# Patient Record
Sex: Male | Born: 1965 | Race: White | Hispanic: No | Marital: Married | Smoking: Never smoker
Health system: Southern US, Community
[De-identification: ages and names within clinical notes are randomized; demographics above are authoritative.]

## PROBLEM LIST (undated history)

## (undated) DIAGNOSIS — R0600 Dyspnea, unspecified: Secondary | ICD-10-CM

## (undated) DIAGNOSIS — N186 End stage renal disease: Secondary | ICD-10-CM

## (undated) DIAGNOSIS — A0472 Enterocolitis due to Clostridium difficile, not specified as recurrent: Secondary | ICD-10-CM

## (undated) DIAGNOSIS — I509 Heart failure, unspecified: Secondary | ICD-10-CM

## (undated) DIAGNOSIS — K219 Gastro-esophageal reflux disease without esophagitis: Secondary | ICD-10-CM

## (undated) DIAGNOSIS — D649 Anemia, unspecified: Secondary | ICD-10-CM

## (undated) DIAGNOSIS — Z9289 Personal history of other medical treatment: Secondary | ICD-10-CM

## (undated) DIAGNOSIS — J189 Pneumonia, unspecified organism: Secondary | ICD-10-CM

## (undated) DIAGNOSIS — M009 Pyogenic arthritis, unspecified: Secondary | ICD-10-CM

## (undated) DIAGNOSIS — I82409 Acute embolism and thrombosis of unspecified deep veins of unspecified lower extremity: Secondary | ICD-10-CM

## (undated) HISTORY — DX: Enterocolitis due to Clostridium difficile, not specified as recurrent: A04.72

## (undated) HISTORY — PX: EYE SURGERY: SHX253

## (undated) HISTORY — DX: Gastro-esophageal reflux disease without esophagitis: K21.9

## (undated) HISTORY — DX: Acute embolism and thrombosis of unspecified deep veins of unspecified lower extremity: I82.409

## (undated) HISTORY — DX: Pyogenic arthritis, unspecified: M00.9

## (undated) HISTORY — PX: OTHER SURGICAL HISTORY: SHX169

## (undated) HISTORY — DX: Heart failure, unspecified: I50.9

---

## 1993-09-05 HISTORY — PX: APPENDECTOMY: SHX54

## 2008-04-18 DIAGNOSIS — A0472 Enterocolitis due to Clostridium difficile, not specified as recurrent: Secondary | ICD-10-CM

## 2008-04-18 HISTORY — DX: Enterocolitis due to Clostridium difficile, not specified as recurrent: A04.72

## 2008-10-15 ENCOUNTER — Ambulatory Visit: Payer: Self-pay | Admitting: Family Medicine

## 2010-10-05 NOTE — Assessment & Plan Note (Signed)
Summary: FLU SHOT/WB  Nurse Visit    Flu Vaccine Consent Questions     Do you have a history of severe allergic reactions to this vaccine? no    Any prior history of allergic reactions to egg and/or gelatin? no    Do you have a sensitivity to the preservative Thimersol? no    Do you have a past history of Guillan-Barre Syndrome? no    Do you currently have an acute febrile illness? no    Have you ever had a severe reaction to latex? no    Vaccine information given and explained to patient? yes    Are you currently pregnant? no    Lot Number:[id number]   Exp Date:03/04/2009   Site Given  Right Deltoid IM Georgiann Mccoy  October 15, 2008 3:14 PM          Orders Added: 1)  Admin 1st Vaccine [id number] 2)  Flu Vaccine 62yrs + [place].Seats    ]

## 2012-01-03 ENCOUNTER — Ambulatory Visit (INDEPENDENT_AMBULATORY_CARE_PROVIDER_SITE_OTHER): Payer: 59 | Admitting: Family Medicine

## 2012-01-03 ENCOUNTER — Encounter: Payer: Self-pay | Admitting: Family Medicine

## 2012-01-03 VITALS — BP 140/88 | HR 97 | Ht 71.0 in | Wt 201.0 lb

## 2012-01-03 DIAGNOSIS — I5022 Chronic systolic (congestive) heart failure: Secondary | ICD-10-CM | POA: Insufficient documentation

## 2012-01-03 DIAGNOSIS — E1129 Type 2 diabetes mellitus with other diabetic kidney complication: Secondary | ICD-10-CM | POA: Insufficient documentation

## 2012-01-03 DIAGNOSIS — L97509 Non-pressure chronic ulcer of other part of unspecified foot with unspecified severity: Secondary | ICD-10-CM

## 2012-01-03 DIAGNOSIS — E119 Type 2 diabetes mellitus without complications: Secondary | ICD-10-CM

## 2012-01-03 DIAGNOSIS — I509 Heart failure, unspecified: Secondary | ICD-10-CM

## 2012-01-03 LAB — POCT GLYCOSYLATED HEMOGLOBIN (HGB A1C): Hemoglobin A1C: >14

## 2012-01-03 MED ORDER — SULFAMETHOXAZOLE-TRIMETHOPRIM 800-160 MG PO TABS: 1.0000 | ORAL_TABLET | Freq: Two times a day (BID) | ORAL | Status: AC

## 2012-01-03 MED ORDER — INSULIN DETEMIR 100 UNIT/ML ~~LOC~~ SOLN: 20.0000 [IU] | Freq: Every day | SUBCUTANEOUS | Status: DC

## 2012-01-03 MED ORDER — INSULIN PEN NEEDLE 31G X 5 MM MISC: Status: DC

## 2012-01-03 MED ORDER — GLIPIZIDE 10 MG PO TABS: 10.0000 mg | ORAL_TABLET | Freq: Two times a day (BID) | ORAL | Status: DC

## 2012-01-03 MED ORDER — METFORMIN HCL 1000 MG PO TABS: 1000.0000 mg | ORAL_TABLET | Freq: Two times a day (BID) | ORAL | Status: DC

## 2012-01-03 MED ORDER — AMBULATORY NON FORMULARY MEDICATION: Status: DC

## 2012-01-03 NOTE — Progress Notes (Signed)
Subjective:    Patient ID: James Burke, male    DOB: 27-Jul-1966, 46 y.o.   MRN: [id number]  HPI Wound on foot for about a month.  Found blood blister, not sure what caused it. Never hurt or bothered him. Then developed a scab, started to remove the scab and sawa a lot of discharge. Has been cleaning it and applying neosporin.  Then finally Sunday started to get painful.  Yesterday severe pain.  Hx of DM dx in 2007.  Glipizide and metformin for years.  Off the meds for about a year. Says has tried to eat really well since being off the medications. He has not been checking his blood sugars. He denies any polyuria or polydipsia. No fever..    Review of Systems  Constitutional: Negative for fever, diaphoresis and unexpected weight change.  HENT: Negative for hearing loss, rhinorrhea, sneezing and tinnitus.   Eyes: Negative for visual disturbance.  Respiratory: Negative for cough and wheezing.   Cardiovascular: Negative for chest pain and palpitations.  Gastrointestinal: Negative for nausea, vomiting, diarrhea and blood in stool.  Genitourinary: Negative for dysuria and discharge.  Musculoskeletal: Negative for myalgias and arthralgias.  Skin: Negative for rash.  Neurological: Negative for headaches.  Hematological: Negative for adenopathy.  Psychiatric/Behavioral: Negative for sleep disturbance and dysphoric mood. The patient is not nervous/anxious.    BP 140/88  Pulse 97  Ht 5\' 11"  (1.803 m)  Wt 201 lb (91.173 kg)  BMI 28.03 kg/m2    No Known Allergies  Past Medical History  Diagnosis Date  . Diabetes mellitus   . Congestive heart failure     Past Surgical History  Procedure Date  . Appendectomy 1995    History   Social History  . Marital Status: Married    Spouse Name: N/A    Number of Children: N/A  . Years of Education: N/A   Occupational History  . Not on file.   Social History Main Topics  . Smoking status: Never Smoker   . Smokeless tobacco: Not on file  .  Alcohol Use: No  . Drug Use: No  . Sexually Active: Yes   Other Topics Concern  . Not on file   Social History Narrative  . No narrative on file    History reviewed. No pertinent family history.  Outpatient Encounter Prescriptions as of 01/03/2012  Medication Sig Dispense Refill  . aspirin 81 MG tablet Take 81 mg by mouth daily.      . carvedilol (COREG) 25 MG tablet Take 25 mg by mouth. Take two pills twice daily      . lisinopril-hydrochlorothiazide (PRINZIDE,ZESTORETIC) 20-12.5 MG per tablet Take 1 tablet by mouth daily.      . AMBULATORY NON FORMULARY MEDICATION Medication Name: glucometer, strips and lancet Dx 250.02 Test twice a day.  100 Units  pRN  . glipiZIDE (GLUCOTROL) 10 MG tablet Take 1 tablet (10 mg total) by mouth 2 (two) times daily before a meal.  90 tablet  0  . insulin detemir (LEVEMIR) 100 UNIT/ML injection Inject 20 Units into the skin at bedtime.  9 mL  12  . Insulin Pen Needle 31G X 5 MM MISC Needles for levemir.  100 each  11  . metFORMIN (GLUCOPHAGE) 1000 MG tablet Take 1 tablet (1,000 mg total) by mouth 2 (two) times daily with a meal.  180 tablet  0  . sulfamethoxazole-trimethoprim (BACTRIM DS,SEPTRA DS) 800-160 MG per tablet Take 1 tablet by mouth 2 (two) times daily.  28 tablet  0          Objective:   Physical Exam  Constitutional: He is oriented to person, place, and time. He appears well-developed and well-nourished.  HENT:  Head: Normocephalic and atraumatic.  Cardiovascular: Normal rate, regular rhythm and normal heart sounds.   Pulmonary/Chest: Effort normal and breath sounds normal.  Neurological: He is alert and oriented to person, place, and time.  Skin: Skin is warm and dry.       Left heal with a a very larger callus.  approx 6 cm large.  I debrided the area and underneath with soft white pale skin.  In the center is 8 mm ulcer that is about 3 mm deep.  Unable to express any pus but it does have a moist looks.  AFter debridement duoderm  was applied and then coban to keep it in place.  He also has some surrounding erythema around the callus. It is still warm to touch.  Psychiatric: He has a normal mood and affect. His behavior is normal.          Assessment & Plan:  Foot ulcer- I apply DuoDERM to the wound after debridement. Patient tolerated this well. I'll have him followup in 3 days to make sure that it's improving. I also put him on Bactrim since there was surrounding erythema with increased warmth around the wound. Consider that this could be tracking to the bone and if it is not getting better, or certainly if he is getting worse, or if pain worsens then consider further imaging to rule out osteomyelitis. Right now the depth of the ulcer is only about 3 mm. I encouraged him to stay off the foot as much as possible. He declined crutches.  DM-uncontrolled. We discussed that an A1c over 14 mean sneezing running over 300 on a daily basis. We will restart his abdomen as well as his glipizide. I also started him on Levemir. I gave him a sample pen and demonstrated how to use it properly. He administered his first 10 units here in the office today. I will give him a prescription for glucometer, lancets, strips, and needles for his pain. He will followup on Friday. I did ask him to increase by 1 unit today until his fasting sugars in the morning are under 130. He is to administer his Levemir at bedtime. Lab Results  Component Value Date   HGBA1C >14 01/03/2012

## 2012-01-03 NOTE — Patient Instructions (Addendum)
Increase Lantus by one unit today and telling her morning sugar is under 130. Tomorrow you will give yourselt 11 units.  Keep wrapping dry. Followup on Friday for me to recheck your wound.

## 2012-01-06 ENCOUNTER — Encounter: Payer: Self-pay | Admitting: Family Medicine

## 2012-01-06 ENCOUNTER — Ambulatory Visit (INDEPENDENT_AMBULATORY_CARE_PROVIDER_SITE_OTHER): Payer: 59 | Admitting: Family Medicine

## 2012-01-06 VITALS — BP 120/78 | HR 87 | Ht 71.0 in | Wt 203.0 lb

## 2012-01-06 DIAGNOSIS — L97509 Non-pressure chronic ulcer of other part of unspecified foot with unspecified severity: Secondary | ICD-10-CM

## 2012-01-06 MED ORDER — INSULIN DETEMIR 100 UNIT/ML ~~LOC~~ SOLN: 20.0000 [IU] | Freq: Every day | SUBCUTANEOUS | Status: DC

## 2012-01-06 MED ORDER — CLINDAMYCIN HCL 150 MG PO CAPS: 150.0000 mg | ORAL_CAPSULE | Freq: Three times a day (TID) | ORAL | Status: AC

## 2012-01-06 NOTE — Progress Notes (Signed)
Addended by: [name] D on: 01/06/2012 05:28 PM   Modules accepted: Orders

## 2012-01-06 NOTE — Progress Notes (Addendum)
  Subjective:    Patient ID: Aylen Serritella, male    DOB: [birth year], 46 y.o.   MRN: [id number]  HPI Followup diabetic foot ulcer on the left heel. He says his pain is better. He's been taking his antibiotic consistently for the last 3 days. No fevers. He does want to know if the antibiotic I put him on can cause muscle and joint pain.   Review of Systems     Objective:   Physical Exam  Left heel ulcer is approx 0.8 0.5 cm in size.  Additional dead tissue debrided from the wound.  Duoderm placed again and Coban used to wrap the foot to hold hte duoderm in place since on the heel.  Surrounding erythema is much improved though not completely resolved.       Assessment & Plan:  Foot ulcer- Depth is improving.  Recheck on Monday.   Cellulitis - much improved. Continue ABX.  I checked SE profile and joint aches are not listed. Continue for now and can change to doxy  if worsens. Will also add clindamycine to cover streptococci.

## 2012-01-06 NOTE — Progress Notes (Signed)
Addended by: [name] D on: 01/06/2012 05:24 PM   Modules accepted: Orders

## 2012-01-09 ENCOUNTER — Ambulatory Visit: Payer: 59 | Admitting: Family Medicine

## 2012-01-10 ENCOUNTER — Encounter: Payer: Self-pay | Admitting: Family Medicine

## 2012-01-10 ENCOUNTER — Ambulatory Visit (INDEPENDENT_AMBULATORY_CARE_PROVIDER_SITE_OTHER): Payer: 59 | Admitting: Family Medicine

## 2012-01-10 VITALS — BP 117/72 | HR 91 | Ht 71.0 in | Wt 205.0 lb

## 2012-01-10 DIAGNOSIS — L8992 Pressure ulcer of unspecified site, stage 2: Secondary | ICD-10-CM

## 2012-01-10 DIAGNOSIS — IMO0001 Reserved for inherently not codable concepts without codable children: Secondary | ICD-10-CM

## 2012-01-10 DIAGNOSIS — L89609 Pressure ulcer of unspecified heel, unspecified stage: Secondary | ICD-10-CM

## 2012-01-10 NOTE — Progress Notes (Signed)
  Subjective:    Patient ID: James Burke, male    DOB: [birth year], 46 y.o.   MRN: [id number]  HPI Here for wound recheck. He has an ulcer on his heel. We have placed her at her last Friday he was postictal yesterday was unable to make it and so made an appointment today. He is trying to keep off of it as much as possible. He said that there was some drainage that actually came through the dressing. He denies any fever. He is taking the antibiotics.  Diabetes-he got up to 14 units on his Levemir and the following day had a load in the afternoon. He exercised breakout into a sweat. He drank some orange juice and ate a cookie and says within about 15 minutes he felt much better. The next night he skipped his Lantus completely and the following morning his sugar was 150. He did restart it last night at 13 units and his sugar was 109 this morning. He says he's had some abnormal vision changes on off for the last few days and starting the insulin. He says he did not skip a meal when he had the low in the afternoon.   Review of Systems     Objective:   Physical Exam  Constitutional: He appears well-developed and well-nourished.  Skin:       The heel looks much improved. The ulcer is almost flat to the surface but it is still open and approximately 0.8 cm in size. This is much more superficial now. I did clean the wound with antibacterial soap and dried it and apply another pad of DuoDERM. Coumadin was placed to keep the DuoDERM in place. In 2 days I want him to remove the DuoDERM and take a shower pat the area dry  and replace the DuoDERM. He will followup on Friday before the weekend so that we can recheck.          Assessment & Plan:  Heel ulcer, stage II-  In 2 days I want him to remove the DuoDERM and take a shower pat the area dry  and replace the DuoDERM. He will followup on Friday before the weekend so that we can recheck.  DM-I encouraged him to stay at over 13 units to help maintain a  normal blood sugar.  I told him that it may take a few days for his vision to readjust because the sugars go up and down it does affect swelling and fluid in the cornea which does affect vision.  Rec make an eye appt.

## 2012-01-12 ENCOUNTER — Encounter: Payer: Self-pay | Admitting: Family Medicine

## 2012-01-12 ENCOUNTER — Telehealth: Payer: Self-pay | Admitting: Family Medicine

## 2012-01-12 DIAGNOSIS — I10 Essential (primary) hypertension: Secondary | ICD-10-CM | POA: Insufficient documentation

## 2012-01-12 DIAGNOSIS — R945 Abnormal results of liver function studies: Secondary | ICD-10-CM

## 2012-01-12 DIAGNOSIS — E11319 Type 2 diabetes mellitus with unspecified diabetic retinopathy without macular edema: Secondary | ICD-10-CM | POA: Insufficient documentation

## 2012-01-12 DIAGNOSIS — D126 Benign neoplasm of colon, unspecified: Secondary | ICD-10-CM

## 2012-01-12 DIAGNOSIS — E781 Pure hyperglyceridemia: Secondary | ICD-10-CM | POA: Insufficient documentation

## 2012-01-12 DIAGNOSIS — R7989 Other specified abnormal findings of blood chemistry: Secondary | ICD-10-CM | POA: Insufficient documentation

## 2012-01-12 NOTE — Telephone Encounter (Signed)
I went through his old records. Had colonoscopy in 05/2008. They wanted him to repeat in 3 years.  Did he have it done last year? If not then we need to get im scheduled. Does he want to use Dr. Shana Chute again at [facility]?

## 2012-01-13 ENCOUNTER — Telehealth: Payer: Self-pay | Admitting: Family Medicine

## 2012-01-13 ENCOUNTER — Ambulatory Visit: Payer: 59 | Admitting: Family Medicine

## 2012-01-13 NOTE — Telephone Encounter (Signed)
Pt states you can setup the colonoscopy with whom you prefer. He didn't have one done last year. He also states to let you know that he cancelled his appt today due to being out of town due to step daughter passing away. He states he took his bandage off and washed the heel area and rewrapped it. States he is able to wear a shoe today.

## 2012-01-13 NOTE — Telephone Encounter (Signed)
OK, referral placed

## 2012-01-13 NOTE — Telephone Encounter (Signed)
LMOM fopr pt to return call.

## 2012-01-13 NOTE — Telephone Encounter (Signed)
Patient called cancelled appt wife's family member just passed away..Patient advised that he washed his womb and it is healing and feeling better.He just wanted to let you know

## 2012-02-27 ENCOUNTER — Other Ambulatory Visit: Payer: Self-pay | Admitting: *Deleted

## 2012-02-27 MED ORDER — GLIPIZIDE 10 MG PO TABS: 10.0000 mg | ORAL_TABLET | Freq: Two times a day (BID) | ORAL | Status: DC

## 2012-03-07 ENCOUNTER — Other Ambulatory Visit: Payer: Self-pay | Admitting: Family Medicine

## 2012-04-12 ENCOUNTER — Other Ambulatory Visit: Payer: Self-pay | Admitting: Family Medicine

## 2012-06-01 ENCOUNTER — Other Ambulatory Visit: Payer: Self-pay | Admitting: Family Medicine

## 2012-06-01 NOTE — Telephone Encounter (Signed)
Needs appt before future refills

## 2012-06-11 ENCOUNTER — Other Ambulatory Visit: Payer: Self-pay | Admitting: *Deleted

## 2012-06-11 MED ORDER — METFORMIN HCL 1000 MG PO TABS: 1000.0000 mg | ORAL_TABLET | Freq: Two times a day (BID) | ORAL | Status: DC

## 2012-06-20 ENCOUNTER — Ambulatory Visit: Payer: 59 | Admitting: Family Medicine

## 2012-06-27 ENCOUNTER — Ambulatory Visit: Payer: 59 | Admitting: Family Medicine

## 2012-06-29 ENCOUNTER — Telehealth: Payer: Self-pay | Admitting: Physician Assistant

## 2012-06-29 NOTE — Telephone Encounter (Signed)
Call patient and let him know we would like to follow up on diabetes. He also needs some blood work to check cholesterol. Remind him of the importance of eye exam yearly with diabetes.

## 2012-06-29 NOTE — Telephone Encounter (Signed)
Pt aware and has apt scheduled.

## 2012-07-03 ENCOUNTER — Other Ambulatory Visit: Payer: Self-pay | Admitting: *Deleted

## 2012-07-03 MED ORDER — GLIPIZIDE 10 MG PO TABS: 10.0000 mg | ORAL_TABLET | Freq: Two times a day (BID) | ORAL | Status: DC

## 2012-07-11 ENCOUNTER — Encounter: Payer: Self-pay | Admitting: Family Medicine

## 2012-07-11 ENCOUNTER — Ambulatory Visit (INDEPENDENT_AMBULATORY_CARE_PROVIDER_SITE_OTHER): Payer: BC Managed Care – PPO | Admitting: Family Medicine

## 2012-07-11 VITALS — BP 132/81 | HR 85 | Ht 72.0 in | Wt 209.0 lb

## 2012-07-11 DIAGNOSIS — E119 Type 2 diabetes mellitus without complications: Secondary | ICD-10-CM

## 2012-07-11 DIAGNOSIS — I10 Essential (primary) hypertension: Secondary | ICD-10-CM

## 2012-07-11 LAB — POCT GLYCOSYLATED HEMOGLOBIN (HGB A1C): Hemoglobin A1C: 6.9

## 2012-07-11 MED ORDER — METFORMIN HCL 1000 MG PO TABS: 1000.0000 mg | ORAL_TABLET | Freq: Two times a day (BID) | ORAL | Status: DC

## 2012-07-11 NOTE — Progress Notes (Signed)
  Subjective:    Patient ID: James Burke, male    DOB: [birth year], 46 y.o.   MRN: [id number]  HPI DM- Sugars have been under 100. Quit the insulin right after filled the rx.  Was waking up in the 60s at night. Still taking metformin and glipizide.  No S.E.  No cuts or sores that are not healing well.  No CP or SOB.    Review of Systems     Objective:   Physical Exam  Constitutional: He is oriented to person, place, and time. He appears well-developed and well-nourished.  HENT:  Head: Normocephalic and atraumatic.  Neck: Neck supple. No thyromegaly present.  Cardiovascular: Normal rate, regular rhythm and normal heart sounds.   Pulmonary/Chest: Effort normal and breath sounds normal.  Lymphadenopathy:    He has no cervical adenopathy.  Neurological: He is alert and oriented to person, place, and time.  Skin: Skin is warm and dry.  Psychiatric: He has a normal mood and affect. His behavior is normal.          Assessment & Plan:  DM- Doing well overall.   Lab Results  Component Value Date   HGBA1C 6.9 07/11/2012   HTN - Well controlled.

## 2012-07-13 ENCOUNTER — Other Ambulatory Visit: Payer: Self-pay | Admitting: *Deleted

## 2012-10-10 ENCOUNTER — Ambulatory Visit: Payer: BC Managed Care – PPO | Admitting: Family Medicine

## 2012-10-10 ENCOUNTER — Other Ambulatory Visit: Payer: Self-pay | Admitting: Family Medicine

## 2012-10-31 ENCOUNTER — Ambulatory Visit: Payer: BC Managed Care – PPO | Admitting: Family Medicine

## 2012-11-07 ENCOUNTER — Encounter: Payer: Self-pay | Admitting: Family Medicine

## 2012-11-07 ENCOUNTER — Ambulatory Visit (INDEPENDENT_AMBULATORY_CARE_PROVIDER_SITE_OTHER): Payer: BC Managed Care – PPO | Admitting: Family Medicine

## 2012-11-07 VITALS — BP 142/89 | HR 88 | Ht 72.0 in | Wt 221.0 lb

## 2012-11-07 DIAGNOSIS — E119 Type 2 diabetes mellitus without complications: Secondary | ICD-10-CM

## 2012-11-07 DIAGNOSIS — I10 Essential (primary) hypertension: Secondary | ICD-10-CM

## 2012-11-07 DIAGNOSIS — K117 Disturbances of salivary secretion: Secondary | ICD-10-CM

## 2012-11-07 DIAGNOSIS — R682 Dry mouth, unspecified: Secondary | ICD-10-CM

## 2012-11-07 DIAGNOSIS — B079 Viral wart, unspecified: Secondary | ICD-10-CM

## 2012-11-07 LAB — POCT GLYCOSYLATED HEMOGLOBIN (HGB A1C): Hemoglobin A1C: 7.8

## 2012-11-07 NOTE — Progress Notes (Signed)
Subjective:    Patient ID: James Burke, male    DOB: Oct 29, 1965, 47 y.o.   MRN: [id number]  HPI DM- Says has not been eating well. No cuts ore sores that aren't healing well.  No hypoglycemic events. No regular exercise. He can come as 20 pounds. He really knows he has not been doing well with controlling his sugars.  HTN -  Pt denies chest pain, SOB, dizziness, or heart palpitations.  Taking meds as directed w/o problems.  Denies medication side effects.    Wart on the dorsum of his right hand-it has been there for months and has not gone away. He's tried some over-the-counter treatments. He would like to have the cryotherapy performed.  Dry mouth-he says it mostly bothers him at night. He will wake up and feel like he can't even swallow because his mouth is so dry. He is to keep a bottle water on his nightstand.  Review of Systems     Objective:   Physical Exam  Constitutional: He is oriented to person, place, and time. He appears well-developed and well-nourished.  HENT:  Head: Normocephalic and atraumatic.  Cardiovascular: Normal rate, regular rhythm and normal heart sounds.   Pulmonary/Chest: Effort normal and breath sounds normal.  Neurological: He is alert and oriented to person, place, and time.  Skin: Skin is warm and dry.  Wart on the dorsum of the right lateral hand.   Psychiatric: He has a normal mood and affect. His behavior is normal.          Assessment & Plan:  DM- Uncontrolled.  Discussed options. He says really wants to work on diet and exercise before we adjust his medications. He says he is really committed to making some changes.  F/uin 3 months. Reminded him to get eye exam.  Lab Results  Component Value Date   HGBA1C 7.8 11/07/2012    HTN - Uncontrolled. Work on [employer] and diet. Has gained about 20 lbs. Says wants to work on it before adjusting meds.  Make sure following low salt diet. F/U in 3 months.   WArt -discussed treatment options. cryotherapy  performed. Patient tolerated well.  Dry mouth-at this point in time I has to stop his HCTZ because of his history of congestive heart failure. Encouraged him to get him adipose in his nightstand help humidify the air this should help with dryness. In addition to get his blood sugars back under control. Explained her that sometimes when sugars are running high the body diuresis and he actually becomes mildly dehydrated. Encouraged plenty of water.  Cryotherapy Procedure Note  Pre-operative Diagnosis: wart  Post-operative Diagnosis: wart  Locations: dodorsum right hands  Indications: painful  Anesthesia: not required    Procedure Details  Patient informed of risks (permanent scarring, infection, light or dark discoloration, bleeding, infection, weakness, numbness and recurrence of the lesion) and benefits of the procedure and verbal informed consent obtained.  The areas are treated with liquid nitrogen therapy, frozen until ice ball extended 68mm beyond lesion, allowed to thaw, and treated again. The patient tolerated procedure well.  The patient was instructed on post-op care, warned that there may be blister formation, redness and pain. Recommend OTC analgesia as needed for pain.  Condition: Stable  Complications: none.  Plan: 1. Instructed to keep the area dry and covered for 24-48h and clean thereafter. 2. Warning signs of infection were reviewed.   3. Recommended that the patient use OTC acetaminophen and OTC analgesics as needed for pain.  4. Return in 2 weeks if not resolving for repeat cryotherapy.

## 2012-11-07 NOTE — Patient Instructions (Signed)
Try to get your eye exam.

## 2012-12-04 ENCOUNTER — Other Ambulatory Visit: Payer: Self-pay | Admitting: Family Medicine

## 2012-12-19 ENCOUNTER — Other Ambulatory Visit: Payer: Self-pay | Admitting: Family Medicine

## 2013-01-19 ENCOUNTER — Other Ambulatory Visit: Payer: Self-pay | Admitting: Family Medicine

## 2013-02-13 ENCOUNTER — Ambulatory Visit: Payer: BC Managed Care – PPO | Admitting: Family Medicine

## 2013-03-14 ENCOUNTER — Other Ambulatory Visit: Payer: Self-pay

## 2014-03-05 ENCOUNTER — Ambulatory Visit: Payer: BC Managed Care – PPO

## 2014-03-05 ENCOUNTER — Ambulatory Visit (INDEPENDENT_AMBULATORY_CARE_PROVIDER_SITE_OTHER): Payer: BC Managed Care – PPO | Admitting: Sports Medicine

## 2014-03-05 ENCOUNTER — Ambulatory Visit (INDEPENDENT_AMBULATORY_CARE_PROVIDER_SITE_OTHER): Payer: BC Managed Care – PPO

## 2014-03-05 ENCOUNTER — Encounter: Payer: Self-pay | Admitting: Sports Medicine

## 2014-03-05 ENCOUNTER — Telehealth: Payer: Self-pay

## 2014-03-05 VITALS — BP 125/81 | HR 98 | Ht 72.0 in | Wt 188.0 lb

## 2014-03-05 DIAGNOSIS — R102 Pelvic and perineal pain: Secondary | ICD-10-CM | POA: Insufficient documentation

## 2014-03-05 DIAGNOSIS — R1030 Lower abdominal pain, unspecified: Secondary | ICD-10-CM

## 2014-03-05 DIAGNOSIS — E119 Type 2 diabetes mellitus without complications: Secondary | ICD-10-CM

## 2014-03-05 DIAGNOSIS — R1024 Suprapubic pain: Secondary | ICD-10-CM | POA: Insufficient documentation

## 2014-03-05 DIAGNOSIS — I517 Cardiomegaly: Secondary | ICD-10-CM

## 2014-03-05 DIAGNOSIS — R109 Unspecified abdominal pain: Secondary | ICD-10-CM

## 2014-03-05 LAB — POCT URINALYSIS DIPSTICK
Bilirubin, UA: NEGATIVE
Glucose, UA: 500
Ketones, UA: NEGATIVE
Nitrite, UA: NEGATIVE
Spec Grav, UA: 1.025
Urobilinogen, UA: 0.2
pH, UA: 6.5

## 2014-03-05 LAB — HEMOGLOBIN A1C
Hgb A1c MFr Bld: 15.1 % — ABNORMAL HIGH (ref <5.7)
Mean Plasma Glucose: 387 mg/dL — ABNORMAL HIGH (ref <117)

## 2014-03-05 LAB — CBC
HCT: 34.5 % — ABNORMAL LOW (ref 39.0–52.0)
Hemoglobin: 11.4 g/dL — ABNORMAL LOW (ref 13.0–17.0)
MCH: 26.7 pg (ref 26.0–34.0)
MCHC: 33 g/dL (ref 30.0–36.0)
MCV: 80.8 fL (ref 78.0–100.0)
Platelets: 243 10*3/uL (ref 150–400)
RBC: 4.27 MIL/uL (ref 4.22–5.81)
RDW: 12.8 % (ref 11.5–15.5)
WBC: 13.1 10*3/uL — ABNORMAL HIGH (ref 4.0–10.5)

## 2014-03-05 LAB — POCT GLYCOSYLATED HEMOGLOBIN (HGB A1C): Hemoglobin A1C: >14

## 2014-03-05 MED ORDER — SENNOSIDES-DOCUSATE SODIUM 8.6-50 MG PO TABS: 2.0000 | ORAL_TABLET | Freq: Two times a day (BID) | ORAL | Status: DC

## 2014-03-05 MED ORDER — GLIPIZIDE 10 MG PO TABS: 10.0000 mg | ORAL_TABLET | Freq: Two times a day (BID) | ORAL | Status: DC

## 2014-03-05 MED ORDER — PHENAZOPYRIDINE HCL 200 MG PO TABS
200.0000 mg | ORAL_TABLET | Freq: Three times a day (TID) | ORAL | Status: AC
Start: 2014-03-05 — End: 2014-03-07

## 2014-03-05 MED ORDER — METFORMIN HCL 1000 MG PO TABS: 1000.0000 mg | ORAL_TABLET | Freq: Two times a day (BID) | ORAL | Status: DC

## 2014-03-05 MED ORDER — POLYETHYLENE GLYCOL 3350 17 G PO PACK: 17.0000 g | PACK | Freq: Two times a day (BID) | ORAL | Status: DC

## 2014-03-05 MED ORDER — TAMSULOSIN HCL 0.4 MG PO CAPS: 0.4000 mg | ORAL_CAPSULE | Freq: Every day | ORAL | Status: DC

## 2014-03-05 MED ORDER — CIPROFLOXACIN HCL 750 MG PO TABS: 750.0000 mg | ORAL_TABLET | Freq: Two times a day (BID) | ORAL | Status: DC

## 2014-03-05 NOTE — Telephone Encounter (Signed)
Patient stated that he was supposed to get a Rx for Flomax sent to his pharmacy. Patient went to pharmacy but it was not called in. .Please advise James Burke. Rhonda Cunningham,CMA

## 2014-03-05 NOTE — Progress Notes (Signed)
  Subjective:    CC: Difficulty urinating  HPI: This is a pleasant 48 year old male with a history of diabetes mellitus type 2 which was previously well controlled. Unfortunately he self discontinued both his metformin and glipizide, and over the past several weeks has developed increasing difficulty urination, as well as significant constipation, suprapubic and left costovertebral abdominal pain. Symptoms are moderate, persistent, no constitutional symptoms, trauma. Symptoms are moderate, persistent.    Past medical history, Surgical history, Family history not pertinant except as noted below, Social history, Allergies, and medications have been entered into the medical record, reviewed, and no changes needed.   Review of Systems: No fevers, chills, night sweats, weight loss, chest pain, or shortness of breath.   Objective:    General: Well Developed, well nourished, and in no acute distress.  Neuro: Alert and oriented x3, extra-ocular muscles intact, sensation grossly intact.  HEENT: Normocephalic, atraumatic, pupils equal round reactive to light, neck supple, no masses, no lymphadenopathy, thyroid nonpalpable.  Skin: Warm and dry, no rashes. Cardiac: Regular rate and rhythm, no murmurs rubs or gallops, no lower extremity edema.  Respiratory: Clear to auscultation bilaterally. Not using accessory muscles, speaking in full sentences. Abdomen: Soft, tender to palpation in the suprapubic region, also with significant tenderness to palpation in the left costovertebral angle, he does have some rigidity and guarding in the lower abdomen.   Hemoglobin A1c is off the charts at >14, urinalysis shows blood and pyuria.  Impression and Recommendations:

## 2014-03-05 NOTE — Assessment & Plan Note (Addendum)
Acute, suprapubic, with costovertebral angle tenderness on the left and prostate tenderness. This likely represents an acute pyelonephritis with prostatitis. Urinalysis does show pyuria. Considering acuity and severity of abdominal pain with guarding we are going to obtain a CT scan of the abdomen and pelvis with oral contrast. Adding ciprofloxacin for 14 days, urine culture, Pyridium.  CT shows nothing surgical, there are signs of cystitis, there is also a significant amount of stool throughout the colon, adding MiraLax and Senokot S.

## 2014-03-05 NOTE — Telephone Encounter (Signed)
Flomax added.

## 2014-03-05 NOTE — Assessment & Plan Note (Addendum)
Patient self discontinued glipizide and metformin, hemoglobin A1c is greater than 14. Checking some blood work down stairs. Advised to restart medications.

## 2014-03-05 NOTE — Addendum Note (Signed)
Addended by: Silverio Decamp on: 03/05/2014 02:51 PM   Modules accepted: Orders

## 2014-03-06 LAB — COMPREHENSIVE METABOLIC PANEL
ALT: 13 U/L (ref 0–53)
AST: 11 U/L (ref 0–37)
Albumin: 3.1 g/dL — ABNORMAL LOW (ref 3.5–5.2)
Alkaline Phosphatase: 87 U/L (ref 39–117)
BUN: 43 mg/dL — ABNORMAL HIGH (ref 6–23)
CO2: 25 mEq/L (ref 19–32)
Calcium: 9 mg/dL (ref 8.4–10.5)
Chloride: 93 mEq/L — ABNORMAL LOW (ref 96–112)
Creat: 2.59 mg/dL — ABNORMAL HIGH (ref 0.50–1.35)
Glucose, Bld: 517 mg/dL (ref 70–99)
Potassium: 4.1 mEq/L (ref 3.5–5.3)
Sodium: 134 mEq/L — ABNORMAL LOW (ref 135–145)
Total Bilirubin: 0.4 mg/dL (ref 0.2–1.2)
Total Protein: 6.4 g/dL (ref 6.0–8.3)

## 2014-03-06 NOTE — Addendum Note (Signed)
Addended by: Silverio Decamp on: 03/06/2014 08:43 AM   Modules accepted: Orders

## 2014-03-09 LAB — URINE CULTURE: Colony Count: >=100000

## 2014-03-11 LAB — BASIC METABOLIC PANEL
BUN: 42 mg/dL — ABNORMAL HIGH (ref 6–23)
CO2: 29 mEq/L (ref 19–32)
Calcium: 9.2 mg/dL (ref 8.4–10.5)
Chloride: 95 mEq/L — ABNORMAL LOW (ref 96–112)
Creat: 2.52 mg/dL — ABNORMAL HIGH (ref 0.50–1.35)
Glucose, Bld: 275 mg/dL — ABNORMAL HIGH (ref 70–99)
Potassium: 4.2 mEq/L (ref 3.5–5.3)
Sodium: 134 mEq/L — ABNORMAL LOW (ref 135–145)

## 2014-03-12 ENCOUNTER — Telehealth: Payer: Self-pay | Admitting: Sports Medicine

## 2014-03-12 DIAGNOSIS — N184 Chronic kidney disease, stage 4 (severe): Secondary | ICD-10-CM | POA: Insufficient documentation

## 2014-03-12 DIAGNOSIS — E1122 Type 2 diabetes mellitus with diabetic chronic kidney disease: Secondary | ICD-10-CM | POA: Insufficient documentation

## 2014-03-12 DIAGNOSIS — N289 Disorder of kidney and ureter, unspecified: Secondary | ICD-10-CM

## 2014-03-12 NOTE — Telephone Encounter (Signed)
Discussed symptoms with patient, pain has improved significantly. Unfortunately recent creatinine is approximately the same. He is producing urine but very little. Electrolytes have remained normal. He continues to stay off of his metformin and his blood sugars have improved significantly with glipizide. I'm going to refer him to nephrology.

## 2014-03-12 NOTE — Assessment & Plan Note (Signed)
Referral to nephrology. I do think that renal insufficiency likely represents acute tubular necrosis and will resolve.

## 2014-03-13 ENCOUNTER — Ambulatory Visit (INDEPENDENT_AMBULATORY_CARE_PROVIDER_SITE_OTHER): Payer: BC Managed Care – PPO | Admitting: Family Medicine

## 2014-03-13 ENCOUNTER — Encounter: Payer: Self-pay | Admitting: Family Medicine

## 2014-03-13 VITALS — BP 126/74 | HR 85 | Ht 72.0 in | Wt 194.0 lb

## 2014-03-13 DIAGNOSIS — E785 Hyperlipidemia, unspecified: Secondary | ICD-10-CM

## 2014-03-13 DIAGNOSIS — N183 Chronic kidney disease, stage 3 unspecified: Secondary | ICD-10-CM

## 2014-03-13 DIAGNOSIS — I10 Essential (primary) hypertension: Secondary | ICD-10-CM

## 2014-03-13 DIAGNOSIS — E1122 Type 2 diabetes mellitus with diabetic chronic kidney disease: Secondary | ICD-10-CM

## 2014-03-13 DIAGNOSIS — E119 Type 2 diabetes mellitus without complications: Secondary | ICD-10-CM

## 2014-03-13 DIAGNOSIS — E1129 Type 2 diabetes mellitus with other diabetic kidney complication: Secondary | ICD-10-CM

## 2014-03-13 DIAGNOSIS — M25511 Pain in right shoulder: Secondary | ICD-10-CM

## 2014-03-13 DIAGNOSIS — N289 Disorder of kidney and ureter, unspecified: Secondary | ICD-10-CM

## 2014-03-13 DIAGNOSIS — L80 Vitiligo: Secondary | ICD-10-CM | POA: Insufficient documentation

## 2014-03-13 DIAGNOSIS — M25519 Pain in unspecified shoulder: Secondary | ICD-10-CM

## 2014-03-13 LAB — POCT UA - MICROALBUMIN
Albumin/Creatinine Ratio, Urine, POC: >300
Creatinine, POC: 50 mg/dL
Microalbumin Ur, POC: 150 mg/L

## 2014-03-13 MED ORDER — INSULIN GLARGINE 300 UNIT/ML ~~LOC~~ SOPN: 35.0000 [IU] | PEN_INJECTOR | Freq: Every day | SUBCUTANEOUS | Status: DC

## 2014-03-13 NOTE — Patient Instructions (Addendum)
Complete Cipro.  Increase the Toujeo by one unit each night until sugar in AM is under 130.

## 2014-03-13 NOTE — Progress Notes (Signed)
Subjective:    Patient ID: James Burke, male    DOB: 1965/11/27, 48 y.o.   MRN: [id number]  HPI Diabetes  - restarted glipizide last week.  Sugars running 200-400s. Had to hold metformin because of renal function. He says his been trying to drink at least a gallon water today. He notices that his urine is much more clear. He said previously he was actually passing a lot of sediment. Unfortunately his lab work showed that he was in acute renal failure. Lab Results  Component Value Date   HGBA1C >14.0 03/05/2014    UTI - doing well on Cipro. Says his pain is much better.  Still drinking a lot of water. Still has a little mild back discomfort. He still has several more days left.  He fell in February, approximately 5 months ago, on ice. He hit his elbow and it jarred his shoulder. He really didn't have any major problems with his elbow. He was placed in a sling at the emergency department. He did not have any fractures et Ronney Asters. It he'll fairly well overall but he still has somewhat limited range of motion and has pain when he tries to reach above 90. Or if he tries to adduct the shoulder  He also has some white patches on his skin he would like me to look at today. It does not bother him it's not itchy. It has been there for the last 4-5 years but he's noticed some new lesions this year. No one in his family has a similar skin disorder.  Review of Systems  BP 126/74  Pulse 85  Ht 6' (1.829 m)  Wt 194 lb (87.998 kg)  BMI 26.31 kg/m2    Allergies  Allergen Reactions  . Prednisone     Other reaction(s): Other blindness  . Simvastatin Other (See Comments)    Joint aches.     Past Medical History  Diagnosis Date  . Diabetes mellitus   . Congestive heart failure   . C. difficile colitis 04/18/2008    Past Surgical History  Procedure Laterality Date  . Appendectomy  1995    History   Social History  . Marital Status: Married    Spouse Name: N/A    Number of Children: N/A   . Years of Education: N/A   Occupational History  . Not on file.   Social History Main Topics  . Smoking status: Never Smoker   . Smokeless tobacco: Not on file  . Alcohol Use: No  . Drug Use: No  . Sexual Activity: Yes   Other Topics Concern  . Not on file   Social History Narrative  . No narrative on file    No family history on file.  Outpatient Encounter Prescriptions as of 03/13/2014  Medication Sig  . AMBULATORY NON FORMULARY MEDICATION Medication Name: glucometer, strips and lancet Dx 250.02 Test twice a day.  Marland Kitchen aspirin 81 MG tablet Take 81 mg by mouth daily.  . carvedilol (COREG) 25 MG tablet Take 25 mg by mouth. Take two pills twice daily  . ciprofloxacin (CIPRO) 750 MG tablet Take 1 tablet (750 mg total) by mouth 2 (two) times daily.  Marland Kitchen lisinopril-hydrochlorothiazide (PRINZIDE,ZESTORETIC) 20-12.5 MG per tablet Take 1 tablet by mouth daily.  . polyethylene glycol (MIRALAX / GLYCOLAX) packet Take 17 g by mouth 2 (two) times daily. Until stooling regularly  . senna-docusate (SENOKOT-S) 8.6-50 MG per tablet Take 2 tablets by mouth 2 (two) times daily. Until stooling regularly  .  tamsulosin (FLOMAX) 0.4 MG CAPS capsule Take 1 capsule (0.4 mg total) by mouth daily after supper.  Marland Kitchen glipiZIDE (GLUCOTROL) 10 MG tablet Take 1 tablet (10 mg total) by mouth 2 (two) times daily before a meal.  . Insulin Glargine (TOUJEO SOLOSTAR) 300 UNIT/ML SOPN Inject 35 Units into the skin at bedtime.  . metFORMIN (GLUCOPHAGE) 1000 MG tablet Take 1 tablet (1,000 mg total) by mouth 2 (two) times daily with a meal.          Objective:   Physical Exam  Constitutional: He is oriented to person, place, and time. He appears well-developed and well-nourished.  HENT:  Head: Normocephalic and atraumatic.  Cardiovascular: Normal rate, regular rhythm and normal heart sounds.   Pulmonary/Chest: Effort normal and breath sounds normal.  Musculoskeletal:  Right shoulder with extension to about  120. He has discomfort past 90.  Neurological: He is alert and oriented to person, place, and time.  Skin: Skin is warm and dry.  He has several scattered hypopigmented patches on his chest, forearms, and lower legs. He also has some areas on the top of his head we had significant hair loss.  Psychiatric: He has a normal mood and affect. His behavior is normal.          Assessment & Plan:  DM - uncontrolled. Will add insulin to his regimen. We'll start with 15 units and increase by 1 unit today until his morning sugars are under 130. Diabetic foot exam performed today. Attempted to collect a urine microalbumin. He is due for CMP and lipid panel as well. Reminded to schedule a yearly eye exam.  Acute renal failure- due to recheck kidney function on Monday. Lab slip provided. Hopefully this is just acute renal insufficiency but it's unclear at this point what his baseline kidney function will be. We have placed referral to nephrology.  UTI - complete Cipro.    Right shoulder pain-most consistent with rotator cuff injury or tear. Will give him exercises do on his own home over the next month. At that point will followup in [place] do any further intervention pending on how well he is doing with the home therapy. I did not do a complete shoulder exam today as he mentioned this at the very end of the visit. Will address in further detail at next office visit.  Vitiligo-discussed diagnosis with him. Gave reassurance that this is a benign condition. Recommend using sunscreen and avoiding tanning.

## 2014-03-14 MED ORDER — INSULIN PEN NEEDLE 31G X 5 MM MISC: Status: DC

## 2014-03-14 NOTE — Addendum Note (Signed)
Addended by: Teddy Spike on: 03/14/2014 09:35 AM   Modules accepted: Orders

## 2014-03-19 LAB — BASIC METABOLIC PANEL
BUN: 48 mg/dL — ABNORMAL HIGH (ref 6–23)
CO2: 26 mEq/L (ref 19–32)
Calcium: 9.5 mg/dL (ref 8.4–10.5)
Chloride: 99 mEq/L (ref 96–112)
Creat: 2.48 mg/dL — ABNORMAL HIGH (ref 0.50–1.35)
Glucose, Bld: 98 mg/dL (ref 70–99)
Potassium: 4.3 mEq/L (ref 3.5–5.3)
Sodium: 136 mEq/L (ref 135–145)

## 2014-03-19 LAB — LIPID PANEL
Cholesterol: 169 mg/dL (ref 0–200)
HDL: 27 mg/dL — ABNORMAL LOW (ref >39)
LDL Cholesterol: 105 mg/dL — ABNORMAL HIGH (ref 0–99)
Total CHOL/HDL Ratio: 6.3 Ratio
Triglycerides: 183 mg/dL — ABNORMAL HIGH (ref <150)
VLDL: 37 mg/dL (ref 0–40)

## 2014-03-24 ENCOUNTER — Telehealth: Payer: Self-pay | Admitting: Family Medicine

## 2014-03-24 DIAGNOSIS — N289 Disorder of kidney and ureter, unspecified: Secondary | ICD-10-CM

## 2014-03-24 NOTE — Telephone Encounter (Signed)
Re: Referral to [facility] Dr. Madilyn Fireman I received fax from [facility] and due to number of referrals they receive it may take as long as 3 to 4 months although they rated patient a 2 which is considered urgent.. The patient thinks this is too long. He also wants to know if needs to come in for lab this week. Sorry Dr Madilyn Fireman, I didn't read this properly a patient rated 3 or 4 it could take as long as 3 to 4 months. This patient is rated a 2. They are making every attempt to schedule him as quickly as possible.

## 2014-03-24 NOTE — Telephone Encounter (Signed)
Let recheck BMP and urine microalbumin/creatinine ratio at the end of the week.

## 2014-03-24 NOTE — Telephone Encounter (Signed)
Pt informed labs printed.James Burke [place]

## 2014-03-27 LAB — BASIC METABOLIC PANEL WITH GFR
BUN: 40 mg/dL — ABNORMAL HIGH (ref 6–23)
CO2: 28 mEq/L (ref 19–32)
Calcium: 8.9 mg/dL (ref 8.4–10.5)
Chloride: 101 mEq/L (ref 96–112)
Creat: 2.38 mg/dL — ABNORMAL HIGH (ref 0.50–1.35)
GFR, Est African American: 36 mL/min — ABNORMAL LOW
GFR, Est Non African American: 31 mL/min — ABNORMAL LOW
Glucose, Bld: 103 mg/dL — ABNORMAL HIGH (ref 70–99)
Potassium: 4.3 mEq/L (ref 3.5–5.3)
Sodium: 140 mEq/L (ref 135–145)

## 2014-03-28 ENCOUNTER — Encounter: Payer: Self-pay | Admitting: Family Medicine

## 2014-03-28 LAB — MICROALBUMIN / CREATININE URINE RATIO
Creatinine, Urine: 33.1 mg/dL
Microalb Creat Ratio: 3033.8 mg/g — ABNORMAL HIGH (ref 0.0–30.0)
Microalb, Ur: 100.42 mg/dL — ABNORMAL HIGH (ref 0.00–1.89)

## 2014-04-04 ENCOUNTER — Other Ambulatory Visit: Payer: Self-pay | Admitting: Family Medicine

## 2014-04-04 NOTE — Telephone Encounter (Signed)
Shea Evans called the office to check statis of this referral and also called then patient

## 2014-04-05 HISTORY — PX: KNEE SURGERY: SHX244

## 2014-04-16 ENCOUNTER — Other Ambulatory Visit: Payer: Self-pay | Admitting: Nephrology

## 2014-04-16 ENCOUNTER — Encounter: Payer: Self-pay | Admitting: Family Medicine

## 2014-04-16 ENCOUNTER — Ambulatory Visit (INDEPENDENT_AMBULATORY_CARE_PROVIDER_SITE_OTHER): Payer: BC Managed Care – PPO | Admitting: Family Medicine

## 2014-04-16 VITALS — BP 116/67 | HR 64 | Wt 216.0 lb

## 2014-04-16 DIAGNOSIS — IMO0001 Reserved for inherently not codable concepts without codable children: Secondary | ICD-10-CM

## 2014-04-16 DIAGNOSIS — E11621 Type 2 diabetes mellitus with foot ulcer: Secondary | ICD-10-CM

## 2014-04-16 DIAGNOSIS — N183 Chronic kidney disease, stage 3 unspecified: Secondary | ICD-10-CM

## 2014-04-16 DIAGNOSIS — S8992XS Unspecified injury of left lower leg, sequela: Secondary | ICD-10-CM

## 2014-04-16 DIAGNOSIS — E119 Type 2 diabetes mellitus without complications: Secondary | ICD-10-CM

## 2014-04-16 DIAGNOSIS — I10 Essential (primary) hypertension: Secondary | ICD-10-CM

## 2014-04-16 DIAGNOSIS — L97509 Non-pressure chronic ulcer of other part of unspecified foot with unspecified severity: Secondary | ICD-10-CM

## 2014-04-16 DIAGNOSIS — E1169 Type 2 diabetes mellitus with other specified complication: Secondary | ICD-10-CM

## 2014-04-16 DIAGNOSIS — L97529 Non-pressure chronic ulcer of other part of left foot with unspecified severity: Secondary | ICD-10-CM

## 2014-04-16 LAB — CMP12+7AC
Albumin: 2.9
Calcium: 9.2 mg/dL
Phosphorus: 3.9 mg/dL (ref 2.5–4.9)
Potassium: 4.4 mmol/L

## 2014-04-16 LAB — CBC AND DIFFERENTIAL
HCT: 20 % — AB (ref 41–53)
Hemoglobin: 6.7 g/dL — AB (ref 13.5–17.5)
Platelets: 520 10*3/uL — AB (ref 150–399)
WBC: 14.9 10^3/mL

## 2014-04-16 LAB — VITAMIN D 25 HYDROXY (VIT D DEFICIENCY, FRACTURES): Vit D, 25-Hydroxy: 33

## 2014-04-16 LAB — ESTIMATED GFR: EGFR: 26 mg/dL

## 2014-04-16 LAB — BASIC METABOLIC PANEL
BUN: 78 mg/dL — AB (ref 4–21)
Creatinine: 2.8 mg/dL — AB (ref 0.6–1.3)
Glucose: 194 mg/dL
Potassium: 4.4 mmol/L (ref 3.4–5.3)
Sodium: 133 mmol/L — AB (ref 137–147)

## 2014-04-16 NOTE — Progress Notes (Signed)
Subjective:    Patient ID: James Burke, male    DOB: 1965-10-30, 48 y.o.   MRN: [id number]  HPI Diabetes - no hypoglycemic events. No wounds or sores that are not healing well. No increased thirst or urination. Checking glucose at home. Taking medications as prescribed without any side effects. Got up to 22 units on the Tujeo so decreased dose and now on 14 units and Am glucose is 104.  Has been snacking more between meals.    Unfortunately since the last time I saw him he injured his knee at work. He felt a pop. Within a couple days and started swelling he went to the emergency department. He is now in a brace and has had an MRI. In fact he is to followup this afternoon with his orthopedist to discuss the MRI results.  He does have a spot on his left heel that he would like me to look at today. He has a prior history of a diabetic foot ulcer about a year or so ago when his blood sugars were really poorly controlled. He is worried he may be getting a new ulcer on his left heel.  Review of Systems  BP 116/67  Pulse 64  Wt 216 lb (97.977 kg)    Allergies  Allergen Reactions  . Prednisone     Other reaction(s): Other blindness  . Simvastatin Other (See Comments)    Joint aches.     Past Medical History  Diagnosis Date  . Diabetes mellitus   . Congestive heart failure   . C. difficile colitis 04/18/2008    Past Surgical History  Procedure Laterality Date  . Appendectomy  1995    History   Social History  . Marital Status: Married    Spouse Name: N/A    Number of Children: N/A  . Years of Education: N/A   Occupational History  . Not on file.   Social History Main Topics  . Smoking status: Never Smoker   . Smokeless tobacco: Not on file  . Alcohol Use: No  . Drug Use: No  . Sexual Activity: Yes   Other Topics Concern  . Not on file   Social History Narrative  . No narrative on file    No family history on file.  Outpatient Encounter Prescriptions as of  04/16/2014  Medication Sig  . AMBULATORY NON FORMULARY MEDICATION Medication Name: glucometer, strips and lancet Dx 250.02 Test twice a day.  Marland Kitchen aspirin 81 MG tablet Take 81 mg by mouth daily.  . carvedilol (COREG) 25 MG tablet Take 25 mg by mouth. Take two pills twice daily  . glipiZIDE (GLUCOTROL) 10 MG tablet TAKE 1 TABLET BY MOUTH TWICE DAILY PRIOR TO MEALS  . Insulin Pen Needle 31G X 5 MM MISC Needles for Toujeo.  Marland Kitchen lisinopril-hydrochlorothiazide (PRINZIDE,ZESTORETIC) 20-12.5 MG per tablet Take 1 tablet by mouth daily.  Marland Kitchen oxyCODONE-acetaminophen (PERCOCET) 10-325 MG per tablet Take 1 tablet by mouth every 6 (six) hours as needed for pain.  . polyethylene glycol (MIRALAX / GLYCOLAX) packet Take 17 g by mouth 2 (two) times daily. Until stooling regularly  . tamsulosin (FLOMAX) 0.4 MG CAPS capsule Take 1 capsule (0.4 mg total) by mouth daily after supper.  . [DISCONTINUED] ciprofloxacin (CIPRO) 750 MG tablet Take 1 tablet (750 mg total) by mouth 2 (two) times daily.  . [DISCONTINUED] glipiZIDE (GLUCOTROL) 10 MG tablet Take 1 tablet (10 mg total) by mouth 2 (two) times daily before a meal.  . [DISCONTINUED]  Insulin Glargine (TOUJEO SOLOSTAR) 300 UNIT/ML SOPN Inject 35 Units into the skin at bedtime.  . [DISCONTINUED] metFORMIN (GLUCOPHAGE) 1000 MG tablet Take 1 tablet (1,000 mg total) by mouth 2 (two) times daily with a meal.  . [DISCONTINUED] senna-docusate (SENOKOT-S) 8.6-50 MG per tablet Take 2 tablets by mouth 2 (two) times daily. Until stooling regularly          Objective:   Physical Exam  Constitutional: He appears well-developed and well-nourished.  HENT:  Head: Normocephalic and atraumatic.  Skin: Skin is warm.  On his left heel he has an approximately 2-1/2 cm thickened area that appears to be sloughing from the skin.  Psychiatric: He has a normal mood and affect. His behavior is normal.    Lesion on the left heel was debrided with a #10 blade. The thickness in scale was  removed. He does have a stage I ulcer underneath. No open wound or drainage.      Assessment & Plan:  Diabetes- overall he has made great progress. Little too early to do a repeat hemoglobin A1c so we will repeat this in 6 weeks. But based on his home numbers it sounds like he is finally getting his sugars under control. Encouraged him to stay with the 14 units at [facility] daily. This is extremely important to wound healing.  HTN - well controlled. Continue current regimen.  Knee injury-following with orthopedist. If they decide to give him a steroid injection I will be happy to adjust his insulin as needed.  Diabetic foot ulcer-discussed the importance of keeping pressure off of the area. Making sure that he is wearing good fitting shoe wear and checking issues to make sure there's not any debris etc. that might be rubbing his foot. Also getting his sugars under control is extremely important to wound healing. Since this is a very early wound encouraged him to keep an eye on it over the next week or 2 and if it is not healing to please come back in. Make sure wearing shoes even around the house. Do not go barefoot.

## 2014-04-17 DIAGNOSIS — E1122 Type 2 diabetes mellitus with diabetic chronic kidney disease: Secondary | ICD-10-CM | POA: Insufficient documentation

## 2014-04-17 DIAGNOSIS — D509 Iron deficiency anemia, unspecified: Secondary | ICD-10-CM | POA: Insufficient documentation

## 2014-04-17 DIAGNOSIS — D72829 Elevated white blood cell count, unspecified: Secondary | ICD-10-CM | POA: Insufficient documentation

## 2014-04-17 DIAGNOSIS — I272 Pulmonary hypertension, unspecified: Secondary | ICD-10-CM | POA: Insufficient documentation

## 2014-04-17 DIAGNOSIS — I5042 Chronic combined systolic (congestive) and diastolic (congestive) heart failure: Secondary | ICD-10-CM | POA: Insufficient documentation

## 2014-04-20 DIAGNOSIS — K5900 Constipation, unspecified: Secondary | ICD-10-CM | POA: Insufficient documentation

## 2014-04-22 ENCOUNTER — Telehealth: Payer: Self-pay | Admitting: Family Medicine

## 2014-04-22 NOTE — Telephone Encounter (Signed)
I got the notes from his nephrologist.  He says ok to use low dose metformin 500mg  BID.  This could help regulate his sugar. See if he is ok with start ing med or if wants to hold off until next f/u that is fine.

## 2014-04-23 ENCOUNTER — Other Ambulatory Visit: Payer: BC Managed Care – PPO

## 2014-04-23 NOTE — Telephone Encounter (Signed)
lvm informing pt of recommendations and asked that he return call .Audelia Hives [place]

## 2014-04-24 NOTE — Telephone Encounter (Signed)
Called the #'s listed for pt and the home/mobile # is incorrect the person that answered said I had the wrong # Called the Emergency contact and was told I had the wrong # also.James Burke [place]

## 2014-04-30 ENCOUNTER — Telehealth: Payer: Self-pay

## 2014-04-30 NOTE — Telephone Encounter (Signed)
James Burke from the wound care center called and left a message stating the patient's blood sugar was elevated to 154/102 without any symptoms. I called patient and left a message for him to call back with blood pressure readings and symptoms.

## 2014-05-07 ENCOUNTER — Encounter: Payer: Self-pay | Admitting: Family Medicine

## 2014-05-07 ENCOUNTER — Ambulatory Visit (INDEPENDENT_AMBULATORY_CARE_PROVIDER_SITE_OTHER): Payer: BC Managed Care – PPO | Admitting: Family Medicine

## 2014-05-07 VITALS — BP 153/97 | HR 100 | Wt 197.0 lb

## 2014-05-07 DIAGNOSIS — N183 Chronic kidney disease, stage 3 unspecified: Secondary | ICD-10-CM | POA: Diagnosis not present

## 2014-05-07 DIAGNOSIS — I10 Essential (primary) hypertension: Secondary | ICD-10-CM | POA: Diagnosis not present

## 2014-05-07 DIAGNOSIS — E119 Type 2 diabetes mellitus without complications: Secondary | ICD-10-CM | POA: Diagnosis not present

## 2014-05-07 DIAGNOSIS — N1832 Chronic kidney disease, stage 3b: Secondary | ICD-10-CM

## 2014-05-07 DIAGNOSIS — D649 Anemia, unspecified: Secondary | ICD-10-CM

## 2014-05-07 DIAGNOSIS — M009 Pyogenic arthritis, unspecified: Secondary | ICD-10-CM | POA: Diagnosis not present

## 2014-05-07 MED ORDER — CARVEDILOL 25 MG PO TABS: 25.0000 mg | ORAL_TABLET | Freq: Two times a day (BID) | ORAL | Status: DC

## 2014-05-07 NOTE — Progress Notes (Signed)
   Subjective:    Patient ID: James Burke, male    DOB: Dec 26, 1965, 48 y.o.   MRN: [id number]  [facility] f/u  -He was admitted on 8/13 to [facility] for septic arthritis of the left knee/MSSA. D/c home on 8/19 [facility] he underwent synovectomy and wash out of his knees. Cultures at that time grew out methicillin sensitive staph aureus. He was treated with IV antibiotic therapy and had a repeat washout performed on August 13. He was discharged on daptomycin. He was noted to have anemia of uncertain etiology. Hemoglobin was low at 6. left knee pt's BP and Hgb has been out of range since he has been out of the hospital. Has been high. Seeing infectious disease for management of the infection.  Blood sugars were running low so they stopped his Toujeo.  Hopefully today will be last IV abx infuxion.    DM- Home sugars have been running under 120.  Off of his insulin but hasn't been eating much as well.  He says his been checking his sugars frequently.  Anemia - was given  6 units of blood.   Last Hgb on Monday was up to 9.9.  Review of Systems     Objective:   Physical Exam  Constitutional: He is oriented to person, place, and time. He appears well-developed and well-nourished.  HENT:  Head: Normocephalic and atraumatic.  Cardiovascular: Normal rate, regular rhythm and normal heart sounds.   Pulmonary/Chest: Effort normal and breath sounds normal.  Neurological: He is alert and oriented to person, place, and time.  Skin: Skin is warm and dry.  Psychiatric: He has a normal mood and affect. His behavior is normal.    Left knee is still swollen but no erythema. Surgical sites appear to be intact and dry.      Assessment & Plan:  DM- well controlled. Sounds like his home numbers are well controlled. I encouraged him to continue to check his sugars daily. As he starts to fill better, become more mobile, and start to eat more normally his sugars will likely rise and he may need to  restart his Toujeo.  Hypertension-will increase his carvedilol back to 25 mg twice a day. This was the dose he was taking about a year ago. We'll follow carefully for hypotensive episodes. He does monitor his own blood pressure at home and can certainly call if she's noticing any problems.  Anemia-unclear etiology. we'll recheck his hemoglobin in 2-3 weeks to make sure they're stable and hopefully continuing to increase.  CKD 3-due to recheck kidney function at the end of the month. Next followup with nephrology is in 3 months.  Left septic knee joint-healing. Hopefully he will be able to discontinue his IV antibiotics in the next couple of days. He is currently being followed by infectious disease.

## 2014-06-04 ENCOUNTER — Encounter: Payer: Self-pay | Admitting: Family Medicine

## 2014-06-04 ENCOUNTER — Other Ambulatory Visit: Payer: Self-pay | Admitting: Family Medicine

## 2014-06-04 ENCOUNTER — Telehealth: Payer: Self-pay | Admitting: *Deleted

## 2014-06-04 ENCOUNTER — Ambulatory Visit (INDEPENDENT_AMBULATORY_CARE_PROVIDER_SITE_OTHER): Payer: BC Managed Care – PPO | Admitting: Family Medicine

## 2014-06-04 ENCOUNTER — Ambulatory Visit (INDEPENDENT_AMBULATORY_CARE_PROVIDER_SITE_OTHER): Payer: BC Managed Care – PPO

## 2014-06-04 VITALS — BP 130/82 | HR 85 | Temp 97.6°F | Wt 193.0 lb

## 2014-06-04 DIAGNOSIS — I5032 Chronic diastolic (congestive) heart failure: Secondary | ICD-10-CM

## 2014-06-04 DIAGNOSIS — M009 Pyogenic arthritis, unspecified: Secondary | ICD-10-CM | POA: Diagnosis not present

## 2014-06-04 DIAGNOSIS — I5022 Chronic systolic (congestive) heart failure: Secondary | ICD-10-CM

## 2014-06-04 DIAGNOSIS — Z23 Encounter for immunization: Secondary | ICD-10-CM | POA: Diagnosis not present

## 2014-06-04 DIAGNOSIS — R0602 Shortness of breath: Secondary | ICD-10-CM

## 2014-06-04 DIAGNOSIS — I509 Heart failure, unspecified: Secondary | ICD-10-CM

## 2014-06-04 NOTE — Progress Notes (Addendum)
   Subjective:    Patient ID: James Burke, male    DOB: June 28, 1966, 48 y.o.   MRN: [id number]  Diabetes   Still on oral antibiotics, keflex,  for his spetic knee. He is now able to use a cane to walk.  Still followed by infectious disease.  Says sed rate still up but "other numbers" have come down.  They feel iie things are healing well.  Has been trying to elevated his leg.   C/o sob x 10 days.  Worse when lays down. Says he hears a gurgling sound.  occ cough.  No increased swelling.  Infact his weight is down 4 lbs.  no chest pain or palpitations or irregular heart rhythm. No prior history of asthma or COPD. last hemoglobin was 10.1 with infectious disease about a week ago.    Review of Systems     Objective:   Physical Exam  Constitutional: He is oriented to person, place, and time. He appears well-developed and well-nourished.  HENT:  Head: Normocephalic and atraumatic.  Cardiovascular: Normal rate, regular rhythm and normal heart sounds.   Pulmonary/Chest: Effort normal and breath sounds normal. No respiratory distress. He has no wheezes. He has no rales. He exhibits no tenderness.  Musculoskeletal:  Left knee is significantly swollen but no erythema. There some trace edema in the left ankle. None on the right.  Neurological: He is alert and oriented to person, place, and time.  Skin: Skin is warm and dry.  Psychiatric: He has a normal mood and affect. His behavior is normal.          Assessment & Plan:  SOB- PE vs infection versus recurrence of congestive her clear that he does not have any increase in fluid or weight today. We'll perform EKG, check a d-dimer TSH, and BNP. Also get chest x-ray today. EKG shows rate of 80 beats per minute, normal sinus rhythm with left ventricular hypertrophy. The prolonged QT.  Septic knee-being followed by infectious disease. Still on oral antibiotics in stable.  Given flu vaccine today.

## 2014-06-04 NOTE — Telephone Encounter (Signed)
Spoke w/Kelly and asked that she run pt's D-Dimer Stat she stated that she would put in a template to run this as a stat test.Meer Reindl, [place]

## 2014-06-04 NOTE — Telephone Encounter (Signed)
Spoke w/jane in MR asked that she fax over pt's last ov notes and last ekg.Audelia Hives [place]

## 2014-06-04 NOTE — Addendum Note (Signed)
Addended by: Teddy Spike on: 06/04/2014 03:41 PM   Modules accepted: Medications

## 2014-06-04 NOTE — Addendum Note (Signed)
Addended by: [name] D on: 06/04/2014 03:44 PM   Modules accepted: Level of Service

## 2014-06-05 ENCOUNTER — Telehealth: Payer: Self-pay

## 2014-06-05 ENCOUNTER — Other Ambulatory Visit: Payer: Self-pay | Admitting: Family Medicine

## 2014-06-05 ENCOUNTER — Encounter (HOSPITAL_COMMUNITY)
Admission: RE | Admit: 2014-06-05 | Discharge: 2014-06-05 | Disposition: A | Discharge status: Home / Self Care | Payer: BC Managed Care – PPO | Source: Ambulatory Visit | Attending: Diagnostic Radiology | Admitting: Diagnostic Radiology

## 2014-06-05 DIAGNOSIS — R7989 Other specified abnormal findings of blood chemistry: Secondary | ICD-10-CM

## 2014-06-05 DIAGNOSIS — R0602 Shortness of breath: Secondary | ICD-10-CM | POA: Diagnosis not present

## 2014-06-05 DIAGNOSIS — Z9889 Other specified postprocedural states: Secondary | ICD-10-CM | POA: Insufficient documentation

## 2014-06-05 DIAGNOSIS — R791 Abnormal coagulation profile: Secondary | ICD-10-CM | POA: Insufficient documentation

## 2014-06-05 LAB — BASIC METABOLIC PANEL WITH GFR
BUN: 44 mg/dL — ABNORMAL HIGH (ref 6–23)
CO2: 28 mEq/L (ref 19–32)
Calcium: 9.1 mg/dL (ref 8.4–10.5)
Chloride: 102 mEq/L (ref 96–112)
Creat: 2.36 mg/dL — ABNORMAL HIGH (ref 0.50–1.35)
GFR, Est African American: 37 mL/min — ABNORMAL LOW
GFR, Est Non African American: 32 mL/min — ABNORMAL LOW
Glucose, Bld: 123 mg/dL — ABNORMAL HIGH (ref 70–99)
Potassium: 4.1 mEq/L (ref 3.5–5.3)
Sodium: 138 mEq/L (ref 135–145)

## 2014-06-05 LAB — T4, FREE: Free T4: 1.13 ng/dL (ref 0.80–1.80)

## 2014-06-05 LAB — D-DIMER, QUANTITATIVE (NOT AT ARMC): D-Dimer, Quant: 1.69 ug/mL-FEU — ABNORMAL HIGH (ref 0.00–0.48)

## 2014-06-05 LAB — T3, FREE: T3, Free: 3.8 pg/mL (ref 2.3–4.2)

## 2014-06-05 LAB — BRAIN NATRIURETIC PEPTIDE: Brain Natriuretic Peptide: 1250.4 pg/mL — ABNORMAL HIGH (ref 0.0–100.0)

## 2014-06-05 LAB — TSH: TSH: 7.359 u[IU]/mL — ABNORMAL HIGH (ref 0.350–4.500)

## 2014-06-05 MED ORDER — TECHNETIUM TC 99M DIETHYLENETRIAME-PENTAACETIC ACID: 40.0000 | Freq: Once | INTRAVENOUS | Status: AC | PRN

## 2014-06-05 MED ORDER — FUROSEMIDE 20 MG PO TABS: 20.0000 mg | ORAL_TABLET | Freq: Every day | ORAL | Status: DC

## 2014-06-05 MED ORDER — TECHNETIUM TO 99M ALBUMIN AGGREGATED: 5.0000 | Freq: Once | INTRAVENOUS | Status: AC | PRN

## 2014-06-05 NOTE — Telephone Encounter (Signed)
Changed CT Angio to Lung VQ scan. No prior Josem Kaufmann is required for CPT code [phone] per [place] with [place].

## 2014-06-06 ENCOUNTER — Other Ambulatory Visit (HOSPITAL_COMMUNITY): Payer: BC Managed Care – PPO

## 2014-06-09 ENCOUNTER — Encounter: Payer: Self-pay | Admitting: Family Medicine

## 2014-06-09 ENCOUNTER — Telehealth: Payer: Self-pay | Admitting: Family Medicine

## 2014-06-09 ENCOUNTER — Ambulatory Visit (INDEPENDENT_AMBULATORY_CARE_PROVIDER_SITE_OTHER): Payer: BC Managed Care – PPO | Admitting: Family Medicine

## 2014-06-09 VITALS — BP 130/85 | HR 81 | Temp 98.0°F | Wt 194.0 lb

## 2014-06-09 DIAGNOSIS — I5022 Chronic systolic (congestive) heart failure: Secondary | ICD-10-CM | POA: Diagnosis not present

## 2014-06-09 DIAGNOSIS — R0602 Shortness of breath: Secondary | ICD-10-CM

## 2014-06-09 NOTE — Progress Notes (Signed)
   Subjective:    Patient ID: James Burke, male    DOB: 1966/06/17, 48 y.o.   MRN: [id number]  HPI Here to followup on shortness of breath. I had seen him last week for this. Still feels really SOB at times. He had an elevated d-dimer as well as an elevated BNP. Because of renal function we were only able to do a VQ scan. It was negative. He does have a prior history of congestive heart failure that he had not noted any recent onset of swelling.  He is still feeling short of breath today. He originally noticed some gurgling in his chest when he would lay flat. He says that has actually gotten better since starting Lasix on Friday.  Still can't sleep laying flat. Had echo done on 04/18/14 while in the hospital to make sure no infection to the heart from his knee.  Noted a moderate pericardial effusion.  Just completed his antibiotic yesterday for his knee.  CP with inpsiration esp when he feels SOB. EKG was also mildly abnormal last week. Not acute changes. No change in swelling or blood pressure. His next followup appointment with his cardiologist is in January.   Review of Systems     Objective:   Physical Exam  Constitutional: He is oriented to person, place, and time. He appears well-developed and well-nourished.  HENT:  Head: Normocephalic and atraumatic.  Cardiovascular: Normal rate, regular rhythm and normal heart sounds.   Pulmonary/Chest: Effort normal and breath sounds normal.  Neurological: He is alert and oriented to person, place, and time.  Skin: Skin is warm and dry.  Psychiatric: He has a normal mood and affect. His behavior is normal.          Assessment & Plan:  SOB- still unclear etiology. His VQ scan was negative for a pulmonary embolism. Chest x-ray was negative for acute infection. His BNP was elevated so we'll have him continue the Lasix. We will check a BMP today. I would like to refer him to his cardiologist. Marena Chancy if this could be related to the pericardial  effusion seen on echo in August at [facility]. We will see if we can try to get him in this week. If he becomes more symptomatic then encouraged him to go to the emergency department.

## 2014-06-09 NOTE — Telephone Encounter (Signed)
I called pt's cards- Dr. Maryjane Hurter office as requested and scheduled patient with his P.A. For Wed. Oct 7th at 2:30 and I called patient and left him ALL the appt info on his voicemail and marked Urgent.. Thanks

## 2014-06-10 ENCOUNTER — Telehealth: Payer: Self-pay | Admitting: *Deleted

## 2014-06-10 ENCOUNTER — Encounter: Payer: Self-pay | Admitting: Family Medicine

## 2014-06-10 DIAGNOSIS — J90 Pleural effusion, not elsewhere classified: Secondary | ICD-10-CM | POA: Insufficient documentation

## 2014-06-10 LAB — BASIC METABOLIC PANEL WITH GFR
BUN: 38 mg/dL — ABNORMAL HIGH (ref 6–23)
CO2: 30 mEq/L (ref 19–32)
Calcium: 9.4 mg/dL (ref 8.4–10.5)
Chloride: 98 mEq/L (ref 96–112)
Creat: 2.23 mg/dL — ABNORMAL HIGH (ref 0.50–1.35)
GFR, Est African American: 39 mL/min — ABNORMAL LOW
GFR, Est Non African American: 34 mL/min — ABNORMAL LOW
Glucose, Bld: 116 mg/dL — ABNORMAL HIGH (ref 70–99)
Potassium: 4.4 mEq/L (ref 3.5–5.3)
Sodium: 139 mEq/L (ref 135–145)

## 2014-06-10 NOTE — Telephone Encounter (Signed)
Butch Penny called to inform you that James Burke was admitted to [facility] for fluid around his heart. He was admitted this morning. Margette Fast, CMA

## 2014-06-11 NOTE — Telephone Encounter (Signed)
Unfortunately he ended up going to the emergency department overnight. He has been admitted for congestive heart failure. We started him on Lasix last Friday but evidently it was not quite enough to diuresis him. His wife has already called  to reschedule. a cardiology appointment.

## 2014-06-13 ENCOUNTER — Encounter: Payer: Self-pay | Admitting: Family Medicine

## 2014-06-17 MED ORDER — ZOLPIDEM TARTRATE 5 MG PO TABS: 5.0000 mg | ORAL_TABLET | Freq: Every evening | ORAL | Status: DC | PRN

## 2014-06-20 ENCOUNTER — Encounter: Payer: Self-pay | Admitting: Family Medicine

## 2014-06-20 ENCOUNTER — Ambulatory Visit (INDEPENDENT_AMBULATORY_CARE_PROVIDER_SITE_OTHER): Payer: BC Managed Care – PPO | Admitting: Family Medicine

## 2014-06-20 VITALS — BP 131/89 | HR 97 | Temp 97.6°F | Wt 189.0 lb

## 2014-06-20 DIAGNOSIS — I313 Pericardial effusion (noninflammatory): Secondary | ICD-10-CM

## 2014-06-20 DIAGNOSIS — G4700 Insomnia, unspecified: Secondary | ICD-10-CM | POA: Diagnosis not present

## 2014-06-20 DIAGNOSIS — I5023 Acute on chronic systolic (congestive) heart failure: Secondary | ICD-10-CM

## 2014-06-20 DIAGNOSIS — I319 Disease of pericardium, unspecified: Secondary | ICD-10-CM | POA: Diagnosis not present

## 2014-06-20 DIAGNOSIS — I3139 Other pericardial effusion (noninflammatory): Secondary | ICD-10-CM

## 2014-06-20 LAB — COMPLETE METABOLIC PANEL WITH GFR
ALT: 17 U/L (ref 0–53)
AST: 15 U/L (ref 0–37)
Albumin: 4 g/dL (ref 3.5–5.2)
Alkaline Phosphatase: 53 U/L (ref 39–117)
BUN: 54 mg/dL — ABNORMAL HIGH (ref 6–23)
CO2: 27 mEq/L (ref 19–32)
Calcium: 9.8 mg/dL (ref 8.4–10.5)
Chloride: 102 mEq/L (ref 96–112)
Creat: 2.46 mg/dL — ABNORMAL HIGH (ref 0.50–1.35)
GFR, Est African American: 35 mL/min — ABNORMAL LOW
GFR, Est Non African American: 30 mL/min — ABNORMAL LOW
Glucose, Bld: 126 mg/dL — ABNORMAL HIGH (ref 70–99)
Potassium: 4.3 mEq/L (ref 3.5–5.3)
Sodium: 139 mEq/L (ref 135–145)
Total Bilirubin: 0.5 mg/dL (ref 0.2–1.2)
Total Protein: 7.2 g/dL (ref 6.0–8.3)

## 2014-06-20 NOTE — Progress Notes (Signed)
   Subjective:    Patient ID: James Burke, male    DOB: 1966-02-10, 48 y.o.   MRN: [id number]  HPI Call pt: He was admitted on 06/10/14 At [facility]. He was discharged home on 06/12/2014. They did increase his Lasix to 40 mg and add a potassium supplement. He has a persistent pericardial effusion. It was noted initially back in August.They diuresed him and he has been much improved. CP has resolved as well.   He's been working on weaning off the oxycodone. The result has been a J-shaped he has difficulty falling asleep at night. He had contacted me through my chart. I then a prescription for Ambien for him to try. Unfortunately did not get the message and so has not been able to pick it up yet.  Review of Systems     Objective:   Physical Exam  Constitutional: He is oriented to person, place, and time. He appears well-developed and well-nourished.  HENT:  Head: Normocephalic and atraumatic.  Cardiovascular: Normal rate, regular rhythm and normal heart sounds.   Pulmonary/Chest: Effort normal and breath sounds normal.  Musculoskeletal: He exhibits no edema.  Neurological: He is alert and oriented to person, place, and time.  Skin: Skin is warm and dry.  Psychiatric: He has a normal mood and affect. His behavior is normal.          Assessment & Plan:  Acute on congestive heart failure. He is stable and doing much better over all. He would like to establish with a new cardiologist. Will refer him to Dr. Kirk Ruths here [place]. We'll try to get him in the next 2-3 weeks if possible. Otherwise he'll need to see him back to recheck labs at that time. He is breathing much better and heart and lung exam is normal today.   Pericardial effusion - Stable.  F/U with cardiology.   Insomnia-I. did send her a prescription for low-dose Ambien to use for the next couple weeks if needed. If at that point he still having increase in his restless leg syndrome symptoms then please  let me know and we'll consider putting him on Requip.

## 2014-06-25 ENCOUNTER — Encounter: Payer: Self-pay | Admitting: Cardiology

## 2014-06-25 ENCOUNTER — Ambulatory Visit (INDEPENDENT_AMBULATORY_CARE_PROVIDER_SITE_OTHER): Payer: BC Managed Care – PPO | Admitting: Cardiology

## 2014-06-25 VITALS — BP 132/84 | HR 87 | Ht 72.0 in | Wt 189.4 lb

## 2014-06-25 DIAGNOSIS — I313 Pericardial effusion (noninflammatory): Secondary | ICD-10-CM | POA: Insufficient documentation

## 2014-06-25 DIAGNOSIS — N183 Chronic kidney disease, stage 3 unspecified: Secondary | ICD-10-CM

## 2014-06-25 DIAGNOSIS — I5022 Chronic systolic (congestive) heart failure: Secondary | ICD-10-CM

## 2014-06-25 DIAGNOSIS — I319 Disease of pericardium, unspecified: Secondary | ICD-10-CM

## 2014-06-25 DIAGNOSIS — I3139 Other pericardial effusion (noninflammatory): Secondary | ICD-10-CM | POA: Insufficient documentation

## 2014-06-25 DIAGNOSIS — I5042 Chronic combined systolic (congestive) and diastolic (congestive) heart failure: Secondary | ICD-10-CM

## 2014-06-25 DIAGNOSIS — E1122 Type 2 diabetes mellitus with diabetic chronic kidney disease: Secondary | ICD-10-CM

## 2014-06-25 DIAGNOSIS — I10 Essential (primary) hypertension: Secondary | ICD-10-CM

## 2014-06-25 DIAGNOSIS — E781 Pure hyperglyceridemia: Secondary | ICD-10-CM

## 2014-06-25 MED ORDER — ATORVASTATIN CALCIUM 10 MG PO TABS: 10.0000 mg | ORAL_TABLET | Freq: Every day | ORAL | Status: DC

## 2014-06-25 NOTE — Progress Notes (Signed)
HPI: 48 year old male for evaluation of congestive heart failure. Patient has had congestive heart failure for approximately 8-10 years. He was previously followed in [facility] by Dr. Olena Heckle. He has not had a cardiac catheterization previously by his report. Patient had an echocardiogram in August of 2015 that showed an ejection fraction of 40-45% with inferior lateral akinesis. There was a moderate pericardial effusion. There was grade 2 diastolic dysfunction and severe left atrial enlargement. He was last admitted to [facility] in October of 2015 with congestive heart failure. Patient underwent IV diuresis. He also has chronic renal failure, diabetes mellitus, microcytic anemia and hypertension. Chest CT during recent admission showed moderate pericardial effusion and small bilateral pleural effusions. Ultrasound of his lower extremities showed no left lower extremity DVT. Baseline creatinine is approximately 2.52. Since he was discharged he is much improved. At present he denies dyspnea on exertion, orthopnea, PND, pedal edema, palpitations, syncope or chest pain.  Current Outpatient Prescriptions  Medication Sig Dispense Refill  . AMBULATORY NON FORMULARY MEDICATION Medication Name: glucometer, strips and lancet Dx 250.02 Test twice a day.  100 Units  pRN  . aspirin 81 MG tablet Take 81 mg by mouth daily.      . carvedilol (COREG) 25 MG tablet Take 1 tablet (25 mg total) by mouth 2 (two) times daily with a meal.  60 tablet  5  . furosemide (LASIX) 20 MG tablet Take 40 mg by mouth daily.      Marland Kitchen glipiZIDE (GLUCOTROL) 10 MG tablet TAKE 1 TABLET BY MOUTH TWICE DAILY PRIOR TO MEALS  60 tablet  3  . lisinopril-hydrochlorothiazide (PRINZIDE,ZESTORETIC) 20-12.5 MG per tablet Take 1 tablet by mouth daily.      . potassium chloride (KLOR-CON) 20 MEQ packet Take 40 mEq by mouth 2 (two) times daily.      . tamsulosin (FLOMAX) 0.4 MG CAPS capsule Take 1 capsule (0.4 mg total) by mouth daily  after supper.  30 capsule  3  . zolpidem (AMBIEN) 5 MG tablet Take 1 tablet (5 mg total) by mouth at bedtime as needed for sleep.  30 tablet  0   No current facility-administered medications for this visit.    Allergies  Allergen Reactions  . Prednisone     Other reaction(s): Other blindness  . Simvastatin Other (See Comments)    Joint aches.     Past Medical History  Diagnosis Date  . Diabetes mellitus   . Congestive heart failure   . C. difficile colitis 04/18/2008  . Renal insufficiency   . GERD (gastroesophageal reflux disease)   . Arthritis, septic, knee     Past Surgical History  Procedure Laterality Date  . Appendectomy  1995  . Knee surgery      History   Social History  . Marital Status: Married    Spouse Name: N/A    Number of Children: 3  . Years of Education: N/A   Occupational History  . Not on file.   Social History Main Topics  . Smoking status: Never Smoker   . Smokeless tobacco: Not on file  . Alcohol Use: No  . Drug Use: No  . Sexual Activity: Yes   Other Topics Concern  . Not on file   Social History Narrative  . No narrative on file    Family History  Problem Relation Age of Onset  . Heart disease      No family history    ROS: Some left knee pain  from previous infection but no fevers or chills, productive cough, hemoptysis, dysphasia, odynophagia, melena, hematochezia, dysuria, hematuria, rash, seizure activity, orthopnea, PND, pedal edema, claudication. Remaining systems are negative.  Physical Exam:   Blood pressure 132/84, pulse 87, height 6' (1.829 m), weight 189 lb 6.4 oz (85.911 kg).  General:  Well developed/well nourished in NAD Skin warm/dry Patient not depressed No peripheral clubbing Back-normal HEENT-normal/normal eyelids Neck supple/normal carotid upstroke bilaterally; no bruits; no JVD; no thyromegaly chest - CTA/ normal expansion CV - RRR/normal S1 and S2; no murmurs, rubs or gallops;  PMI  nondisplaced Abdomen -NT/ND, no HSM, no mass, + bowel sounds, no bruit 2+ femoral pulses, no bruits Ext-no edema, chords, 2+ DP, Small residual left knee effusion Neuro-grossly nonfocal  ECG Sinus rhythm at a rate of 86. Anterior lateral and inferior T-wave inversion.

## 2014-06-25 NOTE — Assessment & Plan Note (Signed)
Patient followed by nephrology.

## 2014-06-25 NOTE — Assessment & Plan Note (Signed)
Given history of diabetes mellitus I will initiate Lipitor 10 mg daily. Check lipids and liver in 4 weeks. He did not tolerate Zocor.

## 2014-06-25 NOTE — Assessment & Plan Note (Signed)
Blood pressure controlled. Continue present medications. 

## 2014-06-25 NOTE — Patient Instructions (Signed)
Your physician recommends that you schedule a follow-up appointment in: 3 MONTHS WITH DR CRENSHAW  START ATORVASTATIN 10 MG ONCE DAILY  Your physician recommends that you return for lab work in: [place] Lay= DO NOT EAT PRIOR TO LAB WORK

## 2014-06-25 NOTE — Assessment & Plan Note (Signed)
Patient is markedly improved following recent admission. Continue present dose of Lasix. Take additional 40 mg daily for edema or increased dyspnea. Continue carvedilol and ACE inhibitor. I will obtain all of his records from [facility] concerning previous evaluation. Not clear that he has had a previous ischemia evaluation. If not we will plan a nuclear study. Patient counseled on low sodium diet.

## 2014-06-25 NOTE — Assessment & Plan Note (Signed)
We will plan followup echocardiogram when he returns in 3 months.

## 2014-07-16 ENCOUNTER — Ambulatory Visit (INDEPENDENT_AMBULATORY_CARE_PROVIDER_SITE_OTHER): Payer: BC Managed Care – PPO | Admitting: Family Medicine

## 2014-07-16 ENCOUNTER — Telehealth: Payer: Self-pay | Admitting: Family Medicine

## 2014-07-16 ENCOUNTER — Encounter: Payer: Self-pay | Admitting: Family Medicine

## 2014-07-16 ENCOUNTER — Other Ambulatory Visit: Payer: Self-pay | Admitting: Sports Medicine

## 2014-07-16 VITALS — BP 101/64 | HR 98 | Wt 188.0 lb

## 2014-07-16 DIAGNOSIS — E119 Type 2 diabetes mellitus without complications: Secondary | ICD-10-CM | POA: Diagnosis not present

## 2014-07-16 DIAGNOSIS — E1129 Type 2 diabetes mellitus with other diabetic kidney complication: Secondary | ICD-10-CM | POA: Diagnosis not present

## 2014-07-16 DIAGNOSIS — I5042 Chronic combined systolic (congestive) and diastolic (congestive) heart failure: Secondary | ICD-10-CM | POA: Diagnosis not present

## 2014-07-16 DIAGNOSIS — G4700 Insomnia, unspecified: Secondary | ICD-10-CM | POA: Insufficient documentation

## 2014-07-16 DIAGNOSIS — E781 Pure hyperglyceridemia: Secondary | ICD-10-CM

## 2014-07-16 DIAGNOSIS — N183 Chronic kidney disease, stage 3 (moderate): Secondary | ICD-10-CM | POA: Diagnosis not present

## 2014-07-16 DIAGNOSIS — N1832 Chronic kidney disease, stage 3b: Secondary | ICD-10-CM

## 2014-07-16 DIAGNOSIS — N058 Unspecified nephritic syndrome with other morphologic changes: Secondary | ICD-10-CM

## 2014-07-16 LAB — POCT GLYCOSYLATED HEMOGLOBIN (HGB A1C): Hemoglobin A1C: 6.9

## 2014-07-16 MED ORDER — ZOLPIDEM TARTRATE 10 MG PO TABS: 10.0000 mg | ORAL_TABLET | Freq: Every evening | ORAL | Status: DC | PRN

## 2014-07-16 NOTE — Assessment & Plan Note (Signed)
He has been intolerant to some statins in the past but so far doing well Lipitor 10 mg daily. He sued a recheck his blood work in about 10 days.

## 2014-07-16 NOTE — Telephone Encounter (Signed)
Please call get records from [facility]. I believe he sees either Dr. Posey Pronto or Dr. Florene Glen.

## 2014-07-16 NOTE — Assessment & Plan Note (Signed)
So not well controlled. We will try to increase dose on the Ambien. Warned about potential for excess sedation and respiratory depression. If he feels like he is fluid overloaded or short of breath he is not taking Ambien at all. He clearly understands this. If he does not like the increase in dreams that occur with the medication we can always try something else. Continue to use PRN only.

## 2014-07-16 NOTE — Assessment & Plan Note (Signed)
Well-controlled. His hemoglobin A-1 C was previously 10. It is now down to 6.9 today which is fantastic. He is also off of insulin and has been able to control this with diet and in the glipizide. He did have one hypoglycemic event last week. He did fill shaky when it occurred. His blood sugar was around 79. Will try to contact his nephrologist to see if it would be okay to consider restarting metformin with his chronic kidney disease. If it is then we might be able to decrease the glipizide which will lower his risk for hypoglycemia. He is so far tolerating a low dose of Lipitor 10 mg. He supposed go for blood work to repeat his lipid panel in about 10 days.

## 2014-07-16 NOTE — Progress Notes (Signed)
   Subjective:    Patient ID: James Burke, male    DOB: [birth year], 48 y.o.   MRN: [id number]  HPI Chronic systolic/diastilic heart fialure f/u. Now that he is eating more normally he feels his base weight is around 182-184.  Lowest weight was 179. He denies any recent onset of swelling or shortness of breath. He has been taking an extra dose of Lasix every 2 to 3 days. He said he's been doing it more for maintenance not because his weight is really increasing. He just wasn't sure what to do.  Insomnia f/u - he does use the Ambien occasionally because he still has a lot left. He's on 5 mg. He says it just gives him vivid dreams. They're not harmful or nightmares.  DM- his blood sugars have been running under hundred and 30 fasting for the last several weeks. He sent a [employer] could control. His last statements he was greater than 10. At the time he was in renal failure.  Hypertriglyceridemia-so far doing well Lipitor 10 mg. He's been intolerant to other statins in the past. History to repeat his levels in about 10 days with Dr. Stanford Breed.  Review of Systems     Objective:   Physical Exam  Constitutional: He is oriented to person, place, and time. He appears well-developed and well-nourished.  HENT:  Head: Normocephalic and atraumatic.  Cardiovascular: Normal rate, regular rhythm and normal heart sounds.   Pulmonary/Chest: Effort normal and breath sounds normal.  Neurological: He is alert and oriented to person, place, and time.  Skin: Skin is warm and dry.  Psychiatric: He has a normal mood and affect. His behavior is normal.          Assessment & Plan:  Combined systolic and diastolic heart failure-sofar stable. I think his baseline weight is going to be around and 182-184. Continue to monitor weight daily.. If he notices a 3 pound or more weight gain he needs to take an extra dose of lay 6.

## 2014-07-18 ENCOUNTER — Ambulatory Visit: Payer: BC Managed Care – PPO | Admitting: Family Medicine

## 2014-07-18 NOTE — Telephone Encounter (Signed)
Received records

## 2014-07-19 ENCOUNTER — Encounter: Payer: Self-pay | Admitting: Family Medicine

## 2014-07-25 ENCOUNTER — Other Ambulatory Visit: Payer: Self-pay | Admitting: Family Medicine

## 2014-07-25 MED ORDER — FUROSEMIDE 40 MG PO TABS: 40.0000 mg | ORAL_TABLET | Freq: Every day | ORAL | Status: DC

## 2014-08-10 ENCOUNTER — Other Ambulatory Visit: Payer: Self-pay | Admitting: Family Medicine

## 2014-08-11 ENCOUNTER — Other Ambulatory Visit: Payer: Self-pay

## 2014-08-11 MED ORDER — POTASSIUM CHLORIDE ER 20 MEQ PO TBCR: 40.0000 mg | EXTENDED_RELEASE_TABLET | Freq: Every day | ORAL | Status: DC

## 2014-08-20 ENCOUNTER — Other Ambulatory Visit: Payer: Self-pay | Admitting: Family Medicine

## 2014-08-21 ENCOUNTER — Other Ambulatory Visit: Payer: Self-pay | Admitting: Cardiology

## 2014-08-21 LAB — CMP14+LP+1AC+CBC/D/PLT+TSH
Calcium: 10 mg/dL
Carbon Monoxide, Blood: 27
Chloride: 97 mmol/L

## 2014-08-22 ENCOUNTER — Encounter: Payer: Self-pay | Admitting: Cardiology

## 2014-08-22 LAB — HEPATIC FUNCTION PANEL
ALT: 34 IU/L (ref 0–44)
AST: 26 IU/L (ref 0–40)
Albumin: 4.4 g/dL (ref 3.5–5.5)
Alkaline Phosphatase: 51 IU/L (ref 39–117)
Bilirubin, Direct: 0.12 mg/dL (ref 0.00–0.40)
Total Bilirubin: 0.4 mg/dL (ref 0.0–1.2)
Total Protein: 7.4 g/dL (ref 6.0–8.5)

## 2014-08-22 LAB — LIPID PANEL W/O CHOL/HDL RATIO
Cholesterol, Total: 135 mg/dL (ref 100–199)
HDL: 34 mg/dL — ABNORMAL LOW (ref >39)
LDL Calculated: 71 mg/dL (ref 0–99)
Triglycerides: 150 mg/dL — ABNORMAL HIGH (ref 0–149)
VLDL Cholesterol Cal: 30 mg/dL (ref 5–40)

## 2014-08-22 LAB — BASIC METABOLIC PANEL
BUN: 65 mg/dL — AB (ref 4–21)
Creatinine: 2.9 mg/dL — AB (ref 0.6–1.3)
Glucose: 133 mg/dL
Potassium: 4.8 mmol/L (ref 3.4–5.3)
Sodium: 139 mmol/L (ref 137–147)

## 2014-08-22 LAB — AMBIG ABBREV HFP7 DEFAULT

## 2014-08-22 LAB — AMBIG ABBREV LP DEFAULT

## 2014-08-25 ENCOUNTER — Telehealth: Payer: Self-pay | Admitting: Family Medicine

## 2014-08-25 NOTE — Telephone Encounter (Signed)
Please call patient to get his BMP results. His creatinine was 2.88, electrolytes were normal.

## 2014-08-26 NOTE — Telephone Encounter (Signed)
Pt informed.James Burke Lynetta  

## 2014-08-28 ENCOUNTER — Encounter: Payer: Self-pay | Admitting: *Deleted

## 2014-09-11 ENCOUNTER — Other Ambulatory Visit: Payer: Self-pay | Admitting: *Deleted

## 2014-09-11 MED ORDER — CARVEDILOL 25 MG PO TABS: 25.0000 mg | ORAL_TABLET | Freq: Two times a day (BID) | ORAL | Status: DC

## 2014-09-17 ENCOUNTER — Encounter: Payer: Self-pay | Admitting: Cardiology

## 2014-09-17 ENCOUNTER — Ambulatory Visit (INDEPENDENT_AMBULATORY_CARE_PROVIDER_SITE_OTHER): Payer: BC Managed Care – PPO | Admitting: Cardiology

## 2014-09-17 ENCOUNTER — Encounter: Payer: Self-pay | Admitting: *Deleted

## 2014-09-17 VITALS — BP 126/82 | HR 77 | Ht 72.0 in | Wt 195.0 lb

## 2014-09-17 DIAGNOSIS — I428 Other cardiomyopathies: Secondary | ICD-10-CM | POA: Insufficient documentation

## 2014-09-17 DIAGNOSIS — I3139 Other pericardial effusion (noninflammatory): Secondary | ICD-10-CM

## 2014-09-17 DIAGNOSIS — I319 Disease of pericardium, unspecified: Secondary | ICD-10-CM

## 2014-09-17 DIAGNOSIS — I313 Pericardial effusion (noninflammatory): Secondary | ICD-10-CM

## 2014-09-17 DIAGNOSIS — R079 Chest pain, unspecified: Secondary | ICD-10-CM

## 2014-09-17 NOTE — Assessment & Plan Note (Signed)
Blood pressure controlled. Continue present medications. 

## 2014-09-17 NOTE — Progress Notes (Signed)
      HPI: FU congestive heart failure. Patient has had congestive heart failure for approximately 8-10 years. He was previously followed in [facility] by Dr. Olena Heckle. He has not had a cardiac catheterization previously by his report. Patient had an echocardiogram in August of 2015 that showed an ejection fraction of 40-45% with inferior lateral akinesis. There was a moderate pericardial effusion. There was grade 2 diastolic dysfunction and severe left atrial enlargement. Baseline creatinine is approximately 2.52. Since he was last seen, patient denies dyspnea, chest pain, palpitations or syncope.  Current Outpatient Prescriptions  Medication Sig Dispense Refill  . AMBULATORY NON FORMULARY MEDICATION Medication Name: glucometer, strips and lancet Dx 250.02 Test twice a day. 100 Units pRN  . aspirin 81 MG tablet Take 81 mg by mouth daily.    Marland Kitchen atorvastatin (LIPITOR) 10 MG tablet Take 1 tablet (10 mg total) by mouth daily. 90 tablet 3  . carvedilol (COREG) 25 MG tablet Take 1 tablet (25 mg total) by mouth 2 (two) times daily with a meal. 60 tablet 5  . furosemide (LASIX) 40 MG tablet Take 1 tablet (40 mg total) by mouth daily. 30 tablet 6  . glipiZIDE (GLUCOTROL) 10 MG tablet TAKE 1 TABLET BY MOUTH TWICE DAILY PRIOR TO MEALS 60 tablet 2  . lisinopril-hydrochlorothiazide (PRINZIDE,ZESTORETIC) 20-12.5 MG per tablet Take 1 tablet by mouth daily.    . Potassium Chloride ER 20 MEQ TBCR Take 40 mg by mouth daily. 60 tablet 2  . tamsulosin (FLOMAX) 0.4 MG CAPS capsule TAKE 1 CAPSULE BY MOUTH DAILY AFTER SUPPER 30 capsule 1  . zolpidem (AMBIEN) 10 MG tablet TAKE 1 TABLET(S) BY MOUTH AT BEDTIME AS NEEDED FOR SLEEP 30 tablet 0   No current facility-administered medications for this visit.     Past Medical History  Diagnosis Date  . Diabetes mellitus   . Congestive heart failure   . C. difficile colitis 04/18/2008  . Renal insufficiency   . GERD (gastroesophageal reflux disease)   . Arthritis,  septic, knee     Past Surgical History  Procedure Laterality Date  . Appendectomy  1995  . Knee surgery      History   Social History  . Marital Status: Married    Spouse Name: N/A    Number of Children: 3  . Years of Education: N/A   Occupational History  . Not on file.   Social History Main Topics  . Smoking status: Never Smoker   . Smokeless tobacco: Not on file  . Alcohol Use: No  . Drug Use: No  . Sexual Activity: Yes   Other Topics Concern  . Not on file   Social History Narrative    ROS: some pain in knee from recent surgery but no fevers or chills, productive cough, hemoptysis, dysphasia, odynophagia, melena, hematochezia, dysuria, hematuria, rash, seizure activity, orthopnea, PND, pedal edema, claudication. Remaining systems are negative.  Physical Exam: Well-developed well-nourished in no acute distress.  Skin is warm and dry.  HEENT is normal.  Neck is supple.  Chest is clear to auscultation with normal expansion.  Cardiovascular exam is regular rate and rhythm.  Abdominal exam nontender or distended. No masses palpated. Extremities show no edema. neuro grossly intact

## 2014-09-17 NOTE — Assessment & Plan Note (Signed)
Repeat echocardiogram. 

## 2014-09-17 NOTE — Patient Instructions (Signed)
Your physician wants you to follow-up in: [place] will receive a reminder letter in the mail two months in advance. If you don't receive a letter, please call our office to schedule the follow-up appointment.   Your physician has requested that you have an echocardiogram. Echocardiography is a painless test that uses sound waves to create images of your heart. It provides your doctor with information about the size and shape of your heart and how well your heart's chambers and valves are working. This procedure takes approximately one hour. There are no restrictions for this procedure.   Your physician has requested that you have a lexiscan myoview. For further information please visit [email or web address]. Please follow instruction sheet, as given.

## 2014-09-17 NOTE — Assessment & Plan Note (Signed)
Patient remained euvolemic on examination. Continue present dose of diuretics. Continue beta blocker and ACE inhibitor. Obtain records from Dr. Olena Heckle office in [facility].

## 2014-09-17 NOTE — Assessment & Plan Note (Signed)
Continue ACE inhibitor and beta blocker. Schedule nuclear study to screen for coronary disease. He would be high risk for contrast nephropathy if cardiac catheterization ever needed.

## 2014-09-19 ENCOUNTER — Other Ambulatory Visit: Payer: Self-pay | Admitting: Sports Medicine

## 2014-09-19 ENCOUNTER — Other Ambulatory Visit: Payer: Self-pay | Admitting: Family Medicine

## 2014-09-23 ENCOUNTER — Telehealth: Payer: Self-pay | Admitting: Cardiology

## 2014-09-23 ENCOUNTER — Other Ambulatory Visit: Payer: Self-pay

## 2014-09-23 MED ORDER — GLIPIZIDE 10 MG PO TABS: 10.0000 mg | ORAL_TABLET | Freq: Two times a day (BID) | ORAL | Status: DC

## 2014-09-23 MED ORDER — CARVEDILOL 25 MG PO TABS: 25.0000 mg | ORAL_TABLET | Freq: Two times a day (BID) | ORAL | Status: DC

## 2014-09-23 NOTE — Telephone Encounter (Signed)
Faxed signed Release for records to [facility]- Dr Quillian Quince as requested by Dr Stanford Breed.

## 2014-09-29 ENCOUNTER — Telehealth (HOSPITAL_COMMUNITY): Payer: Self-pay | Admitting: *Deleted

## 2014-10-03 ENCOUNTER — Telehealth (HOSPITAL_COMMUNITY): Payer: Self-pay

## 2014-10-03 NOTE — Telephone Encounter (Signed)
Encounter complete. 

## 2014-10-08 ENCOUNTER — Ambulatory Visit (HOSPITAL_COMMUNITY)
Admission: RE | Admit: 2014-10-08 | Discharge: 2014-10-08 | Disposition: A | Discharge status: Home / Self Care | Payer: BLUE CROSS/BLUE SHIELD | Source: Ambulatory Visit | Attending: Cardiology | Admitting: Cardiology

## 2014-10-08 ENCOUNTER — Ambulatory Visit (HOSPITAL_COMMUNITY): Payer: Self-pay

## 2014-10-08 DIAGNOSIS — R079 Chest pain, unspecified: Secondary | ICD-10-CM

## 2014-10-08 DIAGNOSIS — E119 Type 2 diabetes mellitus without complications: Secondary | ICD-10-CM | POA: Insufficient documentation

## 2014-10-08 DIAGNOSIS — I509 Heart failure, unspecified: Secondary | ICD-10-CM | POA: Insufficient documentation

## 2014-10-08 DIAGNOSIS — I429 Cardiomyopathy, unspecified: Secondary | ICD-10-CM | POA: Insufficient documentation

## 2014-10-08 DIAGNOSIS — N183 Chronic kidney disease, stage 3 (moderate): Secondary | ICD-10-CM | POA: Diagnosis not present

## 2014-10-08 MED ORDER — TECHNETIUM TC 99M SESTAMIBI GENERIC - CARDIOLITE
31.9000 | Freq: Once | INTRAVENOUS | Status: AC | PRN
Start: 2014-10-08 — End: 2014-10-08
  Administered 2014-10-08: 31.9 via INTRAVENOUS

## 2014-10-08 MED ORDER — REGADENOSON 0.4 MG/5ML IV SOLN: 0.4000 mg | Freq: Once | INTRAVENOUS | Status: DC

## 2014-10-08 MED ORDER — TECHNETIUM TC 99M SESTAMIBI GENERIC - CARDIOLITE: 10.4000 | Freq: Once | INTRAVENOUS | Status: AC | PRN

## 2014-10-08 MED ORDER — TECHNETIUM TC 99M SESTAMIBI GENERIC - CARDIOLITE
10.4000 | Freq: Once | INTRAVENOUS | Status: AC | PRN
Start: 2014-10-08 — End: 2014-10-08
  Administered 2014-10-08: 10 via INTRAVENOUS

## 2014-10-08 MED ORDER — REGADENOSON 0.4 MG/5ML IV SOLN
0.4000 mg | Freq: Once | INTRAVENOUS | Status: AC
  Administered 2014-10-08: 0.4 mg via INTRAVENOUS

## 2014-10-08 MED ORDER — AMINOPHYLLINE 25 MG/ML IV SOLN: 100.0000 mg | Freq: Once | INTRAVENOUS | Status: DC

## 2014-10-08 MED ORDER — AMINOPHYLLINE 25 MG/ML IV SOLN
100.0000 mg | Freq: Once | INTRAVENOUS | Status: AC
  Administered 2014-10-08: 100 mg via INTRAVENOUS

## 2014-10-08 MED ORDER — TECHNETIUM TC 99M SESTAMIBI GENERIC - CARDIOLITE: 31.9000 | Freq: Once | INTRAVENOUS | Status: AC | PRN

## 2014-10-08 NOTE — Procedures (Addendum)
[facility] [address] [place] [address] [id number]  Cardiology Nuclear Med Study  James Burke is a 49 y.o. male     MRN : [id number]     DOB: 1966-04-02  Procedure Date: 10/08/2014  Nuclear Med Background Indication for Stress Test:  Evaluation for Ischemia and Follow up CHF History:  CHF;CKD III;CONGESTIVE DIALATED CARDIOMYOPATHY Cardiac Risk Factors: Lipids and NIDDM  Symptoms:  Pt denies symptoms at this time.   Nuclear Pre-Procedure Caffeine/Decaff Intake:  9:00pm NPO After: 7:00am   IV Site: R Forearm  IV 0.9% NS with Angio Cath:  22g  Chest Size (in):  48"  IV Started by: Rolene Course, RN  Height: 6' (1.829 m)  Cup Size: n/a  BMI:  Body mass index is 26.44 kg/(m^2). Weight:  195 lb (88.451 kg)   Tech Comments:  n/a    Nuclear Med Study 1 or 2 day study: 1 day  Stress Test Type:  [place] Provider:  Kirk Ruths, MD   Resting Radionuclide: Technetium 57m Sestamibi  Resting Radionuclide Dose: 10.4 mCi   Stress Radionuclide:  Technetium 30m Sestamibi  Stress Radionuclide Dose: 31.9 mCi           Stress Protocol Rest HR: 75 Stress HR: 81  Rest BP: 140/90 Stress BP: 152/58  Exercise Time (min): n/a METS: n/a   Predicted Max HR: 174 bpm % Max HR: 48.85 bpm Rate Pressure Product: 12920  Dose of Adenosine (mg):  n/a Dose of Lexiscan: 0.4 mg  Dose of Atropine (mg): n/a Dose of Dobutamine: n/a mcg/kg/min (at max HR)  Stress Test Technologist: Leane Para, CCT Nuclear Technologist: Imagene Riches, CNMT   Rest Procedure:  Myocardial perfusion imaging was performed at rest 45 minutes following the intravenous administration of Technetium 17m Sestamibi. Stress Procedure:  The patient received IV Lexiscan 0.4 mg over 15-seconds.  Technetium 54m Sestamibi injected IV at 30-seconds. Patient experienced SOB, Dizziness and headache and 100 mg Aminophylline IV was administered. There were  no significant changes with Lexiscan.  Quantitative spect images were obtained after a 45 minute delay.  Transient Ischemic Dilatation (Normal <1.22):  1.10  QGS EDV:  252 ml QGS ESV:  171 ml LV Ejection Fraction: 32%     Rest ECG: NSR-LVH  Stress ECG: No significant change from baseline ECG  QPS Raw Data Images:  Cardiomegaly. Moderate diaphragmatci attenuation Stress Images:  Moderate severity, moderate size inferior wall perfusion abnormality Rest Images:  Comparison with the stress images reveals no significant change. Subtraction (SDS):  There is a fixed inferior defect that is most consistent with diaphragmatic attenuation. LV Wall Motion:  Global hypokinesis, moderate to severe reduction in overall LV systolic function.  Impression Exercise Capacity:  Lexiscan with no exercise. BP Response:  Normal blood pressure response. Clinical Symptoms:  No significant symptoms noted. ECG Impression:  No significant ECG changes with Lexiscan. Comparison with Prior Nuclear Study: No images to compare   Overall Impression:  Intermediate risk stress nuclear study with moderate to severe reduction in overall LV systolic function. There is a moderate size fixed inferior wall perfusion abnormality. Inferior wall motion hypokinesis is proportionate to remainder of left ventricle. 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   [name], MD  10/08/2014 3:33 PM

## 2014-10-15 ENCOUNTER — Ambulatory Visit (HOSPITAL_COMMUNITY)
Admission: RE | Admit: 2014-10-15 | Discharge: 2014-10-15 | Disposition: A | Discharge status: Home / Self Care | Payer: BLUE CROSS/BLUE SHIELD | Source: Ambulatory Visit | Attending: Cardiology | Admitting: Cardiology

## 2014-10-15 ENCOUNTER — Ambulatory Visit (INDEPENDENT_AMBULATORY_CARE_PROVIDER_SITE_OTHER): Payer: BLUE CROSS/BLUE SHIELD | Admitting: Family Medicine

## 2014-10-15 DIAGNOSIS — I313 Pericardial effusion (noninflammatory): Secondary | ICD-10-CM

## 2014-10-15 DIAGNOSIS — I319 Disease of pericardium, unspecified: Secondary | ICD-10-CM

## 2014-10-15 DIAGNOSIS — I3139 Other pericardial effusion (noninflammatory): Secondary | ICD-10-CM

## 2014-10-15 NOTE — Progress Notes (Signed)
2D Echo Performed 10/15/2014    Marygrace Drought, RCS

## 2014-10-15 NOTE — Progress Notes (Signed)
   Subjective:    Patient ID: James Burke, male    DOB: 31-Oct-1965, 49 y.o.   MRN: [id number]  HPI Patient has been deemed a "no-show" for today's scheduled appointment.  Based on chief complaint and my chart review:  -Follow-up advised.  Contacting patient and urged to schedule visit in 1-3 weeks.      Review of Systems     Objective:   Physical Exam        Assessment & Plan:

## 2014-10-19 ENCOUNTER — Other Ambulatory Visit: Payer: Self-pay | Admitting: Family Medicine

## 2014-10-20 ENCOUNTER — Other Ambulatory Visit: Payer: Self-pay | Admitting: *Deleted

## 2014-10-20 MED ORDER — ZOLPIDEM TARTRATE 10 MG PO TABS: ORAL_TABLET | ORAL | Status: DC

## 2014-10-21 ENCOUNTER — Other Ambulatory Visit: Payer: Self-pay | Admitting: *Deleted

## 2014-10-21 MED ORDER — ZOLPIDEM TARTRATE 10 MG PO TABS: ORAL_TABLET | ORAL | Status: DC

## 2014-10-24 ENCOUNTER — Ambulatory Visit (INDEPENDENT_AMBULATORY_CARE_PROVIDER_SITE_OTHER): Payer: BLUE CROSS/BLUE SHIELD | Admitting: Family Medicine

## 2014-10-24 ENCOUNTER — Ambulatory Visit (INDEPENDENT_AMBULATORY_CARE_PROVIDER_SITE_OTHER): Payer: BLUE CROSS/BLUE SHIELD

## 2014-10-24 ENCOUNTER — Encounter: Payer: Self-pay | Admitting: Family Medicine

## 2014-10-24 ENCOUNTER — Telehealth: Payer: Self-pay | Admitting: Family Medicine

## 2014-10-24 VITALS — BP 121/77 | HR 87 | Temp 98.1°F | Wt 198.0 lb

## 2014-10-24 DIAGNOSIS — M549 Dorsalgia, unspecified: Secondary | ICD-10-CM | POA: Diagnosis not present

## 2014-10-24 DIAGNOSIS — M4186 Other forms of scoliosis, lumbar region: Secondary | ICD-10-CM

## 2014-10-24 LAB — POCT URINALYSIS DIPSTICK
Bilirubin, UA: NEGATIVE
Color, UA: NEGATIVE
Glucose, UA: NEGATIVE
Ketones, UA: NEGATIVE
Leukocytes, UA: NEGATIVE
Nitrite, UA: NEGATIVE
Spec Grav, UA: 1.02
Urobilinogen, UA: 0.2
pH, UA: 5.5

## 2014-10-24 MED ORDER — TRAMADOL HCL 50 MG PO TABS: 50.0000 mg | ORAL_TABLET | Freq: Three times a day (TID) | ORAL | Status: DC | PRN

## 2014-10-24 MED ORDER — METHOCARBAMOL 500 MG PO TABS: 500.0000 mg | ORAL_TABLET | Freq: Four times a day (QID) | ORAL | Status: DC | PRN

## 2014-10-24 NOTE — Progress Notes (Signed)
CC: James Burke is a 49 y.o. male is here for kidney pain/back pain   Subjective: HPI:  Left low back pain that has been present for the last 24 hours worsening since onset gradually. Started When he was getting dressed. Worse when twisting to the left or leaning to the left or standing for long period of time. Improves if he tilts to the right or sits on his right butt cheek. Symptoms are worse when he tries to get up from a seated position as well. He denies any recent or remote trauma or overexertion. Pain is nonradiating, described as a pulling and stabbing sensation moderate in severity but almost absent in the alleviating positions described above. Denies any dysuria, change in color or consistency of his urine. Denies flank pain, denies radiation of pain into the groin or scrotum. No urinary urgency or hesitancy. No gastrointestinal complaints. Pain was not any different after having a normal bowel movement today.  Denies fevers, chills, nor midline back pain no motor or sensory disturbances and lower charities or rash anywhere.   Review Of Systems Outlined In HPI  Past Medical History  Diagnosis Date  . Diabetes mellitus   . Congestive heart failure   . C. difficile colitis 04/18/2008  . Renal insufficiency   . GERD (gastroesophageal reflux disease)   . Arthritis, septic, knee     Past Surgical History  Procedure Laterality Date  . Appendectomy  1995  . Knee surgery     Family History  Problem Relation Age of Onset  . Heart disease      No family history    History   Social History  . Marital Status: Married    Spouse Name: N/A  . Number of Children: 3  . Years of Education: N/A   Occupational History  . Not on file.   Social History Main Topics  . Smoking status: Never Smoker   . Smokeless tobacco: Not on file  . Alcohol Use: No  . Drug Use: No  . Sexual Activity: Yes   Other Topics Concern  . Not on file   Social History Narrative     Objective: BP  121/77 mmHg  Pulse 87  Temp(Src) 98.1 F (36.7 C) (Oral)  Wt 198 lb (89.812 kg)  General: Alert and Oriented, No Acute Distress HEENT: Pupils equal, round, reactive to light. Conjunctivae clear.  Moist mucous membranes Lungs: Clear and comfortable work of breathing Cardiac: Regular rate and rhythm.  Abdomen: Soft nontender Back: No CVA tenderness on the left, no midline spinous process tenderness. Pain is reproduced with palpation of the left paraspinal musculature around L3 and L4. Full range of motion and strength in the lumbar spine. Extremities: No peripheral edema.  Strong peripheral pulses.  Mental Status: No depression, anxiety, nor agitation. Skin: Warm and dry.  Assessment & Plan: James Burke was seen today for kidney pain/back pain.  Diagnoses and all orders for this visit:  Back pain, unspecified location Orders: -     Urinalysis Dipstick -     methocarbamol (ROBAXIN) 500 MG tablet; Take 1 tablet (500 mg total) by mouth 4 (four) times daily as needed for muscle spasms. -     traMADol (ULTRAM) 50 MG tablet; Take 1 tablet (50 mg total) by mouth every 8 (eight) hours as needed. -     DG Lumbar Spine Complete; Future   His urinalysis shows blood and a high degree of protein however this appears to be normal for him with his  chronic kidney disease. Very low suspicion for nephrolithiasis or pyelonephritis. Suspect this is due to muscular strain, since I can't reproduce his pain 100% with palpation to get an x-ray to make sure he does not have a compression fracture.   Return if symptoms worsen or fail to improve.

## 2014-10-24 NOTE — Telephone Encounter (Signed)
Pt.notified

## 2014-10-24 NOTE — Telephone Encounter (Signed)
James Burke, Will you please let patient know that his xrays show some mild scoliosis of the lumber spine which can set him up for the type of pain he's experiencing.  No change to the medication plan I Rxed him today, go ahead and get them filled.  I not improving by next week the next step would be physical therapy.  I'll let him know if the radiologist catches anything that I missed when looking at his films.

## 2014-10-29 ENCOUNTER — Encounter: Payer: Self-pay | Admitting: Family Medicine

## 2014-10-29 ENCOUNTER — Ambulatory Visit (INDEPENDENT_AMBULATORY_CARE_PROVIDER_SITE_OTHER): Payer: BLUE CROSS/BLUE SHIELD | Admitting: Family Medicine

## 2014-10-29 ENCOUNTER — Telehealth: Payer: Self-pay | Admitting: Family Medicine

## 2014-10-29 VITALS — BP 110/62 | HR 77 | Wt 199.0 lb

## 2014-10-29 DIAGNOSIS — E11311 Type 2 diabetes mellitus with unspecified diabetic retinopathy with macular edema: Secondary | ICD-10-CM

## 2014-10-29 DIAGNOSIS — N058 Unspecified nephritic syndrome with other morphologic changes: Secondary | ICD-10-CM | POA: Diagnosis not present

## 2014-10-29 DIAGNOSIS — I10 Essential (primary) hypertension: Secondary | ICD-10-CM | POA: Diagnosis not present

## 2014-10-29 DIAGNOSIS — Z1211 Encounter for screening for malignant neoplasm of colon: Secondary | ICD-10-CM

## 2014-10-29 DIAGNOSIS — E1129 Type 2 diabetes mellitus with other diabetic kidney complication: Secondary | ICD-10-CM | POA: Diagnosis not present

## 2014-10-29 DIAGNOSIS — I5022 Chronic systolic (congestive) heart failure: Secondary | ICD-10-CM | POA: Diagnosis not present

## 2014-10-29 LAB — POCT GLYCOSYLATED HEMOGLOBIN (HGB A1C): Hemoglobin A1C: 6.8

## 2014-10-29 MED ORDER — TAMSULOSIN HCL 0.4 MG PO CAPS: ORAL_CAPSULE | ORAL | Status: DC

## 2014-10-29 MED ORDER — POTASSIUM CHLORIDE ER 20 MEQ PO TBCR: 40.0000 mg | EXTENDED_RELEASE_TABLET | Freq: Every day | ORAL | Status: DC

## 2014-10-29 MED ORDER — FUROSEMIDE 40 MG PO TABS: 40.0000 mg | ORAL_TABLET | Freq: Every day | ORAL | Status: DC

## 2014-10-29 NOTE — Telephone Encounter (Signed)
I called and left a detailed message on Dr Serita Grit medical assistant's voicemail.    [facility]: Ulla Potash MD [address] [place], [place] [address] [phone]

## 2014-10-29 NOTE — Telephone Encounter (Signed)
Pleas call Dr. Serita Grit nurse and see if we could start himon metformin based on his kidney function.

## 2014-10-29 NOTE — Progress Notes (Signed)
   Subjective:    Patient ID: James Burke, male    DOB: 1966/03/27, 49 y.o.   MRN: [id number]  HPI Diabetes - no hypoglycemic events. No wounds or sores that are not healing well. No increased thirst or urination. Checking glucose at home. Taking medications as prescribed without any side effects. Not on metform due to renal dz. he knows he is well overdue for an eye exam.  Hypertension- Pt denies chest pain, SOB, dizziness, or heart palpitations.  Taking meds as directed w/o problems.  Denies medication side effects.    Heart failure - has stress recently that was fairly normal.  No recent chest pain or short of breath.   Review of Systems     Objective:   Physical Exam  Constitutional: He is oriented to person, place, and time. He appears well-developed and well-nourished.  HENT:  Head: Normocephalic and atraumatic.  Cardiovascular: Normal rate, regular rhythm and normal heart sounds.   Pulmonary/Chest: Effort normal and breath sounds normal.  Neurological: He is alert and oriented to person, place, and time.  Skin: Skin is warm and dry.  Psychiatric: He has a normal mood and affect. His behavior is normal.          Assessment & Plan:  DM- well controlled. A1C is 6.8.  He is on a statin.  Will call to get him scheduled with [facility]. Cotton got him Implanon appointment for an eye exam before he left the office today.  HTN - repeat BP at goal.  F/U in 3-4 months.   Diabetic retinopathy-will get him back in for an eye exam as soon as possible. Next  Chronic systolic heart failure-following with cardiology. Recent stress test with good results.  Discussed need for screening colonoscopy. He is ok with referral.

## 2014-10-30 NOTE — Telephone Encounter (Signed)
James Burke from Dr Serita Grit office left a detailed vm this morning that at James Burke's current GFR he does not recommend metformin at this time.  She said that he has an appt next week on 3/2 with Dr. Posey Pronto.

## 2014-10-30 NOTE — Telephone Encounter (Signed)
Pt informed.James Burke  

## 2014-10-30 NOTE — Telephone Encounter (Signed)
Please call [facility] and let him know what Dr. Posey Pronto said.

## 2014-11-03 LAB — VITAMIN D 25 HYDROXY (VIT D DEFICIENCY, FRACTURES): Vit D, 25-Hydroxy: 30.8

## 2014-11-03 LAB — BASIC METABOLIC PANEL
Creatinine: 2.8 mg/dL — AB (ref 0.6–1.3)
Potassium: 5 mmol/L (ref 3.4–5.3)
Sodium: 139 mmol/L (ref 137–147)

## 2014-11-03 LAB — CBC AND DIFFERENTIAL: Hemoglobin: 9.8 g/dL — AB (ref 13.5–17.5)

## 2014-11-03 LAB — CMP14+LP+1AC+CBC/D/PLT+TSH: EGFR (Non-African Amer.): 25

## 2014-11-03 LAB — PTH, INTACT AND CALCIUM: PTH: 37 pg/mL

## 2014-11-07 ENCOUNTER — Encounter: Payer: Self-pay | Admitting: Family Medicine

## 2014-11-07 MED ORDER — LISINOPRIL-HYDROCHLOROTHIAZIDE 20-12.5 MG PO TABS: 1.0000 | ORAL_TABLET | Freq: Every day | ORAL | Status: DC

## 2014-11-14 ENCOUNTER — Encounter: Payer: Self-pay | Admitting: Family Medicine

## 2014-11-25 LAB — HM DIABETES EYE EXAM

## 2014-12-09 ENCOUNTER — Encounter: Payer: Self-pay | Admitting: Family Medicine

## 2014-12-18 ENCOUNTER — Other Ambulatory Visit: Payer: Self-pay | Admitting: Family Medicine

## 2014-12-18 MED ORDER — ZOLPIDEM TARTRATE 10 MG PO TABS: ORAL_TABLET | ORAL | Status: DC

## 2014-12-29 ENCOUNTER — Other Ambulatory Visit: Payer: Self-pay | Admitting: Family Medicine

## 2015-01-28 ENCOUNTER — Ambulatory Visit (INDEPENDENT_AMBULATORY_CARE_PROVIDER_SITE_OTHER): Payer: BLUE CROSS/BLUE SHIELD | Admitting: Family Medicine

## 2015-01-28 ENCOUNTER — Encounter: Payer: Self-pay | Admitting: Family Medicine

## 2015-01-28 VITALS — BP 133/75 | HR 77 | Ht 72.0 in | Wt 196.0 lb

## 2015-01-28 DIAGNOSIS — E1129 Type 2 diabetes mellitus with other diabetic kidney complication: Secondary | ICD-10-CM | POA: Diagnosis not present

## 2015-01-28 DIAGNOSIS — E11311 Type 2 diabetes mellitus with unspecified diabetic retinopathy with macular edema: Secondary | ICD-10-CM

## 2015-01-28 DIAGNOSIS — I5022 Chronic systolic (congestive) heart failure: Secondary | ICD-10-CM

## 2015-01-28 DIAGNOSIS — N058 Unspecified nephritic syndrome with other morphologic changes: Secondary | ICD-10-CM | POA: Diagnosis not present

## 2015-01-28 DIAGNOSIS — L84 Corns and callosities: Secondary | ICD-10-CM | POA: Diagnosis not present

## 2015-01-28 DIAGNOSIS — N184 Chronic kidney disease, stage 4 (severe): Secondary | ICD-10-CM

## 2015-01-28 DIAGNOSIS — E1122 Type 2 diabetes mellitus with diabetic chronic kidney disease: Secondary | ICD-10-CM | POA: Diagnosis not present

## 2015-01-28 LAB — POCT GLYCOSYLATED HEMOGLOBIN (HGB A1C): Hemoglobin A1C: 7.1

## 2015-01-28 MED ORDER — DULAGLUTIDE 0.75 MG/0.5ML ~~LOC~~ SOAJ: 0.7500 mg | SUBCUTANEOUS | Status: DC

## 2015-01-28 NOTE — Progress Notes (Signed)
   Subjective:    Patient ID: James Burke, male    DOB: 1966/08/16, 49 y.o.   MRN: [id number]  HPI Diabetes - no hypoglycemic events. He hs a tichk callous on the heel of his left foot he would like me to look at today. He's concerned because he's had callus form there before which turned into a diabetic foot ulcer. He admits he has not been using the bag balm moisturizer that he typically uses. No increased thirst or urination. Checking glucose at home. Taking medications as prescribed without any side effects.  He is now stage 4 kidney function. CKD-4. Told to dec dairy intake. Now drinking soy milk.    Diabetic retinopathy - did have eye exam. Has had laser tx on the retina.  He is doing well. Still has 20/20 vision.   CHF, systolic- He is doing well. No inc swelling or SOB.    Review of Systems      Objective:   Physical Exam  Constitutional: He is oriented to person, place, and time. He appears well-developed and well-nourished.  HENT:  Head: Normocephalic and atraumatic.  Cardiovascular: Normal rate, regular rhythm and normal heart sounds.   Pulmonary/Chest: Effort normal and breath sounds normal.  Neurological: He is alert and oriented to person, place, and time.  Skin: Skin is warm and dry.  Psychiatric: He has a normal mood and affect. His behavior is normal.          Assessment & Plan:  DM- uncontrolled.  A1C is 7. Discussed options. Consider GLP-1 agonist like Trulicity to better control sugars. Script sent. No renal or hepatic adjustments made.  Callus on heel of the left foot-used a #15 blade to debridement the thick callus. Encouraged him to moisturize the area aggressively. And if it starts to reaccumulate come back in so that we can remove as much of it is possible to avoid getting an ulceration underneath.  CKD- 4 - Now on limited potassium diet.  Following with Nephrology regularly.   Diabetic retinopathy- under current tx  CHF, systolic - Stable. No  recent exacerbations.

## 2015-02-16 ENCOUNTER — Other Ambulatory Visit: Payer: Self-pay | Admitting: Family Medicine

## 2015-03-02 LAB — VITAMIN D 25 HYDROXY (VIT D DEFICIENCY, FRACTURES): Vit D, 25-Hydroxy: 28.1

## 2015-03-02 LAB — COMPLETE METABOLIC PANEL WITH GFR
Calcium, Ser: 9.4
Chloride: 96 mmol/L
EGFR (Non-African Amer.): 19
Phosphorus: 4.2

## 2015-03-02 LAB — PTH, INTACT: PTH Interp: 33

## 2015-03-02 LAB — MAGNESIUM: Magnesium: 2.4

## 2015-03-04 LAB — BASIC METABOLIC PANEL
BUN: 83 mg/dL — AB (ref 4–21)
Creatinine: 3.6 mg/dL — AB (ref 0.6–1.3)
Glucose: 172 mg/dL
Potassium: 4.8 mmol/L (ref 3.4–5.3)
Sodium: 138 mmol/L (ref 137–147)

## 2015-03-04 LAB — CBC AND DIFFERENTIAL
Hemoglobin: 10.3 g/dL — AB (ref 13.5–17.5)
Platelets: 184 10*3/uL (ref 150–399)
WBC: 8 10^3/mL

## 2015-03-13 ENCOUNTER — Other Ambulatory Visit: Payer: Self-pay | Admitting: *Deleted

## 2015-03-13 DIAGNOSIS — Z01810 Encounter for preprocedural cardiovascular examination: Secondary | ICD-10-CM

## 2015-03-13 DIAGNOSIS — N184 Chronic kidney disease, stage 4 (severe): Secondary | ICD-10-CM

## 2015-04-01 ENCOUNTER — Other Ambulatory Visit: Payer: Self-pay | Admitting: Family Medicine

## 2015-04-02 NOTE — Telephone Encounter (Signed)
F/U in Aug

## 2015-04-10 ENCOUNTER — Encounter (HOSPITAL_COMMUNITY): Payer: BLUE CROSS/BLUE SHIELD

## 2015-04-10 ENCOUNTER — Other Ambulatory Visit (HOSPITAL_COMMUNITY): Payer: BLUE CROSS/BLUE SHIELD

## 2015-04-10 ENCOUNTER — Ambulatory Visit: Payer: BLUE CROSS/BLUE SHIELD | Admitting: Vascular Surgery

## 2015-04-16 ENCOUNTER — Other Ambulatory Visit: Payer: Self-pay | Admitting: Family Medicine

## 2015-04-29 ENCOUNTER — Ambulatory Visit (INDEPENDENT_AMBULATORY_CARE_PROVIDER_SITE_OTHER): Payer: BLUE CROSS/BLUE SHIELD | Admitting: Family Medicine

## 2015-04-29 ENCOUNTER — Encounter: Payer: Self-pay | Admitting: Family Medicine

## 2015-04-29 VITALS — BP 122/76 | HR 80 | Wt 191.0 lb

## 2015-04-29 DIAGNOSIS — N184 Chronic kidney disease, stage 4 (severe): Secondary | ICD-10-CM

## 2015-04-29 DIAGNOSIS — E1129 Type 2 diabetes mellitus with other diabetic kidney complication: Secondary | ICD-10-CM | POA: Diagnosis not present

## 2015-04-29 DIAGNOSIS — E1122 Type 2 diabetes mellitus with diabetic chronic kidney disease: Secondary | ICD-10-CM

## 2015-04-29 DIAGNOSIS — E781 Pure hyperglyceridemia: Secondary | ICD-10-CM

## 2015-04-29 DIAGNOSIS — Z114 Encounter for screening for human immunodeficiency virus [HIV]: Secondary | ICD-10-CM | POA: Diagnosis not present

## 2015-04-29 DIAGNOSIS — I10 Essential (primary) hypertension: Secondary | ICD-10-CM | POA: Diagnosis not present

## 2015-04-29 DIAGNOSIS — Z23 Encounter for immunization: Secondary | ICD-10-CM

## 2015-04-29 DIAGNOSIS — N058 Unspecified nephritic syndrome with other morphologic changes: Secondary | ICD-10-CM

## 2015-04-29 LAB — POCT UA - MICROALBUMIN
Albumin/Creatinine Ratio, Urine, POC: >300
Creatinine, POC: 100 mg/dL
Microalbumin Ur, POC: 150 mg/L

## 2015-04-29 LAB — POCT GLYCOSYLATED HEMOGLOBIN (HGB A1C): Hemoglobin A1C: 5.7

## 2015-04-29 MED ORDER — GLIPIZIDE 5 MG PO TABS: 5.0000 mg | ORAL_TABLET | Freq: Two times a day (BID) | ORAL | Status: DC

## 2015-04-29 NOTE — Patient Instructions (Signed)
Ok for 1800-2000 mg of sodium per day.

## 2015-04-29 NOTE — Progress Notes (Signed)
   Subjective:    Patient ID: James Burke, male    DOB: Apr 05, 1966, 49 y.o.   MRN: [id number]  HPI Diabetes - having a few hypoglycemic events. No wounds or sores that are not healing well. No increased thirst or urination. Checking glucose at home. Taking medications as prescribed without any side effects. Has been eaint really healthy.  Has lost about 5 lbs since I last saw him.    Hypertension- Pt denies chest pain, SOB, dizziness, or heart palpitations.  Taking meds as directed w/o problems.  Denies medication side effects.    CKD 4-he is considering peritoneal dialysis but is getting a fistula placed.   Review of Systems     Objective:   Physical Exam  Constitutional: He is oriented to person, place, and time. He appears well-developed and well-nourished.  HENT:  Head: Normocephalic and atraumatic.  Cardiovascular: Normal rate, regular rhythm and normal heart sounds.   Pulmonary/Chest: Effort normal and breath sounds normal.  Neurological: He is alert and oriented to person, place, and time.  Skin: Skin is warm and dry.  Psychiatric: He has a normal mood and affect. His behavior is normal.          Assessment & Plan:  DM- A1c of 5.7 today looks fantastic.Decrease glipizide to 5mg  BID since have some hypoglycemia. Foot exam done today.  F/U in 3 months.   HTN - well controlled.  Continue current regimen.  F/U in in 3-4 months.   CKD 4-it sounds like he is on a fantastic job and really controlling his sodium and potassium intake. He is concerned and wonders if he may be actually getting too low sodium. Encouraged him to shoot for around 1800-2000 mg per day.  Flu vaccine given today.   Due for screening colonoscopy

## 2015-05-07 ENCOUNTER — Other Ambulatory Visit: Payer: Self-pay | Admitting: Family Medicine

## 2015-05-18 ENCOUNTER — Other Ambulatory Visit: Payer: Self-pay | Admitting: Family Medicine

## 2015-05-27 ENCOUNTER — Other Ambulatory Visit: Payer: Self-pay | Admitting: Family Medicine

## 2015-06-03 ENCOUNTER — Encounter: Payer: Self-pay | Admitting: Vascular Surgery

## 2015-06-05 ENCOUNTER — Ambulatory Visit (INDEPENDENT_AMBULATORY_CARE_PROVIDER_SITE_OTHER): Payer: BLUE CROSS/BLUE SHIELD | Admitting: Vascular Surgery

## 2015-06-05 ENCOUNTER — Encounter: Payer: Self-pay | Admitting: Vascular Surgery

## 2015-06-05 ENCOUNTER — Ambulatory Visit (INDEPENDENT_AMBULATORY_CARE_PROVIDER_SITE_OTHER)
Admission: RE | Admit: 2015-06-05 | Discharge: 2015-06-05 | Disposition: A | Discharge status: Home / Self Care | Payer: BLUE CROSS/BLUE SHIELD | Source: Ambulatory Visit | Attending: Vascular Surgery | Admitting: Vascular Surgery

## 2015-06-05 ENCOUNTER — Ambulatory Visit (HOSPITAL_COMMUNITY)
Admission: RE | Admit: 2015-06-05 | Discharge: 2015-06-05 | Disposition: A | Discharge status: Home / Self Care | Payer: BLUE CROSS/BLUE SHIELD | Source: Ambulatory Visit | Attending: Vascular Surgery | Admitting: Vascular Surgery

## 2015-06-05 ENCOUNTER — Encounter: Payer: Self-pay | Admitting: Family Medicine

## 2015-06-05 VITALS — BP 143/88 | HR 76 | Ht 72.0 in | Wt 201.3 lb

## 2015-06-05 DIAGNOSIS — N184 Chronic kidney disease, stage 4 (severe): Secondary | ICD-10-CM | POA: Insufficient documentation

## 2015-06-05 DIAGNOSIS — Z01810 Encounter for preprocedural cardiovascular examination: Secondary | ICD-10-CM | POA: Insufficient documentation

## 2015-06-05 DIAGNOSIS — E1122 Type 2 diabetes mellitus with diabetic chronic kidney disease: Secondary | ICD-10-CM

## 2015-06-05 DIAGNOSIS — E119 Type 2 diabetes mellitus without complications: Secondary | ICD-10-CM | POA: Insufficient documentation

## 2015-06-05 NOTE — Progress Notes (Signed)
Referred by:  [name], [address] [place], [place] [address]  Reason for referral: New access  History of Present Illness  James Burke is a 49 y.o. (1966-04-03) male who presents for evaluation for permanent access.  The patient is right hand dominant.  The patient has not had previous access procedures.  Previous central venous cannulation procedures include: none.  The patient has never had a PPM placed.  The patient is going to proceed with PD but would like a back up HD access placed.  Pt has had a PICC placed in R arm previously.  Past Medical History  Diagnosis Date  . Diabetes mellitus   . Congestive heart failure   . C. difficile colitis 04/18/2008  . Renal insufficiency   . GERD (gastroesophageal reflux disease)   . Arthritis, septic, knee     Past Surgical History  Procedure Laterality Date  . Appendectomy  1995  . Knee surgery      Social History   Social History  . Marital Status: Married    Spouse Name: N/A  . Number of Children: 3  . Years of Education: N/A   Occupational History  . Not on file.   Social History Main Topics  . Smoking status: Never Smoker   . Smokeless tobacco: Current User    Types: Chew  . Alcohol Use: No  . Drug Use: No  . Sexual Activity: Yes   Other Topics Concern  . Not on file   Social History Narrative    Family History  Problem Relation Age of Onset  . Heart disease      No family history  . Cancer Brother     Current Outpatient Prescriptions  Medication Sig Dispense Refill  . amLODipine (NORVASC) 5 MG tablet     . atorvastatin (LIPITOR) 10 MG tablet Take 1 tablet (10 mg total) by mouth daily. 90 tablet 3  . carvedilol (COREG) 25 MG tablet TAKE 1 TABLET (25 MG TOTAL) BY MOUTH 2 (TWO) TIMES DAILY WITH A MEAL. 60 tablet 6  . furosemide (LASIX) 40 MG tablet Take 1 tablet (40 mg total) by mouth daily. 90 tablet 2  . glipiZIDE (GLUCOTROL) 5 MG tablet Take 1 tablet (5 mg total)  by mouth 2 (two) times daily before a meal. Place on hold as just filled previous Rx. 180 tablet 1  . Potassium Chloride ER 20 MEQ TBCR Take 40 mg by mouth daily. 180 tablet 2  . tamsulosin (FLOMAX) 0.4 MG CAPS capsule TAKE 1 CAPSULE BY MOUTH DAILY AFTER SUPPER 90 capsule 2  . AMBULATORY NON FORMULARY MEDICATION Medication Name: glucometer, strips and lancet Dx 250.02 Test twice a day. (Patient not taking: Reported on 06/05/2015) 100 Units pRN  . aspirin 81 MG tablet Take 81 mg by mouth daily.    Marland Kitchen oxyCODONE-acetaminophen (PERCOCET) 10-325 MG per tablet     . TRULICITY A999333 0000000 SOPN INJECT 0.75 MG INTO THE SKIN EVERY 7 (SEVEN) DAYS. (Patient not taking: Reported on 06/05/2015) 2 mL 1  . zolpidem (AMBIEN) 10 MG tablet TAKE 1 TABLET BY MOUTH AT BEDTIME AS NEEDED FOR SLEEP (Patient not taking: Reported on 06/05/2015) 30 tablet 0   No current facility-administered medications for this visit.    Allergies  Allergen Reactions  . Methocarbamol Diarrhea  . Prednisone     Other reaction(s): Other blindness  . Simvastatin Other (See Comments)    Joint aches.     REVIEW  OF SYSTEMS:  (Positives checked otherwise negative)  CARDIOVASCULAR:   [ ]  chest pain,  [ ]  chest pressure,  [ ]  palpitations,  [ ]  shortness of breath when laying flat,  [ ]  shortness of breath with exertion,   [ ]  pain in feet when walking,  [ ]  pain in feet when laying flat, [ ]  history of blood clot in veins (DVT),  [ ]  history of phlebitis,  [ ]  swelling in legs,  [ ]  varicose veins  PULMONARY:   [ ]  productive cough,  [ ]  asthma,  [ ]  wheezing  NEUROLOGIC:   [ ]  weakness in arms or legs,  [ ]  numbness in arms or legs,  [ ]  difficulty speaking or slurred speech,  [ ]  temporary loss of vision in one eye,  [ ]  dizziness  HEMATOLOGIC:   [ ]  bleeding problems,  [ ]  problems with blood clotting too easily  MUSCULOSKEL:   [x]  joint pain, [x]  joint swelling  GASTROINTEST:   [ ]  vomiting blood,  [ ]   blood in stool     GENITOURINARY:   [ ]  burning with urination,  [ ]  blood in urine  PSYCHIATRIC:   [ ]  history of major depression  INTEGUMENTARY:   [ ]  rashes,  [ ]  ulcers  CONSTITUTIONAL:   [ ]  fever,  [ ]  chills   Physical Examination  Filed Vitals:   06/05/15 1123  BP: 143/88  Pulse: 76  Height: 6' (1.829 m)  Weight: 201 lb 4.8 oz (91.309 kg)  SpO2: 100%   Body mass index is 27.3 kg/(m^2).  General: A&O x 3, WDWN  Head: [place]/AT  Ear/Nose/Throat: Hearing grossly intact, nares w/o erythema or drainage, oropharynx w/o Erythema/Exudate, Mallampati score: 3  Eyes: PERRLA, EOMI  Neck: Supple, no nuchal rigidity, no palpable LAD  Pulmonary: Sym exp, good air movt, CTAB, no rales, rhonchi, & wheezing  Cardiac: RRR, Nl S1, S2, no Murmurs, rubs or gallops  Vascular: Vessel Right Left  Radial Palpable Palpable  Ulnar Not Palpable Not Palpable  Brachial Palpable Palpable  Carotid Palpable, without bruit Palpable, without bruit  Aorta Not palpable N/A  Femoral Palpable Palpable  Popliteal Not palpable Not palpable  PT Faintly Palpable Faintly Palpable  DP Not Palpable Not Palpable   Gastrointestinal: soft, NTND, -G/R, - HSM, - masses, - CVAT B  Musculoskeletal: M/S 5/5 throughout , Extremities without ischemic changes   Neurologic: CN 2-12 intact , Pain and light touch intact in extremities , Motor exam as listed above  Psychiatric: Judgment intact, Mood & affect appropriate for pt's clinical situation  Dermatologic: See M/S exam for extremity exam, no rashes otherwise noted  Lymph : No Cervical, Axillary, or Inguinal lymphadenopathy    Non-Invasive Vascular Imaging  Vein Mapping  (Date: 06/05/2015):   R arm: acceptable vein conduits include possible upper arm basilic  L arm: acceptable vein conduits include upper arm basilic, marginal cephalic  BUE Doppler (Date: 06/05/2015):   R arm:   Brachial: tri,4.8 mm  Radial: tri,2.7 mm  Ulnar: tri,2.7  mm  L arm:   Brachial: tri,4.9 mm  Radial: tri,2.3 mm  Ulnar: tri,2.5 mm   Outside Studies/Documentation 6 pages of outside documents were reviewed including: outpatient nephrology chart with labs.   Medical Decision Making  James Burke is a 50 y.o. male who presents with chronic kidney disease stage IV   Pt is going to proceed with PD catheter placement as his main access route with HD as  back-up  Based on vein mapping and examination, this patient's permanent access options include: L arm BC AVF vs staged BVT, possible R staged BVT.  Would start with L BC AVF.  I had an extensive discussion with this patient in regards to the nature of access surgery, including risk, benefits, and alternatives.    The patient is aware that the risks of access surgery include but are not limited to: bleeding, infection, steal syndrome, nerve damage, ischemic monomelic neuropathy, failure of access to mature, and possible need for additional access procedures in the future.  I discussed with the patient the nature of the staged access procedure, specifically the need for a second operation to transpose the first stage fistula if it matures adequately.    The patient has agreed to proceed with the above procedure which will be scheduled as his schedule allows.   Adele Barthel, MD [facility] Office: [phone] Pager: [phone]  06/05/2015, 12:35 PM

## 2015-06-06 LAB — BASIC METABOLIC PANEL: Creatinine: 2.9 mg/dL — AB (ref <=1.3)

## 2015-06-16 ENCOUNTER — Other Ambulatory Visit: Payer: Self-pay | Admitting: Family Medicine

## 2015-06-25 ENCOUNTER — Other Ambulatory Visit: Payer: Self-pay | Admitting: Family Medicine

## 2015-07-16 ENCOUNTER — Other Ambulatory Visit: Payer: Self-pay | Admitting: Cardiology

## 2015-07-16 ENCOUNTER — Other Ambulatory Visit: Payer: Self-pay | Admitting: Family Medicine

## 2015-07-17 ENCOUNTER — Other Ambulatory Visit: Payer: Self-pay | Admitting: Family Medicine

## 2015-07-17 MED ORDER — ZOLPIDEM TARTRATE 10 MG PO TABS: 10.0000 mg | ORAL_TABLET | Freq: Every evening | ORAL | Status: DC | PRN

## 2015-07-24 ENCOUNTER — Other Ambulatory Visit: Payer: Self-pay | Admitting: Family Medicine

## 2015-07-29 ENCOUNTER — Ambulatory Visit: Payer: BLUE CROSS/BLUE SHIELD | Admitting: Family Medicine

## 2015-08-05 ENCOUNTER — Ambulatory Visit: Payer: BLUE CROSS/BLUE SHIELD | Admitting: Family Medicine

## 2015-08-15 ENCOUNTER — Other Ambulatory Visit: Payer: Self-pay | Admitting: Family Medicine

## 2015-08-18 ENCOUNTER — Other Ambulatory Visit: Payer: Self-pay | Admitting: Family Medicine

## 2015-08-18 MED ORDER — ZOLPIDEM TARTRATE 10 MG PO TABS: 10.0000 mg | ORAL_TABLET | Freq: Every evening | ORAL | Status: DC | PRN

## 2015-08-21 ENCOUNTER — Encounter: Payer: Self-pay | Admitting: Family Medicine

## 2015-08-21 ENCOUNTER — Other Ambulatory Visit: Payer: Self-pay | Admitting: Family Medicine

## 2015-08-22 ENCOUNTER — Other Ambulatory Visit: Payer: Self-pay | Admitting: Family Medicine

## 2015-08-24 ENCOUNTER — Telehealth: Payer: Self-pay

## 2015-08-24 MED ORDER — DULAGLUTIDE 0.75 MG/0.5ML ~~LOC~~ SOAJ: SUBCUTANEOUS | Status: DC

## 2015-08-24 NOTE — Telephone Encounter (Signed)
Patient request refill for Trulicity 0.75mg  . Patient advised that a follow up appointment is needed for further refills. Quay Simkin,CMA

## 2015-08-24 NOTE — Addendum Note (Signed)
Addended by: Doree Albee on: 08/24/2015 09:18 AM   Modules accepted: Orders

## 2015-08-26 ENCOUNTER — Encounter: Payer: Self-pay | Admitting: Family Medicine

## 2015-08-26 ENCOUNTER — Ambulatory Visit: Payer: BLUE CROSS/BLUE SHIELD

## 2015-08-26 ENCOUNTER — Ambulatory Visit (INDEPENDENT_AMBULATORY_CARE_PROVIDER_SITE_OTHER): Payer: BLUE CROSS/BLUE SHIELD | Admitting: Family Medicine

## 2015-08-26 VITALS — BP 124/83 | HR 86 | Wt 202.0 lb

## 2015-08-26 DIAGNOSIS — E11319 Type 2 diabetes mellitus with unspecified diabetic retinopathy without macular edema: Secondary | ICD-10-CM | POA: Diagnosis not present

## 2015-08-26 DIAGNOSIS — M25571 Pain in right ankle and joints of right foot: Secondary | ICD-10-CM

## 2015-08-26 DIAGNOSIS — M79604 Pain in right leg: Secondary | ICD-10-CM | POA: Diagnosis not present

## 2015-08-26 DIAGNOSIS — M25471 Effusion, right ankle: Secondary | ICD-10-CM

## 2015-08-26 DIAGNOSIS — E1122 Type 2 diabetes mellitus with diabetic chronic kidney disease: Secondary | ICD-10-CM

## 2015-08-26 DIAGNOSIS — I10 Essential (primary) hypertension: Secondary | ICD-10-CM

## 2015-08-26 DIAGNOSIS — I5042 Chronic combined systolic (congestive) and diastolic (congestive) heart failure: Secondary | ICD-10-CM

## 2015-08-26 LAB — POCT GLYCOSYLATED HEMOGLOBIN (HGB A1C): Hemoglobin A1C: 6.4

## 2015-08-26 MED ORDER — APIXABAN 5 MG PO TABS: ORAL_TABLET | ORAL | Status: DC

## 2015-08-26 NOTE — Progress Notes (Signed)
Subjective:    Patient ID: James Burke, male    DOB: 1966/05/10, 49 y.o.   MRN: [id number]  HPI Diabetes - no hypoglycemic events. No wounds or sores that are not healing well. No increased thirst or urination. Checking glucose at home. Taking medications as prescribed without any side effects.  He also complains about right ankle and foot swelling that's been going on for about a week. It's been getting worse over the last couple of days. He's been trying to keep it elevated and propped up at work. It's actually quite painful to walk on. Most of his pain goes near his ankle and also on the inner portion of the knee. And last night he said the injection felt not on the inner portion of the knee. It's extremely tender and sore. He does not remember any trauma or injury. No prior history of gout. No family history of gout. No fevers chills or sweats. His pain does improve if he keeps his foot more elevated which he's been trying to do. Staying upright for long period makes it worse. Walking on his also quite uncomfortable but he can do so.  His mother actually passed away on December 24, 2022.  Chronic heart failure, combined systolic and diastolic- no CP, SOB.  Has has swelling in the right LE but not in the left.    Hypertension- Pt denies chest pain, SOB, dizziness, or heart palpitations.  Taking meds as directed w/o problems.  Denies medication side effects.     Review of Systems  BP 124/83 mmHg  Pulse 86  Wt 202 lb (91.627 kg)  SpO2 99%    Allergies  Allergen Reactions  . Methocarbamol Diarrhea  . Prednisone     Other reaction(s): Other blindness  . Simvastatin Other (See Comments)    Joint aches.     Past Medical History  Diagnosis Date  . Diabetes mellitus   . Congestive heart failure ([place])   . C. difficile colitis 04/18/2008  . Renal insufficiency   . GERD (gastroesophageal reflux disease)   . Arthritis, septic, knee [facility])     Past Surgical History  Procedure Laterality  Date  . Appendectomy  1995  . Knee surgery      Social History   Social History  . Marital Status: Married    Spouse Name: N/A  . Number of Children: 3  . Years of Education: N/A   Occupational History  . Not on file.   Social History Main Topics  . Smoking status: Never Smoker   . Smokeless tobacco: Current User    Types: Chew  . Alcohol Use: No  . Drug Use: No  . Sexual Activity: Yes   Other Topics Concern  . Not on file   Social History Narrative    Family History  Problem Relation Age of Onset  . Heart disease      No family history  . Cancer Brother     Outpatient Encounter Prescriptions as of 08/26/2015  Medication Sig  . AMBULATORY NON FORMULARY MEDICATION Medication Name: glucometer, strips and lancet Dx 250.02 Test twice a day.  Marland Kitchen amLODipine (NORVASC) 5 MG tablet   . aspirin 81 MG tablet Take 81 mg by mouth daily.  Marland Kitchen atorvastatin (LIPITOR) 10 MG tablet TAKE 1 TABLET (10 MG TOTAL) BY MOUTH DAILY.  . carvedilol (COREG) 25 MG tablet TAKE 1 TABLET (25 MG TOTAL) BY MOUTH 2 (TWO) TIMES DAILY WITH A MEAL.  . Dulaglutide (TRULICITY) A999333 0000000 SOPN INJECT  0.75 MG INTO THE SKIN EVERY 7 (SEVEN) DAYS. APPOINTMENT NEEDED FOR FURTHER REFILLS  . Dulaglutide (TRULICITY) A999333 0000000 SOPN Inject into the skin every 7 days  . furosemide (LASIX) 40 MG tablet TAKE 1 TABLET (40 MG TOTAL) BY MOUTH DAILY.  Marland Kitchen glipiZIDE (GLUCOTROL) 5 MG tablet Take 1 tablet (5 mg total) by mouth 2 (two) times daily before a meal. Place on hold as just filled previous Rx.  Marland Kitchen oxyCODONE-acetaminophen (PERCOCET) 10-325 MG per tablet   . Potassium Chloride ER 20 MEQ TBCR Take 40 mg by mouth daily.  . tamsulosin (FLOMAX) 0.4 MG CAPS capsule TAKE 1 CAPSULE BY MOUTH DAILY AFTER SUPPER  . zolpidem (AMBIEN) 10 MG tablet Take 1 tablet (10 mg total) by mouth at bedtime as needed. for sleep Due for insomnia f/u in november  . apixaban (ELIQUIS) 5 MG TABS tablet Take 2 tabs po BID x 7 days and then one  tab po BID.   No facility-administered encounter medications on file as of 08/26/2015.          Objective:   Physical Exam  Constitutional: He is oriented to person, place, and time. He appears well-developed and well-nourished.  HENT:  Head: Normocephalic and atraumatic.  Cardiovascular: Normal rate, regular rhythm and normal heart sounds.   Pulmonary/Chest: Effort normal and breath sounds normal.  Musculoskeletal:  Right ankle and foot significantly swollen. Some mild pitting edema. His right calf is larger as well. It measured 30 8/2 cm. The left calf measured 34 cm. He has some tenderness along the inner part of the right knee not over the joint line.  Neurological: He is alert and oriented to person, place, and time.  Skin: Skin is warm and dry.  Psychiatric: He has a normal mood and affect. His behavior is normal.          Assessment & Plan:  DM- hemoglobin A1c of 6.4 today. This is well controlled but it is up compared to his previous. The he admits he's been getting into some more rich foods recently over the holidays. Continue to work on diet and increased activity level as he is able to. Follow-up in 3 months.  Right lower extremity swelling. He actually has a 67 m difference in calf size on the right compared to the left. I'm quite concerned about the possibility of DVT. Will send for Dopplers this morning urgently. If these are negative consider further workup and evaluation for other causes such as gout or infection.  Chronic heart failure, combined systolic and diastolic. Stable. No recent onset of symptoms. No shortness of breath or palpitations.  HTN - well controlled.  Continue current regimen.

## 2015-09-01 ENCOUNTER — Encounter: Payer: Self-pay | Admitting: Family Medicine

## 2015-09-01 ENCOUNTER — Ambulatory Visit (INDEPENDENT_AMBULATORY_CARE_PROVIDER_SITE_OTHER): Payer: BLUE CROSS/BLUE SHIELD | Admitting: Family Medicine

## 2015-09-01 VITALS — BP 107/61 | HR 94 | Wt 199.0 lb

## 2015-09-01 DIAGNOSIS — I82401 Acute embolism and thrombosis of unspecified deep veins of right lower extremity: Secondary | ICD-10-CM | POA: Diagnosis not present

## 2015-09-01 NOTE — Progress Notes (Signed)
   Subjective:    Patient ID: James Burke, male    DOB: 1966-02-27, 49 y.o.   MRN: [id number]  HPI Here today for follow-up of new onset DVT of the right lower extremity. He still having some pain and swelling. He has been trying to keep it elevated which does seem to help. His wife who is here with him today says she bought him some compression stockings but he really doesn't like them so he hasn't been wearing them. Did pick up the medication. He is on Eliquis 2 tabs twice a day. I believe tomorrow he switches to 1 tab twice a day. He has not had any problems or side effects with the medication. When I asked him about previous travel he did travel to [place] and back which was about a 3 Hour Dr. about 3 weeks ago which was about 1 week before his symptoms started. And the week before I saw him he had actually travel to [facility] back which is about 45 minutes and is atypical drop for him in a normal work week.   Review of Systems     Objective:   Physical Exam  Constitutional: He is oriented to person, place, and time. He appears well-developed and well-nourished.  HENT:  Head: Normocephalic and atraumatic.  Musculoskeletal: He exhibits edema.  He still has 1+ edema of the right foot and trace edema of the ankle and lower calf area. He still tender with gentle calf squeeze and tender along the inner knee on the right leg.  Neurological: He is alert and oriented to person, place, and time.  Skin: Skin is warm and dry.  Psychiatric: He has a normal mood and affect. His behavior is normal.          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 He is taking his medication appropriately. He has had some reduction in the swelling around the calf area but still very tender to touch. We did a light Ace wrap for compression today can certainly use this instead of a compression stocking. Encouraged him not to sit for long periods of time and to stay up and moving  around but no strenuous activity. Okay to return to work if he so desires. 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 I will see him back in 1 month. Continue to make take medication. We discussed that we may do a hypercoagulable panel after he comes off of his blood thinner since we don't necessarily have a specific cause for his DVT. Will treat for 3-6 months, as no prior history.

## 2015-09-06 DIAGNOSIS — Z9289 Personal history of other medical treatment: Secondary | ICD-10-CM

## 2015-09-06 HISTORY — DX: Personal history of other medical treatment: Z92.89

## 2015-09-09 ENCOUNTER — Ambulatory Visit: Payer: BLUE CROSS/BLUE SHIELD | Admitting: Family Medicine

## 2015-09-14 ENCOUNTER — Other Ambulatory Visit: Payer: Self-pay | Admitting: Family Medicine

## 2015-09-14 ENCOUNTER — Other Ambulatory Visit: Payer: Self-pay | Admitting: Cardiology

## 2015-09-17 ENCOUNTER — Other Ambulatory Visit: Payer: Self-pay | Admitting: Cardiology

## 2015-09-17 ENCOUNTER — Other Ambulatory Visit: Payer: Self-pay | Admitting: Family Medicine

## 2015-09-24 ENCOUNTER — Other Ambulatory Visit: Payer: Self-pay | Admitting: Family Medicine

## 2015-09-25 ENCOUNTER — Telehealth: Payer: Self-pay

## 2015-09-25 ENCOUNTER — Other Ambulatory Visit: Payer: Self-pay

## 2015-09-25 MED ORDER — DULAGLUTIDE 0.75 MG/0.5ML ~~LOC~~ SOAJ: SUBCUTANEOUS | Status: DC

## 2015-09-25 NOTE — Telephone Encounter (Signed)
Patient request refill for Trulicity. It was approved and sent to pharmacy. Rhonda Cunningham,CMA

## 2015-09-25 NOTE — Telephone Encounter (Signed)
Eec'd return call from pt.  Stated that Dr. Posey Pronto advised him to hold off on having the procedure for AVF at this time.  Stated his lab work has improved, and he has been placed on the transplant list.

## 2015-09-25 NOTE — Telephone Encounter (Signed)
Phone call to pt. To follow-up on scheduling procedure for Left Arm AVF Creation; left voice message to call to schedule an office appt., to get surgery scheduled.

## 2015-09-30 ENCOUNTER — Encounter: Payer: Self-pay | Admitting: Family Medicine

## 2015-09-30 ENCOUNTER — Ambulatory Visit (INDEPENDENT_AMBULATORY_CARE_PROVIDER_SITE_OTHER): Payer: BLUE CROSS/BLUE SHIELD | Admitting: Family Medicine

## 2015-09-30 VITALS — BP 130/81 | HR 85 | Wt 200.0 lb

## 2015-09-30 DIAGNOSIS — I82401 Acute embolism and thrombosis of unspecified deep veins of right lower extremity: Secondary | ICD-10-CM

## 2015-09-30 DIAGNOSIS — M7989 Other specified soft tissue disorders: Secondary | ICD-10-CM

## 2015-09-30 NOTE — Progress Notes (Signed)
   Subjective:    Patient ID: James Burke, male    DOB: Jul 31, 1966, 50 y.o.   MRN: [id number]  HPI Here today for follow-up DVT. He was diagnosed on December 21. He says his right lower leg and foot have been very swollen. He said it started getting a little bit better about a week and a half ago. Mostly in the ankle and lower leg but the foot is still very swollen especially on the bottom of his foot. He was worried that this may or may not be normal. Because of the abnormality has been using a walker to help him because walking on it is quite painful. He denies any trauma or injury to the foot. He still has a couple of tender areas on the right inner thigh that are still sensitive to touch. No fevers chills or sweats. She's been tolerating the medication without any bleeding or excess bruising. He is back at work but has been keeping the foot elevated and using an Ace wrap.  Review of Systems     Objective:   Physical Exam  Constitutional: He is oriented to person, place, and time. He appears well-developed and well-nourished.  HENT:  Head: Normocephalic and atraumatic.  Eyes: Conjunctivae and EOM are normal.  Cardiovascular: Normal rate.   Pulmonary/Chest: Effort normal.  Musculoskeletal: He exhibits edema.  No significant edema of the right foot especially under the arch. The induration is quite firm and hard. His ankle and the rest of the lower leg actually looked really good. No bruising or rash or breaks in the skin.  Neurological: He is alert and oriented to person, place, and time.  Skin: Skin is dry. No pallor.  Psychiatric: He has a normal mood and affect. His behavior is normal.  Vitals reviewed.         Assessment & Plan:  Right lower extremity DVT- is little unusual to be quite that swollen on the foot especially since the DVT was about 4 weeks ago. Since the swelling overall has started to improve in the last week and a half I encouraged him and give it another 1-2  weeks to see if it continues to improve. If the foot particularly the bottom of the foot continues to stay swollen and please give me a call next week or 2 and will consider further evaluation with ultrasound and/or possible x-ray just to rule out any other trauma or injury to the foot. Continue to keep the Ace wrap in place which has been helping and continue to keep elevated when he can.  Foot swelling-see note above.

## 2015-10-14 ENCOUNTER — Other Ambulatory Visit: Payer: Self-pay | Admitting: Family Medicine

## 2015-10-14 NOTE — Telephone Encounter (Deleted)
Pt was just seen 09/30/15 for DVT f/u but would like this refill and RX says needs an appointment. Please advise

## 2015-10-19 ENCOUNTER — Other Ambulatory Visit: Payer: Self-pay | Admitting: Family Medicine

## 2015-10-22 ENCOUNTER — Other Ambulatory Visit: Payer: Self-pay | Admitting: Family Medicine

## 2015-10-22 MED ORDER — DULAGLUTIDE 0.75 MG/0.5ML ~~LOC~~ SOAJ
SUBCUTANEOUS | Status: DC
Start: 2015-10-22 — End: 2015-10-23

## 2015-10-22 NOTE — Addendum Note (Signed)
Addended by: Huel Cote on: 10/22/2015 03:15 PM   Modules accepted: Orders

## 2015-10-23 ENCOUNTER — Other Ambulatory Visit: Payer: Self-pay | Admitting: Family Medicine

## 2015-10-23 ENCOUNTER — Encounter: Payer: Self-pay | Admitting: Family Medicine

## 2015-10-23 ENCOUNTER — Ambulatory Visit (INDEPENDENT_AMBULATORY_CARE_PROVIDER_SITE_OTHER): Payer: BLUE CROSS/BLUE SHIELD

## 2015-10-23 ENCOUNTER — Ambulatory Visit (INDEPENDENT_AMBULATORY_CARE_PROVIDER_SITE_OTHER): Payer: BLUE CROSS/BLUE SHIELD | Admitting: Family Medicine

## 2015-10-23 VITALS — BP 147/92 | HR 81 | Wt 200.0 lb

## 2015-10-23 DIAGNOSIS — M79671 Pain in right foot: Secondary | ICD-10-CM

## 2015-10-23 DIAGNOSIS — M25474 Effusion, right foot: Secondary | ICD-10-CM

## 2015-10-23 DIAGNOSIS — E11610 Type 2 diabetes mellitus with diabetic neuropathic arthropathy: Secondary | ICD-10-CM | POA: Diagnosis not present

## 2015-10-23 DIAGNOSIS — S93324A Dislocation of tarsometatarsal joint of right foot, initial encounter: Secondary | ICD-10-CM

## 2015-10-23 DIAGNOSIS — M216X1 Other acquired deformities of right foot: Secondary | ICD-10-CM | POA: Diagnosis not present

## 2015-10-23 DIAGNOSIS — R197 Diarrhea, unspecified: Secondary | ICD-10-CM | POA: Diagnosis not present

## 2015-10-23 DIAGNOSIS — I82401 Acute embolism and thrombosis of unspecified deep veins of right lower extremity: Secondary | ICD-10-CM

## 2015-10-23 DIAGNOSIS — Z1211 Encounter for screening for malignant neoplasm of colon: Secondary | ICD-10-CM

## 2015-10-23 MED ORDER — DULAGLUTIDE 0.75 MG/0.5ML ~~LOC~~ SOAJ: SUBCUTANEOUS | Status: DC

## 2015-10-23 NOTE — Progress Notes (Signed)
   Subjective:    I'm seeing this patient as a consultation for:  Dr Madilyn Fireman  CC: Right foot defomity  HPI: Patient has a two-month history of right foot and leg swelling. This was seen in December. He denies any injury history. On his initial visit in December the primary care provider noted leg swelling and obtained an ultrasound that was significant for DVT. He's been successfully treated for DVT however he notes continued foot swelling and deformity. He notes foot swelling worse after the end of the day and better with rest. This was brought to the PCPs attention today. Dr Madilyn Fireman obtained an x-ray that showed a significant Lisfranc deformity. The patient adamantly denies any injury history around the time of the foot swelling. He notes the deformity is present with swelling but does not hurt terribly badly. He has a pertinent medical history for previously poorly controlled diabetes. He denies any fevers or chills. He ambulates with a cane. He states he would have great difficulty with nonweightbearing status with his right leg because he has significant left knee pain.  Past medical history, Surgical history, Family history not pertinant except as noted below, Social history, Allergies, and medications have been entered into the medical record, reviewed, and no changes needed.   Review of Systems: No headache, visual changes, nausea, vomiting, diarrhea, constipation, dizziness, abdominal pain, skin rash, fevers, chills, night sweats, weight loss, swollen lymph nodes, body aches, joint swelling, muscle aches, chest pain, shortness of breath, mood changes, visual or auditory hallucinations.   Objective:   HT 81, BP 147/92, Sat 100%, Wt 200lbs General: Well Developed, well nourished, and in no acute distress.  Neuro/Psych: Alert and oriented x3, extra-ocular muscles intact, able to move all 4 extremities, sensation grossly intact. Skin: Warm and dry, no rashes noted.  Respiratory: Not using  accessory muscles, speaking in full sentences, trachea midline.  Cardiovascular: Pulses palpable, no extremity edema. Abdomen: Does not appear distended. MSK: Right foot deformity present with significant plantar deformity and midfoot subluxation.  Pulses, capillary refill are intact.   No results found for this or any previous visit (from the past 24 hour(s)). Dg Foot Complete Right  10/23/2015  CLINICAL DATA:  Right lower extremity DVT 08/26/2015 with persistent pain and swelling right foot laterally and plantar aspect. Diabetes EXAM: RIGHT FOOT COMPLETE - 3+ VIEW COMPARISON:  None. FINDINGS: Examination demonstrates complete dislocation across the tarsal metatarsal joints and cuboid metatarsal joints with dislocation/subluxation of the tarsal bones and midfoot/hindfoot region in a plantar direction. Navicular bone subluxed plantar and laterally on the medial cuneiform. Several small bony fragments adjacent the proximal aspect of the medial cuneiform bone. Subtle lucency along the medial aspect of the navicular bone which may be due to fracture. Small inferior calcaneal spur. Soft tissue swelling diffusely over the midfoot region. 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 Navicular bone subluxed plantar/ laterally on the medial cuneiform. Possible chip fractures adjacent the proximal aspect of the medial cuneiform. Possible fracture along the medial aspect of the navicular bone. Findings may be acute versus chronic neuropathic change related to diabetic neuropathy. Electronically Signed   By: Marin Olp M.D.   On: 10/23/2015 14:06    Impression and Recommendations:   This case required medical decision making of moderate complexity.

## 2015-10-23 NOTE — Assessment & Plan Note (Signed)
Charcot foot deformity.  Given chronicity of symptoms and lack of injury I doubt there is a Lesfrank Fracture.  Plan cam walker and limited weightbearing.  F/u with Dr. Doran Durand at [facility] next week.

## 2015-10-23 NOTE — Patient Instructions (Addendum)
Thank you for coming in today. Follow up with Dr. Doran Durand.  Return as needed.  Use the cam walker and compression stocking.

## 2015-10-23 NOTE — Progress Notes (Signed)
   Subjective:    Patient ID: James Burke, male    DOB: 1966-04-09, 50 y.o.   MRN: [id number]  HPI Off and on Diarrhea for 3 weeks. Says he is barely eating.  Eating mostly crackers. Stools are more soft and mushy.  No meds for it.  No blood in the stool.  No fevers chills or sweats. No excess mucus. He says initially the stools are watery and now they're more soft and mushy.  DVT-he is doing much better. The swelling in his leg and ankle has some is completely resolved. He's taking his Eliquis regularly. Is not had any palms or side effects.   His only concern is that his right foot is still swollen. He's been using an Ace wrap for compression. He has been using a cane because he says he tends to lose his balance. He says it swells more on the bottom of the foot as the day goes on. It has been swollen since December when I first saw him and seemed to be getting better initially.   Review of Systems     Objective:   Physical Exam  Constitutional: He is oriented to person, place, and time. He appears well-developed and well-nourished.  HENT:  Head: Normocephalic and atraumatic.  Cardiovascular: Normal rate, regular rhythm and normal heart sounds.   Pulmonary/Chest: Effort normal and breath sounds normal.  Musculoskeletal:  Right foot is extremely swollen and does appear to have a lump on the bottom of the foot. He is only mildly tender with palpation. Ankle and lower extremity are not swollen at all.  Neurological: He is alert and oriented to person, place, and time.  Skin: Skin is warm and dry.  Psychiatric: He has a normal mood and affect. His behavior is normal.          Assessment & Plan:  Diarrhea - persistent for at least 3 weeks but does seem to be getting slightly better. We discussed options of testing for C. difficile as well as getting stool cultures. He says he wants to give it one more week to see if it continues to improve. Recommend a trial of over-the-counter  Imodium. He can always start with half a tab and go to whole tab if needed. He says Imodium tends to be very constipating for him.  DVT - January Eliquis. New perception sent to the pharmacy 2 days ago.  Right foot pain and swelling. The man further workup. We'll start with x-ray today and consider vascular workup if x-ray is normal.  X-ray showed a Lisfranc deformity. I'm going to get him in with our sports medicine doctor this afternoon to have a posterior splint placed and we'll go ahead and refer him to orthopedics to see Dr. Wylene Simmer. He will need to be nonweightbearing.

## 2015-10-27 LAB — CBC AND DIFFERENTIAL
HCT: 30 % — AB (ref 41–53)
Hemoglobin: 10.1 g/dL — AB (ref 13.5–17.5)

## 2015-10-27 LAB — VITAMIN D 25 HYDROXY (VIT D DEFICIENCY, FRACTURES): Vit D, 25-Hydroxy: 19

## 2015-10-27 LAB — BASIC METABOLIC PANEL
BUN: 44 mg/dL — AB (ref 4–21)
Glucose: 173 mg/dL

## 2015-11-08 ENCOUNTER — Encounter: Payer: Self-pay | Admitting: Family Medicine

## 2015-11-10 ENCOUNTER — Other Ambulatory Visit: Payer: Self-pay | Admitting: Family Medicine

## 2015-11-12 ENCOUNTER — Other Ambulatory Visit: Payer: Self-pay | Admitting: Family Medicine

## 2015-11-13 ENCOUNTER — Other Ambulatory Visit: Payer: Self-pay | Admitting: Family Medicine

## 2015-11-22 ENCOUNTER — Encounter: Payer: Self-pay | Admitting: Family Medicine

## 2015-11-24 ENCOUNTER — Other Ambulatory Visit: Payer: Self-pay | Admitting: Family Medicine

## 2015-11-27 ENCOUNTER — Encounter: Payer: Self-pay | Admitting: Family Medicine

## 2015-12-07 ENCOUNTER — Other Ambulatory Visit: Payer: Self-pay | Admitting: Family Medicine

## 2015-12-16 ENCOUNTER — Other Ambulatory Visit: Payer: Self-pay | Admitting: Family Medicine

## 2015-12-17 ENCOUNTER — Other Ambulatory Visit: Payer: Self-pay | Admitting: Family Medicine

## 2015-12-17 ENCOUNTER — Other Ambulatory Visit: Payer: Self-pay | Admitting: *Deleted

## 2015-12-17 MED ORDER — ZOLPIDEM TARTRATE 10 MG PO TABS: 10.0000 mg | ORAL_TABLET | Freq: Every evening | ORAL | Status: DC | PRN

## 2016-01-12 ENCOUNTER — Other Ambulatory Visit: Payer: Self-pay | Admitting: Family Medicine

## 2016-01-12 ENCOUNTER — Other Ambulatory Visit: Payer: Self-pay | Admitting: *Deleted

## 2016-01-12 MED ORDER — ZOLPIDEM TARTRATE 10 MG PO TABS
10.0000 mg | ORAL_TABLET | Freq: Every evening | ORAL | Status: DC | PRN
Start: 2016-01-12 — End: 2016-02-12

## 2016-01-12 MED ORDER — GLIPIZIDE 5 MG PO TABS: 5.0000 mg | ORAL_TABLET | Freq: Every day | ORAL | Status: DC

## 2016-01-12 NOTE — Addendum Note (Signed)
Addended by: Marcial Pacas on: 01/12/2016 12:08 PM   Modules accepted: Orders

## 2016-01-12 NOTE — Telephone Encounter (Signed)
James Burke, Rx placed in in-box ready for pickup/faxing.

## 2016-01-12 NOTE — Telephone Encounter (Signed)
NCCSDatabase reviewed

## 2016-01-26 ENCOUNTER — Other Ambulatory Visit: Payer: Self-pay | Admitting: Family Medicine

## 2016-02-08 ENCOUNTER — Other Ambulatory Visit: Payer: Self-pay | Admitting: *Deleted

## 2016-02-08 MED ORDER — CARVEDILOL 25 MG PO TABS: ORAL_TABLET | ORAL | Status: DC

## 2016-02-12 ENCOUNTER — Other Ambulatory Visit: Payer: Self-pay | Admitting: *Deleted

## 2016-02-12 MED ORDER — ZOLPIDEM TARTRATE 10 MG PO TABS: 10.0000 mg | ORAL_TABLET | Freq: Every evening | ORAL | Status: DC | PRN

## 2016-02-14 ENCOUNTER — Other Ambulatory Visit: Payer: Self-pay | Admitting: Family Medicine

## 2016-03-18 ENCOUNTER — Other Ambulatory Visit: Payer: Self-pay | Admitting: Family Medicine

## 2016-03-21 ENCOUNTER — Telehealth: Payer: Self-pay | Admitting: Family Medicine

## 2016-03-21 NOTE — Telephone Encounter (Signed)
Called pt and advised him he needs a diabetes fu, he said he would call back and schedule apt. I let him know he will need to be seen for before any refills are made. Thanks

## 2016-04-11 ENCOUNTER — Other Ambulatory Visit: Payer: Self-pay | Admitting: *Deleted

## 2016-04-11 ENCOUNTER — Other Ambulatory Visit: Payer: Self-pay | Admitting: Family Medicine

## 2016-04-11 MED ORDER — ZOLPIDEM TARTRATE 10 MG PO TABS: 10.0000 mg | ORAL_TABLET | Freq: Every evening | ORAL | 1 refills | Status: DC | PRN

## 2016-04-12 MED ORDER — ZOLPIDEM TARTRATE 10 MG PO TABS: 10.0000 mg | ORAL_TABLET | Freq: Every evening | ORAL | 0 refills | Status: DC | PRN

## 2016-04-12 MED ORDER — GLIPIZIDE 5 MG PO TABS: 5.0000 mg | ORAL_TABLET | Freq: Two times a day (BID) | ORAL | 0 refills | Status: DC

## 2016-04-19 ENCOUNTER — Encounter: Payer: Self-pay | Admitting: Family Medicine

## 2016-04-19 ENCOUNTER — Ambulatory Visit (INDEPENDENT_AMBULATORY_CARE_PROVIDER_SITE_OTHER): Payer: BLUE CROSS/BLUE SHIELD | Admitting: Family Medicine

## 2016-04-19 VITALS — BP 132/84 | HR 80 | Resp 17 | Ht 72.0 in | Wt 186.0 lb

## 2016-04-19 DIAGNOSIS — I5042 Chronic combined systolic (congestive) and diastolic (congestive) heart failure: Secondary | ICD-10-CM | POA: Diagnosis not present

## 2016-04-19 DIAGNOSIS — I10 Essential (primary) hypertension: Secondary | ICD-10-CM

## 2016-04-19 DIAGNOSIS — E1122 Type 2 diabetes mellitus with diabetic chronic kidney disease: Secondary | ICD-10-CM | POA: Diagnosis not present

## 2016-04-19 DIAGNOSIS — I82401 Acute embolism and thrombosis of unspecified deep veins of right lower extremity: Secondary | ICD-10-CM

## 2016-04-19 DIAGNOSIS — N184 Chronic kidney disease, stage 4 (severe): Secondary | ICD-10-CM

## 2016-04-19 DIAGNOSIS — E11610 Type 2 diabetes mellitus with diabetic neuropathic arthropathy: Secondary | ICD-10-CM

## 2016-04-19 DIAGNOSIS — Z13220 Encounter for screening for lipoid disorders: Secondary | ICD-10-CM

## 2016-04-19 LAB — POCT GLYCOSYLATED HEMOGLOBIN (HGB A1C): Hemoglobin A1C: 6.3

## 2016-04-19 MED ORDER — GLIPIZIDE 5 MG PO TABS: 5.0000 mg | ORAL_TABLET | Freq: Two times a day (BID) | ORAL | 1 refills | Status: DC

## 2016-04-19 MED ORDER — ATORVASTATIN CALCIUM 10 MG PO TABS: 10.0000 mg | ORAL_TABLET | Freq: Every day | ORAL | 3 refills | Status: DC

## 2016-04-19 NOTE — Progress Notes (Signed)
Subjective:    CC: DM  HPI: Diabetes - no hypoglycemic events. No wounds or sores that are not healing well. No increased thirst or urination. Checking glucose at home. Taking medications as prescribed without any side effects.Currently on glipizide and Trulicity, atorvastatin and aspirin daily.  Insomnia - On Ambien 10 mg. Taken as needed at bedtime.  Hypertension- Pt denies chest pain, SOB, dizziness, or heart palpitations.  Taking meds as directed w/o problems.  Denies medication side effects.    Hx of right leg DVT.  Eliquis twice a day. He wants time to come off the medication.  He recently had a visit with his kidney doctor. He still 25% function but it is stable. His vitamin D levels had dropped so they have started him on vitamin D 2000 4000 international units daily and he has started that.  Charcot foot-he is now in a custom fitted cast. They're hoping after a year once it stabilizes they may be able to put him into a specially fitted shoe.  Past medical history, Surgical history, Family history not pertinant except as noted below, Social history, Allergies, and medications have been entered into the medical record, reviewed, and corrections made.   Review of Systems: No fevers, chills, night sweats, weight loss, chest pain, or shortness of breath.   Objective:    General: Well Developed, well nourished, and in no acute distress.  Neuro: Alert and oriented x3, extra-ocular muscles intact, sensation grossly intact.  HEENT: Normocephalic, atraumatic  Skin: Warm and dry, no rashes. Cardiac: Regular rate and rhythm, no murmurs rubs or gallops, no lower extremity edema.  Respiratory: Clear to auscultation bilaterally. Not using accessory muscles, speaking in full sentences.   Impression and Recommendations:   DM- Well controlled. 11 A1c of 6.3 today. Continue current regimen. Follow up in 3 mo. due for screening lipid panel. It looks like he has not been on his statin as it has  not been refilled in quite some time so we'll go ahead and refill that today as well. He says his statin was stopped so will remove from med list for now.    Insomnia-okay to refill zolpidem.  HTN -   Well-controlled. Continue current regimen.  History DVT-okay to stop Eloquis.  We'll do a hypercoagulable panel in about 2 weeks. Lab slip provided.  Charcot foot-being managed by orthopedics in [facility] Dr. Doran Durand  Reminded due for colonoscopy.

## 2016-04-21 ENCOUNTER — Other Ambulatory Visit: Payer: Self-pay | Admitting: Family Medicine

## 2016-05-12 ENCOUNTER — Encounter: Payer: Self-pay | Admitting: Family Medicine

## 2016-05-12 MED ORDER — CARVEDILOL 25 MG PO TABS: ORAL_TABLET | ORAL | 1 refills | Status: DC

## 2016-05-12 MED ORDER — ZOLPIDEM TARTRATE 10 MG PO TABS: 10.0000 mg | ORAL_TABLET | Freq: Every evening | ORAL | 0 refills | Status: DC | PRN

## 2016-05-12 MED ORDER — POTASSIUM CHLORIDE ER 20 MEQ PO TBCR: EXTENDED_RELEASE_TABLET | ORAL | 1 refills | Status: DC

## 2016-06-22 LAB — HM DIABETES EYE EXAM

## 2016-06-28 ENCOUNTER — Encounter: Payer: Self-pay | Admitting: Family Medicine

## 2016-07-20 ENCOUNTER — Encounter: Payer: Self-pay | Admitting: Family Medicine

## 2016-07-20 ENCOUNTER — Ambulatory Visit (INDEPENDENT_AMBULATORY_CARE_PROVIDER_SITE_OTHER): Payer: BLUE CROSS/BLUE SHIELD | Admitting: Family Medicine

## 2016-07-20 VITALS — BP 138/76 | HR 80

## 2016-07-20 DIAGNOSIS — I10 Essential (primary) hypertension: Secondary | ICD-10-CM

## 2016-07-20 DIAGNOSIS — Z23 Encounter for immunization: Secondary | ICD-10-CM | POA: Diagnosis not present

## 2016-07-20 DIAGNOSIS — N184 Chronic kidney disease, stage 4 (severe): Secondary | ICD-10-CM

## 2016-07-20 DIAGNOSIS — E1122 Type 2 diabetes mellitus with diabetic chronic kidney disease: Secondary | ICD-10-CM | POA: Diagnosis not present

## 2016-07-20 LAB — POCT GLYCOSYLATED HEMOGLOBIN (HGB A1C): Hemoglobin A1C: 5.9

## 2016-07-20 NOTE — Progress Notes (Signed)
Subjective:    CC: DM  HPI: Diabetes - no hypoglycemic events. No wounds or sores that are not healing well. No increased thirst or urination. Checking glucose at home. Taking medications as prescribed without any side effects.He did miss a week of the Trulicity.  CKD 4-he is calm appointment in January to discuss renal transplant. They want to know his blood type and have an up-to-date TB test. He thinks he had a blood typing done when he had a blood transfusion back in August 2015.  Charcot foot, right-he was unable to make his last appoint with orthopedist but has been wearing his custom boot consistently. He plans to call to reschedule.  Past medical history, Surgical history, Family history not pertinant except as noted below, Social history, Allergies, and medications have been entered into the medical record, reviewed, and corrections made.   Review of Systems: No fevers, chills, night sweats, weight loss, chest pain, or shortness of breath.   Objective:    General: Well Developed, well nourished, and in no acute distress.  Neuro: Alert and oriented x3, extra-ocular muscles intact, sensation grossly intact.  HEENT: Normocephalic, atraumatic  Skin: Warm and dry, no rashes. Cardiac: Regular rate and rhythm, no murmurs rubs or gallops, no lower extremity edema.  Respiratory: Clear to auscultation bilaterally. Not using accessory muscles, speaking in full sentences.   Impression and Recommendations:   DM - Well controlled. 11 A1c of 5.9. Doing absolutely fantastic. In fact want him to monitor for any lows with his blood sugars and call if he is having any problems.   HTN - Well controlled. Continue current regimen. Follow up in  3-4 months.   CKD 4-has an appointment in January discuss possible transplant. We were able to find his blood typing which is a positive and we printed this offer him. He also needs a recent copy of TB testing. It does look like it was performed at [facility]  but unfortunately I could not see the results of the test. So he may have to call them directly for those results.  Prevnar 13 given today to update his vaccinations. His flu shot is up-to-date as well.

## 2016-07-21 MED ORDER — GLIPIZIDE 5 MG PO TABS: 5.0000 mg | ORAL_TABLET | Freq: Two times a day (BID) | ORAL | 1 refills | Status: DC

## 2016-07-21 MED ORDER — FUROSEMIDE 40 MG PO TABS: ORAL_TABLET | ORAL | 1 refills | Status: DC

## 2016-08-08 ENCOUNTER — Encounter: Payer: Self-pay | Admitting: Family Medicine

## 2016-08-08 MED ORDER — TAMSULOSIN HCL 0.4 MG PO CAPS: ORAL_CAPSULE | ORAL | 2 refills | Status: DC

## 2016-08-10 ENCOUNTER — Other Ambulatory Visit: Payer: Self-pay

## 2016-08-10 MED ORDER — ZOLPIDEM TARTRATE 10 MG PO TABS: 10.0000 mg | ORAL_TABLET | Freq: Every evening | ORAL | 0 refills | Status: DC | PRN

## 2016-10-08 ENCOUNTER — Other Ambulatory Visit: Payer: Self-pay | Admitting: Family Medicine

## 2016-10-10 ENCOUNTER — Other Ambulatory Visit: Payer: Self-pay | Admitting: *Deleted

## 2016-10-10 MED ORDER — TAMSULOSIN HCL 0.4 MG PO CAPS: ORAL_CAPSULE | ORAL | 2 refills | Status: DC

## 2016-10-10 MED ORDER — ATORVASTATIN CALCIUM 10 MG PO TABS: 10.0000 mg | ORAL_TABLET | Freq: Every day | ORAL | 3 refills | Status: DC

## 2016-10-18 ENCOUNTER — Telehealth: Payer: Self-pay | Admitting: *Deleted

## 2016-10-18 NOTE — Telephone Encounter (Signed)
Patient called and wanted to know what his blood type was. I looked in care everywhere and let the patient know his blood type is O pos( see blood bank test 04/17/2014)

## 2016-10-19 ENCOUNTER — Ambulatory Visit: Payer: BLUE CROSS/BLUE SHIELD | Admitting: Family Medicine

## 2016-10-26 ENCOUNTER — Encounter: Payer: Self-pay | Admitting: Family Medicine

## 2016-10-26 ENCOUNTER — Ambulatory Visit (INDEPENDENT_AMBULATORY_CARE_PROVIDER_SITE_OTHER): Payer: BLUE CROSS/BLUE SHIELD | Admitting: Family Medicine

## 2016-10-26 VITALS — BP 134/84 | HR 78 | Ht 72.0 in | Wt 190.0 lb

## 2016-10-26 DIAGNOSIS — E11319 Type 2 diabetes mellitus with unspecified diabetic retinopathy without macular edema: Secondary | ICD-10-CM

## 2016-10-26 DIAGNOSIS — E1122 Type 2 diabetes mellitus with diabetic chronic kidney disease: Secondary | ICD-10-CM | POA: Diagnosis not present

## 2016-10-26 DIAGNOSIS — Z23 Encounter for immunization: Secondary | ICD-10-CM

## 2016-10-26 DIAGNOSIS — I10 Essential (primary) hypertension: Secondary | ICD-10-CM | POA: Diagnosis not present

## 2016-10-26 DIAGNOSIS — I420 Dilated cardiomyopathy: Secondary | ICD-10-CM

## 2016-10-26 LAB — POCT GLYCOSYLATED HEMOGLOBIN (HGB A1C): Hemoglobin A1C: 5.2

## 2016-10-26 NOTE — Progress Notes (Signed)
Subjective:    CC: DM  HPI: Diabetes - no hypoglycemic events. No wounds or sores that are not healing well. No increased thirst or urination. Checking glucose at home. Taking medications as prescribed without any side effects.  Hypertension- Pt denies chest pain, SOB, dizziness, or heart palpitations.  Taking meds as directed w/o problems.  Denies medication side effects.    Diabetic retinopathy-X she just had surgery on his right eye and will soon be having surgery on his left eye probably over the summer.  Charcot foot-right foot infected. He is in a boot that he will be in for approximately a year and at that point hopefully we'll be able to wean off.  He went to his first class last weekend to discuss kidney transplant. He said he was given a list of things that he needs to have before transplant including immunizations. He will try to get [place] a copy of that listed take a look at.  Past medical history, Surgical history, Family history not pertinant except as noted below, Social history, Allergies, and medications have been entered into the medical record, reviewed, and corrections made.   Review of Systems: No fevers, chills, night sweats, weight loss, chest pain, or shortness of breath.   Objective:    General: Well Developed, well nourished, and in no acute distress.  Neuro: Alert and oriented x3, extra-ocular muscles intact, sensation grossly intact.  HEENT: Normocephalic, atraumatic  Skin: Warm and dry, no rashes. Cardiac: Regular rate and rhythm, no murmurs rubs or gallops, no lower extremity edema.  Respiratory: Clear to auscultation bilaterally. Not using accessory muscles, speaking in full sentences.   Impression and Recommendations:     DM -  With neurologic complications  - controlled. Hemoccult A1c down to 5.2 which is absolutely phenomenal. He says he is not having any hypoglycemic events but will keep an eye on this. If he starts having these then we'll need to  decrease his glipizide down. Follow-up in 3 months. Due for lipids.   Diabetic retinopathy-following regularly with his eye doctor.  Charcot foot -continue to follow with orthopedics.  HTN - Well controlled. Continue current regimen. Follow up in  3-4 months.

## 2016-10-26 NOTE — Addendum Note (Signed)
Addended by: Teddy Spike on: 10/26/2016 10:38 AM   Modules accepted: Orders

## 2016-11-01 ENCOUNTER — Encounter: Payer: Self-pay | Admitting: Family Medicine

## 2016-11-02 ENCOUNTER — Other Ambulatory Visit: Payer: Self-pay | Admitting: Family Medicine

## 2016-11-10 ENCOUNTER — Other Ambulatory Visit: Payer: Self-pay | Admitting: Family Medicine

## 2016-11-22 ENCOUNTER — Other Ambulatory Visit: Payer: Self-pay | Admitting: Family Medicine

## 2016-12-15 ENCOUNTER — Encounter: Payer: Self-pay | Admitting: Family Medicine

## 2016-12-16 MED ORDER — FUROSEMIDE 40 MG PO TABS: ORAL_TABLET | ORAL | 1 refills | Status: DC

## 2016-12-29 ENCOUNTER — Other Ambulatory Visit: Payer: Self-pay

## 2016-12-29 ENCOUNTER — Inpatient Hospital Stay (HOSPITAL_COMMUNITY)
Admission: AD | Admit: 2016-12-29 | Discharge: 2017-01-07 | DRG: 252 | Disposition: A | Discharge status: Home / Self Care | Payer: BLUE CROSS/BLUE SHIELD | Source: Other Acute Inpatient Hospital | Attending: Internal Medicine | Admitting: Internal Medicine

## 2016-12-29 ENCOUNTER — Emergency Department
Admission: EM | Admit: 2016-12-29 | Discharge: 2016-12-29 | Disposition: A | Discharge status: Home / Self Care | Payer: BLUE CROSS/BLUE SHIELD | Source: Home / Self Care | Attending: Family Medicine | Admitting: Family Medicine

## 2016-12-29 ENCOUNTER — Encounter: Payer: Self-pay | Admitting: Emergency Medicine

## 2016-12-29 DIAGNOSIS — R079 Chest pain, unspecified: Secondary | ICD-10-CM | POA: Diagnosis present

## 2016-12-29 DIAGNOSIS — Z8679 Personal history of other diseases of the circulatory system: Secondary | ICD-10-CM

## 2016-12-29 DIAGNOSIS — E1165 Type 2 diabetes mellitus with hyperglycemia: Secondary | ICD-10-CM | POA: Diagnosis present

## 2016-12-29 DIAGNOSIS — I130 Hypertensive heart and chronic kidney disease with heart failure and stage 1 through stage 4 chronic kidney disease, or unspecified chronic kidney disease: Secondary | ICD-10-CM

## 2016-12-29 DIAGNOSIS — N179 Acute kidney failure, unspecified: Secondary | ICD-10-CM

## 2016-12-29 DIAGNOSIS — I319 Disease of pericardium, unspecified: Secondary | ICD-10-CM | POA: Diagnosis not present

## 2016-12-29 DIAGNOSIS — N400 Enlarged prostate without lower urinary tract symptoms: Secondary | ICD-10-CM | POA: Diagnosis present

## 2016-12-29 DIAGNOSIS — I272 Pulmonary hypertension, unspecified: Secondary | ICD-10-CM | POA: Diagnosis present

## 2016-12-29 DIAGNOSIS — N184 Chronic kidney disease, stage 4 (severe): Secondary | ICD-10-CM | POA: Diagnosis not present

## 2016-12-29 DIAGNOSIS — I313 Pericardial effusion (noninflammatory): Secondary | ICD-10-CM | POA: Diagnosis present

## 2016-12-29 DIAGNOSIS — I5043 Acute on chronic combined systolic (congestive) and diastolic (congestive) heart failure: Secondary | ICD-10-CM | POA: Diagnosis not present

## 2016-12-29 DIAGNOSIS — R0602 Shortness of breath: Secondary | ICD-10-CM | POA: Diagnosis not present

## 2016-12-29 DIAGNOSIS — F17220 Nicotine dependence, chewing tobacco, uncomplicated: Secondary | ICD-10-CM | POA: Diagnosis present

## 2016-12-29 DIAGNOSIS — Z809 Family history of malignant neoplasm, unspecified: Secondary | ICD-10-CM

## 2016-12-29 DIAGNOSIS — D72829 Elevated white blood cell count, unspecified: Secondary | ICD-10-CM | POA: Diagnosis present

## 2016-12-29 DIAGNOSIS — Z7984 Long term (current) use of oral hypoglycemic drugs: Secondary | ICD-10-CM | POA: Diagnosis not present

## 2016-12-29 DIAGNOSIS — E876 Hypokalemia: Secondary | ICD-10-CM | POA: Diagnosis not present

## 2016-12-29 DIAGNOSIS — K219 Gastro-esophageal reflux disease without esophagitis: Secondary | ICD-10-CM | POA: Diagnosis present

## 2016-12-29 DIAGNOSIS — I428 Other cardiomyopathies: Secondary | ICD-10-CM | POA: Diagnosis present

## 2016-12-29 DIAGNOSIS — M1710 Unilateral primary osteoarthritis, unspecified knee: Secondary | ICD-10-CM | POA: Diagnosis present

## 2016-12-29 DIAGNOSIS — N189 Chronic kidney disease, unspecified: Secondary | ICD-10-CM | POA: Diagnosis present

## 2016-12-29 DIAGNOSIS — E11610 Type 2 diabetes mellitus with diabetic neuropathic arthropathy: Secondary | ICD-10-CM | POA: Diagnosis present

## 2016-12-29 DIAGNOSIS — I10 Essential (primary) hypertension: Secondary | ICD-10-CM | POA: Diagnosis present

## 2016-12-29 DIAGNOSIS — Z888 Allergy status to other drugs, medicaments and biological substances status: Secondary | ICD-10-CM

## 2016-12-29 DIAGNOSIS — E1122 Type 2 diabetes mellitus with diabetic chronic kidney disease: Secondary | ICD-10-CM | POA: Diagnosis present

## 2016-12-29 DIAGNOSIS — Z992 Dependence on renal dialysis: Secondary | ICD-10-CM

## 2016-12-29 DIAGNOSIS — M14679 Charcot's joint, unspecified ankle and foot: Secondary | ICD-10-CM | POA: Diagnosis present

## 2016-12-29 DIAGNOSIS — Z7982 Long term (current) use of aspirin: Secondary | ICD-10-CM

## 2016-12-29 DIAGNOSIS — E785 Hyperlipidemia, unspecified: Secondary | ICD-10-CM | POA: Diagnosis present

## 2016-12-29 DIAGNOSIS — R071 Chest pain on breathing: Secondary | ICD-10-CM

## 2016-12-29 DIAGNOSIS — N186 End stage renal disease: Secondary | ICD-10-CM

## 2016-12-29 DIAGNOSIS — I132 Hypertensive heart and chronic kidney disease with heart failure and with stage 5 chronic kidney disease, or end stage renal disease: Secondary | ICD-10-CM | POA: Diagnosis present

## 2016-12-29 DIAGNOSIS — I501 Left ventricular failure: Secondary | ICD-10-CM | POA: Diagnosis not present

## 2016-12-29 DIAGNOSIS — Z419 Encounter for procedure for purposes other than remedying health state, unspecified: Secondary | ICD-10-CM

## 2016-12-29 DIAGNOSIS — J918 Pleural effusion in other conditions classified elsewhere: Secondary | ICD-10-CM | POA: Diagnosis not present

## 2016-12-29 DIAGNOSIS — I3139 Other pericardial effusion (noninflammatory): Secondary | ICD-10-CM | POA: Diagnosis present

## 2016-12-29 DIAGNOSIS — D631 Anemia in chronic kidney disease: Secondary | ICD-10-CM | POA: Diagnosis present

## 2016-12-29 MED ORDER — ASPIRIN 325 MG PO TABS
325.0000 mg | ORAL_TABLET | Freq: Once | ORAL | Status: AC
  Administered 2016-12-29: 325 mg via ORAL

## 2016-12-29 NOTE — Progress Notes (Addendum)
Transfer from [facility].  Mr. 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 Reports chest pain with deep inspiration. Followed by Dr. Posey Pronto of nephrology who was in the process of setting the patient up for need of dialysis.  Patient's previous baseline creatinine had been 3.56 back in 11/2015. Review of records also shows the patient was being evaluated for kidney transplant at [facility].  VS: Reported to be stable with patient's O2 saturations maintained on room air  Labs: WBC 6.6, hemoglobin 11, platelets 211, sodium 140, potassium 4.1, chloride 101, CO2 25, BUN 66, creatinine 5.7, glucose 111, proBNP 59,000,  and troponin T 0.087. CT scan of the chest revealed large pericardial effusion with bilateral pleural effusions.   Patient was given 60 mg of Lasix IV as he still makes urine. Dr. Mercy Moore of nephrology was notified regarding transfer patient and it was noted that he will see the patient likely a.m. May need to call nephrology in a.m. if not been seen. Transferring to a telemetry bed. Will need trending cardiac enzymes, echocardiogram, and cardiology consult as well. ? Possibility of PE as well.

## 2016-12-29 NOTE — ED Triage Notes (Signed)
Has had intermittent shortness of breath over past 10 days; this has gotten worse over past 2 days and it now occasionally hurts deep in chest when he inhales, and sometimes even with yawning.

## 2016-12-29 NOTE — ED Provider Notes (Signed)
CSN: [id number]     Arrival date & time 12/29/16  1554 History   First MD Initiated Contact with Patient 12/29/16 1619     Chief Complaint  Patient presents with  . Shortness of Breath  . Chest Pain   (Consider location/radiation/quality/duration/timing/severity/associated sxs/prior Treatment) HPI  James Burke is a 51 y.o. male presenting to UC with c/o chest soreness/discomfort and SOB that started about 2 days ago.  He initially started to feel bad about 1 week ago though.  Chest pain is worse with deep breathing and SOB is worse when lying back.  He has an eye problem that requires him to sleep in a recliner but even that worsens his SOB.  Hx of CHF about 10 years ago.  He feels like he has to keep yawning to get air in.  Symptoms feel similar to last time he had CHF.   Denies recent illness of congestion, fever, or chills. He has a mild intermittent dry cough.  No recent change in his medications.   Pt does have hx of DVT over 1 year ago. He was on blood thinners for about 6 months but is no longer on blood thinners.  He does have a boot on his Right leg due to a Charcot foot.  He has been in the boot since last year. His Right lower leg swells intermittently but no recent swelling of Left lower leg.  He does see a cardiologist annually. Last time was about 7 months ago.  Hx of renal insufficiency.   Past Medical History:  Diagnosis Date  . 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 C. difficile colitis 04/18/2008  . Congestive heart failure ([place])   . Diabetes mellitus   . DVT (deep venous thrombosis) ([facility])    right leg   . GERD (gastroesophageal reflux disease)   . Renal insufficiency    Past Surgical History:  Procedure Laterality Date  . APPENDECTOMY  1995  . KNEE SURGERY     Family History  Problem Relation Age of Onset  . Heart disease      No family history  . Cancer Brother    Social History  Substance Use Topics  . Smoking status: Never Smoker  . Smokeless tobacco:  Current User    Types: Chew  . Alcohol use No    Review of Systems  Constitutional: Negative for chills and fever.  HENT: Negative for congestion, ear pain, sore throat, trouble swallowing and voice change.   Respiratory: Positive for cough, chest tightness and shortness of breath.   Cardiovascular: Positive for chest pain and leg swelling (Right, intermittent, chronic). Negative for palpitations.  Gastrointestinal: Negative for abdominal pain, diarrhea, nausea and vomiting.  Musculoskeletal: Negative for arthralgias, back pain and myalgias.  Skin: Negative for rash.    Allergies  Methocarbamol; Prednisone; and Simvastatin  Home Medications   Prior to Admission medications   Medication Sig Start Date End Date Taking? Authorizing Provider  AMBULATORY NON FORMULARY MEDICATION Medication Name: glucometer, strips and lancet Dx 250.02 Test twice a day. 01/03/12   [name], MD  amLODipine (NORVASC) 5 MG tablet  04/01/15   Historical Provider, MD  aspirin 81 MG tablet Take 81 mg by mouth daily.    Historical Provider, MD  atorvastatin (LIPITOR) 10 MG tablet Take 1 tablet (10 mg total) by mouth at bedtime. 10/10/16   [name], MD  carvedilol (COREG) 25 MG tablet TAKE 1 TABLET (25 MG TOTAL) BY MOUTH 2 (TWO) TIMES  DAILY WITH A MEAL. 11/02/16   [name], MD  Cholecalciferol (VITAMIN D) 2000 units CAPS Take 1-2 capsules by mouth daily.    Historical Provider, MD  furosemide (LASIX) 40 MG tablet TAKE 1 TABLET (40 MG TOTAL) BY MOUTH DAILY. 12/16/16   [name], MD  glipiZIDE (GLUCOTROL) 5 MG tablet Take 1 tablet (5 mg total) by mouth 2 (two) times daily before a meal. 07/21/16   [name], MD  KLOR-CON M20 20 MEQ tablet TAKE 2 TABLET(S) BY MOUTH DAILY 10/10/16   [name], MD  oxyCODONE-acetaminophen (PERCOCET) 10-325 MG per tablet  09/24/14   Historical Provider, MD  tamsulosin (FLOMAX) 0.4 MG CAPS capsule TAKE 2 CAPSULES BY MOUTH DAILY  AFTER SUPPER 10/10/16   [name], MD  TRULICITY 5.36 RW/4.3XV SOPN INJECT 0.75MG  EVERY 7 DAYS 11/22/16   [name], MD  zolpidem (AMBIEN) 10 MG tablet TAKE 1 TABLET AT BEDTIME AS NEEDED 11/10/16   [name], MD   Meds Ordered and Administered this Visit   Medications  aspirin tablet 325 mg (not administered)    BP (!) 160/104 (BP Location: Right Arm)   Pulse 84   Temp 97.4 F (36.3 C) (Oral)   Resp 18   Ht 6' (1.829 m)   Wt 189 lb (85.7 kg)   SpO2 97%   BMI 25.63 kg/m  No data found.   Physical Exam  Constitutional: He is oriented to person, place, and time. He appears well-developed and well-nourished. No distress.  Pt sitting in exam chair. NAD. Appears mildly fatigued but is alert and cooperative.   HENT:  Head: Normocephalic and atraumatic.  Mouth/Throat: Oropharynx is clear and moist.  Eyes: EOM are normal.  Neck: Normal range of motion. Neck supple.  Cardiovascular: Normal rate and regular rhythm.   Pulmonary/Chest: Effort normal. No stridor. No respiratory distress. He has decreased breath sounds in the right lower field. He has no wheezes. He has no rhonchi. He has no rales.  Musculoskeletal: Normal range of motion. He exhibits no edema or tenderness.  Boot on Right lower leg.  Lymphadenopathy:    He has no cervical adenopathy.  Neurological: He is alert and oriented to person, place, and time.  Skin: Skin is warm and dry. He is not diaphoretic.  Psychiatric: He has a normal mood and affect. His behavior is normal.  Nursing note and vitals reviewed.   Urgent Care Course     Procedures (including critical care time)  Labs Review Labs Reviewed - No data to display  Imaging Review No results found.  Date/Time:  12/29/2016    16:39:56 Ventricular Rate: 75 PR Interval: 174 QRS Duration: 118 QT Interval: 418 QTC Calculation: 466 P-R-T axes: 29  -25  177 Text Interpretation: Normal sinus rhythm, Left ventricular  hypertrophy with QRS widening and repolarization abnormality. Cannot rule out septal infarct, age undetermined. Abnormal EKG.   Reviewed EKG with Dr. Madilyn Fireman, no significant change from EKG in 2015.   MDM   1. Shortness of breath   2. Chest pain on breathing   3. History of CHF (congestive heart failure)    Pt c/o SOB and chest discomfort for at least 2 days, with vague c/o "not feeling well" for about 1 week.  Consulted with pt's PCP, Dr. Madilyn Fireman.  Agree pt should be seen in emergency department for further evaluation due to hx of multiple comorbidities including hx of CHF, renal insufficiency, and DVT in the past.  Vitals: WNL, O2 Sat 97% HR- 84 Pt declined EMS transport. I feel pt is safe to drive POV to emergency department. Pt wants to go to [facility]. Aspirin 325mg  PO given prior to pt discharge.     Noland Fordyce, PA-C 12/29/16 1713

## 2016-12-30 ENCOUNTER — Ambulatory Visit: Payer: BLUE CROSS/BLUE SHIELD | Admitting: Family Medicine

## 2016-12-30 ENCOUNTER — Inpatient Hospital Stay (HOSPITAL_COMMUNITY): Payer: BLUE CROSS/BLUE SHIELD

## 2016-12-30 ENCOUNTER — Other Ambulatory Visit: Payer: Self-pay

## 2016-12-30 ENCOUNTER — Encounter (HOSPITAL_COMMUNITY): Payer: Self-pay | Admitting: *Deleted

## 2016-12-30 DIAGNOSIS — I10 Essential (primary) hypertension: Secondary | ICD-10-CM

## 2016-12-30 DIAGNOSIS — N184 Chronic kidney disease, stage 4 (severe): Secondary | ICD-10-CM

## 2016-12-30 DIAGNOSIS — N179 Acute kidney failure, unspecified: Secondary | ICD-10-CM

## 2016-12-30 DIAGNOSIS — N189 Chronic kidney disease, unspecified: Secondary | ICD-10-CM | POA: Diagnosis present

## 2016-12-30 DIAGNOSIS — I313 Pericardial effusion (noninflammatory): Secondary | ICD-10-CM

## 2016-12-30 DIAGNOSIS — N400 Enlarged prostate without lower urinary tract symptoms: Secondary | ICD-10-CM | POA: Diagnosis present

## 2016-12-30 DIAGNOSIS — I5043 Acute on chronic combined systolic (congestive) and diastolic (congestive) heart failure: Secondary | ICD-10-CM | POA: Diagnosis present

## 2016-12-30 LAB — GLUCOSE, CAPILLARY
Glucose-Capillary: 123 mg/dL — ABNORMAL HIGH (ref 65–99)
Glucose-Capillary: 135 mg/dL — ABNORMAL HIGH (ref 65–99)
Glucose-Capillary: 170 mg/dL — ABNORMAL HIGH (ref 65–99)
Glucose-Capillary: 90 mg/dL (ref 65–99)
Glucose-Capillary: 95 mg/dL (ref 65–99)

## 2016-12-30 LAB — T4, FREE: Free T4: 1.2 ng/dL — ABNORMAL HIGH (ref 0.61–1.12)

## 2016-12-30 LAB — CREATININE, URINE, RANDOM: Creatinine, Urine: 49.28 mg/dL

## 2016-12-30 LAB — HEPATIC FUNCTION PANEL
ALT: 14 U/L — ABNORMAL LOW (ref 17–63)
AST: 13 U/L — ABNORMAL LOW (ref 15–41)
Albumin: 3.4 g/dL — ABNORMAL LOW (ref 3.5–5.0)
Alkaline Phosphatase: 47 U/L (ref 38–126)
Bilirubin, Direct: 0.1 mg/dL (ref 0.1–0.5)
Indirect Bilirubin: 0.4 mg/dL (ref 0.3–0.9)
Total Bilirubin: 0.5 mg/dL (ref 0.3–1.2)
Total Protein: 6.3 g/dL — ABNORMAL LOW (ref 6.5–8.1)

## 2016-12-30 LAB — TROPONIN I
Troponin I: 0.03 ng/mL (ref <0.03)
Troponin I: 0.04 ng/mL (ref <0.03)
Troponin I: 0.03 ng/mL (ref <0.03)

## 2016-12-30 LAB — PROTEIN / CREATININE RATIO, URINE
Creatinine, Urine: 49.23 mg/dL
Protein Creatinine Ratio: 8.96 mg/mg{Cre} — ABNORMAL HIGH (ref 0.00–0.15)
Total Protein, Urine: 441 mg/dL

## 2016-12-30 LAB — CBC WITH DIFFERENTIAL/PLATELET
Basophils Absolute: 0 10*3/uL (ref 0.0–0.1)
Basophils Relative: 1 %
Eosinophils Absolute: 0.2 10*3/uL (ref 0.0–0.7)
Eosinophils Relative: 3 %
HCT: 32.1 % — ABNORMAL LOW (ref 39.0–52.0)
Hemoglobin: 10.9 g/dL — ABNORMAL LOW (ref 13.0–17.0)
Lymphocytes Relative: 14 %
Lymphs Abs: 0.9 10*3/uL (ref 0.7–4.0)
MCH: 27.6 pg (ref 26.0–34.0)
MCHC: 34 g/dL (ref 30.0–36.0)
MCV: 81.3 fL (ref 78.0–100.0)
Monocytes Absolute: 0.3 10*3/uL (ref 0.1–1.0)
Monocytes Relative: 5 %
Neutro Abs: 5 10*3/uL (ref 1.7–7.7)
Neutrophils Relative %: 77 %
Platelets: 200 10*3/uL (ref 150–400)
RBC: 3.95 MIL/uL — ABNORMAL LOW (ref 4.22–5.81)
RDW: 14.1 % (ref 11.5–15.5)
WBC: 6.5 10*3/uL (ref 4.0–10.5)

## 2016-12-30 LAB — URINALYSIS, ROUTINE W REFLEX MICROSCOPIC
Bacteria, UA: NONE SEEN
Bilirubin Urine: NEGATIVE
Glucose, UA: 50 mg/dL — AB
Ketones, ur: NEGATIVE mg/dL
Leukocytes, UA: NEGATIVE
Nitrite: NEGATIVE
Protein, ur: 100 mg/dL — AB
Specific Gravity, Urine: 1.01 (ref 1.005–1.030)
Squamous Epithelial / LPF: NONE SEEN
pH: 5 (ref 5.0–8.0)

## 2016-12-30 LAB — MAGNESIUM
Magnesium: 2.1 mg/dL (ref 1.7–2.4)
Magnesium: 2.2 mg/dL (ref 1.7–2.4)

## 2016-12-30 LAB — BASIC METABOLIC PANEL
Anion gap: 11 (ref 5–15)
BUN: 66 mg/dL — ABNORMAL HIGH (ref 6–20)
CO2: 24 mmol/L (ref 22–32)
Calcium: 8.9 mg/dL (ref 8.9–10.3)
Chloride: 103 mmol/L (ref 101–111)
Creatinine, Ser: 6.05 mg/dL — ABNORMAL HIGH (ref 0.61–1.24)
GFR calc Af Amer: 11 mL/min — ABNORMAL LOW (ref >60)
GFR calc non Af Amer: 10 mL/min — ABNORMAL LOW (ref >60)
Glucose, Bld: 85 mg/dL (ref 65–99)
Potassium: 3.6 mmol/L (ref 3.5–5.1)
Sodium: 138 mmol/L (ref 135–145)

## 2016-12-30 LAB — LIPID PANEL
Cholesterol: 107 mg/dL (ref 0–200)
HDL: 24 mg/dL — ABNORMAL LOW (ref >40)
LDL Cholesterol: 63 mg/dL (ref 0–99)
Total CHOL/HDL Ratio: 4.5 RATIO
Triglycerides: 100 mg/dL (ref <150)
VLDL: 20 mg/dL (ref 0–40)

## 2016-12-30 LAB — PHOSPHORUS: Phosphorus: 3.7 mg/dL (ref 2.5–4.6)

## 2016-12-30 LAB — HIV ANTIBODY (ROUTINE TESTING W REFLEX): HIV Screen 4th Generation wRfx: NONREACTIVE

## 2016-12-30 LAB — TSH: TSH: 2.218 u[IU]/mL (ref 0.350–4.500)

## 2016-12-30 LAB — BRAIN NATRIURETIC PEPTIDE: B Natriuretic Peptide: 2695.8 pg/mL — ABNORMAL HIGH (ref 0.0–100.0)

## 2016-12-30 MED ORDER — AMLODIPINE BESYLATE 5 MG PO TABS
5.0000 mg | ORAL_TABLET | Freq: Every day | ORAL | Status: DC
  Administered 2016-12-30: 5 mg via ORAL
  Filled 2016-12-30 (×2): qty 1

## 2016-12-30 MED ORDER — INSULIN ASPART 100 UNIT/ML ~~LOC~~ SOLN: 0.0000 [IU] | Freq: Every day | SUBCUTANEOUS | Status: DC

## 2016-12-30 MED ORDER — SODIUM CHLORIDE 0.9% FLUSH: 3.0000 mL | INTRAVENOUS | Status: DC | PRN

## 2016-12-30 MED ORDER — HEPARIN SODIUM (PORCINE) 5000 UNIT/ML IJ SOLN: 5000.0000 [IU] | Freq: Three times a day (TID) | INTRAMUSCULAR | Status: DC

## 2016-12-30 MED ORDER — CARVEDILOL 25 MG PO TABS
25.0000 mg | ORAL_TABLET | Freq: Two times a day (BID) | ORAL | Status: DC
  Administered 2016-12-30 – 2017-01-07 (×17): 25 mg via ORAL
  Filled 2016-12-30 (×18): qty 1

## 2016-12-30 MED ORDER — INSULIN ASPART 100 UNIT/ML ~~LOC~~ SOLN
0.0000 [IU] | Freq: Three times a day (TID) | SUBCUTANEOUS | Status: DC
  Administered 2016-12-30: 1 [IU] via SUBCUTANEOUS

## 2016-12-30 MED ORDER — ONDANSETRON HCL 4 MG/2ML IJ SOLN: 4.0000 mg | Freq: Four times a day (QID) | INTRAMUSCULAR | Status: DC | PRN

## 2016-12-30 MED ORDER — OXYCODONE-ACETAMINOPHEN 5-325 MG PO TABS
1.0000 | ORAL_TABLET | ORAL | Status: DC | PRN
  Administered 2016-12-30 – 2017-01-07 (×31): 1 via ORAL
  Filled 2016-12-30 (×29): qty 1

## 2016-12-30 MED ORDER — ZOLPIDEM TARTRATE 5 MG PO TABS
10.0000 mg | ORAL_TABLET | Freq: Every evening | ORAL | Status: DC | PRN
  Administered 2016-12-30 – 2017-01-06 (×9): 10 mg via ORAL
  Filled 2016-12-30 (×9): qty 2

## 2016-12-30 MED ORDER — OXYCODONE-ACETAMINOPHEN 10-325 MG PO TABS: 1.0000 | ORAL_TABLET | ORAL | Status: DC | PRN

## 2016-12-30 MED ORDER — AMLODIPINE BESYLATE 5 MG PO TABS
5.0000 mg | ORAL_TABLET | Freq: Every day | ORAL | Status: DC
  Administered 2016-12-30: 5 mg via ORAL
  Filled 2016-12-30: qty 1

## 2016-12-30 MED ORDER — ATORVASTATIN CALCIUM 10 MG PO TABS: 10.0000 mg | ORAL_TABLET | Freq: Every day | ORAL | Status: DC

## 2016-12-30 MED ORDER — ASPIRIN EC 81 MG PO TBEC
81.0000 mg | DELAYED_RELEASE_TABLET | Freq: Every day | ORAL | Status: DC
  Administered 2016-12-30 – 2017-01-07 (×8): 81 mg via ORAL
  Filled 2016-12-30 (×8): qty 1

## 2016-12-30 MED ORDER — ACETAMINOPHEN 325 MG PO TABS
650.0000 mg | ORAL_TABLET | ORAL | Status: DC | PRN
  Administered 2017-01-04: 650 mg via ORAL

## 2016-12-30 MED ORDER — OXYCODONE HCL 5 MG PO TABS
5.0000 mg | ORAL_TABLET | ORAL | Status: DC | PRN
  Administered 2016-12-30 – 2017-01-07 (×29): 5 mg via ORAL
  Filled 2016-12-30 (×25): qty 1

## 2016-12-30 MED ORDER — POTASSIUM CHLORIDE CRYS ER 20 MEQ PO TBCR
20.0000 meq | EXTENDED_RELEASE_TABLET | Freq: Every day | ORAL | Status: DC
  Administered 2016-12-30 – 2017-01-06 (×7): 20 meq via ORAL
  Filled 2016-12-30 (×8): qty 1

## 2016-12-30 MED ORDER — SODIUM CHLORIDE 0.9% FLUSH
3.0000 mL | Freq: Two times a day (BID) | INTRAVENOUS | Status: DC
  Administered 2016-12-30 – 2017-01-06 (×12): 3 mL via INTRAVENOUS

## 2016-12-30 MED ORDER — TAMSULOSIN HCL 0.4 MG PO CAPS
0.4000 mg | ORAL_CAPSULE | Freq: Every day | ORAL | Status: DC
  Administered 2016-12-30 (×2): 0.4 mg via ORAL
  Filled 2016-12-30 (×3): qty 1

## 2016-12-30 MED ORDER — FUROSEMIDE 10 MG/ML IJ SOLN
40.0000 mg | Freq: Two times a day (BID) | INTRAMUSCULAR | Status: DC
  Administered 2016-12-30 – 2017-01-02 (×7): 40 mg via INTRAVENOUS
  Filled 2016-12-30 (×7): qty 4

## 2016-12-30 MED ORDER — SODIUM CHLORIDE 0.9 % IV SOLN
250.0000 mL | INTRAVENOUS | Status: DC | PRN
  Administered 2017-01-03: 500 mL via INTRAVENOUS

## 2016-12-30 NOTE — Progress Notes (Signed)
[facility] OF CARE NOTE Patient: James Burke WGN:[id number]   PCP: [name], MD DOB: [birth year]   DOA: 12/29/2016   DOS: 12/30/2016    Patient was admitted by my colleague Dr. Tamala Julian earlier on 12/30/2016. I have reviewed the H&P as well as assessment and plan and agree with the same. Important changes in the plan are listed below.  Plan of care: Principal Problem:   Acute on chronic combined systolic and diastolic congestive heart failure (HCC) Active Problems:   Essential hypertension, benign   Charcot foot due to diabetes mellitus (HCC)   Acute renal failure superimposed on chronic kidney disease (HCC)   BPH (benign prostatic hyperplasia) Continue diuresis, monitor ins and outs as well as daily weight.  Author: Berle Mull, MD [facility] Pager: [phone] 12/30/2016 5:33 PM   If 7PM-7AM, please contact night-coverage at [email or web address], password [facility]

## 2016-12-30 NOTE — Progress Notes (Signed)
CRITICAL VALUE ALERT  Critical value received:  Troponin 0.03  Date of notification:  12/30/2016  Time of notification:  1836  Critical value read back:Yes.    Nurse who received alert:  Kendrick Ranch  MD notified (1st page):  Harvest Forest  Time of first page:  0153  MD notified (2nd page):  Time of second page:  Responding MD:  Harvest Forest  Time MD responded:  Abril.Chestnut (MD at the bedside)

## 2016-12-30 NOTE — Care Management Note (Signed)
Case Management Note  Patient Details  Name: James Burke MRN: [id number] Date of Birth: 10-25-1965  Subjective/Objective:   Admitted with CHF                Action/Plan: Patient lives with spouse, PCP: [name], MD; has private insurance with BCBS with prescription drug coverage; CM following for DCP  Expected Discharge Date:     Possibly 01/02/2017             Expected Discharge Plan:  Home/Self Care  Discharge planning Services  CM Consult  Status of Service:  In process, will continue to follow  Sherrilyn Rist [phone] 12/30/2016, 9:39 AM

## 2016-12-30 NOTE — Progress Notes (Signed)
Pt educated about the heparin shot but pt made me aware that he is not on any blood thinner per his PCP's advice because he has a ruptured vessel on his left eye. Dr. Harvest Forest was notified and ordered verbally to discontinue the heparin shot and just start SCD. Will continue to monitor pt.

## 2016-12-30 NOTE — Consult Note (Signed)
Cardiology Consult    Patient ID: REFAEL FULOP MRN: [id number], DOB/AGE: July 01, 1966   Admit date: 12/29/2016 Date of Consult: 12/30/2016  Primary Physician: [name], MD Primary Cardiologist: Dr. Stanford Breed Requesting Provider: Dr. Posey Pronto  Reason for Consult: CHF exacerbation  Patient Profile    Mr. Heinzman is a 51 yo male with DM2, chronic systolic and diastolic heart failure, CKD stage IV, HTN, and elevated triglycerides.  He presented to [facility]  12/29/16 with shortness of breath, leg swelling, and chest discomfort worse with inspiration.  He was transferred to [facility] for further management.  VU LIEBMAN is a 51 y.o. male who is being seen today for the evaluation of CHF exacerbation at the request of Dr. Posey Pronto..   Past Medical History   Past Medical History:  Diagnosis Date  . Arthritis, septic, knee ([place])   . C. difficile colitis 04/18/2008  . Congestive heart failure ([place])   . Diabetes mellitus   . DVT (deep venous thrombosis) ([facility])    right leg   . GERD (gastroesophageal reflux disease)   . Renal insufficiency     Past Surgical History:  Procedure Laterality Date  . APPENDECTOMY  1995  . KNEE SURGERY       Allergies  Allergies  Allergen Reactions  . Methocarbamol Diarrhea  . Prednisone     Other reaction(s): Other blindness  . Simvastatin Other (See Comments)    Joint aches.     History of Present Illness    Mr. Ruddy is known to our service and last saw Dr. Stanford Breed in clinic on 09/17/14. At that time, he continued BB, ACEI, and diuretics and had a 8-10yo history of CHF without recent catheterization. Given his CKD, he was scheduled for a myoview which was intermediate risk with moderate to severe reduction in LVEF and nonischemic cardiomyopathy.   He presented to [facility] 12/29/16 with shortness of breath, leg swelling, and chest discomfort. He was hemodynamically stable. CT scan showed large pericardial  effusion with bilateral pleural effusions. He was transferred to [facility] for further management.  Primary team started IV diuresis. On my interview, he states he has had worsening SOB for the past week with chest discomfort when taking a deep breath. Chest pain is aggravated by inspiration, no relieving factors. He has also had 10 lb weight gain, but denies leg swelling. He takes 40 mg lasix daily with second dose PRN. He did not take extra lasix over the past week. His main complaint is not being able to take a deep breath without pain. He denies typical anginal symptoms, palpitations, N/V, and near/syncope.  Inpatient Medications    . amLODipine  5 mg Oral Daily  . aspirin EC  81 mg Oral Daily  . carvedilol  25 mg Oral BID WC  . furosemide  40 mg Intravenous Q12H  . insulin aspart  0-5 Units Subcutaneous QHS  . insulin aspart  0-9 Units Subcutaneous TID WC  . potassium chloride SA  20 mEq Oral Daily  . sodium chloride flush  3 mL Intravenous Q12H  . tamsulosin  0.4 mg Oral QPC supper     Outpatient Medications    Prior to Admission medications   Medication Sig Start Date End Date Taking? Authorizing Provider  AMBULATORY NON FORMULARY MEDICATION Medication Name: glucometer, strips and lancet Dx 250.02 Test twice a day. 01/03/12   [name], MD  amLODipine (NORVASC) 5 MG tablet  04/01/15   Historical Provider, MD  aspirin  81 MG tablet Take 81 mg by mouth daily.    Historical Provider, MD  atorvastatin (LIPITOR) 10 MG tablet Take 1 tablet (10 mg total) by mouth at bedtime. 10/10/16   [name], MD  carvedilol (COREG) 25 MG tablet TAKE 1 TABLET (25 MG TOTAL) BY MOUTH 2 (TWO) TIMES DAILY WITH A MEAL. 11/02/16   [name], MD  Cholecalciferol (VITAMIN D) 2000 units CAPS Take 1-2 capsules by mouth daily.    Historical Provider, MD  furosemide (LASIX) 40 MG tablet TAKE 1 TABLET (40 MG TOTAL) BY MOUTH DAILY. 12/16/16   [name], MD  glipiZIDE (GLUCOTROL) 5  MG tablet Take 1 tablet (5 mg total) by mouth 2 (two) times daily before a meal. 07/21/16   [name], MD  KLOR-CON M20 20 MEQ tablet TAKE 2 TABLET(S) BY MOUTH DAILY 10/10/16   [name], MD  oxyCODONE-acetaminophen [facility]) 10-325 MG per tablet  09/24/14   Historical Provider, MD  tamsulosin (FLOMAX) 0.4 MG CAPS capsule TAKE 2 CAPSULES BY MOUTH DAILY AFTER SUPPER 10/10/16   [name], MD  TRULICITY 7.61 YW/7.3XT SOPN INJECT 0.75MG  EVERY 7 DAYS 11/22/16   [name], MD  zolpidem (AMBIEN) 10 MG tablet TAKE 1 TABLET AT BEDTIME AS NEEDED 11/10/16   [name], MD     Family History    Family History  Problem Relation Age of Onset  . Heart disease      No family history  . Cancer Brother     Social History    Social History   Social History  . Marital status: Married    Spouse name: N/A  . Number of children: 3  . Years of education: N/A   Occupational History  . Not on file.   Social History Main Topics  . Smoking status: Never Smoker  . Smokeless tobacco: Current User    Types: Chew  . Alcohol use No  . Drug use: No  . Sexual activity: Yes   Other Topics Concern  . Not on file   Social History Narrative  . No narrative on file     Review of Systems    General:  No chills, fever, night sweats or weight changes.  Cardiovascular:  + chest pain with inspiration, dyspnea on exertion, No edema, + orthopnea, No palpitations, paroxysmal nocturnal dyspnea. Dermatological: No rash, lesions/masses Respiratory: No cough, dyspnea Urologic: No hematuria, dysuria Abdominal:   No nausea, vomiting, diarrhea, bright red blood per rectum, melena, or hematemesis Neurologic:  No visual changes, changes in mental status. All other systems reviewed and are otherwise negative except as noted above.  Physical Exam    Blood pressure (!) 151/107, pulse 73, temperature 98.1 F (36.7 C), temperature source Oral, resp. rate 18, height 6'  (1.829 m), weight 202 lb 1.6 oz (91.7 kg), SpO2 95 %.  General: Pleasant, NAD Psych: Normal affect. Neuro: Alert and oriented X 3. Moves all extremities spontaneously. HEENT: Normal  Neck: Supple without bruits, + JVD. Lungs:  Resp regular and unlabored, CTA throughout Heart: RRR, positive s4, no rub Abdomen: Soft, non-tender, non-distended, BS + x 4.  Extremities: No clubbing, cyanosis or edema. DP/PT/Radials 1+ on left, right extremity difficult to assess given his charcot foot.  Labs    Troponin (Point of Care Test) No results for input(s): TROPIPOC in the last 72 hours.  Recent Labs  12/30/16 0041 12/30/16 0545  TROPONINI 0.03* 0.04*   Lab Results  Component Value  Date   WBC 6.5 12/30/2016   HGB 10.9 (L) 12/30/2016   HCT 32.1 (L) 12/30/2016   MCV 81.3 12/30/2016   PLT 200 12/30/2016    Recent Labs Lab 12/30/16 0545  NA 138  K 3.6  CL 103  CO2 24  BUN 66*  CREATININE 6.05*  CALCIUM 8.9  GLUCOSE 85   Lab Results  Component Value Date   CHOL 107 12/30/2016   HDL 24 (L) 12/30/2016   LDLCALC 63 12/30/2016   TRIG 100 12/30/2016   Lab Results  Component Value Date   DDIMER 1.69 (H) 06/04/2014     Radiology Studies    No results found.  ECG & Cardiac Imaging    EKG 12/31/16: NSR, down-sloping T waves in leads I, II, V3/4/5/6 - these are not new findings, per EKG 06/25/14  Echocardiogram 12/30/16: pending  Echocardiogram 10/15/14: Study Conclusions - Left ventricle: The cavity size was severely dilated. Wall thickness was increased in a pattern of mild LVH. Systolic function was moderately reduced. The estimated ejection fraction was in the range of 35% to 40%. Diffuse hypokinesis. Doppler parameters are consistent with abnormal left ventricular relaxation (grade 1 diastolic dysfunction). The E/e&' ratio is between 8-15, suggesting indeterminate LV filling pressure. - Mitral valve: Mildly thickened leaflets . There was  trivial regurgitation. - Left atrium: The atrium was normal in size. - Tricuspid valve: There was mild regurgitation. - Pulmonary arteries: PA peak pressure: 26 mm Hg (S). - Inferior vena cava: The vessel was normal in size. The respirophasic diameter changes were in the normal range (= 50%), consistent with normal central venous pressure. - Pericardium, extracardiac: A trivial pericardial effusion was identified posterior to the heart. Features were not consistent with tamponade physiology.  Impressions: - LVEF 35-40%, dilated LV, moderate global hypokinesis, diastolic dysfunction, indeterminate LV filling pressure, trivial AI, mild TR, normal RVSP, trivial posterior pericardial effusion, no tamponade physiolology, normal IVC.  Myoview 10/08/14: Intermediate risk stress nuclear study with moderate to severe reduction in overall LV systolic function. There is a moderate size fixed inferior wall perfusion abnormality. Inferior wall motion hypokinesis is proportionate to remainder of left ventricle. 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  Assessment & Plan    1. Acute on chronic systolic and diastolic heart failure - BNP on admission 2695.8 - home medications that are continued: ASA, coreg, norvasc; was on 40 mg lasix daily with potassium replacement - heart failure medications have been managed by his primary care provider; would benefit from following in heart failure clinic - he is not currently on ACEI/ARB for CKD stage IV; may recommend starting imdur and hydralazine this hospitalization after he is adequately diuresed and pericardial effusion has resolved. - recommend repeat echocardiogram to evaluation heart function, his last echo per [facility] was 2016 - troponin 0.03 --> 0.04, mildly elevated in the presence of acute on chronic combined heart failure likely demand ischemia; pt  denies typical anginal symptoms, no EKG changes from previous tracings. Will plan to diurese and will follow pericardial effusion.   2. Pericardial effusion - pt is hemodynamically stable - symptoms are improving with IV diuresis - will continue to monitor - consider CXR for comparison  3. CKD stage IV - will defer diuresis to nephrology including potassium replacement  - has been evaluated at [facility] for kidney transplant  4. DM - per primary team  5. HLD - continue lipitor  Signed, Ledora Bottcher,  PA-C 12/30/2016, 10:19 AM [phone]  The patient was seen, examined and discussed with Minette Brine , PA-C and I agree with the above.   51 yo male with DM2, chronic systolic and diastolic heart failure, CKD stage IV, HTN, and elevated triglycerides.  He presented to [facility]  12/29/16 with shortness of breath, leg swelling, and chest discomfort worse with inspiration.  He was transferred to [facility] for further management. He was started on iv lasix with good symptomatic response but still has signs of fluid overload with S4 and mild basilar crackles. I would continue iv lasix for now and we will reevaluate in the morning. The problem is Crea 6.0, previously around 3, nephrology is following, he is not on HD yet. BNP 2700. K 3.6, repeat in the am. He has h/o moderate pericardial effusion on echo in 2016, we are awaiting repeat echo results. He had a stress test in 2016 consistent with non-ischemic cardiomyopathy.  He is on max dose of carvedilol and amlodipine 5 mg po daily, he is still hypertensive, we will add amlodipine to 10 mg po daily.   Ena Dawley, MD 12/30/2016

## 2016-12-30 NOTE — Consult Note (Signed)
HPI:  James Burke who is a 51 y.o. male with history of CKD-4, well controlled type 2 diabetes, congestive heart failure who presented to PCP about 48 hours ago with shortness of breath for 10 days that has gotten worse over the last 2 days, and chest discomfort. PCP sent him to [facility] where he had a workup significant for elevated creatinine to 5.7 and CT chest with moderate to large pericardial effusion. He was started on IV Lasix 60 mg and sent to [place] for further evaluation and treatment.  Patient states shortness of breath and chest pain improved after IV Lasix. He usually sleeps on recliner due to his eye problem. However, he also reports feeling chest discomfort when he tries to lie back. [facility]. He denies recent illness, fever, chills, nausea, vomiting, diarrhea, cough, urinary symptoms, urine color change or changes in a urine output. Denies new medication or over-the-counter pain medication.  He is on Lasix 40 mg daily. Occasionally takes 2  tablets when he feels like his weight is up. He denies recent tick bites or travel. He usually watches his sodium intake. He mostly eats at home. Denies smoking, drinking alcohol or recreational drug use.   Off note, patient reports having pericardial effusion in the past that was treated with diuretics. Not sure about the cause of his pericardial effusion at that time.   Past Medical History:  Diagnosis Date  . Arthritis, septic, knee ([facility])   . C. difficile colitis 04/18/2008  . Congestive heart failure ([facility])   . Diabetes mellitus   . DVT (deep venous thrombosis) ([facility])    right leg   . GERD (gastroesophageal reflux disease)   . Renal insufficiency    Past Surgical History:  Procedure Laterality Date  . APPENDECTOMY  1995  . KNEE SURGERY     Social History:  reports that he has never smoked. His smokeless tobacco use includes Chew. He reports that he does not drink  alcohol or use drugs. Allergies:  Allergies  Allergen Reactions  . Methocarbamol Diarrhea  . Prednisone     Other reaction(s): Other blindness  . Simvastatin Other (See Comments)    Joint aches.    Family History  Problem Relation Age of Onset  . Heart disease      No family history  . Cancer Brother     Medications: I have reviewed the patient's current medications.  ROS: Review of systems negative except for pertinent positives and negatives in history of present illness above.   Blood pressure (!) 151/107, pulse 73, temperature 98.1 F (36.7 C), temperature source Oral, resp. rate 18, height 6' (1.829 m), weight 91.7 kg (202 lb 1.6 oz), SpO2 95 %.  GEN: appears well, no apparent distress. Head: normocephalic and atraumatic  Eyes: conjunctiva without injection, sclera anicteric Oropharynx: mmm without erythema or exudation CVS: RRR, nl s1 & s2, no murmurs, no rubs, no JVD or HJR,  no edema RESP: speaks in full sentence, no IWOB, good air movement bilaterally, CTAB GI: BS present & normal, soft, NTND, no guarding, no rebound GU: no suprapubic or CVA tenderness MSK: Right lower extremity wasting with right foot deformity from Charcot's disease SKIN: no apparent skin lesion NEURO: alert and oiented appropriately, no gross deficits. No asterixis.  PSYCH: euthymic mood with congruent affect Results for orders placed or performed during the hospital encounter of 12/29/16 (from the past 48 hour(s))  Glucose, capillary       Status: Abnormal   Collection Time: 12/30/16 12:14 AM  Result Value Ref Range   Glucose-Capillary 170 (H) 65 - 99 mg/dL  Troponin I     Status: Abnormal   Collection Time: 12/30/16 12:41 AM  Result Value Ref Range   Troponin I 0.03 (HH) <0.03 ng/mL    Comment: CRITICAL RESULT CALLED TO, READ BACK BY AND VERIFIED WITH: BUENDIA A,RN 12/30/16 0149 WAYK   Magnesium     Status: None   Collection Time: 12/30/16 12:41 AM  Result Value Ref Range   Magnesium  2.1 1.7 - 2.4 mg/dL  CBC WITH DIFFERENTIAL     Status: Abnormal   Collection Time: 12/30/16 12:41 AM  Result Value Ref Range   WBC 6.5 4.0 - 10.5 K/uL   RBC 3.95 (L) 4.22 - 5.81 MIL/uL   Hemoglobin 10.9 (L) 13.0 - 17.0 g/dL   HCT 32.1 (L) 39.0 - 52.0 %   MCV 81.3 78.0 - 100.0 fL   MCH 27.6 26.0 - 34.0 pg   MCHC 34.0 30.0 - 36.0 g/dL   RDW 14.1 11.5 - 15.5 %   Platelets 200 150 - 400 K/uL   Neutrophils Relative % 77 %   Neutro Abs 5.0 1.7 - 7.7 K/uL   Lymphocytes Relative 14 %   Lymphs Abs 0.9 0.7 - 4.0 K/uL   Monocytes Relative 5 %   Monocytes Absolute 0.3 0.1 - 1.0 K/uL   Eosinophils Relative 3 %   Eosinophils Absolute 0.2 0.0 - 0.7 K/uL   Basophils Relative 1 %   Basophils Absolute 0.0 0.0 - 0.1 K/uL  Brain natriuretic peptide     Status: Abnormal   Collection Time: 12/30/16 12:42 AM  Result Value Ref Range   B Natriuretic Peptide 2,695.8 (H) 0.0 - 100.0 pg/mL  Troponin I     Status: Abnormal   Collection Time: 12/30/16  5:45 AM  Result Value Ref Range   Troponin I 0.04 (HH) <0.03 ng/mL    Comment: CRITICAL VALUE NOTED.  VALUE IS CONSISTENT WITH PREVIOUSLY REPORTED AND CALLED VALUE.  Basic metabolic panel     Status: Abnormal   Collection Time: 12/30/16  5:45 AM  Result Value Ref Range   Sodium 138 135 - 145 mmol/L   Potassium 3.6 3.5 - 5.1 mmol/L   Chloride 103 101 - 111 mmol/L   CO2 24 22 - 32 mmol/L   Glucose, Bld 85 65 - 99 mg/dL   BUN 66 (H) 6 - 20 mg/dL   Creatinine, Ser 6.05 (H) 0.61 - 1.24 mg/dL   Calcium 8.9 8.9 - 10.3 mg/dL   GFR calc non Af Amer 10 (L) >60 mL/min   GFR calc Af Amer 11 (L) >60 mL/min    Comment: (NOTE) The eGFR has been calculated using the CKD EPI equation. This calculation has not been validated in all clinical situations. eGFR's persistently <60 mL/min signify possible Chronic Kidney Disease.    Anion gap 11 5 - 15  Lipid panel     Status: Abnormal   Collection Time: 12/30/16  5:45 AM  Result Value Ref Range   Cholesterol 107 0 -  200 mg/dL   Triglycerides 100 <150 mg/dL   HDL 24 (L) >40 mg/dL   Total CHOL/HDL Ratio 4.5 RATIO   VLDL 20 0 - 40 mg/dL   LDL Cholesterol 63 0 - 99 mg/dL    Comment:        Total Cholesterol/HDL:CHD Risk Coronary Heart Disease Risk Table                       Men   Women  1/2 Average Risk   3.4   3.3  Average Risk       5.0   4.4  2 X Average Risk   9.6   7.1  3 X Average Risk  23.4   11.0        Use the calculated Patient Ratio above and the CHD Risk Table to determine the patient's CHD Risk.        ATP III CLASSIFICATION (LDL):  <100     mg/dL   Optimal  100-129  mg/dL   Near or Above                    Optimal  130-159  mg/dL   Borderline  160-189  mg/dL   High  >190     mg/dL   Very High   Glucose, capillary     Status: None   Collection Time: 12/30/16  7:55 AM  Result Value Ref Range   Glucose-Capillary 90 65 - 99 mg/dL   Comment 1 Notify RN    No results found.  Assessment: AK I/progressive CKD-4: Likely due to ischemic insult to his kidney from his pericardial effusion. Renal ultrasound within normal limits. Unclear about the cause of his pericardial effusion. Not on nephrotoxic medications except Lasix. Negative d-dimer reassuring for thromboembolic process. Serum creatinine 4.5 in 09/2015--> 5.77 yesterday-->6.05 today. BUN elevated to 66 but no uremic symptoms. He continues to have good urine output.   Will continue IV Lasix 40 mg twice a day  Strict I&O's and daily weights  Urinalysis  Urine creatinine and urea  Autoimmune labs  We will check for coxsackie and rickettsial titers.   Hepatitis panel  Daily renal function  No indication for emergent dialysis. However, he may need dialysis at some point soon  Pericardial effusion: CT chest on 4/26 at [facility] was read as moderately large pericardial effusion. Exam without JVD, HJR, distant heart sound or friction rub. Dyspnea and chest discomfort improved with IV Lasix  TTE  Continue  IV Lasix  Autoimmune and viral workup as above  HFrEF: TTE in 2016 with EF of 35-40% and G1DD. Doesn't appear fluid overloaded. Lung exam without crackles.  We'll continue IV Lasix to help with pericardial effusion  TTE as above  DM-2: well controlled.   Per primary  Other issues per primary  James Burke 12/30/2016, 9:06 AM   I have seen and examined this patient and agree with plan and assessment in the above note with renal recommendations/intervention highlighted.  Pt followed by Dr. Patel and has been referred to [facility] for tx evaluation.  Unfortunately has not had vascular access placed, will consult [facility] for vein mapping and placement of AVF/AVG soon.  He is completely free of uremic symptoms, and don't feel that this is uremic pericarditis.  Likely due to decompensated CHF, will continue to diurese and await Cardiac workup.  No indication for dialysis at this time.   A ,MD 12/30/2016 10:46 AM (patient was seen with Dr. Gonfa and Dr. Mayo during morning rounds and case discussed with Dr. Patel)     

## 2016-12-30 NOTE — H&P (Signed)
History and Physical    James Burke CNO:[id number] DOB: 02/24/1966 DOA: 12/29/2016  Referring MD/NP/PA: Noland Fordyce, PA-C PCP: [name], MD  Patient coming from: Transfer from [facility]  Chief Complaint: Chest pain and shortness of breath  HPI: James Burke is a 51 y.o. male with medical history significant of DM type II, Charcot's right foot, CHF class EF 35-40% w/ grade 1 dFx, and CKD stage III; who presented with progressively worsening SOB and and chest discomfort 1 week. Reports chest pain is worse with deep inspiration. Associated symptoms include weight gain of approximately 10 pounds and intermittent leg swelling. Denies having any fever, chills, rash, dysuria, abdominal pain, nausea, or vomiting. Back in 2016 he had similar issues with developing fluid around his heart for which he was followed by Dr. Kirk Ruths cardiology and symptoms improved with diuretics. Currently, he is on 40 mg of Lasix daily and reports previously being advised to increase Lasix to twice daily as needed. He denies doubling up on his Lasix during this period in time to try and relieve symptoms. Patient is followed by Dr. Posey Pronto of nephrology for chronic kidney disease, and was in the process of setting the patient up for likely need of dialysis. He has not noticed any decrease in his urine output. Patient's previous baseline creatinine had been 3.56 back in 11/2015. Review of records also shows the patient was being evaluated for kidney transplant at [place] and he states that he has follow-up testing scheduled for May 9.   He initially went to [facility] yesterday for evaluation. He was found to have stabLoss once with O2 saturations maintained on room air. Labs revealed WBC 6.6, hemoglobin 11, platelets 211, sodium 140, potassium 4.1, chloride 101, CO2 25, BUN 66, creatinine 5.7, glucose 111, proBNP 59,000, and troponin T 0.087. CT scan of the chest revealed large  pericardial effusion with bilateral pleural effusions. The patient was given 60 mg of Lasix IV as he still makes urine. Dr. Mercy Moore of nephrology was notified regarding transfer patient and it was noted that he will see the patient likely a.m. He is transferred to a telemetry bed for further treatment here [facility] for need of consultative services.    ED Course: As seen above  Review of Systems: As per HPI otherwise 10 point review of systems negative.   Past Medical History:  Diagnosis Date  . 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 C. difficile colitis 04/18/2008  . Congestive heart failure ([place])   . Diabetes mellitus   . DVT (deep venous thrombosis) ([facility])    right leg   . GERD (gastroesophageal reflux disease)   . Renal insufficiency     Past Surgical History:  Procedure Laterality Date  . APPENDECTOMY  1995  . KNEE SURGERY       reports that he has never smoked. His smokeless tobacco use includes Chew. He reports that he does not drink alcohol or use drugs.  Allergies  Allergen Reactions  . Methocarbamol Diarrhea  . Prednisone     Other reaction(s): Other blindness  . Simvastatin Other (See Comments)    Joint aches.     Family History  Problem Relation Age of Onset  . Heart disease      No family history  . Cancer Brother     Prior to Admission medications   Medication Sig Start Date End Date Taking? Authorizing Provider  AMBULATORY NON FORMULARY MEDICATION Medication Name: glucometer, strips and lancet Dx 250.02 Test  twice a day. 01/03/12   [name], MD  amLODipine (NORVASC) 5 MG tablet  04/01/15   Historical Provider, MD  aspirin 81 MG tablet Take 81 mg by mouth daily.    Historical Provider, MD  atorvastatin (LIPITOR) 10 MG tablet Take 1 tablet (10 mg total) by mouth at bedtime. 10/10/16   [name], MD  carvedilol (COREG) 25 MG tablet TAKE 1 TABLET (25 MG TOTAL) BY MOUTH 2 (TWO) TIMES DAILY WITH A MEAL. 11/02/16   [name], MD    Cholecalciferol (VITAMIN D) 2000 units CAPS Take 1-2 capsules by mouth daily.    Historical Provider, MD  furosemide (LASIX) 40 MG tablet TAKE 1 TABLET (40 MG TOTAL) BY MOUTH DAILY. 12/16/16   [name], MD  glipiZIDE (GLUCOTROL) 5 MG tablet Take 1 tablet (5 mg total) by mouth 2 (two) times daily before a meal. 07/21/16   [name], MD  KLOR-CON M20 20 MEQ tablet TAKE 2 TABLET(S) BY MOUTH DAILY 10/10/16   [name], MD  oxyCODONE-acetaminophen [facility]) 10-325 MG per tablet  09/24/14   Historical Provider, MD  tamsulosin (FLOMAX) 0.4 MG CAPS capsule TAKE 2 CAPSULES BY MOUTH DAILY AFTER SUPPER 10/10/16   [name], MD  TRULICITY 3.41 PF/7.9KW SOPN INJECT 0.75MG  EVERY 7 DAYS 11/22/16   [name], MD  zolpidem (AMBIEN) 10 MG tablet TAKE 1 TABLET AT BEDTIME AS NEEDED 11/10/16   [name], MD    Physical Exam:  Constitutional: Overweight male in NAD, calm, comfortable Vitals:   12/30/16 0003  BP: (!) 168/113  Pulse: 87  Resp: 16  Temp: 98.6 F (37 C)  SpO2: 95%  Weight: 91.7 kg (202 lb 1.6 oz)  Height: 6' (1.829 m)   Eyes: PERRL, lids and conjunctivae normal ENMT: Mucous membranes are moist. Posterior pharynx clear of any exudate or lesions.Neck: normal, supple, no masses, no thyromegaly Respiratory: Normal respiratory effort with crackles appreciated. Cardiovascular: Regular rate and rhythm, no murmurs / rubs / gallops. No extremity edema. 2+ pedal pulses. No carotid bruits.  Abdomen: no tenderness, no masses palpated. No hepatosplenomegaly. Bowel sounds positive.  Musculoskeletal: no clubbing / cyanosis. Deformity of right leg with muscle atrophy. Skin: no rashes, lesions, ulcers. No induration Neurologic: CN 2-12 grossly intact. Sensation intact, DTR normal. Strength 5/5 in all 4.  Psychiatric: Normal judgment and insight. Alert and oriented x 3. Normal mood.     Labs on Admission: I have personally reviewed following labs  and imaging studies  CBC: No results for input(s): WBC, NEUTROABS, HGB, HCT, MCV, PLT in the last 168 hours. Basic Metabolic Panel: No results for input(s): NA, K, CL, CO2, GLUCOSE, BUN, CREATININE, CALCIUM, MG, PHOS in the last 168 hours. GFR: CrCl cannot be calculated (Patient's most recent lab result is older than the maximum 21 days allowed.). Liver Function Tests: No results for input(s): AST, ALT, ALKPHOS, BILITOT, PROT, ALBUMIN in the last 168 hours. No results for input(s): LIPASE, AMYLASE in the last 168 hours. No results for input(s): AMMONIA in the last 168 hours. Coagulation Profile: No results for input(s): INR, PROTIME in the last 168 hours. Cardiac Enzymes: No results for input(s): CKTOTAL, CKMB, CKMBINDEX, TROPONINI in the last 168 hours. BNP (last 3 results) No results for input(s): PROBNP in the last 8760 hours. HbA1C: No results for input(s): HGBA1C in the last 72 hours. CBG:  Recent Labs Lab 12/30/16 0014  GLUCAP 170*   Lipid Profile: No results for  input(s): CHOL, HDL, LDLCALC, TRIG, CHOLHDL, LDLDIRECT in the last 72 hours. Thyroid Function Tests: No results for input(s): TSH, T4TOTAL, FREET4, T3FREE, THYROIDAB in the last 72 hours. Anemia Panel: No results for input(s): VITAMINB12, FOLATE, FERRITIN, TIBC, IRON, RETICCTPCT in the last 72 hours. Urine analysis:    Component Value Date/Time   BILIRUBINUR negative 10/24/2014 1659   PROTEINUR >=300mg /dl 10/24/2014 1659   UROBILINOGEN 0.2 10/24/2014 1659   NITRITE negative 10/24/2014 1659   LEUKOCYTESUR Negative 10/24/2014 1659   Sepsis Labs: No results found for this or any previous visit (from the past 240 hour(s)).   Radiological Exams on Admission: No results found.  EKG: Independently reviewed. Sinus rhythm with prolonged QTC of 465  Assessment/Plan Combined systolic and diastolic congestive heart failure, large pericardial effusion: Acute on chronic. Patient presents with complaints of  shortness of breath. Imaging studies at outside facility show pulmonary edema with large pericardial effusion with bilateral pleural effusions. Patient was initially given 60 mg of Lasix IV the outside facility. - Admit to a telemetry bed - Heart Failure Course at admission  - Strict I&Os and daily weights  - Lasix 40 mg IV BID - Adjust diuresis as needed  - Check echocardiogram  - No ACE-I 2/2 renal insufficiency - Consult cardiology in a.m.  Chest pain, Elevated troponin, Large pericardial effusion: Acute. - Trend cardiac enzymes - Check EKG - Lipid panel in a.m. - Message sent to [facility]  Acute renal failure superimposed on chronic kidney disease IV: Patient followed in the outpatient setting by Dr. Posey Pronto. Review of record shows patient being evaluated and tibia be for possible renal transplant. - Repeat BMP in a.m.  - Per Nephrology who will see in a.m.  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 - Hypoglycemic protocols - hold glipizide - CBGs with sensitive SSI - Oxycodone when necessary pain   Hyperlipidemia: Patient reports no longer being on atorvastatin.  BPH - Continue Flomax   DVT prophylaxis: SCD per patient request as reports broken blood vessel and I Code Status: Full Family Communication: No family present at bedside  Disposition Plan:  Consults called: Nephrology  Admission status: Inpatient  Norval Morton MD [facility] Pager [phone]  If 7PM-7AM, please contact night-coverage [email or web address] Password [facility]  12/30/2016, 12:19 AM

## 2016-12-31 ENCOUNTER — Inpatient Hospital Stay (HOSPITAL_COMMUNITY): Payer: BLUE CROSS/BLUE SHIELD

## 2016-12-31 DIAGNOSIS — I130 Hypertensive heart and chronic kidney disease with heart failure and stage 1 through stage 4 chronic kidney disease, or unspecified chronic kidney disease: Secondary | ICD-10-CM

## 2016-12-31 DIAGNOSIS — I428 Other cardiomyopathies: Secondary | ICD-10-CM

## 2016-12-31 DIAGNOSIS — I501 Left ventricular failure: Secondary | ICD-10-CM

## 2016-12-31 DIAGNOSIS — N179 Acute kidney failure, unspecified: Secondary | ICD-10-CM

## 2016-12-31 LAB — RENAL FUNCTION PANEL
Albumin: 3.1 g/dL — ABNORMAL LOW (ref 3.5–5.0)
Anion gap: 12 (ref 5–15)
BUN: 72 mg/dL — ABNORMAL HIGH (ref 6–20)
CO2: 24 mmol/L (ref 22–32)
Calcium: 8.6 mg/dL — ABNORMAL LOW (ref 8.9–10.3)
Chloride: 100 mmol/L — ABNORMAL LOW (ref 101–111)
Creatinine, Ser: 6.28 mg/dL — ABNORMAL HIGH (ref 0.61–1.24)
GFR calc Af Amer: 11 mL/min — ABNORMAL LOW
GFR calc non Af Amer: 9 mL/min — ABNORMAL LOW
Glucose, Bld: 97 mg/dL (ref 65–99)
Phosphorus: 4.5 mg/dL (ref 2.5–4.6)
Potassium: 3.5 mmol/L (ref 3.5–5.1)
Sodium: 136 mmol/L (ref 135–145)

## 2016-12-31 LAB — GLUCOSE, CAPILLARY
Glucose-Capillary: 88 mg/dL (ref 65–99)
Glucose-Capillary: 126 mg/dL — ABNORMAL HIGH (ref 65–99)
Glucose-Capillary: 115 mg/dL — ABNORMAL HIGH (ref 65–99)
Glucose-Capillary: 151 mg/dL — ABNORMAL HIGH (ref 65–99)

## 2016-12-31 LAB — ECHOCARDIOGRAM COMPLETE
Height: 72 in
Weight: 3179.2 oz

## 2016-12-31 LAB — CBC
HCT: 28.6 % — ABNORMAL LOW (ref 39.0–52.0)
Hemoglobin: 9.4 g/dL — ABNORMAL LOW (ref 13.0–17.0)
MCH: 26.6 pg (ref 26.0–34.0)
MCHC: 32.9 g/dL (ref 30.0–36.0)
MCV: 80.8 fL (ref 78.0–100.0)
Platelets: 166 10*3/uL (ref 150–400)
RBC: 3.54 MIL/uL — ABNORMAL LOW (ref 4.22–5.81)
RDW: 13.8 % (ref 11.5–15.5)
WBC: 5.3 10*3/uL (ref 4.0–10.5)

## 2016-12-31 LAB — C3 COMPLEMENT: C3 Complement: 133 mg/dL (ref 82–167)

## 2016-12-31 LAB — MAGNESIUM: Magnesium: 2.1 mg/dL (ref 1.7–2.4)

## 2016-12-31 LAB — C4 COMPLEMENT: Complement C4, Body Fluid: 29 mg/dL (ref 14–44)

## 2016-12-31 LAB — ANTI-DNA ANTIBODY, DOUBLE-STRANDED: ds DNA Ab: <1 [IU]/mL (ref 0–9)

## 2016-12-31 LAB — MPO/PR-3 (ANCA) ANTIBODIES
ANCA Proteinase 3: 4 U/mL — ABNORMAL HIGH (ref 0.0–3.5)
Myeloperoxidase Abs: <9 U/mL (ref 0.0–9.0)

## 2016-12-31 LAB — HEPATITIS PANEL, ACUTE
HCV Ab: <0.1 {s_co_ratio} (ref 0.0–0.9)
Hep A IgM: NEGATIVE
Hep B C IgM: NEGATIVE
Hepatitis B Surface Ag: NEGATIVE

## 2016-12-31 LAB — UREA NITROGEN, URINE: Urea Nitrogen, Ur: 320 mg/dL

## 2016-12-31 LAB — ANTISTREPTOLYSIN O TITER: ASO: <20 IU/mL (ref 0.0–200.0)

## 2016-12-31 MED ORDER — AMLODIPINE BESYLATE 5 MG PO TABS: 5.0000 mg | ORAL_TABLET | Freq: Once | ORAL | Status: DC

## 2016-12-31 MED ORDER — AMLODIPINE BESYLATE 10 MG PO TABS
10.0000 mg | ORAL_TABLET | Freq: Every day | ORAL | Status: DC
  Administered 2016-12-31 – 2017-01-01 (×2): 10 mg via ORAL
  Filled 2016-12-31 (×4): qty 1

## 2016-12-31 MED ORDER — TAMSULOSIN HCL 0.4 MG PO CAPS: 0.4000 mg | ORAL_CAPSULE | Freq: Two times a day (BID) | ORAL | Status: DC

## 2016-12-31 MED ORDER — DULAGLUTIDE 0.75 MG/0.5ML ~~LOC~~ SOAJ
0.7500 mg | SUBCUTANEOUS | Status: DC
  Administered 2016-12-31: 0.75 mg via SUBCUTANEOUS
  Filled 2016-12-31 (×4): qty 1

## 2016-12-31 MED ORDER — TAMSULOSIN HCL 0.4 MG PO CAPS
0.4000 mg | ORAL_CAPSULE | Freq: Two times a day (BID) | ORAL | Status: DC
  Administered 2016-12-31 – 2017-01-07 (×15): 0.4 mg via ORAL
  Filled 2016-12-31 (×14): qty 1

## 2016-12-31 NOTE — Progress Notes (Signed)
[facility] Progress Note  Patient: James Burke OVZ:[id number]   PCP: [name], MD DOB: 28-Jun-1966   DOA: 12/29/2016   DOS: 12/31/2016   Date of Service: the patient was seen and examined on 12/31/2016  Subjective: pt has been having no acute complains, shortness of breath is better, no chest pain.   Brief hospital course: Pt. with PMH of  DM type II, Charcot's right foot, CHF class EF 35-40%, andCKD stage III; admitted on 12/29/2016, with complaint of shortnes of breath , was found to have acute on chronic combined CHF with pericardial effusion. Currently further plan is continue IV diuresis and arrange for HD.  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 Posey Pronto.  Following up at [facility] also for possible transplant. Nephrology following.  Planning to arrange HD, ANCA elevated.   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 - Hypoglycemic protocols - hold glipizide - CBGs with sensitive SSI - Oxycodone when necessary pain   Hyperlipidemia:  Patient reports no longer being on atorvastatin.  BPH - Continue Flomax   Bowel regimen: last BM 12/31/2016 Diet: cardiac diet DVT Prophylaxis: subcutaneous Heparin  Advance goals of care discussion: full code  Family Communication: family was present at bedside, at the time of interview. The pt provided permission to discuss medical plan with the family. Opportunity was given to  ask question and all questions were answered satisfactorily.   Disposition:  Discharge to home. Expected discharge date: TBD  Consultants: cardiology, vascular surgery, nephrology Procedures: Echocardiogram.  Antibiotics: Anti-infectives    None       Objective: Physical Exam: Vitals:   12/30/16 2049 12/31/16 0030 12/31/16 0450 12/31/16 1213  BP: (!) 146/96 139/89 (!) 149/95 (!) 154/97  Pulse: 71 65 72 74  Resp: 18 18 20 20   Temp: 97.5 F (36.4 C) 98.1 F (36.7 C) 98 F (36.7 C) 98 F (36.7 C)  TempSrc: Oral Oral Oral Oral  SpO2: 94% 96% 98% 99%  Weight:   90.1 kg (198 lb 11.2 oz)   Height:        Intake/Output Summary (Last 24 hours) at 12/31/16 1357 Last data filed at 12/31/16 0935  Gross per 24 hour  Intake              960 ml  Output             1625 ml  Net             -665 ml   Filed Weights   12/30/16 0003 12/31/16 0450  Weight: 91.7 kg (202 lb 1.6 oz) 90.1 kg (198 lb 11.2 oz)   General: Alert, Awake and Oriented to Time, Place and Person. Appear in mild distress, affect appropriate Eyes: PERRL, Conjunctiva normal ENT: Oral Mucosa clear moist. Neck: no JVD, no Abnormal Mass Or lumps Cardiovascular: S1 and S2 Present, no Murmur, Respiratory: Bilateral Air entry equal and Decreased, no use of accessory muscle, Clear to Auscultation, no Crackles, no wheezes Abdomen: Bowel  Sound present, Soft and no tenderness Skin: no redness, no Rash, no induration Extremities: trace Pedal edema, no calf tenderness Neurologic: Grossly no focal neuro deficit. Bilaterally Equal motor strength  Data Reviewed: CBC:  Recent Labs Lab 12/30/16 0041 12/31/16 0241  WBC 6.5 5.3  NEUTROABS 5.0  --   HGB 10.9* 9.4*  HCT 32.1* 28.6*  MCV 81.3 80.8  PLT 200 621   Basic Metabolic Panel:  Recent Labs Lab 12/30/16 0041 12/30/16 0545 12/30/16 0830 12/31/16 0241  NA  --  138  --  136  K  --  3.6  --  3.5  CL  --  103  --  100*  CO2  --  24  --  24  GLUCOSE  --  85   --  97  BUN  --  66*  --  72*  CREATININE  --  6.05*  --  6.28*  CALCIUM  --  8.9  --  8.6*  MG 2.1  --  2.2 2.1  PHOS  --   --  3.7 4.5    Liver Function Tests:  Recent Labs Lab 12/30/16 1354 12/31/16 0241  AST 13*  --   ALT 14*  --   ALKPHOS 47  --   BILITOT 0.5  --   PROT 6.3*  --   ALBUMIN 3.4* 3.1*   No results for input(s): LIPASE, AMYLASE in the last 168 hours. No results for input(s): AMMONIA in the last 168 hours. Coagulation Profile: No results for input(s): INR, PROTIME in the last 168 hours. Cardiac Enzymes:  Recent Labs Lab 12/30/16 0041 12/30/16 0545 12/30/16 1354  TROPONINI 0.03* 0.04* 0.03*   BNP (last 3 results) No results for input(s): PROBNP in the last 8760 hours. CBG:  Recent Labs Lab 12/30/16 1117 12/30/16 1705 12/30/16 2138 12/31/16 0731 12/31/16 1131  GLUCAP 123* 135* 95 88 126*   Studies: No results found.  Scheduled Meds: . amLODipine  10 mg Oral Daily  . aspirin EC  81 mg Oral Daily  . carvedilol  25 mg Oral BID WC  . Dulaglutide  0.75 mg Subcutaneous Weekly  . furosemide  40 mg Intravenous Q12H  . insulin aspart  0-5 Units Subcutaneous QHS  . insulin aspart  0-9 Units Subcutaneous TID WC  . potassium chloride SA  20 mEq Oral Daily  . sodium chloride flush  3 mL Intravenous Q12H  . tamsulosin  0.4 mg Oral BID   Continuous Infusions: . sodium chloride     PRN Meds: sodium chloride, acetaminophen, ondansetron (ZOFRAN) IV, oxyCODONE-acetaminophen **AND** oxyCODONE, sodium chloride flush, zolpidem  Time spent: 30 minutes  Author: Berle Mull, MD [facility] Pager: [phone] 12/31/2016 1:57 PM  If 7PM-7AM, please contact night-coverage at [email or web address], password [facility]

## 2016-12-31 NOTE — Progress Notes (Signed)
  Echocardiogram 2D Echocardiogram has been performed.  James Burke 12/31/2016, 10:11 AM

## 2016-12-31 NOTE — Progress Notes (Signed)
Patient Name: James Burke Date of Encounter: 12/31/2016  Principal Problem:   Acute on chronic combined systolic and diastolic congestive heart failure ([place]) Active Problems:   Essential hypertension, benign   Charcot foot due to diabetes mellitus ([place])   Acute renal failure superimposed on chronic kidney disease ([place])   BPH (benign prostatic hyperplasia)   Length of Stay: 2  SUBJECTIVE  The patient states that he feels significantly better, SOB has improved, he denies CP.  CURRENT MEDS . amLODipine  5 mg Oral Daily  . aspirin EC  81 mg Oral Daily  . carvedilol  25 mg Oral BID WC  . furosemide  40 mg Intravenous Q12H  . insulin aspart  0-5 Units Subcutaneous QHS  . insulin aspart  0-9 Units Subcutaneous TID WC  . potassium chloride SA  20 mEq Oral Daily  . sodium chloride flush  3 mL Intravenous Q12H  . tamsulosin  0.4 mg Oral BID   OBJECTIVE  Vitals:   12/30/16 0757 12/30/16 2049 12/31/16 0030 12/31/16 0450  BP: (!) 151/107 (!) 146/96 139/89 (!) 149/95  Pulse: 73 71 65 72  Resp: 18 18 18 20   Temp: 98.1 F (36.7 C) 97.5 F (36.4 C) 98.1 F (36.7 C) 98 F (36.7 C)  TempSrc: Oral Oral Oral Oral  SpO2: 95% 94% 96% 98%  Weight:    198 lb 11.2 oz (90.1 kg)  Height:        Intake/Output Summary (Last 24 hours) at 12/31/16 1027 Last data filed at 12/31/16 0935  Gross per 24 hour  Intake             1200 ml  Output             1825 ml  Net             -625 ml   Filed Weights   12/30/16 0003 12/31/16 0450  Weight: 202 lb 1.6 oz (91.7 kg) 198 lb 11.2 oz (90.1 kg)   PHYSICAL EXAM  General: Pleasant, NAD. Neuro: Alert and oriented X 3. Moves all extremities spontaneously. Psych: Normal affect. HEENT:  Normal  Neck: Supple without bruits or JVD. Lungs:  Resp regular and unlabored, CTA. Heart: RRR no s3, present s4, 2/6 systolic murmurs. Abdomen: Soft, non-tender, non-distended, BS + x 4.  Extremities: No clubbing, cyanosis or edema. DP/PT/Radials 2+ and  equal bilaterally.  Accessory Clinical Findings  CBC  Recent Labs  12/30/16 0041 12/31/16 0241  WBC 6.5 5.3  NEUTROABS 5.0  --   HGB 10.9* 9.4*  HCT 32.1* 28.6*  MCV 81.3 80.8  PLT 200 937   Basic Metabolic Panel  Recent Labs  12/30/16 0545 12/30/16 0830 12/31/16 0241  NA 138  --  136  K 3.6  --  3.5  CL 103  --  100*  CO2 24  --  24  GLUCOSE 85  --  97  BUN 66*  --  72*  CREATININE 6.05*  --  6.28*  CALCIUM 8.9  --  8.6*  MG  --  2.2 2.1  PHOS  --  3.7 4.5   Liver Function Tests  Recent Labs  12/30/16 1354 12/31/16 0241  AST 13*  --   ALT 14*  --   ALKPHOS 47  --   BILITOT 0.5  --   PROT 6.3*  --   ALBUMIN 3.4* 3.1*   No results for input(s): LIPASE, AMYLASE in the last 72 hours. Cardiac Enzymes  Recent  Labs  12/30/16 0041 12/30/16 0545 12/30/16 1354  TROPONINI 0.03* 0.04* 0.03*    Recent Labs  12/30/16 0545  CHOL 107  HDL 24*  LDLCALC 63  TRIG 100  CHOLHDL 4.5   Thyroid Function Tests  Recent Labs  12/30/16 1354  TSH 2.218    Radiology/Studies  US Renal  Result Date: 12/30/2016 CLINICAL DATA:  Acute renal injury EXAM: RENAL / URINARY TRACT ULTRASOUND COMPLETE COMPARISON:  None. FINDINGS: Right Kidney: Length: 10.9 cm. Echogenicity within normal limits. No mass or hydronephrosis visualized. Left Kidney: Length: 10.7 cm. Echogenicity within normal limits. No mass or hydronephrosis visualized. Bladder: Decompressed. Small right-sided pleural effusion is noted. IMPRESSION: Kidneys are within normal limits. Small right-sided pleural effusion. Electronically Signed   By: Inez Catalina M.D.   On: 12/30/2016 12:19    TELE: SR, personally reviewed   ASSESSMENT AND PLAN   1. Acute on chronic systolic and diastolic heart failure 2. Pericardial effusion 3. CKD stage IV 4. DM 5. HLD  51 yo male with DM2, chronic systolic and diastolic heart failure, CKD stage IV, HTN, and elevated triglycerides.  He presented to [facility]  12/29/16 with shortness of breath, leg swelling, and chest discomfort worse with inspiration.  He was transferred to [facility] for further management. He was started on iv lasix with good symptomatic response but still has signs of fluid overload with S4 and mild basilar crackles. I would continue iv lasix for now and we will reevaluate in the morning. The problem is Crea 6.0, previously around 3, nephrology is following, he is not on HD yet. BNP 2700. K 3.6, repeat in the am. He has h/o moderate pericardial effusion on echo in 2016, we are awaiting repeat echo results. He had a stress test in 2016 consistent with non-ischemic cardiomyopathy.  He is on max dose of carvedilol and amlodipine 5 mg po daily, he is still hypertensive, we will add amlodipine to 10 mg po daily.   I have personally reviewed his echocardiogram that shows:  LVEF is severely decreased with moderate LVH and diffuse hypokinesis. RVEF is moderately decreased. There is severe pulmonary hypertension.  There is severe pericardial effusion with invagination of the RV and RA but good RV filling in diastole. This is consistent with pre-tamponade state. The patient has severe pulmonary hypertension and no signs of tamponade.   No urgent pericardiocentesis is necessary. Nephrology is considering early hemodialysis considering worsening Crea. We will repeat echocardiogram on Monday.   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  Ena Dawley, MD 12/30/2016   Signed, Ena Dawley MD, [facility] 12/31/2016

## 2016-12-31 NOTE — Progress Notes (Signed)
Pt laying in bed wife at the bedside. No needs at this time. Spoke to pt and wife about regarding bring in trulicity for pharmacy to dispense.

## 2016-12-31 NOTE — Progress Notes (Signed)
[facility] Progress Note    Assessment/ Plan:   1. AKI/progressive CKD-4: Likely due to ischemic insult to his kidney from his pericardial effusion and worsening systolic heart failure. Renal ultrasound normal. Work-up negative thus far. UPC 8.96. Good UOP with current dose of Lasix.  Continue IV Lasix 40 mg twice a day  F/u remainder of autoimmune labs  Coxsackie and rickettsial titers pending   Urine urea pending  Daily renal function  May be heading towards HD. Vein mapping ordered today. Plan to consult [facility] on Monday for [facility] and fistula if creatinine continues to worsen.  2. Pericardial effusion: CT chest on 4/26 at [facility] was read as moderately large pericardial effusion. ECHO 4/28 with severe pericardial effusion, consistent with pre-tamponade state.  Cardiology following. May need to have pericardiocentesis via IR on Monday if he won't be started on HD. No urgent pericardiocentesis is necessary.  Plan to repeat ECHO on Monday  Continue IV Lasix  Autoimmune and viral workup as above  3. HFrEF: Worsening. TTE in 2016 with EF of 35-40% and G1DD. Repeat ECHO 4/28 with EF 15-20% and diffuse hypokinesis.  Plan per Cardiology  Lasix as above  Also on Coreg and Norvasc.  TTE as above  4. DM-2: well controlled.   Per primary  Subjective:    Patient states he is feeling better today. No chest pain. He feels like this is the first day he can take a deep breath.   Objective:   BP (!) 149/95 (BP Location: Right Arm)   Pulse 72   Temp 98 F (36.7 C) (Oral)   Resp 20   Ht 6' (1.829 m)   Wt 198 lb 11.2 oz (90.1 kg)   SpO2 98%   BMI 26.95 kg/m   Intake/Output Summary (Last 24 hours) at 12/31/16 1202 Last data filed at 12/31/16 0935  Gross per 24 hour  Intake             1200 ml  Output             1825 ml  Net             -625 ml   Weight change: -3 lb 6.4 oz (-1.542 kg)  Physical Exam: GEN: laying in bed, in  NAD. CVS: RRR, no rubs RESP: CTAB, normal work of breathing GI: +BS, soft, non-tender, non-distended MSK: Right lower extremity wasting with right foot deformity from Charcot's disease  Imaging: US Renal  Result Date: 12/30/2016 CLINICAL DATA:  Acute renal injury EXAM: RENAL / URINARY TRACT ULTRASOUND COMPLETE COMPARISON:  None. FINDINGS: Right Kidney: Length: 10.9 cm. Echogenicity within normal limits. No mass or hydronephrosis visualized. Left Kidney: Length: 10.7 cm. Echogenicity within normal limits. No mass or hydronephrosis visualized. Bladder: Decompressed. Small right-sided pleural effusion is noted. IMPRESSION: Kidneys are within normal limits. Small right-sided pleural effusion. Electronically Signed   By: Inez Catalina M.D.   On: 12/30/2016 12:19    Labs: BMET  Recent Labs Lab 12/30/16 0545 12/30/16 0830 12/31/16 0241  NA 138  --  136  K 3.6  --  3.5  CL 103  --  100*  CO2 24  --  24  GLUCOSE 85  --  97  BUN 66*  --  72*  CREATININE 6.05*  --  6.28*  CALCIUM 8.9  --  8.6*  PHOS  --  3.7 4.5   CBC  Recent Labs Lab 12/30/16 0041 12/31/16 0241  WBC 6.5 5.3  NEUTROABS  5.0  --   HGB 10.9* 9.4*  HCT 32.1* 28.6*  MCV 81.3 80.8  PLT 200 166    Medications:    . amLODipine  10 mg Oral Daily  . aspirin EC  81 mg Oral Daily  . carvedilol  25 mg Oral BID WC  . furosemide  40 mg Intravenous Q12H  . insulin aspart  0-5 Units Subcutaneous QHS  . insulin aspart  0-9 Units Subcutaneous TID WC  . potassium chloride SA  20 mEq Oral Daily  . sodium chloride flush  3 mL Intravenous Q12H  . tamsulosin  0.4 mg Oral BID      Hyman Bible, MD PGY-2 Family Medicine Resident 12/31/2016, 12:02 PM   I have seen and examined this patient and agree with plan and assessment in the above note with renal recommendations/intervention highlighted.  Mr. Prosperi may be approaching ESRD, will follow UOP and daily Scr with IV lasix.  If his numbers and pericardial effusion do not  respond to therapy, will likely need to initiate hemodialysis on Monday.  Currently free from any uremic symptoms. Broadus Marcial A Latravis Grine,MD 12/31/2016 1:23 PM

## 2017-01-01 DIAGNOSIS — Z992 Dependence on renal dialysis: Secondary | ICD-10-CM

## 2017-01-01 DIAGNOSIS — N186 End stage renal disease: Secondary | ICD-10-CM

## 2017-01-01 DIAGNOSIS — N184 Chronic kidney disease, stage 4 (severe): Secondary | ICD-10-CM

## 2017-01-01 LAB — RENAL FUNCTION PANEL
Albumin: 3.4 g/dL — ABNORMAL LOW (ref 3.5–5.0)
Anion gap: 12 (ref 5–15)
BUN: 74 mg/dL — ABNORMAL HIGH (ref 6–20)
CO2: 24 mmol/L (ref 22–32)
Calcium: 9.1 mg/dL (ref 8.9–10.3)
Chloride: 101 mmol/L (ref 101–111)
Creatinine, Ser: 6.3 mg/dL — ABNORMAL HIGH (ref 0.61–1.24)
GFR calc Af Amer: 11 mL/min — ABNORMAL LOW (ref >60)
GFR calc non Af Amer: 9 mL/min — ABNORMAL LOW (ref >60)
Glucose, Bld: 120 mg/dL — ABNORMAL HIGH (ref 65–99)
Phosphorus: 4.7 mg/dL — ABNORMAL HIGH (ref 2.5–4.6)
Potassium: 3.5 mmol/L (ref 3.5–5.1)
Sodium: 137 mmol/L (ref 135–145)

## 2017-01-01 LAB — CBC
HCT: 31.1 % — ABNORMAL LOW (ref 39.0–52.0)
Hemoglobin: 10.5 g/dL — ABNORMAL LOW (ref 13.0–17.0)
MCH: 27.5 pg (ref 26.0–34.0)
MCHC: 33.8 g/dL (ref 30.0–36.0)
MCV: 81.4 fL (ref 78.0–100.0)
Platelets: 179 10*3/uL (ref 150–400)
RBC: 3.82 MIL/uL — ABNORMAL LOW (ref 4.22–5.81)
RDW: 14.2 % (ref 11.5–15.5)
WBC: 5.8 10*3/uL (ref 4.0–10.5)

## 2017-01-01 LAB — COXSACKIE A VIRUS ANTIBODIES
Coxsackie A16 IgG: 1:200 {titer} — ABNORMAL HIGH
Coxsackie A16 IgM: NEGATIVE titer
Coxsackie A24 IgG: 1:200 {titer} — ABNORMAL HIGH
Coxsackie A24 IgM: NEGATIVE titer
Coxsackie A7 IgG: 1:200 {titer} — ABNORMAL HIGH
Coxsackie A7 IgM: NEGATIVE titer
Coxsackie A9 IgG: 1:200 {titer} — ABNORMAL HIGH
Coxsackie A9 IgM: NEGATIVE titer

## 2017-01-01 LAB — GLUCOSE, CAPILLARY
Glucose-Capillary: 125 mg/dL — ABNORMAL HIGH (ref 65–99)
Glucose-Capillary: 112 mg/dL — ABNORMAL HIGH (ref 65–99)
Glucose-Capillary: 127 mg/dL — ABNORMAL HIGH (ref 65–99)

## 2017-01-01 MED ORDER — DOCUSATE SODIUM 100 MG PO CAPS
100.0000 mg | ORAL_CAPSULE | Freq: Two times a day (BID) | ORAL | Status: DC
  Administered 2017-01-02 – 2017-01-07 (×10): 100 mg via ORAL
  Filled 2017-01-01 (×10): qty 1

## 2017-01-01 MED ORDER — DOCUSATE SODIUM 100 MG PO CAPS
200.0000 mg | ORAL_CAPSULE | Freq: Once | ORAL | Status: AC
  Administered 2017-01-02: 200 mg via ORAL
  Filled 2017-01-01: qty 2

## 2017-01-01 MED ORDER — HYDRALAZINE HCL 25 MG PO TABS
25.0000 mg | ORAL_TABLET | Freq: Three times a day (TID) | ORAL | Status: DC
  Administered 2017-01-01 – 2017-01-07 (×14): 25 mg via ORAL
  Filled 2017-01-01 (×15): qty 1

## 2017-01-01 NOTE — Progress Notes (Signed)
[facility] Progress Note  Patient: James Burke GUY:[id number]   PCP: [name], MD DOB: 09-Oct-1965   DOA: 12/29/2016   DOS: 01/01/2017   Date of Service: the patient was seen and examined on 01/01/2017  Subjective: Continues to deny any shortness of breath, feeling better, no nausea no vomiting.  Brief hospital course: Pt. with PMH of  DM type II, Charcot's right foot, CHF class EF 35-40%, andCKD stage III; admitted on 12/29/2016, with complaint of shortnes of breath , was found to have acute on chronic combined CHF with pericardial effusion. Currently further plan is continue IV diuresis and arrange for HD.  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  Chest pain, Elevated troponin, Large pericardial effusion: Acute. 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 Posey Pronto.  Following up at [facility] also for possible transplant. Nephrology following.  Planning to arrange HD, ANCA elevated.   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 - Hypoglycemic protocols - hold glipizide - CBGs with sensitive SSI - Oxycodone when necessary pain   Hyperlipidemia:  Patient reports no longer being on atorvastatin.  BPH - Continue Flomax   Bowel regimen: last BM 12/31/2016 Diet: cardiac diet DVT Prophylaxis: subcutaneous Heparin  Advance goals of care discussion: full code  Family Communication: family was present at bedside, at the time of interview. The pt provided permission to discuss medical  plan with the family. Opportunity was given to ask question and all questions were answered satisfactorily.   Disposition:  Discharge to home. Expected discharge date: TBD  Consultants: cardiology, vascular surgery, nephrology Procedures: Echocardiogram.  Antibiotics: Anti-infectives    None       Objective: Physical Exam: Vitals:   12/31/16 1747 12/31/16 2007 01/01/17 0431 01/01/17 1208  BP: (!) 149/94 (!) 153/97 (!) 154/97 (!) 149/95  Pulse: 83 77 78 76  Resp: 18 18  20   Temp:  98.3 F (36.8 C) 98.2 F (36.8 C) 98.3 F (36.8 C)  TempSrc:  Oral Oral Oral  SpO2:  97% 95% 99%  Weight:   89.3 kg (196 lb 12.8 oz)   Height:        Intake/Output Summary (Last 24 hours) at 01/01/17 1838 Last data filed at 01/01/17 1531  Gross per 24 hour  Intake              864 ml  Output             2050 ml  Net            -1186 ml   Filed Weights   12/30/16 0003 12/31/16 0450 01/01/17 0431  Weight: 91.7 kg (202 lb 1.6 oz) 90.1 kg (198 lb 11.2 oz) 89.3 kg (196 lb 12.8 oz)   General: Alert, Awake and Oriented to Time, Place and Person. Appear in mild distress, affect appropriate Eyes: PERRL, Conjunctiva normal ENT: Oral Mucosa clear moist. Neck: no JVD, no Abnormal Mass Or lumps Cardiovascular: S1 and S2 Present, no Murmur, Respiratory: Bilateral Air entry equal and Decreased, no use of accessory muscle, Clear  to Auscultation, no Crackles, no wheezes Abdomen: Bowel Sound present, Soft and no tenderness Skin: no redness, no Rash, no induration Extremities: trace Pedal edema, no calf tenderness Neurologic: Grossly no focal neuro deficit. Bilaterally Equal motor strength  Data Reviewed: CBC:  Recent Labs Lab 12/30/16 0041 12/31/16 0241 01/01/17 0229  WBC 6.5 5.3 5.8  NEUTROABS 5.0  --   --   HGB 10.9* 9.4* 10.5*  HCT 32.1* 28.6* 31.1*  MCV 81.3 80.8 81.4  PLT 200 166 440   Basic Metabolic Panel:  Recent Labs Lab 12/30/16 0041 12/30/16 0545 12/30/16 0830  12/31/16 0241 01/01/17 0229  NA  --  138  --  136 137  K  --  3.6  --  3.5 3.5  CL  --  103  --  100* 101  CO2  --  24  --  24 24  GLUCOSE  --  85  --  97 120*  BUN  --  66*  --  72* 74*  CREATININE  --  6.05*  --  6.28* 6.30*  CALCIUM  --  8.9  --  8.6* 9.1  MG 2.1  --  2.2 2.1  --   PHOS  --   --  3.7 4.5 4.7*    Liver Function Tests:  Recent Labs Lab 12/30/16 1354 12/31/16 0241 01/01/17 0229  AST 13*  --   --   ALT 14*  --   --   ALKPHOS 47  --   --   BILITOT 0.5  --   --   PROT 6.3*  --   --   ALBUMIN 3.4* 3.1* 3.4*   No results for input(s): LIPASE, AMYLASE in the last 168 hours. No results for input(s): AMMONIA in the last 168 hours. Coagulation Profile: No results for input(s): INR, PROTIME in the last 168 hours. Cardiac Enzymes:  Recent Labs Lab 12/30/16 0041 12/30/16 0545 12/30/16 1354  TROPONINI 0.03* 0.04* 0.03*   BNP (last 3 results) No results for input(s): PROBNP in the last 8760 hours. CBG:  Recent Labs Lab 12/31/16 1131 12/31/16 1649 12/31/16 2120 01/01/17 1128 01/01/17 1640  GLUCAP 126* 115* 151* 125* 112*   Studies: No results found.  Scheduled Meds: . amLODipine  10 mg Oral Daily  . aspirin EC  81 mg Oral Daily  . carvedilol  25 mg Oral BID WC  . Dulaglutide  0.75 mg Subcutaneous Weekly  . furosemide  40 mg Intravenous Q12H  . hydrALAZINE  25 mg Oral TID  . insulin aspart  0-5 Units Subcutaneous QHS  . insulin aspart  0-9 Units Subcutaneous TID WC  . potassium chloride SA  20 mEq Oral Daily  . sodium chloride flush  3 mL Intravenous Q12H  . tamsulosin  0.4 mg Oral BID   Continuous Infusions: . sodium chloride     PRN Meds: sodium chloride, acetaminophen, ondansetron (ZOFRAN) IV, oxyCODONE-acetaminophen **AND** oxyCODONE, sodium chloride flush, zolpidem  Time spent: 30 minutes  Author: Berle Mull, MD [facility] Pager: [phone] 01/01/2017 6:38 PM  If 7PM-7AM, please contact night-coverage at [email or web address],  password [facility]

## 2017-01-01 NOTE — Progress Notes (Signed)
[facility] Progress Note    Assessment/ Plan:   AKI/progressive CKD-4: likely due to ischemic insult to his kidney from his pericardial effusion and worsening systolic heart failure. Renal ultrasound normal. Work-up negative thus far except for equivocal ANCA at 4.0. UPC 8.96. UOP -2 L. Net -630 cc in the last 24 and -2 L since admit. Serum creatinine stable at 6.3. BUN is stable at 74. No uremic symptoms.  Continue IV Lasix 40 mg twice a day  F/u remainder of autoimmune labs and coxsackie and rickettsial titers  Daily renal function  Vein mapping ordered 4/28. Plan to consult [facility] on Monday for [facility] and fistula if creatinine continues to worsen.  Severe pericardial effusion: CT chest on 4/26 at [facility] was read as moderately large pericardial effusion. ECHO 4/28 with severe pericardial effusion, consistent with pre-tamponade state.   May need to have pericardiocentesis. This could also help diagnostically and therpeutically. Discussed this with his cardiologist  Plan to repeat ECHO on Monday  Continue IV Lasix  Autoimmune and viral workup as above  HFrEF:  ECHO 4/28 with EF 15-20% and diffuse hypokinesis (35-40% wtih G1DD in 2016).   Plan per Cardiology  Lasix as above  Also on Coreg and Norvasc.  TTE as above  DM-2: well controlled.   Per primary  Subjective:    A little bit frustrated about his kidney issue but states feeling better. No chest pain and shortness of breath.   Objective:   BP (!) 154/97 (BP Location: Right Arm)   Pulse 78   Temp 98.2 F (36.8 C) (Oral)   Resp 18   Ht 6' (1.829 m)   Wt 196 lb 12.8 oz (89.3 kg) Comment: scale a  SpO2 95%   BMI 26.69 kg/m   Intake/Output Summary (Last 24 hours) at 01/01/17 0716 Last data filed at 01/01/17 0222  Gross per 24 hour  Intake             1320 ml  Output             1950 ml  Net             -630 ml   Weight change: -1 lb 14.4 oz (-0.862 kg)  Physical  Exam: GEN: sitting on bedside chair, in NAD. CVS: RRR, no rubs RESP: CTAB, normal work of breathing GI: +BS, soft, non-tender, non-distended MSK: Right lower extremity wasting with right foot deformity from Charcot's disease  Imaging: US Renal  Result Date: 12/30/2016 CLINICAL DATA:  Acute renal injury EXAM: RENAL / URINARY TRACT ULTRASOUND COMPLETE COMPARISON:  None. FINDINGS: Right Kidney: Length: 10.9 cm. Echogenicity within normal limits. No mass or hydronephrosis visualized. Left Kidney: Length: 10.7 cm. Echogenicity within normal limits. No mass or hydronephrosis visualized. Bladder: Decompressed. Small right-sided pleural effusion is noted. IMPRESSION: Kidneys are within normal limits. Small right-sided pleural effusion. Electronically Signed   By: Inez Catalina M.D.   On: 12/30/2016 12:19    Labs: BMET  Recent Labs Lab 12/30/16 0545 12/30/16 0830 12/31/16 0241 01/01/17 0229  NA 138  --  136 137  K 3.6  --  3.5 3.5  CL 103  --  100* 101  CO2 24  --  24 24  GLUCOSE 85  --  97 120*  BUN 66*  --  72* 74*  CREATININE 6.05*  --  6.28* 6.30*  CALCIUM 8.9  --  8.6* 9.1  PHOS  --  3.7 4.5 4.7*   CBC  Recent Labs Lab 12/30/16 0041 12/31/16 0241 01/01/17 0229  WBC 6.5 5.3 5.8  NEUTROABS 5.0  --   --   HGB 10.9* 9.4* 10.5*  HCT 32.1* 28.6* 31.1*  MCV 81.3 80.8 81.4  PLT 200 166 179    Medications:    . amLODipine  10 mg Oral Daily  . aspirin EC  81 mg Oral Daily  . carvedilol  25 mg Oral BID WC  . Dulaglutide  0.75 mg Subcutaneous Weekly  . furosemide  40 mg Intravenous Q12H  . insulin aspart  0-5 Units Subcutaneous QHS  . insulin aspart  0-9 Units Subcutaneous TID WC  . potassium chloride SA  20 mEq Oral Daily  . sodium chloride flush  3 mL Intravenous Q12H  . tamsulosin  0.4 mg Oral BID    Wendee Beavers, MD.  PGY-2 Pager [phone] 01/01/17  12:10 PM  I have seen and examined this patient and agree with plan and assessment in the above note with renal  recommendations/intervention highlighted.  Feels "great", however has a large pericardial effusion described as "pre-tamponade".  He has pulmonary HTN which thankfully is preventing that.  He is reluctant about starting dialysis because he feels so good.  Discussed case with Drs. Meda Coffee and Posey Pronto.  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 Broadus Jyren A Fatuma Dowers,MD 01/01/2017 1:03 PM

## 2017-01-01 NOTE — Consult Note (Signed)
ASSESSMENT & PLAN   AKI/ STAGE 4 CKD:  His vein mapping is pending. He is right-handed. He has an IV in his right arm. I will tentatively plan placement of a left arterial venous fistula or possible graft on Tuesday. If his renal function does not improve then we will place a tunneled dialysis catheter at that time. I will need nephrology's input on whether or not to place the catheter.  REASON FOR CONSULT:    Needs catheter and an AV fistula. Consult is from Dr. Marval Regal.   HPI:   James Burke is a 51 y.o. male who was admitted on 12/30/2016 with chest pain and shortness of breath. He was found to have a large pericardial effusion with combined systolic and diastolic congestive heart failure. This was acute on chronic. During this admission he had acute renal failure superimposed on stage IV chronic kidney disease. He is followed as an outpatient by Dr. Posey Pronto. Nephrology asked [place] to see the patient tomorrow to evaluate him for hemodialysis access. If his kidney function does not improve it would also like [place] to place a tunneled dialysis catheter.  History of shortness of breath has improved significantly since admission and he is being diuresed. He denies any other uremic symptoms. Specifically he denies nausea, vomiting, fatigue, or anorexia.  Past Medical History:  Diagnosis Date  . 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 C. difficile colitis 04/18/2008  . Congestive heart failure ([place])   . Diabetes mellitus   . DVT (deep venous thrombosis) ([facility])    right leg   . GERD (gastroesophageal reflux disease)   . Renal insufficiency     Family History  Problem Relation Age of Onset  . Heart disease      No family history  . Cancer Brother     SOCIAL HISTORY: Social History  Substance Use Topics  . Smoking status: Never Smoker  . Smokeless tobacco: Current User    Types: Chew  . Alcohol use No    Allergies  Allergen Reactions  . Methocarbamol Diarrhea  . Prednisone     Other  reaction(s): Other blindness  . Simvastatin Other (See Comments)    Joint aches.     Current Facility-Administered Medications  Medication Dose Route Frequency Provider Last Rate Last Dose  . 0.9 %  sodium chloride infusion  250 mL Intravenous PRN Norval Morton, MD      . acetaminophen (TYLENOL) tablet 650 mg  650 mg Oral Q4H PRN Rondell A Tamala Julian, MD      . amLODipine (NORVASC) tablet 10 mg  10 mg Oral Daily Dorothy Spark, MD   10 mg at 12/31/16 1742  . aspirin EC tablet 81 mg  81 mg Oral Daily Norval Morton, MD   81 mg at 01/01/17 0948  . carvedilol (COREG) tablet 25 mg  25 mg Oral BID WC Norval Morton, MD   25 mg at 01/01/17 0719  . Dulaglutide SOPN 0.75 mg  0.75 mg Subcutaneous Weekly Lavina Hamman, MD   0.75 mg at 12/31/16 1741  . furosemide (LASIX) injection 40 mg  40 mg Intravenous Q12H Norval Morton, MD   40 mg at 01/01/17 0720  . hydrALAZINE (APRESOLINE) tablet 25 mg  25 mg Oral TID Eileen Stanford, PA-C   25 mg at 01/01/17 0948  . insulin aspart (novoLOG) injection 0-5 Units  0-5 Units Subcutaneous QHS Rondell A Smith, MD      . insulin aspart (novoLOG)  injection 0-9 Units  0-9 Units Subcutaneous TID WC Norval Morton, MD   1 Units at 12/30/16 1748  . ondansetron (ZOFRAN) injection 4 mg  4 mg Intravenous Q6H PRN Norval Morton, MD      . oxyCODONE-acetaminophen (PERCOCET/ROXICET) 5-325 MG per tablet 1 tablet  1 tablet Oral Q4H PRN Norval Morton, MD   1 tablet at 01/01/17 0726   And  . oxyCODONE (Oxy IR/ROXICODONE) immediate release tablet 5 mg  5 mg Oral Q4H PRN Norval Morton, MD   5 mg at 01/01/17 0726  . potassium chloride SA (K-DUR,KLOR-CON) CR tablet 20 mEq  20 mEq Oral Daily Norval Morton, MD   20 mEq at 01/01/17 0948  . sodium chloride flush (NS) 0.9 % injection 3 mL  3 mL Intravenous Q12H Norval Morton, MD   3 mL at 01/01/17 0949  . sodium chloride flush (NS) 0.9 % injection 3 mL  3 mL Intravenous PRN Norval Morton, MD      . tamsulosin (FLOMAX)  capsule 0.4 mg  0.4 mg Oral BID Lavina Hamman, MD   0.4 mg at 01/01/17 6979  . zolpidem (AMBIEN) tablet 10 mg  10 mg Oral QHS PRN Norval Morton, MD   10 mg at 12/31/16 2130   REVIEW OF SYSTEMS:  [X]  denotes positive finding, [ ]  denotes negative finding Cardiac  Comments:  Chest pain or chest pressure: X resolved  Shortness of breath upon exertion: X improved  Short of breath when lying flat:    Irregular heart rhythm:        Vascular    Pain in calf, thigh, or hip brought on by ambulation:    Pain in feet at night that wakes you up from your sleep:     Blood clot in your veins:    Leg swelling:         Pulmonary    Oxygen at home:    Productive cough:     Wheezing:         Neurologic    Sudden weakness in arms or legs:     Sudden numbness in arms or legs:     Sudden onset of difficulty speaking or slurred speech:    Temporary loss of vision in one eye:     Problems with dizziness:         Gastrointestinal    Blood in stool:     Vomited blood:         Genitourinary    Burning when urinating:     Blood in urine:        Psychiatric    Major depression:         Hematologic    Bleeding problems:    Problems with blood clotting too easily:        Skin    Rashes or ulcers:        Constitutional    Fever or chills:    -  PHYSICAL EXAM:   Vitals:   12/31/16 1747 12/31/16 2007 01/01/17 0431 01/01/17 1208  BP: (!) 149/94 (!) 153/97 (!) 154/97 (!) 149/95  Pulse: 83 77 78 76  Resp: 18 18  20   Temp:  98.3 F (36.8 C) 98.2 F (36.8 C) 98.3 F (36.8 C)  TempSrc:  Oral Oral Oral  SpO2:  97% 95% 99%  Weight:   196 lb 12.8 oz (89.3 kg)   Height:       Body mass index  is 26.69 kg/m. GENERAL: The patient is a well-nourished male, in no acute distress. The vital signs are documented above. CARDIAC: There is a regular rate and rhythm.  VASCULAR: I do not detect carotid bruits. He has palpable brachial and radial pulses bilaterally. He has no significant lower  extremity swelling. PULMONARY: There is good air exchange bilaterally without wheezing or rales. ABDOMEN: Soft and non-tender with normal pitched bowel sounds.  MUSCULOSKELETAL: He has a Charcot joint of the right ankle. NEUROLOGIC: No focal weakness or paresthesias are detected. SKIN: There are no ulcers or rashes noted. PSYCHIATRIC: The patient has a normal affect.  DATA:    Lab Results  Component Value Date   WBC 5.8 01/01/2017   HGB 10.5 (L) 01/01/2017   HCT 31.1 (L) 01/01/2017   MCV 81.4 01/01/2017   PLT 179 01/01/2017   Lab Results  Component Value Date   NA 137 01/01/2017   K 3.5 01/01/2017   CL 101 01/01/2017   CO2 24 01/01/2017   Lab Results  Component Value Date   CREATININE 6.30 (H) 01/01/2017   No results found for: INR, PROTIME Lab Results  Component Value Date   HGBA1C 5.2 10/26/2016   CBG (last 3)   Recent Labs  12/31/16 1649 12/31/16 2120 01/01/17 1128  GLUCAP 115* 151* 125*   BILATERAL UPPER EXTREMITY VEIN MAP: This has been ordered but is pending.  Deitra Mayo [facility] [place]: [place] Office: [phone]

## 2017-01-01 NOTE — Progress Notes (Addendum)
Patient Name: James Burke Date of Encounter: 01/01/2017  Principal Problem:   Acute on chronic combined systolic and diastolic congestive heart failure ([place]) Active Problems:   Essential hypertension, benign   Charcot foot due to diabetes mellitus ([place])   Acute renal failure superimposed on chronic kidney disease (HCC)   BPH (benign prostatic hyperplasia)   AKI (acute kidney injury) ([place])   Hypertensive heart and chronic kidney disease with heart failure and stage 1 through stage 4 chronic kidney disease, or chronic kidney disease ([place])   ESRD (end stage renal disease) ([place])   Length of Stay: 3  SUBJECTIVE  The patient denies dizziness or CP, SOB has improved.  CURRENT MEDS . amLODipine  10 mg Oral Daily  . aspirin EC  81 mg Oral Daily  . carvedilol  25 mg Oral BID WC  . Dulaglutide  0.75 mg Subcutaneous Weekly  . furosemide  40 mg Intravenous Q12H  . hydrALAZINE  25 mg Oral TID  . insulin aspart  0-5 Units Subcutaneous QHS  . insulin aspart  0-9 Units Subcutaneous TID WC  . potassium chloride SA  20 mEq Oral Daily  . sodium chloride flush  3 mL Intravenous Q12H  . tamsulosin  0.4 mg Oral BID   OBJECTIVE  Vitals:   12/31/16 1213 12/31/16 1747 12/31/16 2007 01/01/17 0431  BP: (!) 154/97 (!) 149/94 (!) 153/97 (!) 154/97  Pulse: 74 83 77 78  Resp: 20 18 18    Temp: 98 F (36.7 C)  98.3 F (36.8 C) 98.2 F (36.8 C)  TempSrc: Oral  Oral Oral  SpO2: 99%  97% 95%  Weight:    196 lb 12.8 oz (89.3 kg)  Height:        Intake/Output Summary (Last 24 hours) at 01/01/17 0909 Last data filed at 01/01/17 0908  Gross per 24 hour  Intake             1680 ml  Output             1950 ml  Net             -270 ml   Filed Weights   12/30/16 0003 12/31/16 0450 01/01/17 0431  Weight: 202 lb 1.6 oz (91.7 kg) 198 lb 11.2 oz (90.1 kg) 196 lb 12.8 oz (89.3 kg)   PHYSICAL EXAM  General: Pleasant, NAD. Neuro: Alert and oriented X 3. Moves all extremities  spontaneously. Psych: Normal affect. HEENT:  Normal  Neck: Supple without bruits or JVD. Lungs:  Resp regular and unlabored, CTA. Heart: muffled heart sounds,RRR no s3, present s4, 2/6 systolic murmurs. Abdomen: Soft, non-tender, non-distended, BS + x 4.  Extremities: No clubbing, cyanosis or edema. DP/PT/Radials 2+ and equal bilaterally.  Accessory Clinical Findings  CBC  Recent Labs  12/30/16 0041 12/31/16 0241 01/01/17 0229  WBC 6.5 5.3 5.8  NEUTROABS 5.0  --   --   HGB 10.9* 9.4* 10.5*  HCT 32.1* 28.6* 31.1*  MCV 81.3 80.8 81.4  PLT 200 166 102   Basic Metabolic Panel  Recent Labs  12/30/16 0830 12/31/16 0241 01/01/17 0229  NA  --  136 137  K  --  3.5 3.5  CL  --  100* 101  CO2  --  24 24  GLUCOSE  --  97 120*  BUN  --  72* 74*  CREATININE  --  6.28* 6.30*  CALCIUM  --  8.6* 9.1  MG 2.2 2.1  --   PHOS  3.7 4.5 4.7*   Liver Function Tests  Recent Labs  12/30/16 1354 12/31/16 0241 01/01/17 0229  AST 13*  --   --   ALT 14*  --   --   ALKPHOS 47  --   --   BILITOT 0.5  --   --   PROT 6.3*  --   --   ALBUMIN 3.4* 3.1* 3.4*   No results for input(s): LIPASE, AMYLASE in the last 72 hours. Cardiac Enzymes  Recent Labs  12/30/16 0041 12/30/16 0545 12/30/16 1354  TROPONINI 0.03* 0.04* 0.03*    Recent Labs  12/30/16 0545  CHOL 107  HDL 24*  LDLCALC 63  TRIG 100  CHOLHDL 4.5   Thyroid Function Tests  Recent Labs  12/30/16 1354  TSH 2.218    Radiology/Studies  US Renal  Result Date: 12/30/2016 CLINICAL DATA:  Acute renal injury EXAM: RENAL / URINARY TRACT ULTRASOUND COMPLETE COMPARISON:  None. FINDINGS: Right Kidney: Length: 10.9 cm. Echogenicity within normal limits. No mass or hydronephrosis visualized. Left Kidney: Length: 10.7 cm. Echogenicity within normal limits. No mass or hydronephrosis visualized. Bladder: Decompressed. Small right-sided pleural effusion is noted. IMPRESSION: Kidneys are within normal limits. Small right-sided  pleural effusion. Electronically Signed   By: Inez Catalina M.D.   On: 12/30/2016 12:19    TELE: SR, personally reviewed   ASSESSMENT AND PLAN   1. Acute on chronic systolic and diastolic heart failure 2. Pericardial effusion 3. CKD stage IV 4. DM 5. HLD  51 yo male with DM2, chronic systolic and diastolic heart failure, CKD stage IV, HTN, and elevated triglycerides.  He presented to [facility]  12/29/16 with shortness of breath, leg swelling, and chest discomfort worse with inspiration.  He was transferred to [facility] for further management. He was started on iv lasix with good symptomatic response but still has signs of fluid overload with S4 and mild basilar crackles. I would continue iv lasix for now and we will reevaluate in the morning. The problem is Crea 6.0, previously around 3, nephrology is following, he is not on HD yet. BNP 2700. K 3.6, repeat in the am. He has h/o moderate pericardial effusion on echo in 2016, we are awaiting repeat echo results. He had a stress test in 2016 consistent with non-ischemic cardiomyopathy.  He is on max dose of carvedilol and amlodipine 5 mg po daily, he is still hypertensive, we will add amlodipine to 10 mg po daily.   I have personally reviewed his echocardiogram that shows:  LVEF is severely decreased with moderate LVH and diffuse hypokinesis. RVEF is moderately decreased. There is severe pulmonary hypertension.  There is severe pericardial effusion with invagination of the RV and RA but good RV filling in diastole. This is consistent with pre-tamponade state. The patient has severe pulmonary hypertension and no signs of tamponade.   No urgent pericardiocentesis is necessary. Nephrology is considering early hemodialysis considering worsening Crea, vein mapping ordered. We will repeat limited echocardiogram for pericardial effusion on Monday.   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    Signed, Ena Dawley MD, [facility] 01/01/2017

## 2017-01-01 NOTE — Progress Notes (Signed)
Pharmacist Heart Failure Core Measure Documentation  Assessment: James Burke has an EF documented as 15-20% on 12/31/16 by ECHO.  Rationale: Heart failure patients with left ventricular systolic dysfunction (LVSD) and an EF < 40% should be prescribed an angiotensin converting enzyme inhibitor (ACEI) or angiotensin receptor blocker (ARB) at discharge unless a contraindication is documented in the medical record.  This patient is not currently on an ACEI or ARB for HF.  This note is being placed in the record in order to provide documentation that a contraindication to the use of these agents is present for this encounter.  ACE Inhibitor or Angiotensin Receptor Blocker is contraindicated (specify all that apply)  []   ACEI allergy AND ARB allergy []   Angioedema []   Moderate or severe aortic stenosis []   Hyperkalemia []   Hypotension []   Renal artery stenosis [x]   Worsening renal function, preexisting renal disease or dysfunction   [name] 01/01/2017 2:37 PM

## 2017-01-02 ENCOUNTER — Inpatient Hospital Stay (HOSPITAL_COMMUNITY): Payer: BLUE CROSS/BLUE SHIELD

## 2017-01-02 DIAGNOSIS — I319 Disease of pericardium, unspecified: Secondary | ICD-10-CM

## 2017-01-02 DIAGNOSIS — N186 End stage renal disease: Secondary | ICD-10-CM

## 2017-01-02 LAB — CBC
HCT: 30.6 % — ABNORMAL LOW (ref 39.0–52.0)
Hemoglobin: 10.1 g/dL — ABNORMAL LOW (ref 13.0–17.0)
MCH: 26.7 pg (ref 26.0–34.0)
MCHC: 33 g/dL (ref 30.0–36.0)
MCV: 81 fL (ref 78.0–100.0)
Platelets: 173 10*3/uL (ref 150–400)
RBC: 3.78 MIL/uL — ABNORMAL LOW (ref 4.22–5.81)
RDW: 13.9 % (ref 11.5–15.5)
WBC: 5.4 10*3/uL (ref 4.0–10.5)

## 2017-01-02 LAB — RENAL FUNCTION PANEL
Albumin: 3.2 g/dL — ABNORMAL LOW (ref 3.5–5.0)
Anion gap: 14 (ref 5–15)
BUN: 78 mg/dL — ABNORMAL HIGH (ref 6–20)
CO2: 26 mmol/L (ref 22–32)
Calcium: 8.9 mg/dL (ref 8.9–10.3)
Chloride: 98 mmol/L — ABNORMAL LOW (ref 101–111)
Creatinine, Ser: 6.87 mg/dL — ABNORMAL HIGH (ref 0.61–1.24)
GFR calc Af Amer: 10 mL/min — ABNORMAL LOW (ref >60)
GFR calc non Af Amer: 8 mL/min — ABNORMAL LOW (ref >60)
Glucose, Bld: 96 mg/dL (ref 65–99)
Phosphorus: 5 mg/dL — ABNORMAL HIGH (ref 2.5–4.6)
Potassium: 3.5 mmol/L (ref 3.5–5.1)
Sodium: 138 mmol/L (ref 135–145)

## 2017-01-02 LAB — GLUCOSE, CAPILLARY
Glucose-Capillary: 100 mg/dL — ABNORMAL HIGH (ref 65–99)
Glucose-Capillary: 102 mg/dL — ABNORMAL HIGH (ref 65–99)
Glucose-Capillary: 154 mg/dL — ABNORMAL HIGH (ref 65–99)
Glucose-Capillary: 135 mg/dL — ABNORMAL HIGH (ref 65–99)
Glucose-Capillary: 146 mg/dL — ABNORMAL HIGH (ref 65–99)

## 2017-01-02 LAB — SURGICAL PCR SCREEN
MRSA, PCR: NEGATIVE
Staphylococcus aureus: POSITIVE — AB

## 2017-01-02 LAB — ECHOCARDIOGRAM LIMITED
Height: 72 in
Weight: 3163.2 oz

## 2017-01-02 LAB — ANTINUCLEAR ANTIBODIES, IFA: ANA Ab, IFA: NEGATIVE

## 2017-01-02 MED ORDER — CEFAZOLIN SODIUM-DEXTROSE 1-4 GM/50ML-% IV SOLN: 1.0000 g | INTRAVENOUS | Status: DC

## 2017-01-02 MED ORDER — AMLODIPINE BESYLATE 10 MG PO TABS
10.0000 mg | ORAL_TABLET | Freq: Every day | ORAL | Status: DC
  Administered 2017-01-02 – 2017-01-07 (×4): 10 mg via ORAL
  Filled 2017-01-02 (×4): qty 1

## 2017-01-02 MED ORDER — FUROSEMIDE 10 MG/ML IJ SOLN
40.0000 mg | Freq: Every day | INTRAMUSCULAR | Status: DC
  Administered 2017-01-04 – 2017-01-06 (×3): 40 mg via INTRAVENOUS
  Filled 2017-01-02 (×3): qty 4

## 2017-01-02 MED ORDER — CEFAZOLIN SODIUM-DEXTROSE 2-4 GM/100ML-% IV SOLN
2.0000 g | INTRAVENOUS | Status: DC
  Filled 2017-01-02: qty 100

## 2017-01-02 NOTE — Progress Notes (Signed)
   VASCULAR SURGERY ASSESSMENT & PLAN:   AKI/ STAGE 4 CKD:  His vein map shows reasonable veins in the left arm. He is right-handed. He has an IV in his right arm. Will proceed with left AVF/ AVG tomorrow. If his renal function does not improve then we will place a tunneled dialysis catheter at that time. I will need nephrology's input on whether or not to place the catheter.  SUBJECTIVE:   No complaints  PHYSICAL EXAM:   Vitals:   01/01/17 1208 01/01/17 2048 01/02/17 0514 01/02/17 1220  BP: (!) 149/95 (!) 155/100 (!) 150/90 (!) 150/92  Pulse: 76 72 80 80  Resp: 20 20 20 18   Temp: 98.3 F (36.8 C) 97.8 F (36.6 C) 98.4 F (36.9 C) 97.7 F (36.5 C)  TempSrc: Oral Oral Oral Oral  SpO2: 99% 98% 97% 99%  Weight:   197 lb 11.2 oz (89.7 kg)   Height:       Palpable left radial pulse.   LABS:   Lab Results  Component Value Date   WBC 5.4 01/02/2017   HGB 10.1 (L) 01/02/2017   HCT 30.6 (L) 01/02/2017   MCV 81.0 01/02/2017   PLT 173 01/02/2017   Lab Results  Component Value Date   CREATININE 6.87 (H) 01/02/2017   No results found for: INR, PROTIME CBG (last 3)   Recent Labs  01/01/17 2116 01/02/17 0722 01/02/17 1119  GLUCAP 127* 102* 154*    PROBLEM LIST:    Principal Problem:   Acute on chronic combined systolic and diastolic congestive heart failure (HCC) Active Problems:   Essential hypertension, benign   Charcot foot due to diabetes mellitus (HCC)   Acute renal failure superimposed on chronic kidney disease (HCC)   BPH (benign prostatic hyperplasia)   AKI (acute kidney injury) ([place])   Hypertensive heart and chronic kidney disease with heart failure and stage 1 through stage 4 chronic kidney disease, or chronic kidney disease ([place])   ESRD (end stage renal disease) ([place])   CURRENT MEDS:   . amLODipine  10 mg Oral Daily  . aspirin EC  81 mg Oral Daily  . carvedilol  25 mg Oral BID WC  . docusate sodium  100 mg Oral BID  . Dulaglutide  0.75 mg  Subcutaneous Weekly  . [START ON 01/03/2017] furosemide  40 mg Intravenous Daily  . hydrALAZINE  25 mg Oral TID  . insulin aspart  0-5 Units Subcutaneous QHS  . insulin aspart  0-9 Units Subcutaneous TID WC  . potassium chloride SA  20 mEq Oral Daily  . sodium chloride flush  3 mL Intravenous Q12H  . tamsulosin  0.4 mg Oral BID    Gae Gallop Beeper: [phone] Office: [phone] 01/02/2017

## 2017-01-02 NOTE — Progress Notes (Signed)
Daune Perch, NP paged and aware that 2D echo today has resulted.

## 2017-01-02 NOTE — Progress Notes (Signed)
[facility] Progress Note  Patient: James Burke RKY:[id number]   PCP: [name], MD DOB: 14-May-1966   DOA: 12/29/2016   DOS: 01/02/2017   Date of Service: the patient was seen and examined on 01/02/2017  Subjective: feeling better, no nausea or vomiting. Echocardiogram that was ordered on 4/28 to be done today somehow got cancelled, replaced order.   Brief hospital course: Pt. with PMH of  DM type II, Charcot's right foot, CHF class EF 35-40%, andCKD stage III; admitted on 12/29/2016, with complaint of shortnes of breath , was found to have acute on chronic combined CHF with pericardial effusion. Currently further plan is follow up on Echocardiogram and arrange for HD.  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 - No ACE-I 2/2 renal insufficiency  Chest pain, Elevated troponin, Large pericardial effusion: Acute. 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 Posey Pronto.  Following up at [facility] also for possible transplant. Nephrology following.  Planning to arrange HD, ANCA elevated.   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 - Hypoglycemic protocols - hold glipizide - CBGs with sensitive SSI - Oxycodone when necessary pain   Hyperlipidemia:  Patient reports no longer being on atorvastatin.  BPH - Continue Flomax   Bowel regimen: last BM 12/31/2016 Diet: cardiac diet DVT Prophylaxis: subcutaneous Heparin  Advance goals of care discussion: full code  Family Communication: no family was  present at bedside, at the time of interview.   Disposition:  Discharge to home. Expected discharge date: TBD  Consultants: cardiology, vascular surgery, nephrology Procedures: Echocardiogram.  Antibiotics: Anti-infectives    None       Objective: Physical Exam: Vitals:   01/01/17 1208 01/01/17 2048 01/02/17 0514 01/02/17 1220  BP: (!) 149/95 (!) 155/100 (!) 150/90 (!) 150/92  Pulse: 76 72 80 80  Resp: 20 20 20 18   Temp: 98.3 F (36.8 C) 97.8 F (36.6 C) 98.4 F (36.9 C) 97.7 F (36.5 C)  TempSrc: Oral Oral Oral Oral  SpO2: 99% 98% 97% 99%  Weight:   89.7 kg (197 lb 11.2 oz)   Height:        Intake/Output Summary (Last 24 hours) at 01/02/17 1259 Last data filed at 01/02/17 0949  Gross per 24 hour  Intake              847 ml  Output             1900 ml  Net            -1053 ml   Filed Weights   12/31/16 0450 01/01/17 0431 01/02/17 0514  Weight: 90.1 kg (198 lb 11.2 oz) 89.3 kg (196 lb 12.8 oz) 89.7 kg (197 lb 11.2 oz)   General: Alert, Awake and Oriented to Time, Place and Person. Appear in mild distress, affect appropriate Eyes: PERRL, Conjunctiva normal ENT: Oral Mucosa clear moist. Neck: no JVD, no Abnormal Mass Or lumps Cardiovascular: S1 and S2 Present, no Murmur, Respiratory: Bilateral Air entry equal and Decreased, no use of accessory muscle, Clear to Auscultation, no Crackles, no wheezes Abdomen:  Bowel Sound present, Soft and no tenderness Skin: no redness, no Rash, no induration Extremities: trace Pedal edema, no calf tenderness Neurologic: Grossly no focal neuro deficit. Bilaterally Equal motor strength  Data Reviewed: CBC:  Recent Labs Lab 12/30/16 0041 12/31/16 0241 01/01/17 0229 01/02/17 0314  WBC 6.5 5.3 5.8 5.4  NEUTROABS 5.0  --   --   --   HGB 10.9* 9.4* 10.5* 10.1*  HCT 32.1* 28.6* 31.1* 30.6*  MCV 81.3 80.8 81.4 81.0  PLT 200 166 179 939   Basic Metabolic Panel:  Recent Labs Lab 12/30/16 0041 12/30/16 0545 12/30/16 0830  12/31/16 0241 01/01/17 0229 01/02/17 0314  NA  --  138  --  136 137 138  K  --  3.6  --  3.5 3.5 3.5  CL  --  103  --  100* 101 98*  CO2  --  24  --  24 24 26   GLUCOSE  --  85  --  97 120* 96  BUN  --  66*  --  72* 74* 78*  CREATININE  --  6.05*  --  6.28* 6.30* 6.87*  CALCIUM  --  8.9  --  8.6* 9.1 8.9  MG 2.1  --  2.2 2.1  --   --   PHOS  --   --  3.7 4.5 4.7* 5.0*    Liver Function Tests:  Recent Labs Lab 12/30/16 1354 12/31/16 0241 01/01/17 0229 01/02/17 0314  AST 13*  --   --   --   ALT 14*  --   --   --   ALKPHOS 47  --   --   --   BILITOT 0.5  --   --   --   PROT 6.3*  --   --   --   ALBUMIN 3.4* 3.1* 3.4* 3.2*   No results for input(s): LIPASE, AMYLASE in the last 168 hours. No results for input(s): AMMONIA in the last 168 hours. Coagulation Profile: No results for input(s): INR, PROTIME in the last 168 hours. Cardiac Enzymes:  Recent Labs Lab 12/30/16 0041 12/30/16 0545 12/30/16 1354  TROPONINI 0.03* 0.04* 0.03*   BNP (last 3 results) No results for input(s): PROBNP in the last 8760 hours. CBG:  Recent Labs Lab 01/01/17 1128 01/01/17 1640 01/01/17 2116 01/02/17 0722 01/02/17 1119  GLUCAP 125* 112* 127* 102* 154*   Studies: No results found.  Scheduled Meds: . amLODipine  10 mg Oral Daily  . aspirin EC  81 mg Oral Daily  . carvedilol  25 mg Oral BID WC  . docusate sodium  100 mg Oral BID  . Dulaglutide  0.75 mg Subcutaneous Weekly  . [START ON 01/03/2017] furosemide  40 mg Intravenous Daily  . hydrALAZINE  25 mg Oral TID  . insulin aspart  0-5 Units Subcutaneous QHS  . insulin aspart  0-9 Units Subcutaneous TID WC  . potassium chloride SA  20 mEq Oral Daily  . sodium chloride flush  3 mL Intravenous Q12H  . tamsulosin  0.4 mg Oral BID   Continuous Infusions: . sodium chloride     PRN Meds: sodium chloride, acetaminophen, ondansetron (ZOFRAN) IV, oxyCODONE-acetaminophen **AND** oxyCODONE, sodium chloride flush, zolpidem  Time spent:  30 minutes  Author: Berle Mull, MD [facility] Pager: [phone] 01/02/2017 12:59 PM  If 7PM-7AM, please contact night-coverage at [email or web address], password [facility]

## 2017-01-02 NOTE — Progress Notes (Signed)
[facility] Progress Note    Assessment/ Plan:   AKI/progressive CKD-4: likely due to ischemic insult to his kidney from his pericardial effusion and worsening systolic heart failure. Renal ultrasound normal. Work-up negative thus far except for equivocal ANCA at 4.0. UPC 8.96. UOP -2 L. Net -1.3/24hrs. -3.2 L since admit. Serum creatinine went up from 6.3 to 6.87. BUN is 74-->78. No uremic symptoms. Reports feeling great. Likes to go home.   We will back off on IV Lasix to 40 mg daily. Has already gotten his morning dose. May consider starting by mouth Lasix  Had vein mapping this morning. Would hold off tunneled HD catheter at least until we reassess him tomorrow. Left atrial venous fistula or graft tentatively scheduled for 5/1 by [facility]  Autoimmune labs not revealing so far  Daily renal function  Severe pericardial effusion: CT chest on 4/[facility] at [facility] was read as moderately large pericardial effusion. ECHO 4/28 with severe pericardial effusion, consistent with pre-tamponade state.   May need to have pericardiocentesis. This could also help diagnostically and therpeutically.   Repeat echo today  Lasix as above  HFrEF:  ECHO 4/28 with EF 15-20% and diffuse hypokinesis (35-40% wtih G1DD in 2016). No cardiopulmonary symptoms this morning.   Plan per Cardiology  Lasix as above  Also on Coreg  TTE as above  DM-2: well controlled.   Per primary  Subjective:    No chest pain and shortness of breath. Likes to go home from the day of admission but understands why that is not the case.    Objective:   BP (!) 150/90 (BP Location: Right Arm)   Pulse 80   Temp 98.4 F (36.9 C) (Oral)   Resp 20   Ht 6' (1.829 m)   Wt 197 lb 11.2 oz (89.7 kg) Comment: scale a  SpO2 97%   BMI 26.81 kg/m   Intake/Output Summary (Last 24 hours) at 01/02/17 1129 Last data filed at 01/02/17 0949  Gross per 24 hour  Intake              847 ml  Output              1900 ml  Net            -1053 ml   Weight change: 14.4 oz (0.408 kg)  Physical Exam: GEN: Lying on the bed, in NAD. CVS: RRR, no rubs RESP: CTAB, normal work of breathing GI: +BS, soft, non-tender, non-distended MSK: Right lower extremity wasting with right foot deformity from Charcot's disease  Imaging: No results found.  Labs: BMET  Recent Labs Lab 12/30/16 0545 12/30/16 0830 12/31/16 0241 01/01/17 0229 01/02/17 0314  NA 138  --  136 137 138  K 3.6  --  3.5 3.5 3.5  CL 103  --  100* 101 98*  CO2 24  --  24 24 26   GLUCOSE 85  --  97 120* 96  BUN 66*  --  72* 74* 78*  CREATININE 6.05*  --  6.28* 6.30* 6.87*  CALCIUM 8.9  --  8.6* 9.1 8.9  PHOS  --  3.7 4.5 4.7* 5.0*   CBC  Recent Labs Lab 12/30/16 0041 12/31/16 0241 01/01/17 0229 01/02/17 0314  WBC 6.5 5.3 5.8 5.4  NEUTROABS 5.0  --   --   --   HGB 10.9* 9.4* 10.5* 10.1*  HCT 32.1* 28.6* 31.1* 30.6*  MCV 81.3 80.8 81.4 81.0  PLT 200 166 179 173  Medications:    . amLODipine  10 mg Oral Daily  . aspirin EC  81 mg Oral Daily  . carvedilol  25 mg Oral BID WC  . docusate sodium  100 mg Oral BID  . Dulaglutide  0.75 mg Subcutaneous Weekly  . [START ON 01/03/2017] furosemide  40 mg Intravenous Daily  . hydrALAZINE  25 mg Oral TID  . insulin aspart  0-5 Units Subcutaneous QHS  . insulin aspart  0-9 Units Subcutaneous TID WC  . potassium chloride SA  20 mEq Oral Daily  . sodium chloride flush  3 mL Intravenous Q12H  . tamsulosin  0.4 mg Oral BID    Wendee Beavers, MD.  PGY-2 Pager [phone] 01/02/17  11:29 AM

## 2017-01-02 NOTE — Progress Notes (Signed)
Progress Note  Patient Name: James Burke Date of Encounter: 01/02/2017  Primary Cardiologist: Dr. Stanford Breed  Subjective   Patient feels significantly better than on admission with no chest discomfort, shortness of breath or orthpnea.   Inpatient Medications    Scheduled Meds: . amLODipine  10 mg Oral Daily  . aspirin EC  81 mg Oral Daily  . carvedilol  25 mg Oral BID WC  . docusate sodium  100 mg Oral BID  . Dulaglutide  0.75 mg Subcutaneous Weekly  . [START ON 01/03/2017] furosemide  40 mg Intravenous Daily  . hydrALAZINE  25 mg Oral TID  . insulin aspart  0-5 Units Subcutaneous QHS  . insulin aspart  0-9 Units Subcutaneous TID WC  . potassium chloride SA  20 mEq Oral Daily  . sodium chloride flush  3 mL Intravenous Q12H  . tamsulosin  0.4 mg Oral BID   Continuous Infusions: . sodium chloride     PRN Meds: sodium chloride, acetaminophen, ondansetron (ZOFRAN) IV, oxyCODONE-acetaminophen **AND** oxyCODONE, sodium chloride flush, zolpidem   Vital Signs    Vitals:   01/01/17 0431 01/01/17 1208 01/01/17 2048 01/02/17 0514  BP: (!) 154/97 (!) 149/95 (!) 155/100 (!) 150/90  Pulse: 78 76 72 80  Resp:  20 20 20   Temp: 98.2 F (36.8 C) 98.3 F (36.8 C) 97.8 F (36.6 C) 98.4 F (36.9 C)  TempSrc: Oral Oral Oral Oral  SpO2: 95% 99% 98% 97%  Weight: 196 lb 12.8 oz (89.3 kg)   197 lb 11.2 oz (89.7 kg)  Height:        Intake/Output Summary (Last 24 hours) at 01/02/17 1043 Last data filed at 01/02/17 0949  Gross per 24 hour  Intake              847 ml  Output             2850 ml  Net            -2003 ml   Filed Weights   12/31/16 0450 01/01/17 0431 01/02/17 0514  Weight: 198 lb 11.2 oz (90.1 kg) 196 lb 12.8 oz (89.3 kg) 197 lb 11.2 oz (89.7 kg)    Telemetry    NSR - Personally Reviewed  ECG   No new tracing  Physical Exam   GEN: No acute distress.   Neck: No JVD Cardiac: RRR, no murmurs, rubs, or gallops.  Respiratory: Clear to auscultation  bilaterally. GI: Soft, nontender, non-distended  MS: No edema; Firm splint on right lower leg. Neuro:  Nonfocal  Psych: Normal affect   Labs    Chemistry Recent Labs Lab 12/30/16 1354 12/31/16 0241 01/01/17 0229 01/02/17 0314  NA  --  136 137 138  K  --  3.5 3.5 3.5  CL  --  100* 101 98*  CO2  --  24 24 26   GLUCOSE  --  97 120* 96  BUN  --  72* 74* 78*  CREATININE  --  6.28* 6.30* 6.87*  CALCIUM  --  8.6* 9.1 8.9  PROT 6.3*  --   --   --   ALBUMIN 3.4* 3.1* 3.4* 3.2*  AST 13*  --   --   --   ALT 14*  --   --   --   ALKPHOS 47  --   --   --   BILITOT 0.5  --   --   --   GFRNONAA  --  9* 9* 8*  GFRAA  --  11* 11* 10*  ANIONGAP  --  12 12 14      Hematology Recent Labs Lab 12/31/16 0241 01/01/17 0229 01/02/17 0314  WBC 5.3 5.8 5.4  RBC 3.54* 3.82* 3.78*  HGB 9.4* 10.5* 10.1*  HCT 28.6* 31.1* 30.6*  MCV 80.8 81.4 81.0  MCH 26.6 27.5 26.7  MCHC 32.9 33.8 33.0  RDW 13.8 14.2 13.9  PLT 166 179 173    Cardiac Enzymes Recent Labs Lab 12/30/16 0041 12/30/16 0545 12/30/16 1354  TROPONINI 0.03* 0.04* 0.03*   No results for input(s): TROPIPOC in the last 168 hours.   BNP Recent Labs Lab 12/30/16 0042  BNP 2,695.8*     DDimer No results for input(s): DDIMER in the last 168 hours.   Radiology    No results found.  Cardiac Studies   Echo 12/31/16 Study Conclusions   - Left ventricle: The cavity size was severely dilated. Wall   thickness was increased in a pattern of severe LVH. There was   moderate concentric hypertrophy. Systolic function was normal.   The estimated ejection fraction was in the range of 15% to 20%.   Diffuse hypokinesis. Features are consistent with a pseudonormal   left ventricular filling pattern, with concomitant abnormal   relaxation and increased filling pressure (grade 2 diastolic   dysfunction). Doppler parameters are consistent with elevated   ventricular end-diastolic filling pressure. - Aorta: The aorta was normal, not  dilated, and non-diseased. - Mitral valve: There was mild regurgitation. Valve area by   pressure half-time: 1.26 cm^2. - Left atrium: The atrium was moderately to severely dilated. - Right ventricle: Systolic function was moderately reduced. - Tricuspid valve: There was mild regurgitation. - Pulmonic valve: There was trivial regurgitation. - Pulmonary arteries: Systolic pressure was severely increased. PA   peak pressure: 71 mm Hg (S). - Inferior vena cava: The vessel was dilated. The respirophasic   diameter changes were blunted (< 50%), consistent with elevated   central venous pressure. - Pericardium, extracardiac: There was severe pericardial effusion.  Impressions:  - LVEF is severely decreased with moderate LVH and diffuse   hypokinesis.   RVEF is moderately decreased. There is severe pulmonary   hypertension.     There is severe pericardial effusion with invagination of the RV   and RA but good RV filling in diastole.   This is consistent with pre-tamponade state. The patient has   severe pulmonary hypertension and no signs of tamponade.     No urgent pericardiocentesis is necessary. Nephrology is   considering early hemodialysis considering worsening Crea. We   will repeat echocardiogram on Monday.     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  Patient Profile     51 y.o. male  with DM2, chronic systolic and diastolic heart failure, CKD stage IV, HTN, and elevated triglycerides. He presented to [facility] 12/29/16 with shortness of breath, leg swelling, and chest discomfort worse with inspiration. He was transferred to [facility] for further management.   Assessment & Plan     1. Acute on chronic systolic and diastolic heart failure -BNP 2700.  -CXR 12/29/16: Enlarged cardiopericardial silhouette. No focal infiltrate or pulmonary edema. No pneumothorax or pleural effusion -CT chest ([facility]) 12/29/16:Moderately large pericardial  effusion similar to the prior study. Small bilateral pleural effusions. These are slightly larger than on the prior study. -Echo on 4/28 showed severely dilated LV, EF 15-20%, diffuse hypokinesis, grade 2 DD, elevated end diastolic filling pressure. RVEF moderately  decreased. Severe pulmonary hypertension.   -Previous EF 35-40% 10/2014 -On carvedilol, amlodipine and lasix.  -On lasix 40 mg IV BID.  Wt down 5 lbs since admission 202 lbs-->197 lbs. (Wt 202 on 10/19/16). I&O net negative 3L.  -Pt breathing much better, no chest discomfort or orthopnea.  -Lasix decreased from 40 mg IV bid to daily today by nephrology.   2. Pericardial effusion -Per echo on 4/28 There is severe pericardial effusion with invagination of the RV and RA but good RV filling in diastole. This is consistent with pre-tamponade state. The patient has severe pulmonary hypertension and no signs of tamponade. No urgent pericardiocentesis is necessary. Nephrology is considering early hemodialysis considering worsening Crea.  -Plan for repeat echo today. If pericardial effusion better will continue to diurese, if worse will likely consider pericardiocentesis. Nephrology plans a trial of HD if not responding well. Had vein mapping this morning for AV fistula placement possibly tomorrow for future Dialysis.   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   3. CKD stage IV -SCr 6.87 (6.30) -Pt being worked up as out patient for renal transplant at [facility] -Nephrology seeing pt. They feel that AKI likely due to ischemic insult from the pericardial effusion and worsening systolic function. -Nephrology recommends continue IV lasix 40 mg bid. Nephro feels that pericardiocentesis may be therapeutic for the pt. Planned repeat echo today.  -[place] of kidneys normal  4. DM -Hgb A1c 5.2 on 10/26/16 -Management per IM  5. HLD -LDL 63, 12/30/16. At goal on atorvastatin 10 mg daily  6. Hypertension -On amlodipine, carvedilol, lasix, hydralazine -BP  remains elevated.  Signed, Daune Perch, NP  01/02/2017, 10:43 AM    I have seen and examined the patient along with Daune Perch, NP .  I have reviewed the chart, notes and new data.  I agree with PA/NP's note.  Key new complaints: he feels well and denies any chest pain (pleuritic or otherwise) and denies dyspnea. Clearly improved with diuretics Key examination changes: JVP elevated 7-8 cm with mild hepatojugular reflux. He is not hypotensive or tachycardic. Mild ankle edema due to joint problems. Key new findings / data: Reviewed both echos from this admission. They are virtually identical, despite the different reports. In my opinion: LVEF 30%, global hypokinesis, large pericardial effusion, without chamber collapse and with at most borderline respiratory variation in flow across the AV valves The IVC is plethoric, c/w markedly elevated RA pressure.  PLAN: Clinically, he is clearly not in tamponade. Even by echo criteria, there is only questionable and limited evidence of tamponade.  Clearly he has a severe cardiomyopathy. He does have elevated mean left and right atrial pressure, but this is likely due to his cardiomyopathy. 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 Despite worsened renal function, it is also unlikely that this is uremic pericarditis, as he does not have any other uremic symptoms. He is firmly against any invasive procedures that are not definitely necessary. Pericardiocentesis may have some diagnostic value, but is not indicated therapeutically. He firmly expresses an aversion to any invasive procedures that are not absolutely necessary. For now, would advise continued diuretic therapy, with close clinical and echo follow up.  [name], MD, [place] [phone] 01/02/2017,  4:39 PM

## 2017-01-02 NOTE — Progress Notes (Signed)
  Echocardiogram 2D Echocardiogram limited has been performed.  Tresa Res 01/02/2017, 2:26 PM

## 2017-01-02 NOTE — Progress Notes (Signed)
Right  Upper Extremity Vein Map    Cephalic  Segment Diameter Depth Comment  1. Axilla 1.50mm 2.67mm   2. Mid upper arm 2.65mm 3.64mm   3. Above AC 3.49mm 4.47mm   4. In AC mm mm IV  5. Below AC 2.88mm 2.33mm   6. Mid forearm 2.6mm 2.74mm   7. Wrist 1.38mm 1.61mm    mm mm    mm mm    mm mm    Basilic  Segment Diameter Depth Comment  1. Axilla 7.67mm 32mm   2. Mid upper arm 39mm 4.95mm   3. Above AC 3.90mm 25mm   4. In AC 2.72mm 3.42mm   5. Below AC 77mm 2.66mm   6. Mid forearm 1.66mm 46mm   7. Wrist 1.67mm 2.46mm    mm mm    mm mm    mm mm    Left Upper Extremity Vein Map    Cephalic  Segment Diameter Depth Comment  1. Axilla 35mm 3.45mm   2. Mid upper arm 1.30mm 4.26mm branch  3. Above AC 3.30mm 5.37mm   4. In AC 3.23mm 2.35mm   5. Below AC 3.2mm 3.51mm Multiple branches  6. Mid forearm 3.62mm 4.85mm   7. Wrist 2.73mm 4.15mm    mm mm    mm mm    mm mm    Basilic  Segment Diameter Depth Comment  1. Axilla mm mm   2. Mid upper arm 4.65mm 77mm   3. Above [facility] 3.53mm 5.26mm Multiple branches  4. In AC 2.17mm 94mm   5. Below AC 2.48mm 2.56mm   6. Mid forearm 1.45mm 1.19mm   7. Wrist 1.67mm 2.61mm    mm mm    mm mm    mm mm

## 2017-01-03 ENCOUNTER — Inpatient Hospital Stay (HOSPITAL_COMMUNITY): Payer: BLUE CROSS/BLUE SHIELD

## 2017-01-03 ENCOUNTER — Other Ambulatory Visit: Payer: Self-pay | Admitting: Internal Medicine

## 2017-01-03 ENCOUNTER — Inpatient Hospital Stay (HOSPITAL_COMMUNITY): Payer: BLUE CROSS/BLUE SHIELD | Admitting: Certified Registered"

## 2017-01-03 ENCOUNTER — Encounter (HOSPITAL_COMMUNITY): Payer: Self-pay | Admitting: Certified Registered Nurse Anesthetist

## 2017-01-03 ENCOUNTER — Encounter (HOSPITAL_COMMUNITY)
Admission: AD | Disposition: A | Discharge status: Home / Self Care | Payer: Self-pay | Source: Other Acute Inpatient Hospital | Attending: Internal Medicine

## 2017-01-03 DIAGNOSIS — I313 Pericardial effusion (noninflammatory): Secondary | ICD-10-CM

## 2017-01-03 DIAGNOSIS — I3139 Other pericardial effusion (noninflammatory): Secondary | ICD-10-CM

## 2017-01-03 DIAGNOSIS — N184 Chronic kidney disease, stage 4 (severe): Secondary | ICD-10-CM

## 2017-01-03 HISTORY — PX: BASCILIC VEIN TRANSPOSITION: SHX5742

## 2017-01-03 HISTORY — PX: PERICARDIOCENTESIS: CATH118255

## 2017-01-03 HISTORY — PX: INSERTION OF DIALYSIS CATHETER: SHX1324

## 2017-01-03 LAB — ROCKY MTN SPOTTED FVR ABS PNL(IGG+IGM)
RMSF IgG: UNDETERMINED
RMSF IgM: 0.53 index (ref 0.00–0.89)

## 2017-01-03 LAB — COXSACKIE B VIRUS ANTIBODIES
Coxsackie B1 Ab: NEGATIVE
Coxsackie B2 Ab: NEGATIVE
Coxsackie B3 Ab: NEGATIVE
Coxsackie B4 Ab: NEGATIVE
Coxsackie B5 Ab: 1:8 {titer} — ABNORMAL HIGH
Coxsackie B6 Ab: NEGATIVE

## 2017-01-03 LAB — PROTIME-INR
INR: 1.15
Prothrombin Time: 14.7 seconds (ref 11.4–15.2)

## 2017-01-03 LAB — GLUCOSE, CAPILLARY
Glucose-Capillary: 106 mg/dL — ABNORMAL HIGH (ref 65–99)
Glucose-Capillary: 98 mg/dL (ref 65–99)
Glucose-Capillary: 95 mg/dL (ref 65–99)

## 2017-01-03 LAB — BODY FLUID CELL COUNT WITH DIFFERENTIAL
Eos, Fluid: 0 %
Lymphs, Fluid: 8 %
Monocyte-Macrophage-Serous Fluid: 90 % (ref 50–90)
Neutrophil Count, Fluid: 2 % (ref 0–25)
Total Nucleated Cell Count, Fluid: 86 cu mm (ref 0–1000)

## 2017-01-03 LAB — RENAL FUNCTION PANEL
Albumin: 3.1 g/dL — ABNORMAL LOW (ref 3.5–5.0)
Anion gap: 10 (ref 5–15)
BUN: 78 mg/dL — ABNORMAL HIGH (ref 6–20)
CO2: 25 mmol/L (ref 22–32)
Calcium: 8.5 mg/dL — ABNORMAL LOW (ref 8.9–10.3)
Chloride: 101 mmol/L (ref 101–111)
Creatinine, Ser: 6.81 mg/dL — ABNORMAL HIGH (ref 0.61–1.24)
GFR calc Af Amer: 10 mL/min — ABNORMAL LOW (ref >60)
GFR calc non Af Amer: 9 mL/min — ABNORMAL LOW (ref >60)
Glucose, Bld: 84 mg/dL (ref 65–99)
Phosphorus: 5.1 mg/dL — ABNORMAL HIGH (ref 2.5–4.6)
Potassium: 3.4 mmol/L — ABNORMAL LOW (ref 3.5–5.1)
Sodium: 136 mmol/L (ref 135–145)

## 2017-01-03 LAB — GRAM STAIN

## 2017-01-03 LAB — CRYOGLOBULIN

## 2017-01-03 LAB — RMSF, IGG, IFA: RMSF, IGG, IFA: 1:64 {titer} — ABNORMAL HIGH

## 2017-01-03 SURGERY — PERICARDIOCENTESIS
Anesthesia: LOCAL

## 2017-01-03 SURGERY — INSERTION OF DIALYSIS CATHETER
Anesthesia: Monitor Anesthesia Care | Site: Neck | Laterality: Right

## 2017-01-03 MED ORDER — MIDAZOLAM HCL 5 MG/5ML IJ SOLN
INTRAMUSCULAR | Status: DC | PRN
  Administered 2017-01-03: 2 mg via INTRAVENOUS

## 2017-01-03 MED ORDER — HEPARIN SODIUM (PORCINE) 1000 UNIT/ML IJ SOLN
INTRAMUSCULAR | Status: DC | PRN
Start: 2017-01-03 — End: 2017-01-03
  Administered 2017-01-03: 3400 [IU] via INTRAVENOUS

## 2017-01-03 MED ORDER — HEPARIN (PORCINE) IN NACL 2-0.9 UNIT/ML-% IJ SOLN
INTRAMUSCULAR | Status: AC
  Filled 2017-01-03: qty 1000

## 2017-01-03 MED ORDER — SODIUM CHLORIDE 0.9 % IV SOLN
INTRAVENOUS | Status: DC | PRN
  Administered 2017-01-03: 12:00:00

## 2017-01-03 MED ORDER — POTASSIUM CHLORIDE ER 10 MEQ PO TBCR: 20.0000 meq | EXTENDED_RELEASE_TABLET | Freq: Every day | ORAL | Status: DC

## 2017-01-03 MED ORDER — FENTANYL CITRATE (PF) 100 MCG/2ML IJ SOLN
25.0000 ug | INTRAMUSCULAR | Status: DC | PRN
  Administered 2017-01-03: 50 ug via INTRAVENOUS

## 2017-01-03 MED ORDER — OXYCODONE HCL 5 MG/5ML PO SOLN
5.0000 mg | Freq: Once | ORAL | Status: AC | PRN
Start: 2017-01-03 — End: 2017-01-03

## 2017-01-03 MED ORDER — MIDAZOLAM HCL 2 MG/2ML IJ SOLN
INTRAMUSCULAR | Status: AC
  Filled 2017-01-03: qty 2

## 2017-01-03 MED ORDER — OXYCODONE HCL 5 MG PO TABS
ORAL_TABLET | ORAL | Status: AC
  Filled 2017-01-03: qty 1

## 2017-01-03 MED ORDER — FENTANYL CITRATE (PF) 100 MCG/2ML IJ SOLN
INTRAMUSCULAR | Status: DC | PRN
  Administered 2017-01-03 (×2): 50 ug via INTRAVENOUS

## 2017-01-03 MED ORDER — PROPOFOL 500 MG/50ML IV EMUL
INTRAVENOUS | Status: DC | PRN
  Administered 2017-01-03: 75 ug/kg/min via INTRAVENOUS

## 2017-01-03 MED ORDER — HEPARIN (PORCINE) IN NACL 2-0.9 UNIT/ML-% IJ SOLN
INTRAMUSCULAR | Status: DC | PRN
Start: 2017-01-03 — End: 2017-01-03
  Administered 2017-01-03: 1000 mL

## 2017-01-03 MED ORDER — SODIUM CHLORIDE 0.9% FLUSH: 3.0000 mL | INTRAVENOUS | Status: DC | PRN

## 2017-01-03 MED ORDER — FENTANYL CITRATE (PF) 100 MCG/2ML IJ SOLN
INTRAMUSCULAR | Status: AC
  Filled 2017-01-03: qty 2

## 2017-01-03 MED ORDER — LIDOCAINE HCL 1 % IJ SOLN
INTRAMUSCULAR | Status: AC
  Filled 2017-01-03: qty 20

## 2017-01-03 MED ORDER — FENTANYL CITRATE (PF) 100 MCG/2ML IJ SOLN
50.0000 ug | INTRAMUSCULAR | Status: AC | PRN
  Administered 2017-01-03: 50 ug via INTRAVENOUS
  Administered 2017-01-04: 100 ug via INTRAVENOUS
  Administered 2017-01-04 (×2): 50 ug via INTRAVENOUS
  Filled 2017-01-03 (×4): qty 2

## 2017-01-03 MED ORDER — FENTANYL CITRATE (PF) 250 MCG/5ML IJ SOLN
INTRAMUSCULAR | Status: AC
  Filled 2017-01-03: qty 5

## 2017-01-03 MED ORDER — FENTANYL CITRATE (PF) 100 MCG/2ML IJ SOLN: 12.5000 ug | INTRAMUSCULAR | Status: DC | PRN

## 2017-01-03 MED ORDER — SODIUM CHLORIDE 0.9 % IV SOLN: INTRAVENOUS | Status: DC

## 2017-01-03 MED ORDER — SODIUM CHLORIDE 0.9% FLUSH
3.0000 mL | Freq: Two times a day (BID) | INTRAVENOUS | Status: DC
  Administered 2017-01-03 – 2017-01-06 (×5): 3 mL via INTRAVENOUS

## 2017-01-03 MED ORDER — FENTANYL CITRATE (PF) 100 MCG/2ML IJ SOLN
12.5000 ug | INTRAMUSCULAR | Status: DC | PRN
  Administered 2017-01-03: 25 ug via INTRAVENOUS

## 2017-01-03 MED ORDER — OXYCODONE HCL 5 MG PO TABS
5.0000 mg | ORAL_TABLET | Freq: Once | ORAL | Status: AC | PRN
  Administered 2017-01-03: 5 mg via ORAL

## 2017-01-03 MED ORDER — FENTANYL CITRATE (PF) 100 MCG/2ML IJ SOLN
50.0000 ug | INTRAMUSCULAR | Status: AC
  Administered 2017-01-03: 50 ug via INTRAVENOUS
  Filled 2017-01-03: qty 2

## 2017-01-03 MED ORDER — FENTANYL CITRATE (PF) 100 MCG/2ML IJ SOLN
INTRAMUSCULAR | Status: DC | PRN
  Administered 2017-01-03 (×2): 25 ug via INTRAVENOUS

## 2017-01-03 MED ORDER — LIDOCAINE HCL (PF) 1 % IJ SOLN
INTRAMUSCULAR | Status: DC | PRN
  Administered 2017-01-03: 6 mL
  Administered 2017-01-03: 20 mL

## 2017-01-03 MED ORDER — HEPARIN SODIUM (PORCINE) 1000 UNIT/ML IJ SOLN
INTRAMUSCULAR | Status: AC
  Filled 2017-01-03: qty 1

## 2017-01-03 MED ORDER — IOPAMIDOL (ISOVUE-300) INJECTION 61%
INTRAVENOUS | Status: AC
  Filled 2017-01-03: qty 50

## 2017-01-03 MED ORDER — LIDOCAINE-EPINEPHRINE (PF) 1 %-1:200000 IJ SOLN
INTRAMUSCULAR | Status: AC
  Filled 2017-01-03: qty 30

## 2017-01-03 MED ORDER — PHENYLEPHRINE 40 MCG/ML (10ML) SYRINGE FOR IV PUSH (FOR BLOOD PRESSURE SUPPORT)
PREFILLED_SYRINGE | INTRAVENOUS | Status: AC
  Filled 2017-01-03: qty 10

## 2017-01-03 MED ORDER — EPHEDRINE 5 MG/ML INJ
INTRAVENOUS | Status: AC
  Filled 2017-01-03: qty 10

## 2017-01-03 MED ORDER — 0.9 % SODIUM CHLORIDE (POUR BTL) OPTIME
TOPICAL | Status: DC | PRN
  Administered 2017-01-03: 1000 mL

## 2017-01-03 MED ORDER — SODIUM CHLORIDE 0.9 % IV SOLN: 250.0000 mL | INTRAVENOUS | Status: DC | PRN

## 2017-01-03 MED ORDER — MIDAZOLAM HCL 2 MG/2ML IJ SOLN
INTRAMUSCULAR | Status: DC | PRN
  Administered 2017-01-03 (×2): 1 mg via INTRAVENOUS

## 2017-01-03 MED ORDER — THROMBIN 20000 UNITS EX SOLR
CUTANEOUS | Status: AC
  Filled 2017-01-03: qty 20000

## 2017-01-03 SURGICAL SUPPLY — 60 items
ARMBAND PINK RESTRICT EXTREMIT (MISCELLANEOUS) ×8 IMPLANT
BAG DECANTER FOR FLEXI CONT (MISCELLANEOUS) ×4 IMPLANT
BIOPATCH RED 1 DISK 7.0 (GAUZE/BANDAGES/DRESSINGS) ×4 IMPLANT
CANISTER SUCT 3000ML PPV (MISCELLANEOUS) ×4 IMPLANT
CANNULA VESSEL 3MM 2 BLNT TIP (CANNULA) ×4 IMPLANT
CATH PALINDROME RT-P 15FX19CM (CATHETERS) IMPLANT
CATH PALINDROME RT-P 15FX23CM (CATHETERS) ×4 IMPLANT
CATH PALINDROME RT-P 15FX28CM (CATHETERS) IMPLANT
CATH PALINDROME RT-P 15FX55CM (CATHETERS) IMPLANT
CHLORAPREP W/TINT 26ML (MISCELLANEOUS) ×4 IMPLANT
CLIP TI MEDIUM 6 (CLIP) ×4 IMPLANT
CLIP TI WIDE RED SMALL 6 (CLIP) ×4 IMPLANT
COVER PROBE W GEL 5X96 (DRAPES) ×4 IMPLANT
COVER SURGICAL LIGHT HANDLE (MISCELLANEOUS) ×4 IMPLANT
DECANTER SPIKE VIAL GLASS SM (MISCELLANEOUS) ×8 IMPLANT
DERMABOND ADVANCED (GAUZE/BANDAGES/DRESSINGS) ×2
DERMABOND ADVANCED .7 DNX12 (GAUZE/BANDAGES/DRESSINGS) ×6 IMPLANT
DRAPE C-ARM 42X72 X-RAY (DRAPES) ×4 IMPLANT
DRAPE CHEST BREAST 15X10 FENES (DRAPES) ×4 IMPLANT
ELECT REM PT RETURN 9FT ADLT (ELECTROSURGICAL) ×4
ELECTRODE REM PT RTRN 9FT ADLT (ELECTROSURGICAL) ×3 IMPLANT
GAUZE SPONGE 4X4 16PLY XRAY LF (GAUZE/BANDAGES/DRESSINGS) IMPLANT
GLOVE BIO SURGEON STRL SZ7 (GLOVE) ×12 IMPLANT
GLOVE BIO SURGEON STRL SZ7.5 (GLOVE) ×4 IMPLANT
GLOVE BIOGEL PI IND STRL 6.5 (GLOVE) ×12 IMPLANT
GLOVE BIOGEL PI IND STRL 7.0 (GLOVE) ×3 IMPLANT
GLOVE BIOGEL PI IND STRL 8 (GLOVE) ×6 IMPLANT
GLOVE BIOGEL PI INDICATOR 6.5 (GLOVE) ×4
GLOVE BIOGEL PI INDICATOR 7.0 (GLOVE) ×1
GLOVE BIOGEL PI INDICATOR 8 (GLOVE) ×2
GLOVE SURG SS PI 8.0 STRL IVOR (GLOVE) ×8 IMPLANT
GOWN STRL REUS W/ TWL LRG LVL3 (GOWN DISPOSABLE) ×12 IMPLANT
GOWN STRL REUS W/ TWL XL LVL3 (GOWN DISPOSABLE) ×3 IMPLANT
GOWN STRL REUS W/TWL 2XL LVL3 (GOWN DISPOSABLE) IMPLANT
GOWN STRL REUS W/TWL LRG LVL3 (GOWN DISPOSABLE) ×4
GOWN STRL REUS W/TWL XL LVL3 (GOWN DISPOSABLE) ×1
KIT BASIN OR (CUSTOM PROCEDURE TRAY) ×4 IMPLANT
KIT ROOM TURNOVER OR (KITS) ×4 IMPLANT
NEEDLE 18GX1X1/2 (RX/OR ONLY) (NEEDLE) ×4 IMPLANT
NEEDLE HYPO 25GX1X1/2 BEV (NEEDLE) ×4 IMPLANT
NS IRRIG 1000ML POUR BTL (IV SOLUTION) ×4 IMPLANT
PACK CV ACCESS (CUSTOM PROCEDURE TRAY) ×4 IMPLANT
PACK SURGICAL SETUP 50X90 (CUSTOM PROCEDURE TRAY) IMPLANT
PAD ARMBOARD 7.5X6 YLW CONV (MISCELLANEOUS) ×8 IMPLANT
SPONGE SURGIFOAM ABS GEL 100 (HEMOSTASIS) IMPLANT
SUT ETHILON 3 0 PS 1 (SUTURE) ×4 IMPLANT
SUT PROLENE 6 0 BV (SUTURE) ×8 IMPLANT
SUT PROLENE 7 0 BV 1 (SUTURE) ×4 IMPLANT
SUT VIC AB 3-0 SH 27 (SUTURE) ×1
SUT VIC AB 3-0 SH 27X BRD (SUTURE) ×3 IMPLANT
SUT VICRYL 4-0 PS2 18IN ABS (SUTURE) ×4 IMPLANT
SYR 10ML LL (SYRINGE) ×4 IMPLANT
SYR 20CC LL (SYRINGE) ×8 IMPLANT
SYR 5ML LL (SYRINGE) ×8 IMPLANT
SYR BULB IRRIGATION 50ML (SYRINGE) ×4 IMPLANT
SYR CONTROL 10ML LL (SYRINGE) ×4 IMPLANT
TOWEL OR 17X24 6PK STRL BLUE (TOWEL DISPOSABLE) ×4 IMPLANT
TOWEL OR 17X26 10 PK STRL BLUE (TOWEL DISPOSABLE) ×4 IMPLANT
UNDERPAD 30X30 (UNDERPADS AND DIAPERS) ×4 IMPLANT
WATER STERILE IRR 1000ML POUR (IV SOLUTION) ×4 IMPLANT

## 2017-01-03 SURGICAL SUPPLY — 3 items
EVACUATOR 1/8 PVC DRAIN (DRAIN) ×2 IMPLANT
PACK CARDIAC CATHETERIZATION (CUSTOM PROCEDURE TRAY) ×2 IMPLANT
PERIVAC PERICARDIOCENTESIS 8.3 (TRAY / TRAY PROCEDURE) ×2 IMPLANT

## 2017-01-03 NOTE — Anesthesia Preprocedure Evaluation (Signed)
Anesthesia Evaluation  Patient identified by MRN, date of birth, ID band Patient awake    Reviewed: Allergy & Precautions, NPO status , Patient's Chart, lab work & pertinent test results  History of Anesthesia Complications Negative for: history of anesthetic complications  Airway Mallampati: I  TM Distance: >3 FB Neck ROM: Full    Dental  (+) Teeth Intact   Pulmonary shortness of breath,    breath sounds clear to auscultation       Cardiovascular hypertension, Pt. on medications and Pt. on home beta blockers +CHF   Rhythm:Regular  pericardial effusion needing drainage   Neuro/Psych negative neurological ROS  negative psych ROS   GI/Hepatic Neg liver ROS, GERD  Medicated and Controlled,  Endo/Other  diabetes, Type 2  Renal/GU ESRF and DialysisRenal disease     Musculoskeletal  (+) Arthritis ,   Abdominal   Peds  Hematology   Anesthesia Other Findings   Reproductive/Obstetrics                             Anesthesia Physical Anesthesia Plan  ASA: III  Anesthesia Plan: MAC   Post-op Pain Management:    Induction:   Airway Management Planned: Nasal Cannula, Natural Airway and Simple Face Mask  Additional Equipment: None  Intra-op Plan:   Post-operative Plan:   Informed Consent: I have reviewed the patients History and Physical, chart, labs and discussed the procedure including the risks, benefits and alternatives for the proposed anesthesia with the patient or authorized representative who has indicated his/her understanding and acceptance.   Dental advisory given  Plan Discussed with: CRNA and Surgeon  Anesthesia Plan Comments:         Anesthesia Quick Evaluation

## 2017-01-03 NOTE — Progress Notes (Signed)
[facility] Progress Note  Patient: James Burke FBP:[id number]   PCP: [name], MD DOB: 1966/01/30   DOA: 12/29/2016   DOS: 01/03/2017   Date of Service: the patient was seen and examined on 01/03/2017  Subjective: feeling better, shortnes of breath is better.   Brief hospital course: Pt. with PMH of  DM type II, Charcot's right foot, CHF class EF 35-40%, andCKD stage III; admitted on 12/29/2016, with complaint of shortnes of breath , was found to have acute on chronic combined CHF with pericardial effusion. Currently further plan is follow up on Echocardiogram and arrange for HD.  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 - No ACE-I 2/2 renal insufficiency  Chest pain, Elevated troponin, Large pericardial effusion: Acute. 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 Posey Pronto.  Following up at [facility] also for possible transplant. Nephrology following.  Planning to arrange HD, ANCA elevated.  Vascular surgery has performed AVF creation and TDC placement Possible HD soon.   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 - Hypoglycemic protocols - hold glipizide - CBGs with sensitive SSI  Hyperlipidemia:  Patient reports no longer being on atorvastatin.  BPH - Continue Flomax   Bowel regimen: last BM 01/01/2017 Diet: cardiac diet DVT Prophylaxis: subcutaneous Heparin  Advance goals of care discussion: full code  Family Communication: family was present at bedside, at the time of interview.   Disposition:    Discharge to home. Expected discharge date: TBD  Consultants: cardiology, vascular surgery, nephrology Procedures: Echocardiogram. Pericardiocentesis, AV fistula TDC placement.  Antibiotics: Anti-infectives    Start     Dose/Rate Route Frequency Ordered Stop   01/03/17 1130  ceFAZolin (ANCEF) IVPB 2g/100 mL premix  Status:  Discontinued    Comments:  Send with pt to OR   2 g 200 mL/hr over 30 Minutes Intravenous To [facility] 01/02/17 1541 01/03/17 1433   01/03/17 1130  ceFAZolin (ANCEF) IVPB 1 g/50 mL premix  Status:  Discontinued    Comments:  Send with pt to OR   1 g 100 mL/hr over 30 Minutes Intravenous To [facility] 01/02/17 1441 01/02/17 1543       Objective: Physical Exam: Vitals:   01/03/17 1625 01/03/17 1630 01/03/17 1632 01/03/17 1637  BP: (!) 155/103 (!) 156/104 (!) 152/101 (!) 160/107  Pulse: 74 82 82 80  Resp: (!) 9 (!) 9 15 10   Temp:      TempSrc:      SpO2: 98% 97% 100% 100%  Weight:      Height:        Intake/Output Summary (Last 24 hours) at 01/03/17 1700 Last data filed at 01/03/17 1312  Gross per 24 hour  Intake              540 ml  Output              600 ml  Net              -60 ml   Filed Weights   01/01/17 0431 01/02/17 0514 01/03/17 0737  Weight: 89.3  kg (196 lb 12.8 oz) 89.7 kg (197 lb 11.2 oz) 89.4 kg (197 lb 1.6 oz)   General: Alert, Awake and Oriented to Time, Place and Person. Appear in mild distress, affect appropriate Eyes: PERRL, Conjunctiva normal ENT: Oral Mucosa clear moist. Neck: no JVD, no Abnormal Mass Or lumps Cardiovascular: S1 and S2 Present, no Murmur, Respiratory: Bilateral Air entry equal and Decreased, no use of accessory muscle, Clear to Auscultation, no Crackles, no wheezes Abdomen: Bowel Sound present, Soft and no tenderness Skin: no redness, no Rash, no induration Extremities: trace Pedal edema, no calf tenderness Neurologic: Grossly no focal neuro deficit. Bilaterally Equal motor strength  Data  Reviewed: CBC:  Recent Labs Lab 12/30/16 0041 12/31/16 0241 01/01/17 0229 01/02/17 0314  WBC 6.5 5.3 5.8 5.4  NEUTROABS 5.0  --   --   --   HGB 10.9* 9.4* 10.5* 10.1*  HCT 32.1* 28.6* 31.1* 30.6*  MCV 81.3 80.8 81.4 81.0  PLT 200 166 179 803   Basic Metabolic Panel:  Recent Labs Lab 12/30/16 0041 12/30/16 0545 12/30/16 0830 12/31/16 0241 01/01/17 0229 01/02/17 0314 01/03/17 0356  NA  --  138  --  136 137 138 136  K  --  3.6  --  3.5 3.5 3.5 3.4*  CL  --  103  --  100* 101 98* 101  CO2  --  24  --  24 24 26 25   GLUCOSE  --  85  --  97 120* 96 84  BUN  --  66*  --  72* 74* 78* 78*  CREATININE  --  6.05*  --  6.28* 6.30* 6.87* 6.81*  CALCIUM  --  8.9  --  8.6* 9.1 8.9 8.5*  MG 2.1  --  2.2 2.1  --   --   --   PHOS  --   --  3.7 4.5 4.7* 5.0* 5.1*    Liver Function Tests:  Recent Labs Lab 12/30/16 1354 12/31/16 0241 01/01/17 0229 01/02/17 0314 01/03/17 0356  AST 13*  --   --   --   --   ALT 14*  --   --   --   --   ALKPHOS 47  --   --   --   --   BILITOT 0.5  --   --   --   --   PROT 6.3*  --   --   --   --   ALBUMIN 3.4* 3.1* 3.4* 3.2* 3.1*   No results for input(s): LIPASE, AMYLASE in the last 168 hours. No results for input(s): AMMONIA in the last 168 hours. Coagulation Profile:  Recent Labs Lab 01/03/17 0356  INR 1.15   Cardiac Enzymes:  Recent Labs Lab 12/30/16 0041 12/30/16 0545 12/30/16 1354  TROPONINI 0.03* 0.04* 0.03*   BNP (last 3 results) No results for input(s): PROBNP in the last 8760 hours. CBG:  Recent Labs Lab 01/02/17 1119 01/02/17 1622 01/02/17 2222 01/03/17 0748 01/03/17 1332  GLUCAP 154* 135* 146* 106* 98   Studies: Dg Chest Port 1 View  Result Date: 01/03/2017 CLINICAL DATA:  Status post dialysis catheter placement. EXAM: PORTABLE CHEST 1 VIEW COMPARISON:  PA and lateral chest 06/04/2014. FINDINGS: Right IJ approach double lumen central venous catheter is in place. Tip of the catheter projects just within the  right atrium. There is no pneumothorax. Cardiomegaly is seen. No pulmonary edema. No pleural effusion. IMPRESSION: Dialysis catheter tip projects just within the right atrium. Negative for pneumothorax.  Cardiomegaly without edema. Electronically Signed   By: Inge Rise M.D.   On: 01/03/2017 14:01   Dg Fluoro Guide Cv Line-no Report  Result Date: 01/03/2017 Fluoroscopy was utilized by the requesting physician.  No radiographic interpretation.    Scheduled Meds: . [MAR Hold] amLODipine  10 mg Oral Daily  . [MAR Hold] aspirin EC  81 mg Oral Daily  . [MAR Hold] carvedilol  25 mg Oral BID WC  . [MAR Hold] docusate sodium  100 mg Oral BID  . [MAR Hold] Dulaglutide  0.75 mg Subcutaneous Weekly  . fentaNYL      . [MAR Hold] furosemide  40 mg Intravenous Daily  . [MAR Hold] hydrALAZINE  25 mg Oral TID  . [MAR Hold] insulin aspart  0-5 Units Subcutaneous QHS  . [MAR Hold] insulin aspart  0-9 Units Subcutaneous TID WC  . [MAR Hold] potassium chloride SA  20 mEq Oral Daily  . [MAR Hold] sodium chloride flush  3 mL Intravenous Q12H  . sodium chloride flush  3 mL Intravenous Q12H  . [MAR Hold] tamsulosin  0.4 mg Oral BID   Continuous Infusions: . [MAR Hold] sodium chloride 500 mL (01/03/17 1018)  . sodium chloride    . sodium chloride     PRN Meds: [MAR Hold] sodium chloride, sodium chloride, [MAR Hold] acetaminophen, [MAR Hold] ondansetron (ZOFRAN) IV, [MAR Hold] oxyCODONE-acetaminophen **AND** [MAR Hold] oxyCODONE, [MAR Hold] sodium chloride flush, sodium chloride flush, [MAR Hold] zolpidem  Time spent: 30 minutes  Author: Berle Mull, MD [facility] Pager: [phone] 01/03/2017 5:00 PM  If 7PM-7AM, please contact night-coverage at [email or web address], password [email or web address]

## 2017-01-03 NOTE — H&P (View-Only) (Signed)
Progress Note  Patient Name: James Burke Date of Encounter: 01/03/2017  Primary Cardiologist: Dr. Stanford Breed  Subjective   Patient without chest discomfort, shortness of breath or orthpnea. He is tearful and stressed about upcoming procedures for dialysis catheter and pericardiocentesis.   Inpatient Medications    Scheduled Meds: . amLODipine  10 mg Oral Daily  . aspirin EC  81 mg Oral Daily  . carvedilol  25 mg Oral BID WC  . docusate sodium  100 mg Oral BID  . Dulaglutide  0.75 mg Subcutaneous Weekly  . furosemide  40 mg Intravenous Daily  . hydrALAZINE  25 mg Oral TID  . insulin aspart  0-5 Units Subcutaneous QHS  . insulin aspart  0-9 Units Subcutaneous TID WC  . potassium chloride SA  20 mEq Oral Daily  . sodium chloride flush  3 mL Intravenous Q12H  . tamsulosin  0.4 mg Oral BID   Continuous Infusions: . sodium chloride    .  ceFAZolin (ANCEF) IV     PRN Meds: sodium chloride, acetaminophen, ondansetron (ZOFRAN) IV, oxyCODONE-acetaminophen **AND** oxyCODONE, sodium chloride flush, zolpidem   Vital Signs    Vitals:   01/02/17 1220 01/02/17 2211 01/03/17 0647 01/03/17 0737  BP: (!) 150/92 (!) 150/91 (!) 144/95   Pulse: 80 75 77   Resp: 18 16 18    Temp: 97.7 F (36.5 C) 98 F (36.7 C) 97.9 F (36.6 C)   TempSrc: Oral Oral Oral   SpO2: 99% 98% 97%   Weight:    197 lb 1.6 oz (89.4 kg)  Height:        Intake/Output Summary (Last 24 hours) at 01/03/17 0952 Last data filed at 01/03/17 0737  Gross per 24 hour  Intake              800 ml  Output             1350 ml  Net             -550 ml   Filed Weights   01/01/17 0431 01/02/17 0514 01/03/17 0737  Weight: 196 lb 12.8 oz (89.3 kg) 197 lb 11.2 oz (89.7 kg) 197 lb 1.6 oz (89.4 kg)    Telemetry    NSR - Personally Reviewed  ECG   No new tracing  Physical Exam   GEN: No acute distress.   Neck: No JVD Cardiac: RRR, no murmurs or rubs. summation gallop.  Respiratory: Clear to auscultation  bilaterally. GI: Soft, nontender, non-distended  MS: No edema; Charcot foot on right. Neuro:  Nonfocal  Psych: Normal affect   Labs    Chemistry Recent Labs Lab 12/30/16 1354  01/01/17 0229 01/02/17 0314 01/03/17 0356  NA  --   < > 137 138 136  K  --   < > 3.5 3.5 3.4*  CL  --   < > 101 98* 101  CO2  --   < > 24 26 25   GLUCOSE  --   < > 120* 96 84  BUN  --   < > 74* 78* 78*  CREATININE  --   < > 6.30* 6.87* 6.81*  CALCIUM  --   < > 9.1 8.9 8.5*  PROT 6.3*  --   --   --   --   ALBUMIN 3.4*  < > 3.4* 3.2* 3.1*  AST 13*  --   --   --   --   ALT 14*  --   --   --   --  ALKPHOS 47  --   --   --   --   BILITOT 0.5  --   --   --   --   GFRNONAA  --   < > 9* 8* 9*  GFRAA  --   < > 11* 10* 10*  ANIONGAP  --   < > 12 14 10   < > = values in this interval not displayed.   Hematology  Recent Labs Lab 12/31/16 0241 01/01/17 0229 01/02/17 0314  WBC 5.3 5.8 5.4  RBC 3.54* 3.82* 3.78*  HGB 9.4* 10.5* 10.1*  HCT 28.6* 31.1* 30.6*  MCV 80.8 81.4 81.0  MCH 26.6 27.5 26.7  MCHC 32.9 33.8 33.0  RDW 13.8 14.2 13.9  PLT 166 179 173    Cardiac Enzymes  Recent Labs Lab 12/30/16 0041 12/30/16 0545 12/30/16 1354  TROPONINI 0.03* 0.04* 0.03*   No results for input(s): TROPIPOC in the last 168 hours.   BNP  Recent Labs Lab 12/30/16 0042  BNP 2,695.8*     DDimer No results for input(s): DDIMER in the last 168 hours.   Radiology    No results found.  Cardiac Studies   Repeat echo on 01/02/17: LVEF 30%, global hypokinesis, large pericardial effusion, without chamber collapse and with at most borderline respiratory variation in flow across the AV valves The IVC is plethoric, c/w markedly elevated RA pressure.  Echo 12/31/16 Study Conclusions   - Left ventricle: The cavity size was severely dilated. Wall   thickness was increased in a pattern of severe LVH. There was   moderate concentric hypertrophy. Systolic function was normal.   The estimated ejection fraction  was in the range of 15% to 20%.   Diffuse hypokinesis. Features are consistent with a pseudonormal   left ventricular filling pattern, with concomitant abnormal   relaxation and increased filling pressure (grade 2 diastolic   dysfunction). Doppler parameters are consistent with elevated   ventricular end-diastolic filling pressure. - Aorta: The aorta was normal, not dilated, and non-diseased. - Mitral valve: There was mild regurgitation. Valve area by   pressure half-time: 1.26 cm^2. - Left atrium: The atrium was moderately to severely dilated. - Right ventricle: Systolic function was moderately reduced. - Tricuspid valve: There was mild regurgitation. - Pulmonic valve: There was trivial regurgitation. - Pulmonary arteries: Systolic pressure was severely increased. PA   peak pressure: 71 mm Hg (S). - Inferior vena cava: The vessel was dilated. The respirophasic   diameter changes were blunted (< 50%), consistent with elevated   central venous pressure. - Pericardium, extracardiac: There was severe pericardial effusion.  Impressions:  - LVEF is severely decreased with moderate LVH and diffuse   hypokinesis.   RVEF is moderately decreased. There is severe pulmonary   hypertension.     There is severe pericardial effusion with invagination of the RV   and RA but good RV filling in diastole.   This is consistent with pre-tamponade state. The patient has   severe pulmonary hypertension and no signs of tamponade.     No urgent pericardiocentesis is necessary. Nephrology is   considering early hemodialysis considering worsening Crea. We   will repeat echocardiogram on Monday.     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  Patient Profile     51 y.o. male  with DM2, chronic systolic and diastolic heart failure, CKD stage IV, HTN, and elevated triglycerides. He presented to [facility] 12/29/16 with shortness of breath, leg  swelling, and  chest discomfort worse with inspiration. He was transferred to [facility] for further management.   Assessment & Plan     1. Acute on chronic systolic and diastolic heart failure -BNP 2700.  -CXR 12/29/16: Enlarged cardiopericardial silhouette. No focal infiltrate or pulmonary edema. No pneumothorax or pleural effusion -CT chest ([facility]) 12/29/16:Moderately large pericardial effusion similar to the prior study. Small bilateral pleural effusions. These are slightly larger than on the prior study. -Echo on 4/28 showed severely dilated LV, EF 15-20%, diffuse hypokinesis, grade 2 DD, elevated end diastolic filling pressure. RVEF moderately decreased. Severe pulmonary hypertension.  Previous EF 35-40% 10/2014 -On carvedilol, amlodipine and lasix.  -On lasix 40 mg IV BID.  Wt down 5 lbs since admission 202 lbs-->197 lbs, stable today. (Wt 202 on 10/19/16). I&O net negative 3.6L.  -Pt breathing much better, no chest discomfort or orthopnea.  -Lasix managed by nephrology.   2. Pericardial effusion -Per echo on 4/28 There is severe pericardial effusion with invagination of the RV and RA but good RV filling in diastole. This is consistent with pre-tamponade state. The patient has severe pulmonary hypertension and no signs of tamponade. 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  -Repeat echo with little change.  -Dr. Sallyanne Kuster discussed with nephrology and pt and plan for pericardiocentesis today. Risks of procedure including bleeding, pneumothorax, arrhythmias, and need for further procedures explained and dicussed with patient and his wife. Pt wishes to proceed.   3. CKD stage IV -SCr 6.81  -Pt being worked up as out patient for renal transplant at [facility] -Nephrology seeing pt.  -Nephrology recommends considering transition to oral lasix today.  -Plan for placement of dialysis access today.   4. DM -Hgb A1c 5.2 on 10/26/16 -Management per IM  5. HLD -LDL 63, 12/30/16. At goal on atorvastatin 10 mg daily  6.  Hypertension -On amlodipine, carvedilol, lasix, hydralazine -BP remains elevated.  Signed, Daune Perch, NP  01/03/2017, 9:52 AM    I have seen and examined the patient along with Daune Perch, NP. I have reviewed the chart, notes and new data.  I agree with NP's note.  Key new complaints: understands need for additional procedures after his discussion with Dr. Joelyn Oms, but is upset. No dyspnea or pleurisy. Key examination changes: has JVD (8-9 cm) but no other signs of tamponade. BP is high, without paradoxus. HR is normal. Clear lungs. Key new findings / data: renal function unchanged from last 2 days  PLAN: AVG and dialysis catheter this AM. Dr. Joelyn Oms raised the concern for severe hypotension with initiation of  intensive dialysis for what may represent uremic pericarditis. Pericardiocentesis this afternoon (allow 1-2 hours after AV graft insertion which will involve heparinization, before pericardiocentesis). This procedure has been fully reviewed with the patient and written informed consent has been obtained.  [name], MD, [place] [phone] 01/03/2017, 10:07 AM

## 2017-01-03 NOTE — Progress Notes (Signed)
Report received from PACU, pt expected back on 3-east any minute.   Call received from cath-lab to request update on pt location. Cath-lab informed of pt's expected return to 3e02, and relayed message regarding note that states "pt must wait 2-3 hours post-op due to use of heparin" this message from PACU, not confirmed per MD notes.

## 2017-01-03 NOTE — Interval H&P Note (Signed)
History and Physical Interval Note:  01/03/2017 3:57 PM  James Burke  has presented today for pericardiocentesis with the diagnosis of pericardial effusion, early tamponade. The various methods of treatment have been discussed with the patient and family. After consideration of risks, benefits and other options for treatment, the patient has consented to  Procedure(s): Pericardiocentesis (N/A) as a surgical intervention .  The patient's history has been reviewed, patient examined, no change in status, stable for surgery.  I have reviewed the patient's chart and labs.  Questions were answered to the patient's satisfaction.     Lauree Chandler

## 2017-01-03 NOTE — Progress Notes (Signed)
Last CHG bath provided to patient with assistance from NT Robin. Wife at bedside. Otherwise ready/awaiting surgery/placement of fistula.

## 2017-01-03 NOTE — Progress Notes (Signed)
Patient returned from PACU, c/a/ox4 c/o some tenderness at surgical sites. Pt is awaiting cath-lab, per cath-lab he Is next in line for the table.

## 2017-01-03 NOTE — Progress Notes (Addendum)
Progress Note  Patient Name: James Burke Date of Encounter: 01/03/2017  Primary Cardiologist: Dr. Stanford Breed  Subjective   Patient without chest discomfort, shortness of breath or orthpnea. He is tearful and stressed about upcoming procedures for dialysis catheter and pericardiocentesis.   Inpatient Medications    Scheduled Meds: . amLODipine  10 mg Oral Daily  . aspirin EC  81 mg Oral Daily  . carvedilol  25 mg Oral BID WC  . docusate sodium  100 mg Oral BID  . Dulaglutide  0.75 mg Subcutaneous Weekly  . furosemide  40 mg Intravenous Daily  . hydrALAZINE  25 mg Oral TID  . insulin aspart  0-5 Units Subcutaneous QHS  . insulin aspart  0-9 Units Subcutaneous TID WC  . potassium chloride SA  20 mEq Oral Daily  . sodium chloride flush  3 mL Intravenous Q12H  . tamsulosin  0.4 mg Oral BID   Continuous Infusions: . sodium chloride    .  ceFAZolin (ANCEF) IV     PRN Meds: sodium chloride, acetaminophen, ondansetron (ZOFRAN) IV, oxyCODONE-acetaminophen **AND** oxyCODONE, sodium chloride flush, zolpidem   Vital Signs    Vitals:   01/02/17 1220 01/02/17 2211 01/03/17 0647 01/03/17 0737  BP: (!) 150/92 (!) 150/91 (!) 144/95   Pulse: 80 75 77   Resp: 18 16 18    Temp: 97.7 F (36.5 C) 98 F (36.7 C) 97.9 F (36.6 C)   TempSrc: Oral Oral Oral   SpO2: 99% 98% 97%   Weight:    197 lb 1.6 oz (89.4 kg)  Height:        Intake/Output Summary (Last 24 hours) at 01/03/17 0952 Last data filed at 01/03/17 0737  Gross per 24 hour  Intake              800 ml  Output             1350 ml  Net             -550 ml   Filed Weights   01/01/17 0431 01/02/17 0514 01/03/17 0737  Weight: 196 lb 12.8 oz (89.3 kg) 197 lb 11.2 oz (89.7 kg) 197 lb 1.6 oz (89.4 kg)    Telemetry    NSR - Personally Reviewed  ECG   No new tracing  Physical Exam   GEN: No acute distress.   Neck: No JVD Cardiac: RRR, no murmurs or rubs. summation gallop.  Respiratory: Clear to auscultation  bilaterally. GI: Soft, nontender, non-distended  MS: No edema; Charcot foot on right. Neuro:  Nonfocal  Psych: Normal affect   Labs    Chemistry Recent Labs Lab 12/30/16 1354  01/01/17 0229 01/02/17 0314 01/03/17 0356  NA  --   < > 137 138 136  K  --   < > 3.5 3.5 3.4*  CL  --   < > 101 98* 101  CO2  --   < > 24 26 25   GLUCOSE  --   < > 120* 96 84  BUN  --   < > 74* 78* 78*  CREATININE  --   < > 6.30* 6.87* 6.81*  CALCIUM  --   < > 9.1 8.9 8.5*  PROT 6.3*  --   --   --   --   ALBUMIN 3.4*  < > 3.4* 3.2* 3.1*  AST 13*  --   --   --   --   ALT 14*  --   --   --   --  ALKPHOS 47  --   --   --   --   BILITOT 0.5  --   --   --   --   GFRNONAA  --   < > 9* 8* 9*  GFRAA  --   < > 11* 10* 10*  ANIONGAP  --   < > 12 14 10   < > = values in this interval not displayed.   Hematology  Recent Labs Lab 12/31/16 0241 01/01/17 0229 01/02/17 0314  WBC 5.3 5.8 5.4  RBC 3.54* 3.82* 3.78*  HGB 9.4* 10.5* 10.1*  HCT 28.6* 31.1* 30.6*  MCV 80.8 81.4 81.0  MCH 26.6 27.5 26.7  MCHC 32.9 33.8 33.0  RDW 13.8 14.2 13.9  PLT 166 179 173    Cardiac Enzymes  Recent Labs Lab 12/30/16 0041 12/30/16 0545 12/30/16 1354  TROPONINI 0.03* 0.04* 0.03*   No results for input(s): TROPIPOC in the last 168 hours.   BNP  Recent Labs Lab 12/30/16 0042  BNP 2,695.8*     DDimer No results for input(s): DDIMER in the last 168 hours.   Radiology    No results found.  Cardiac Studies   Repeat echo on 01/02/17: LVEF 30%, global hypokinesis, large pericardial effusion, without chamber collapse and with at most borderline respiratory variation in flow across the AV valves The IVC is plethoric, c/w markedly elevated RA pressure.  Echo 12/31/16 Study Conclusions   - Left ventricle: The cavity size was severely dilated. Wall   thickness was increased in a pattern of severe LVH. There was   moderate concentric hypertrophy. Systolic function was normal.   The estimated ejection fraction  was in the range of 15% to 20%.   Diffuse hypokinesis. Features are consistent with a pseudonormal   left ventricular filling pattern, with concomitant abnormal   relaxation and increased filling pressure (grade 2 diastolic   dysfunction). Doppler parameters are consistent with elevated   ventricular end-diastolic filling pressure. - Aorta: The aorta was normal, not dilated, and non-diseased. - Mitral valve: There was mild regurgitation. Valve area by   pressure half-time: 1.26 cm^2. - Left atrium: The atrium was moderately to severely dilated. - Right ventricle: Systolic function was moderately reduced. - Tricuspid valve: There was mild regurgitation. - Pulmonic valve: There was trivial regurgitation. - Pulmonary arteries: Systolic pressure was severely increased. PA   peak pressure: 71 mm Hg (S). - Inferior vena cava: The vessel was dilated. The respirophasic   diameter changes were blunted (< 50%), consistent with elevated   central venous pressure. - Pericardium, extracardiac: There was severe pericardial effusion.  Impressions:  - LVEF is severely decreased with moderate LVH and diffuse   hypokinesis.   RVEF is moderately decreased. There is severe pulmonary   hypertension.     There is severe pericardial effusion with invagination of the RV   and RA but good RV filling in diastole.   This is consistent with pre-tamponade state. The patient has   severe pulmonary hypertension and no signs of tamponade.     No urgent pericardiocentesis is necessary. Nephrology is   considering early hemodialysis considering worsening Crea. We   will repeat echocardiogram on Monday.     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  Patient Profile     51 y.o. male  with DM2, chronic systolic and diastolic heart failure, CKD stage IV, HTN, and elevated triglycerides. He presented to [facility] 12/29/16 with shortness of breath, leg  swelling, and  chest discomfort worse with inspiration. He was transferred to [facility] for further management.   Assessment & Plan     1. Acute on chronic systolic and diastolic heart failure -BNP 2700.  -CXR 12/29/16: Enlarged cardiopericardial silhouette. No focal infiltrate or pulmonary edema. No pneumothorax or pleural effusion -CT chest ([facility]) 12/29/16:Moderately large pericardial effusion similar to the prior study. Small bilateral pleural effusions. These are slightly larger than on the prior study. -Echo on 4/28 showed severely dilated LV, EF 15-20%, diffuse hypokinesis, grade 2 DD, elevated end diastolic filling pressure. RVEF moderately decreased. Severe pulmonary hypertension.  Previous EF 35-40% 10/2014 -On carvedilol, amlodipine and lasix.  -On lasix 40 mg IV BID.  Wt down 5 lbs since admission 202 lbs-->197 lbs, stable today. (Wt 202 on 10/19/16). I&O net negative 3.6L.  -Pt breathing much better, no chest discomfort or orthopnea.  -Lasix managed by nephrology.   2. Pericardial effusion -Per echo on 4/28 There is severe pericardial effusion with invagination of the RV and RA but good RV filling in diastole. This is consistent with pre-tamponade state. The patient has severe pulmonary hypertension and no signs of tamponade. 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  -Repeat echo with little change.  -Dr. Sallyanne Kuster discussed with nephrology and pt and plan for pericardiocentesis today. Risks of procedure including bleeding, pneumothorax, arrhythmias, and need for further procedures explained and dicussed with patient and his wife. Pt wishes to proceed.   3. CKD stage IV -SCr 6.81  -Pt being worked up as out patient for renal transplant at [facility] -Nephrology seeing pt.  -Nephrology recommends considering transition to oral lasix today.  -Plan for placement of dialysis access today.   4. DM -Hgb A1c 5.2 on 10/26/16 -Management per IM  5. HLD -LDL 63, 12/30/16. At goal on atorvastatin 10 mg daily  6.  Hypertension -On amlodipine, carvedilol, lasix, hydralazine -BP remains elevated.  Signed, Daune Perch, NP  01/03/2017, 9:52 AM    I have seen and examined the patient along with Daune Perch, NP. I have reviewed the chart, notes and new data.  I agree with NP's note.  Key new complaints: understands need for additional procedures after his discussion with Dr. Joelyn Oms, but is upset. No dyspnea or pleurisy. Key examination changes: has JVD (8-9 cm) but no other signs of tamponade. BP is high, without paradoxus. HR is normal. Clear lungs. Key new findings / data: renal function unchanged from last 2 days  PLAN: AVG and dialysis catheter this AM. Dr. Joelyn Oms raised the concern for severe hypotension with initiation of  intensive dialysis for what may represent uremic pericarditis. Pericardiocentesis this afternoon (allow 1-2 hours after AV graft insertion which will involve heparinization, before pericardiocentesis). This procedure has been fully reviewed with the patient and written informed consent has been obtained.  [name], MD, [place] [phone] 01/03/2017, 10:07 AM

## 2017-01-03 NOTE — H&P (View-Only) (Signed)
ASSESSMENT & PLAN   AKI/ STAGE 4 CKD:  His vein mapping is pending. He is right-handed. He has an IV in his right arm. I will tentatively plan placement of a left arterial venous fistula or possible graft on Tuesday. If his renal function does not improve then we will place a tunneled dialysis catheter at that time. I will need nephrology's input on whether or not to place the catheter.  REASON FOR CONSULT:    Needs catheter and an AV fistula. Consult is from Dr. Marval Regal.   HPI:   James Burke is a 51 y.o. male who was admitted on 12/30/2016 with chest pain and shortness of breath. He was found to have a large pericardial effusion with combined systolic and diastolic congestive heart failure. This was acute on chronic. During this admission he had acute renal failure superimposed on stage IV chronic kidney disease. He is followed as an outpatient by Dr. Posey Pronto. Nephrology asked [place] to see the patient tomorrow to evaluate him for hemodialysis access. If his kidney function does not improve it would also like [place] to place a tunneled dialysis catheter.  History of shortness of breath has improved significantly since admission and he is being diuresed. He denies any other uremic symptoms. Specifically he denies nausea, vomiting, fatigue, or anorexia.  Past Medical History:  Diagnosis Date  . 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 C. difficile colitis 04/18/2008  . Congestive heart failure ([place])   . Diabetes mellitus   . DVT (deep venous thrombosis) ([facility])    right leg   . GERD (gastroesophageal reflux disease)   . Renal insufficiency     Family History  Problem Relation Age of Onset  . Heart disease      No family history  . Cancer Brother     SOCIAL HISTORY: Social History  Substance Use Topics  . Smoking status: Never Smoker  . Smokeless tobacco: Current User    Types: Chew  . Alcohol use No    Allergies  Allergen Reactions  . Methocarbamol Diarrhea  . Prednisone     Other  reaction(s): Other blindness  . Simvastatin Other (See Comments)    Joint aches.     Current Facility-Administered Medications  Medication Dose Route Frequency Provider Last Rate Last Dose  . 0.9 %  sodium chloride infusion  250 mL Intravenous PRN Norval Morton, MD      . acetaminophen (TYLENOL) tablet 650 mg  650 mg Oral Q4H PRN Rondell A Tamala Julian, MD      . amLODipine (NORVASC) tablet 10 mg  10 mg Oral Daily Dorothy Spark, MD   10 mg at 12/31/16 1742  . aspirin EC tablet 81 mg  81 mg Oral Daily Norval Morton, MD   81 mg at 01/01/17 0948  . carvedilol (COREG) tablet 25 mg  25 mg Oral BID WC Norval Morton, MD   25 mg at 01/01/17 0719  . Dulaglutide SOPN 0.75 mg  0.75 mg Subcutaneous Weekly Lavina Hamman, MD   0.75 mg at 12/31/16 1741  . furosemide (LASIX) injection 40 mg  40 mg Intravenous Q12H Norval Morton, MD   40 mg at 01/01/17 0720  . hydrALAZINE (APRESOLINE) tablet 25 mg  25 mg Oral TID Eileen Stanford, PA-C   25 mg at 01/01/17 0948  . insulin aspart (novoLOG) injection 0-5 Units  0-5 Units Subcutaneous QHS Rondell A Smith, MD      . insulin aspart (novoLOG)  injection 0-9 Units  0-9 Units Subcutaneous TID WC Norval Morton, MD   1 Units at 12/30/16 1748  . ondansetron (ZOFRAN) injection 4 mg  4 mg Intravenous Q6H PRN Norval Morton, MD      . oxyCODONE-acetaminophen (PERCOCET/ROXICET) 5-325 MG per tablet 1 tablet  1 tablet Oral Q4H PRN Norval Morton, MD   1 tablet at 01/01/17 0726   And  . oxyCODONE (Oxy IR/ROXICODONE) immediate release tablet 5 mg  5 mg Oral Q4H PRN Norval Morton, MD   5 mg at 01/01/17 0726  . potassium chloride SA (K-DUR,KLOR-CON) CR tablet 20 mEq  20 mEq Oral Daily Norval Morton, MD   20 mEq at 01/01/17 0948  . sodium chloride flush (NS) 0.9 % injection 3 mL  3 mL Intravenous Q12H Norval Morton, MD   3 mL at 01/01/17 0949  . sodium chloride flush (NS) 0.9 % injection 3 mL  3 mL Intravenous PRN Norval Morton, MD      . tamsulosin (FLOMAX)  capsule 0.4 mg  0.4 mg Oral BID Lavina Hamman, MD   0.4 mg at 01/01/17 5621  . zolpidem (AMBIEN) tablet 10 mg  10 mg Oral QHS PRN Norval Morton, MD   10 mg at 12/31/16 2130   REVIEW OF SYSTEMS:  [X]  denotes positive finding, [ ]  denotes negative finding Cardiac  Comments:  Chest pain or chest pressure: X resolved  Shortness of breath upon exertion: X improved  Short of breath when lying flat:    Irregular heart rhythm:        Vascular    Pain in calf, thigh, or hip brought on by ambulation:    Pain in feet at night that wakes you up from your sleep:     Blood clot in your veins:    Leg swelling:         Pulmonary    Oxygen at home:    Productive cough:     Wheezing:         Neurologic    Sudden weakness in arms or legs:     Sudden numbness in arms or legs:     Sudden onset of difficulty speaking or slurred speech:    Temporary loss of vision in one eye:     Problems with dizziness:         Gastrointestinal    Blood in stool:     Vomited blood:         Genitourinary    Burning when urinating:     Blood in urine:        Psychiatric    Major depression:         Hematologic    Bleeding problems:    Problems with blood clotting too easily:        Skin    Rashes or ulcers:        Constitutional    Fever or chills:    -  PHYSICAL EXAM:   Vitals:   12/31/16 1747 12/31/16 2007 01/01/17 0431 01/01/17 1208  BP: (!) 149/94 (!) 153/97 (!) 154/97 (!) 149/95  Pulse: 83 77 78 76  Resp: 18 18  20   Temp:  98.3 F (36.8 C) 98.2 F (36.8 C) 98.3 F (36.8 C)  TempSrc:  Oral Oral Oral  SpO2:  97% 95% 99%  Weight:   196 lb 12.8 oz (89.3 kg)   Height:       Body mass index  is 26.69 kg/m. GENERAL: The patient is a well-nourished male, in no acute distress. The vital signs are documented above. CARDIAC: There is a regular rate and rhythm.  VASCULAR: I do not detect carotid bruits. He has palpable brachial and radial pulses bilaterally. He has no significant lower  extremity swelling. PULMONARY: There is good air exchange bilaterally without wheezing or rales. ABDOMEN: Soft and non-tender with normal pitched bowel sounds.  MUSCULOSKELETAL: He has a Charcot joint of the right ankle. NEUROLOGIC: No focal weakness or paresthesias are detected. SKIN: There are no ulcers or rashes noted. PSYCHIATRIC: The patient has a normal affect.  DATA:    Lab Results  Component Value Date   WBC 5.8 01/01/2017   HGB 10.5 (L) 01/01/2017   HCT 31.1 (L) 01/01/2017   MCV 81.4 01/01/2017   PLT 179 01/01/2017   Lab Results  Component Value Date   NA 137 01/01/2017   K 3.5 01/01/2017   CL 101 01/01/2017   CO2 24 01/01/2017   Lab Results  Component Value Date   CREATININE 6.30 (H) 01/01/2017   No results found for: INR, PROTIME Lab Results  Component Value Date   HGBA1C 5.2 10/26/2016   CBG (last 3)   Recent Labs  12/31/16 1649 12/31/16 2120 01/01/17 1128  GLUCAP 115* 151* 125*   BILATERAL UPPER EXTREMITY VEIN MAP: This has been ordered but is pending.  Deitra Mayo [facility] [place]: [place] Office: [phone]

## 2017-01-03 NOTE — Interval H&P Note (Signed)
   History and Physical Update  The patient was interviewed and re-examined.  The patient's previous History and Physical has been reviewed and is unchanged from Dr. Nicole Cella consult.  There is no change in the plan of care: Left arm arteriovenous fistula vs arteriovenous graft, placement of tunneled dialysis catheter.   Risk, benefits, and alternatives to access surgery were discussed.    The patient is aware the risks include but are not limited to: bleeding, infection, steal syndrome, nerve damage, ischemic monomelic neuropathy, thrombosis, failure to mature, need for additional procedures, death and stroke.   The patient is aware the risks of tunneled dialysis catheter placement include but are not limited to: bleeding, infection, central venous injury, pneumothorax, possible venous stenosis, possible malpositioning in the venous system, and possible infections related to long-term catheter presence.  The patient was aware of these risks and agreed to proceed.   Adele Barthel, MD, FACS [facility] Office: [phone] Pager: [phone]  01/03/2017, 10:34 AM

## 2017-01-03 NOTE — Progress Notes (Signed)
Report provided to OR and Cath lab. Patient was consented for both procedures; OR by night shift and cath lab by this nurse- per verbal guidance provided by cardiology NP on unit at the time.   Pt transported off the floor for the first procedure, will likely head straight to cath lab s/p OR.

## 2017-01-03 NOTE — Progress Notes (Signed)
[facility] Progress Note    Assessment/ Plan:   AKI/progressive CKD-4: Likely secondary to ischemic insult to his kidney from pericardial effusion and worsening systolic heart failure. Renal ultrasound normal. Work-up negative thus far except for equivocal ANCA at 4.0. UPC 8.96. UOP 1.4 L. Net -0.9L/24hrs. -4 L since admit. Serum creatinine improved slightly from 6.87 to 6.81. BUN no change from yesterday (78). No uremic symptoms.   IV Lasix 40mg  qd. Can consider transition to PO today.   Left atrial venous fistula or graft tentatively scheduled for today by [facility]  Autoimmune labs WNL  Daily renal function  Severe pericardial effusion: CT chest on 4/[facility] at [facility] was read as moderately large pericardial effusion. Echo 4/28 with severe pericardial effusion, consistent with pre-tamponade state. Repeat echo yesterday with persistent large pericardial effusion with dilated IVC and early RV collapse suggestive of tamponade.   Consider pericardiocentesis. This could also help diagnostically and therpeutically.   Lasix as above  HFrEF:  Echo CHO 4/28 with EF 15-20% and diffuse hypokinesis (35-40% wtih G1DD in 2016). Repeat echo 4/30 with EF improved to 35%. No cardiopulmonary symptoms this morning.   Plan per Cardiology  Lasix as above  Continue Coreg  DM-2: Well-controlled.   Per primary  Subjective:    Patient with no complaints this AM. Says he is feeling well and is ready to go home.    Objective:   BP (!) 144/95 (BP Location: Right Arm)   Pulse 77   Temp 97.9 F (36.6 C) (Oral)   Resp 18   Ht 6' (1.829 m)   Wt 89.4 kg (197 lb 1.6 oz) Comment: a scale  SpO2 97%   BMI 26.73 kg/m   Intake/Output Summary (Last 24 hours) at 01/03/17 0840 Last data filed at 01/03/17 0737  Gross per 24 hour  Intake             1023 ml  Output             1950 ml  Net             -927 ml   Weight change:   Physical Exam: GEN: Sitting up in bed in  NAD CVS: RRR, no MRG appreciated RESP: CTAB, normal work of breathing GI: +BS, soft, non-tender, non-distended MSK: Right lower extremity wasting with right foot deformity from Charcot's disease; no LE edema  Imaging: No results found.  Labs: BMET  Recent Labs Lab 12/30/16 0545 12/30/16 0830 12/31/16 0241 01/01/17 0229 01/02/17 0314 01/03/17 0356  NA 138  --  136 137 138 136  K 3.6  --  3.5 3.5 3.5 3.4*  CL 103  --  100* 101 98* 101  CO2 24  --  24 24 26 25   GLUCOSE 85  --  97 120* 96 84  BUN 66*  --  72* 74* 78* 78*  CREATININE 6.05*  --  6.28* 6.30* 6.87* 6.81*  CALCIUM 8.9  --  8.6* 9.1 8.9 8.5*  PHOS  --  3.7 4.5 4.7* 5.0* 5.1*   CBC  Recent Labs Lab 12/30/16 0041 12/31/16 0241 01/01/17 0229 01/02/17 0314  WBC 6.5 5.3 5.8 5.4  NEUTROABS 5.0  --   --   --   HGB 10.9* 9.4* 10.5* 10.1*  HCT 32.1* 28.6* 31.1* 30.6*  MCV 81.3 80.8 81.4 81.0  PLT 200 166 179 173    Medications:    . amLODipine  10 mg Oral Daily  . aspirin EC  81 mg Oral Daily  . carvedilol  25 mg Oral BID WC  . docusate sodium  100 mg Oral BID  . Dulaglutide  0.75 mg Subcutaneous Weekly  . furosemide  40 mg Intravenous Daily  . hydrALAZINE  25 mg Oral TID  . insulin aspart  0-5 Units Subcutaneous QHS  . insulin aspart  0-9 Units Subcutaneous TID WC  . potassium chloride SA  20 mEq Oral Daily  . sodium chloride flush  3 mL Intravenous Q12H  . tamsulosin  0.4 mg Oral BID    Adin Hector, MD PGY-2 01/03/17  8:40 AM

## 2017-01-03 NOTE — Transfer of Care (Signed)
Immediate Anesthesia Transfer of Care Note  Patient: James Burke  Procedure(s) Performed: Procedure(s): INSERTION OF DIALYSIS CATHETER RIGHT INTERNAL JUGULAR (Right) FIRST STAGE BRACHIAL VEIN TRANSPOSITION (Left)  Patient Location: PACU  Anesthesia Type:MAC  Level of Consciousness: awake, alert , oriented and patient cooperative  Airway & Oxygen Therapy: Patient Spontanous Breathing and Patient connected to face mask oxygen  Post-op Assessment: Report given to RN, Post -op Vital signs reviewed and stable and Patient moving all extremities  Post vital signs: Reviewed and stable  Last Vitals:  Vitals:   01/02/17 2211 01/03/17 0647  BP: (!) 150/91 (!) 144/95  Pulse: 75 77  Resp: 16 18  Temp: 36.7 C 36.6 C    Last Pain:  Vitals:   01/03/17 0834  TempSrc:   PainSc: 0-No pain      Patients Stated Pain Goal: 2 (33/29/51 8841)  Complications: No apparent anesthesia complications

## 2017-01-03 NOTE — Op Note (Signed)
OPERATIVE NOTE  PROCEDURE: 1.  Right internal jugular vein tunneled dialysis catheter placement 2.  Right internal jugular vein cannulation under ultrasound guidance 3.  Left first stage brachial vein transposition (brachioulnar)  PRE-OPERATIVE DIAGNOSIS: end-stage renal failure  POST-OPERATIVE DIAGNOSIS: same as above  SURGEON: Adele Barthel, MD  ANESTHESIA: local and IV sedation  ESTIMATED BLOOD LOSS: 30 cc  FINDING(S): 1.  Tips of the catheter in the right atrium on fluoroscopy 2.  No obvious pneumothorax on fluoroscopy 3.  Likely high arterial bifurcation 4.  Best vein: cubital (3 mm) > cephalic vein (2 mm) > brachial vein (<2 mm) 5.  Palpable thrill in fistula at end of case 6.  Dopplerable radial and ulnar signals  SPECIMEN(S):  none  INDICATIONS:   James Burke is a 51 y.o. male who presents with end stage renal disease with imminent need for hemodialysis.  The patient presents for tunneled dialysis catheter placement and left arm arteriovenous fistula vs graft placement.  The patient is aware the risks of tunneled dialysis catheter placement include but are not limited to: bleeding, infection, central venous injury, pneumothorax, possible venous stenosis, possible malpositioning in the venous system, and possible infections related to long-term catheter presence.  Risk, benefits, and alternatives to access surgery were discussed.  The patient is aware the risks include but are not limited to: bleeding, infection, steal syndrome, nerve damage, ischemic monomelic neuropathy, thrombosis, failure to mature, need for additional procedures, death and stroke.  The patient is aware any transposition will be done in a staged fashion, requiring a second operation.  The patient was aware of these risks and agreed to proceed.   DESCRIPTION: After written full informed consent was obtained from the patient, the patient was taken back to the operating room.  Prior to induction, the  patient was given IV antibiotics.  After obtaining adequate sedation, the patient was prepped and draped in the standard fashion for a chest or neck tunneled dialysis catheter placement.     The cannulation site, the catheter exit site, and tract for the subcutaneous tunnel were then anesthestized with a total of 20 cc of 1% lidocaine without epinephrine.  Under ultrasound guidance, the right internal jugular vein was cannulated with the 18 gauge needle.  A J-wire was then placed down into the inferior vena cava under fluoroscopic guidance.  The wire was then secured in place with a clamp to the drapes.  The tapered wire guide was left occluding the lumen of the needle to avoid air embolism.    I then made stab incisions at the neck and exit sites.   I dissected from the exit site to the cannulation site with a tunneler.   The wire was then unclamped and I removed the needle.  The skin tract and venotomy was dilated serially with graduated dilators, passed under fluoroscopic guidance.   Finally, the dilator-sheath was placed under fluoroscopic guidance into the superior vena cava.  The central dilator and wire were removed.  A 23 cm Palindrome catheter was placed under fluoroscopic guidance down into the right atrium.    The sheath was broken and peeled away while holding the catheter cuff at the level of the skin.  The catheter was clamped with the plastic clamp and the back end of this catheter was transected.  One lumen was docked onto the tunneler.  The catheter was delivered through the subcutaneous tunnel by pulling the tunneler out the exit site.  The catheter was reclamped and  was transected a second time, revealing the two lumens of this catheter.  The catheter collar was loaded over the back end of the catheter.  The two  ports were docked onto these two lumens.  The catheter collar was then snapped into place, securing the two ports.  Each port was tested by aspirating and flushing.  No resistance  was noted.  Each port was then thoroughly flushed with heparinized saline.  The catheter was secured in placed with two interrupted stitches of 3-0 Nylon tied to the catheter.  The neck incision was closed with a U-stitch of 4-0 Monocryl.  The neck and chest incision were cleaned.  The closed neck incision was reinforced with Dermabond and sterile bandages applied to the catheter exit site.  Each port was then loaded with concentrated heparin (1000 Units/mL) at the manufacturer recommended volumes to each port.  Sterile caps were applied to each port.    On completion fluoroscopy, the tips of the catheter were in the right atrium, and there was no evidence of pneumothorax.  At this point, I took down the drapes and the patient was repositioned for a left arm access procedure.  The patient reprepped and redraped for a left arm access.  I turned my attention first to identifying the patient's brachial vein, cephalic vein, and basilic vein, and brachial artery.  Using SonoSite guidance, the location of these vessels were marked out on the skin.     At this point, I injected local anesthetic to obtain a field block of the antecubitum.  In total, I injected about 10 mL of 1% lidocaine without epinephrine.  I made a transverse incision at the level of the antecubitum and dissected through the subcutaneous tissue and fascia to gain exposure of the brachial artery.  This was noted to be 3 mm in diameter externally.  This was dissected out proximally and distally and controlled with vessel loops.  In this process, I found an smaller superficial artery that was suspicious for a radial artery.  I then dissected out the cephalic and brachial veins.  The cephalic vein appeared diseased and only 2 mm in diameter.  In the process of dissecting out the brachial artery and vein, I found a larger cubital vein.  This was noted to be 3 mm in diameter externally.  The distal segment of the vein was ligated with a  2-0 silk, and  the vein was transected.  The proximal segment was interrogated with serial dilators.  The vein accepted up to a 3 mm dilator without any difficulty.  I then instilled the heparinized saline into the vein and clamped it.  The palpable impulse in the vein was consistent a cubital vein draining into the brachial vein more proximally.    At this point, I reset my exposure of the brachial artery and placed the artery under tension proximally and distally.  I made an arteriotomy with a #11 blade, and then I extended the arteriotomy with a Potts scissor.  I injected heparinized saline proximal and distal to this arteriotomy.  The vein was then sewn to the artery in an end-to-side configuration with a running stitch of 7-0 Prolene.  Prior to completing this anastomosis, I allowed the vein and artery to backbleed.  There was no evidence of clot from any vessels.    I completed the anastomosis in the usual fashion and then released all vessel loops and clamps.  There was a faintly palpable thrill in the venous outflow, and there  was a dopplerable radial and ulnar signals.  These did not appear to augment with venous outflow compression.  At this point, I irrigated out the surgical wound.  There was no further active bleeding.  The subcutaneous tissue was reapproximated with a running stitch of 3-0 Vicryl.  The skin was then reapproximated with a running subcuticular stitch of 4-0 Monocryl.  The skin was then cleaned, dried, and reinforced with Dermabond.  The patient tolerated this procedure well.    COMPLICATIONS: none  CONDITION: stable   Adele Barthel, MD, [facility] [place] Office: [phone] Pager: [phone]  01/03/2017, 11:55 AM

## 2017-01-04 ENCOUNTER — Encounter (HOSPITAL_COMMUNITY): Payer: Self-pay | Admitting: Cardiovascular Disease

## 2017-01-04 ENCOUNTER — Telehealth: Payer: Self-pay | Admitting: Vascular Surgery

## 2017-01-04 ENCOUNTER — Other Ambulatory Visit: Payer: Self-pay | Admitting: Internal Medicine

## 2017-01-04 ENCOUNTER — Inpatient Hospital Stay (HOSPITAL_COMMUNITY): Payer: BLUE CROSS/BLUE SHIELD

## 2017-01-04 DIAGNOSIS — J918 Pleural effusion in other conditions classified elsewhere: Secondary | ICD-10-CM

## 2017-01-04 LAB — ECHOCARDIOGRAM LIMITED
FS: 12 % — AB (ref 28–44)
Height: 72 in
IVS/LV PW RATIO, ED: 1.02
LA ID, A-P, ES: 42 mm
LA diam end sys: 42 mm
LA diam index: 2.02 cm/m2
LV PW d: 13.5 mm — AB (ref 0.6–1.1)
LV dias vol index: 107 mL/m2
LV dias vol: 222 mL — AB (ref 62–150)
LV sys vol index: 69 mL/m2
LV sys vol: 143 mL — AB (ref 21–61)
Reg peak vel: 93.7 cm/s
Simpson's disk: 36
Stroke v: 79 ml
TR max vel: 93.7 cm/s
Weight: 3114.66 oz

## 2017-01-04 LAB — CBC
HCT: 32.9 % — ABNORMAL LOW (ref 39.0–52.0)
Hemoglobin: 10.9 g/dL — ABNORMAL LOW (ref 13.0–17.0)
MCH: 27 pg (ref 26.0–34.0)
MCHC: 33.1 g/dL (ref 30.0–36.0)
MCV: 81.4 fL (ref 78.0–100.0)
Platelets: 194 10*3/uL (ref 150–400)
RBC: 4.04 MIL/uL — ABNORMAL LOW (ref 4.22–5.81)
RDW: 14 % (ref 11.5–15.5)
WBC: 17.6 10*3/uL — ABNORMAL HIGH (ref 4.0–10.5)

## 2017-01-04 LAB — RENAL FUNCTION PANEL
Albumin: 3.2 g/dL — ABNORMAL LOW (ref 3.5–5.0)
Anion gap: 16 — ABNORMAL HIGH (ref 5–15)
BUN: 80 mg/dL — ABNORMAL HIGH (ref 6–20)
CO2: 18 mmol/L — ABNORMAL LOW (ref 22–32)
Calcium: 8.7 mg/dL — ABNORMAL LOW (ref 8.9–10.3)
Chloride: 102 mmol/L (ref 101–111)
Creatinine, Ser: 6.87 mg/dL — ABNORMAL HIGH (ref 0.61–1.24)
GFR calc Af Amer: 10 mL/min — ABNORMAL LOW (ref >60)
GFR calc non Af Amer: 8 mL/min — ABNORMAL LOW (ref >60)
Glucose, Bld: 88 mg/dL (ref 65–99)
Phosphorus: 4.7 mg/dL — ABNORMAL HIGH (ref 2.5–4.6)
Potassium: 3.8 mmol/L (ref 3.5–5.1)
Sodium: 136 mmol/L (ref 135–145)

## 2017-01-04 LAB — GLUCOSE, CAPILLARY
Glucose-Capillary: 90 mg/dL (ref 65–99)
Glucose-Capillary: 159 mg/dL — ABNORMAL HIGH (ref 65–99)

## 2017-01-04 LAB — PROTEIN, BODY FLUID (OTHER): Total Protein, Body Fluid Other: 4.4 g/dL

## 2017-01-04 MED ORDER — CHLORHEXIDINE GLUCONATE CLOTH 2 % EX PADS
6.0000 | MEDICATED_PAD | Freq: Every day | CUTANEOUS | Status: DC
  Administered 2017-01-05 – 2017-01-07 (×3): 6 via TOPICAL

## 2017-01-04 MED ORDER — OXYCODONE-ACETAMINOPHEN 5-325 MG PO TABS
ORAL_TABLET | ORAL | Status: AC
  Administered 2017-01-04: 1 via ORAL
  Filled 2017-01-04: qty 1

## 2017-01-04 MED ORDER — SODIUM CHLORIDE 0.9 % IV SOLN: 100.0000 mL | INTRAVENOUS | Status: DC | PRN

## 2017-01-04 MED ORDER — ACETAMINOPHEN 325 MG PO TABS
ORAL_TABLET | ORAL | Status: AC
  Administered 2017-01-04: 650 mg via ORAL
  Filled 2017-01-04: qty 2

## 2017-01-04 MED ORDER — MORPHINE SULFATE ER 15 MG PO TBCR
15.0000 mg | EXTENDED_RELEASE_TABLET | Freq: Two times a day (BID) | ORAL | Status: DC
  Administered 2017-01-04 – 2017-01-06 (×3): 15 mg via ORAL
  Filled 2017-01-04 (×4): qty 1

## 2017-01-04 MED ORDER — POLYETHYLENE GLYCOL 3350 17 G PO PACK: 17.0000 g | PACK | Freq: Every day | ORAL | Status: DC | PRN

## 2017-01-04 MED ORDER — HEPARIN SODIUM (PORCINE) 1000 UNIT/ML DIALYSIS: 1000.0000 [IU] | INTRAMUSCULAR | Status: DC | PRN

## 2017-01-04 MED ORDER — OXYCODONE HCL 5 MG PO TABS
ORAL_TABLET | ORAL | Status: AC
  Administered 2017-01-04: 5 mg via ORAL
  Filled 2017-01-04: qty 1

## 2017-01-04 MED ORDER — MUPIROCIN 2 % EX OINT
1.0000 "application " | TOPICAL_OINTMENT | Freq: Two times a day (BID) | CUTANEOUS | Status: DC
  Administered 2017-01-04 – 2017-01-07 (×7): 1 via NASAL
  Filled 2017-01-04 (×2): qty 22

## 2017-01-04 NOTE — Progress Notes (Signed)
[facility] Progress Note  Patient: James Burke XBJ:[id number]   PCP: [name], MD DOB: 22-Dec-1965   DOA: 12/29/2016   DOS: 01/04/2017   Date of Service: the patient was seen and examined on 01/04/2017  Subjective: feeling better, shortnes of breath is better. Still reports of lots of pain around his drain site on his chest. DIdnt sleep much last night due to it.   Brief hospital course: Pt. with PMH of  DM type II, Charcot's right foot, CHF class EF 35-40%, andCKD stage III; admitted on 12/29/2016, with complaint of shortnes of breath , was found to have acute on chronic combined CHF with pericardial effusion. Currently further plan is follow up on Echocardiogram and arrange for HD.  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 Drain in place. Pain control  - No ACE-I 2/2 renal insufficiency  Chest pain, Elevated troponin, Large pericardial effusion: Acute. 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 Posey Pronto.  Following up at [facility] also for possible transplant. Nephrology following.  Planning to arrange HD, ANCA elevated.  Vascular surgery has performed AVF creation and TDC placement on 5/1. Follow up with vascular outpatient in 4-6 weeks and check duplex prior to it for maturation of his fistula.  Plans for his first session of HD today.   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 - Hypoglycemic protocols - hold glipizide - CBGs with sensitive SSI  Hyperlipidemia:  Patient  reports no longer being on atorvastatin.  BPH - Continue Flomax   Leukocytosis - likely reactive, will cont to monitor.   Bowel regimen: last BM 01/01/2017 Diet: cardiac diet DVT Prophylaxis: subcutaneous Heparin  Advance goals of care discussion: full code  Family Communication: Wife at bedside  Disposition:  Discharge to home. Expected discharge date: TBD  Consultants: cardiology, vascular surgery, nephrology Procedures: Echocardiogram. Pericardiocentesis, AV fistula TDC placement.  Antibiotics: Anti-infectives    Start     Dose/Rate Route Frequency Ordered Stop   01/03/17 1130  ceFAZolin (ANCEF) IVPB 2g/100 mL premix  Status:  Discontinued    Comments:  Send with pt to OR   2 g 200 mL/hr over 30 Minutes Intravenous To [facility] 01/02/17 1541 01/03/17 1433   01/03/17 1130  ceFAZolin (ANCEF) IVPB 1 g/50 mL premix  Status:  Discontinued    Comments:  Send with pt to OR   1 g 100 mL/hr over 30 Minutes Intravenous To [facility] Surgical 01/02/17 1441 01/02/17 1543       Objective: Physical Exam: Vitals:   01/04/17 0600 01/04/17 0700 01/04/17 0800 01/04/17 0900  BP: 130/75 127/71 (!) 142/82 (!) 141/83  Pulse: 80 77 81 88  Resp: 15 20 18 19   Temp:  98.4 F (36.9 C)    TempSrc:  Oral    SpO2: 97% 95% 95% 94%  Weight: 88.3 kg (194 lb 10.7 oz)     Height:        Intake/Output Summary (Last 24 hours) at 01/04/17 1008 Last data filed at 01/04/17 0800  Gross per 24  hour  Intake              306 ml  Output              895 ml  Net             -589 ml   Filed Weights   01/03/17 0737 01/03/17 1641 01/04/17 0600  Weight: 89.4 kg (197 lb 1.6 oz) (!) 197.3 kg (434 lb 15.5 oz) 88.3 kg (194 lb 10.7 oz)   General: Alert, Awake and Oriented to Time, Place and Person. Appear in mild distress, affect appropriate Eyes: PERRL, Conjunctiva normal ENT: Oral Mucosa clear moist. Neck: no JVD, no Abnormal Mass Or lumps Cardiovascular: S1 and S2 Present, no  Murmur, Respiratory: Bilateral Air entry equal and Decreased, no use of accessory muscle, Clear to Auscultation, no Crackles, no wheezes Abdomen: Bowel Sound present, Soft and no tenderness Skin: no redness, no Rash, no induration Extremities: trace Pedal edema, no calf tenderness Neurologic: Grossly no focal neuro deficit. Bilaterally Equal motor strength  Data Reviewed: CBC:  Recent Labs Lab 12/30/16 0041 12/31/16 0241 01/01/17 0229 01/02/17 0314 01/04/17 0322  WBC 6.5 5.3 5.8 5.4 17.6*  NEUTROABS 5.0  --   --   --   --   HGB 10.9* 9.4* 10.5* 10.1* 10.9*  HCT 32.1* 28.6* 31.1* 30.6* 32.9*  MCV 81.3 80.8 81.4 81.0 81.4  PLT 200 166 179 173 240   Basic Metabolic Panel:  Recent Labs Lab 12/30/16 0041  12/30/16 0830 12/31/16 0241 01/01/17 0229 01/02/17 0314 01/03/17 0356 01/04/17 0322  NA  --   < >  --  136 137 138 136 136  K  --   < >  --  3.5 3.5 3.5 3.4* 3.8  CL  --   < >  --  100* 101 98* 101 102  CO2  --   < >  --  24 24 26 25  18*  GLUCOSE  --   < >  --  97 120* 96 84 88  BUN  --   < >  --  72* 74* 78* 78* 80*  CREATININE  --   < >  --  6.28* 6.30* 6.87* 6.81* 6.87*  CALCIUM  --   < >  --  8.6* 9.1 8.9 8.5* 8.7*  MG 2.1  --  2.2 2.1  --   --   --   --   PHOS  --   < > 3.7 4.5 4.7* 5.0* 5.1* 4.7*  < > = values in this interval not displayed.  Liver Function Tests:  Recent Labs Lab 12/30/16 1354 12/31/16 0241 01/01/17 0229 01/02/17 0314 01/03/17 0356 01/04/17 0322  AST 13*  --   --   --   --   --   ALT 14*  --   --   --   --   --   ALKPHOS 47  --   --   --   --   --   BILITOT 0.5  --   --   --   --   --   PROT 6.3*  --   --   --   --   --   ALBUMIN 3.4* 3.1* 3.4* 3.2* 3.1* 3.2*   No results for input(s): LIPASE, AMYLASE in the last 168 hours. No results for input(s): AMMONIA in the last 168 hours. Coagulation Profile:  Recent Labs Lab 01/03/17 0356  INR 1.15   Cardiac Enzymes:  Recent  Labs Lab 12/30/16 0041 12/30/16 0545 12/30/16 1354   TROPONINI 0.03* 0.04* 0.03*   BNP (last 3 results) No results for input(s): PROBNP in the last 8760 hours. CBG:  Recent Labs Lab 01/02/17 2222 01/03/17 0748 01/03/17 1332 01/03/17 2149 01/04/17 0813  GLUCAP 146* 106* 98 95 90   Studies: Dg Chest Port 1 View  Result Date: 01/03/2017 CLINICAL DATA:  Status post dialysis catheter placement. EXAM: PORTABLE CHEST 1 VIEW COMPARISON:  PA and lateral chest 06/04/2014. FINDINGS: Right IJ approach double lumen central venous catheter is in place. Tip of the catheter projects just within the right atrium. There is no pneumothorax. Cardiomegaly is seen. No pulmonary edema. No pleural effusion. IMPRESSION: Dialysis catheter tip projects just within the right atrium. Negative for pneumothorax. Cardiomegaly without edema. Electronically Signed   By: Inge Rise M.D.   On: 01/03/2017 14:01   Dg Fluoro Guide Cv Line-no Report  Result Date: 01/03/2017 Fluoroscopy was utilized by the requesting physician.  No radiographic interpretation.    Scheduled Meds: . amLODipine  10 mg Oral Daily  . aspirin EC  81 mg Oral Daily  . carvedilol  25 mg Oral BID WC  . Chlorhexidine Gluconate Cloth  6 each Topical Daily  . docusate sodium  100 mg Oral BID  . Dulaglutide  0.75 mg Subcutaneous Weekly  . furosemide  40 mg Intravenous Daily  . hydrALAZINE  25 mg Oral TID  . insulin aspart  0-5 Units Subcutaneous QHS  . insulin aspart  0-9 Units Subcutaneous TID WC  . mupirocin ointment  1 application Nasal BID  . potassium chloride SA  20 mEq Oral Daily  . sodium chloride flush  3 mL Intravenous Q12H  . sodium chloride flush  3 mL Intravenous Q12H  . tamsulosin  0.4 mg Oral BID   Continuous Infusions: . sodium chloride 500 mL (01/03/17 1018)  . sodium chloride    . sodium chloride     PRN Meds: sodium chloride, sodium chloride, acetaminophen, fentaNYL (SUBLIMAZE) injection, ondansetron (ZOFRAN) IV, oxyCODONE-acetaminophen **AND** oxyCODONE, sodium  chloride flush, sodium chloride flush, zolpidem  Time spent: 30 minutes  Author: Gerlean Ren, MD [facility] Pager: [phone] 01/04/2017 10:08 AM  If 7PM-7AM, please contact night-coverage at [email or web address], password [facility]

## 2017-01-04 NOTE — Progress Notes (Signed)
  Echocardiogram 2D Echocardiogram Limited has been performed.  Darlina Sicilian M 01/04/2017, 10:41 AM

## 2017-01-04 NOTE — Care Management Note (Signed)
Case Management Note Original Note Created Olga Coaster RN, CM  Patient Details  Name: James Burke MRN: [id number] Date of Birth: 1965/09/22  Subjective/Objective:   Admitted with CHF                Action/Plan: Patient lives with spouse, PCP: [name], MD; has private insurance with BCBS with prescription drug coverage; CM following for DCP  Expected Discharge Date:                 Expected Discharge Plan:  Home/Self Care  Discharge planning Services  CM Consult  In-House Referral:     Discharge planning Services  CM Consult  Post Acute Care Choice:    Choice offered to:     DME Arranged:    DME Agency:     HH Arranged:    [place] Agency:     Status of Service:  In process, will continue to follow  If discussed at Long Length of Stay Meetings, dates discussed:  5/3    Additional Comments:  01/04/17- 1515- James Cheema RN, CM- pt now on 2H, with plans to start HD today- per renal note pt now ESRD and clipping process has been started for outpt HD spot.  CM will continue to follow for d/c needs  Dawayne Patricia, RN 01/04/2017, 3:13 PM [phone]

## 2017-01-04 NOTE — Progress Notes (Signed)
Report received from HD RN [facility] via telephone. Pt returned to 2H22 with transporter Lajean Saver; RN and family at bedside. Vitals stable. Will continue to monitor pt closely.

## 2017-01-04 NOTE — Progress Notes (Signed)
Progress Note  Patient Name: James Burke Date of Encounter: 01/04/2017  Primary Cardiologist: Stanford Breed  Subjective   Had 1L pericardial fluid drained, without any change in hemodynamics afterwards. Drained 60 mL last 6 hours. He is having a lot of pleuritic pain. HD to start this afternoon.  Inpatient Medications    Scheduled Meds: . amLODipine  10 mg Oral Daily  . aspirin EC  81 mg Oral Daily  . carvedilol  25 mg Oral BID WC  . Chlorhexidine Gluconate Cloth  6 each Topical Daily  . docusate sodium  100 mg Oral BID  . Dulaglutide  0.75 mg Subcutaneous Weekly  . furosemide  40 mg Intravenous Daily  . hydrALAZINE  25 mg Oral TID  . insulin aspart  0-5 Units Subcutaneous QHS  . insulin aspart  0-9 Units Subcutaneous TID WC  . morphine  15 mg Oral Q12H  . mupirocin ointment  1 application Nasal BID  . potassium chloride SA  20 mEq Oral Daily  . sodium chloride flush  3 mL Intravenous Q12H  . sodium chloride flush  3 mL Intravenous Q12H  . tamsulosin  0.4 mg Oral BID   Continuous Infusions: . sodium chloride 500 mL (01/03/17 1018)  . sodium chloride    . sodium chloride     PRN Meds: sodium chloride, sodium chloride, acetaminophen, ondansetron (ZOFRAN) IV, oxyCODONE-acetaminophen **AND** oxyCODONE, polyethylene glycol, sodium chloride flush, sodium chloride flush, zolpidem   Vital Signs    Vitals:   01/04/17 0900 01/04/17 1000 01/04/17 1100 01/04/17 1200  BP: (!) 141/83 122/78 111/67 114/70  Pulse: 88 77 72 73  Resp: 19 17 15 15   Temp:   98 F (36.7 C)   TempSrc:   Oral   SpO2: 94% 94% 94% 95%  Weight:      Height:        Intake/Output Summary (Last 24 hours) at 01/04/17 1337 Last data filed at 01/04/17 0800  Gross per 24 hour  Intake                6 ml  Output              845 ml  Net             -839 ml   Filed Weights   01/03/17 0737 01/03/17 1641 01/04/17 0600  Weight: 89.4 kg (197 lb 1.6 oz) (!) 197.3 kg (434 lb 15.5 oz) 88.3 kg (194 lb 10.7 oz)      Telemetry    NSR - Personally Reviewed  Physical Exam  Tearful, hurting, short shallow breaths GEN: No acute distress.   Neck: No JVD R IJ HD cath looks healthy Cardiac: RRR, no murmurs, rubs, or gallops. Pericardial drain without surrounding erythema  Respiratory: Clear to auscultation bilaterally. GI: Soft, nontender, non-distended  MS: No edema; No deformity. Neuro:  Nonfocal  Psych: Normal affect   Labs    Chemistry Recent Labs Lab 12/30/16 1354  01/02/17 0314 01/03/17 0356 01/04/17 0322  NA  --   < > 138 136 136  K  --   < > 3.5 3.4* 3.8  CL  --   < > 98* 101 102  CO2  --   < > 26 25 18*  GLUCOSE  --   < > 96 84 88  BUN  --   < > 78* 78* 80*  CREATININE  --   < > 6.87* 6.81* 6.87*  CALCIUM  --   < >  8.9 8.5* 8.7*  PROT 6.3*  --   --   --   --   ALBUMIN 3.4*  < > 3.2* 3.1* 3.2*  AST 13*  --   --   --   --   ALT 14*  --   --   --   --   ALKPHOS 47  --   --   --   --   BILITOT 0.5  --   --   --   --   GFRNONAA  --   < > 8* 9* 8*  GFRAA  --   < > 10* 10* 10*  ANIONGAP  --   < > 14 10 16*  < > = values in this interval not displayed.   Hematology Recent Labs Lab 01/01/17 0229 01/02/17 0314 01/04/17 0322  WBC 5.8 5.4 17.6*  RBC 3.82* 3.78* 4.04*  HGB 10.5* 10.1* 10.9*  HCT 31.1* 30.6* 32.9*  MCV 81.4 81.0 81.4  MCH 27.5 26.7 27.0  MCHC 33.8 33.0 33.1  RDW 14.2 13.9 14.0  PLT 179 173 194    Cardiac Enzymes Recent Labs Lab 12/30/16 0041 12/30/16 0545 12/30/16 1354  TROPONINI 0.03* 0.04* 0.03*   No results for input(s): TROPIPOC in the last 168 hours.   BNP Recent Labs Lab 12/30/16 0042  BNP 2,695.8*     DDimer No results for input(s): DDIMER in the last 168 hours.   Radiology    Dg Chest Port 1 View  Result Date: 01/03/2017 CLINICAL DATA:  Status post dialysis catheter placement. EXAM: PORTABLE CHEST 1 VIEW COMPARISON:  PA and lateral chest 06/04/2014. FINDINGS: Right IJ approach double lumen central venous catheter is in place. Tip  of the catheter projects just within the right atrium. There is no pneumothorax. Cardiomegaly is seen. No pulmonary edema. No pleural effusion. IMPRESSION: Dialysis catheter tip projects just within the right atrium. Negative for pneumothorax. Cardiomegaly without edema. Electronically Signed   By: Inge Rise M.D.   On: 01/03/2017 14:01   Dg Fluoro Guide Cv Line-no Report  Result Date: 01/03/2017 Fluoroscopy was utilized by the requesting physician.  No radiographic interpretation.    Cardiac Studies   Repeat echo interpreted as moderate, mostly posterior and lateral effusion, possibly loculated. On my review, effusion is small. There is no tamponade. EF remains 25-30%.  Patient Profile     51 y.o. 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 Presented with acute CHF exacerbation, large pericardial effusion and worsened renal function. No clear tamponade, but decision was made to drain the effusion due to impending need to initiate HD and concern about circulatory instability with volume removal.  Assessment & Plan    1. Acute on chronic systolic and diastolic heart failure Marked improvement. To start intensive HD today, which will change need for diuretics. On high dose carvedilol and hydralazine. Will ask Nephrology opinion re: use of ARB/entresto since it seems he has progressed to ESRD.  2. Pericardial effusion Although there is still substantial drainage, whatever fluid is left may be loculated and the catheter is causing severe pain. Removed the catheter with immediate relief of pain. If fluid re-accumulates, will need a pericardial window.  3. CKD stage IV It does not appear that draining the effusion had any hemodynamic impact. He was not in tamponade. Therefore do not anticipate a major improvement in rena function after the pericardiocentesis. Still unclear whether his renal function is bad  enough to cause uremic pericarditis - he really does not have other uremic symptoms.  4. DM -Hgb A1c 5.2 on 10/26/16 -Management per IM  5. HLD -LDL 63, 12/30/16. At goal on atorvastatin 10 mg daily  6. Hypertension -On amlodipine, carvedilol, lasix, hydralazine. Add ARB (if we think he will stay ESRD) instead of amlodipine?   Signed, [name], MD  01/04/2017, 1:37 PM

## 2017-01-04 NOTE — Anesthesia Postprocedure Evaluation (Addendum)
Anesthesia Post Note  Patient: James Burke  Procedure(s) Performed: Procedure(s) (LRB): INSERTION OF DIALYSIS CATHETER RIGHT INTERNAL JUGULAR (Right) FIRST STAGE BRACHIAL VEIN TRANSPOSITION (Left)  Patient location during evaluation: PACU Anesthesia Type: MAC Level of consciousness: awake and alert Pain management: pain level controlled Vital Signs Assessment: post-procedure vital signs reviewed and stable Respiratory status: spontaneous breathing, nonlabored ventilation, respiratory function stable and patient connected to nasal cannula oxygen Cardiovascular status: stable and blood pressure returned to baseline Anesthetic complications: no       Last Vitals:  Vitals:   01/04/17 0800 01/04/17 0900  BP: (!) 142/82 (!) 141/83  Pulse: 81 88  Resp: 18 19  Temp:      Last Pain:  Vitals:   01/04/17 0845  TempSrc:   PainSc: Asleep                 Cowan Pilar

## 2017-01-04 NOTE — Telephone Encounter (Signed)
Sched appt 02/03/17 at 12:30. Lm on hm# to inform pt and for pt to confirm appt.

## 2017-01-04 NOTE — Telephone Encounter (Signed)
-----   Message from Mena Goes, RN sent at 01/04/2017  9:23 AM EDT ----- Regarding: 4-6 weeks   ----- Message ----- From: Alvia Grove, PA-C Sent: 01/04/2017   8:29 AM To: Vvs Charge Pool  s/p left 1st stage brachial vein transposition 01/03/17  f/u with Dr Bridgett Larsson only in 4-6 weeks. No duplex  Thanks Maudie Mercury

## 2017-01-04 NOTE — Progress Notes (Signed)
  [facility] Progress Note  Subjective  - POD #1  No hand pain or numbness. Some soreness with incision.   Objective Vitals:   01/04/17 0600 01/04/17 0700  BP: 130/75   Pulse: 80   Resp: 15   Temp:  98.4 F (36.9 C)    Intake/Output Summary (Last 24 hours) at 01/04/17 0824 Last data filed at 01/04/17 0400  Gross per 24 hour  Intake              306 ml  Output              835 ml  Net             -529 ml   Palpable thrill at left antecubital space. Left antecubital incision intact without hematoma 2+ left radial and ulnar pulses. Right IJ TDC in place  Assessment/Planning: 51 y.o. male is s/p: left first stage brachial vein transposition, right IJ TDC placement 1 Day Post-Op   Fistula is patent. No steal symptoms. F/u in 4-6 weeks with duplex to check maturation of fistula. Will sign off.   Alvia Grove 01/04/2017 8:24 AM --  Laboratory CBC    Component Value Date/Time   WBC 17.6 (H) 01/04/2017 0322   HGB 10.9 (L) 01/04/2017 0322   HCT 32.9 (L) 01/04/2017 0322   PLT 194 01/04/2017 0322    BMET    Component Value Date/Time   NA 136 01/04/2017 0322   NA 138 03/04/2015   K 3.8 01/04/2017 0322   K 4.4 04/16/2014   CL 102 01/04/2017 0322   CL 96 03/02/2015   CO2 18 (L) 01/04/2017 0322   GLUCOSE 88 01/04/2017 0322   BUN 80 (H) 01/04/2017 0322   BUN 44 (A) 10/27/2015   CREATININE 6.87 (H) 01/04/2017 0322   CREATININE 2.46 (H) 06/20/2014 0914   CALCIUM 8.7 (L) 01/04/2017 0322   CALCIUM 9.4 03/02/2015   CALCIUM 10.0 08/21/2014   GFRNONAA 8 (L) 01/04/2017 0322   GFRNONAA 19 03/02/2015   GFRNONAA 30 (L) 06/20/2014 0914   GFRAA 10 (L) 01/04/2017 0322   GFRAA 35 (L) 06/20/2014 0914    COAG Lab Results  Component Value Date   INR 1.15 01/03/2017   No results found for: PTT  Antibiotics Anti-infectives    Start     Dose/Rate Route Frequency Ordered Stop   01/03/17 1130  ceFAZolin (ANCEF) IVPB 2g/100 mL premix  Status:   Discontinued    Comments:  Send with pt to OR   2 g 200 mL/hr over 30 Minutes Intravenous To [facility] 01/02/17 1541 01/03/17 1433   01/03/17 1130  ceFAZolin (ANCEF) IVPB 1 g/50 mL premix  Status:  Discontinued    Comments:  Send with pt to OR   1 g 100 mL/hr over 30 Minutes Intravenous To [facility] Surgical 01/02/17 1441 01/02/17 [place], PA-C [facility] Office: [phone] Pager: [phone] 01/04/2017 8:24 AM

## 2017-01-04 NOTE — Progress Notes (Signed)
[facility] Progress Note    Assessment/ Plan:   AKI/progressive CKD-4: Likely secondary to ischemic insult to his kidney from pericardial effusion and worsening systolic heart failure. Renal ultrasound normal. Work-up negative thus far except for equivocal ANCA at 4.0. UPC 8.96. UOP 950 mL. Net -1L/24hrs. -4.7 L since admit. Serum Cr and BUN stable. No uremic symptoms. Tunneled catheter and L brachioulnar graft inserted yesterday.   Begin HD today   Daily renal function  Begin CLIP process for continued outpatient dialysis after discharge  Severe pericardial effusion: CT chest on 4/[facility] at [facility] was read as moderately large pericardial effusion. Echo 4/28 with severe pericardial effusion, consistent with pre-tamponade state. Repeat echo 4/30 with persistent large pericardial effusion with dilated IVC and early RV collapse suggestive of tamponade. Pericardiocentesis performed 37/90 without complications.   Continue management per cardiology  HFrEF:  Echo CHO 4/28 with EF 15-20% and diffuse hypokinesis (35-40% wtih G1DD in 2016). Repeat echo 4/30 with EF improved to 35%. No cardiopulmonary symptoms this morning.   Plan per Cardiology  Continue Coreg  DM-2: Well-controlled.   Per primary  Subjective:    Pericardiocentesis and R IJ tunneled dialysis catheter placement yesterday, as well as L brachioulnar fistula placement. Patient reporting significant but improved pain at site of pericardiocentesis. Also reporting pain with deep inspiration and says he is having shortness of breath when speaking.    Objective:   BP 130/75 (BP Location: Right Arm)   Pulse 80   Temp 98.4 F (36.9 C) (Oral)   Resp 15   Ht 6' (1.829 m)   Wt 88.3 kg (194 lb 10.7 oz)   SpO2 97%   BMI 26.40 kg/m   Intake/Output Summary (Last 24 hours) at 01/04/17 0845 Last data filed at 01/04/17 0800  Gross per 24 hour  Intake              306 ml  Output              895  ml  Net             -589 ml   Weight change:   Physical Exam: GEN: Lying in bed in NAD. Speaking softly.  CVS: RRR, no MRG appreciated RESP: CTAB, grimaces with deep inspiration.  GI: +BS, soft, non-tender, non-distended MSK: Right lower extremity wasting with right foot deformity from Charcot's disease; no LE edema  Imaging: Dg Chest Port 1 View  Result Date: 01/03/2017 CLINICAL DATA:  Status post dialysis catheter placement. EXAM: PORTABLE CHEST 1 VIEW COMPARISON:  PA and lateral chest 06/04/2014. FINDINGS: Right IJ approach double lumen central venous catheter is in place. Tip of the catheter projects just within the right atrium. There is no pneumothorax. Cardiomegaly is seen. No pulmonary edema. No pleural effusion. IMPRESSION: Dialysis catheter tip projects just within the right atrium. Negative for pneumothorax. Cardiomegaly without edema. Electronically Signed   By: Inge Rise M.D.   On: 01/03/2017 14:01   Dg Fluoro Guide Cv Line-no Report  Result Date: 01/03/2017 Fluoroscopy was utilized by the requesting physician.  No radiographic interpretation.    Labs: BMET  Recent Labs Lab 12/30/16 0545 12/30/16 0830 12/31/16 0241 01/01/17 0229 01/02/17 0314 01/03/17 0356 01/04/17 0322  NA 138  --  136 137 138 136 136  K 3.6  --  3.5 3.5 3.5 3.4* 3.8  CL 103  --  100* 101 98* 101 102  CO2 24  --  24 24 26 25  18*  GLUCOSE 85  --  97 120* 96 84 88  BUN 66*  --  72* 74* 78* 78* 80*  CREATININE 6.05*  --  6.28* 6.30* 6.87* 6.81* 6.87*  CALCIUM 8.9  --  8.6* 9.1 8.9 8.5* 8.7*  PHOS  --  3.7 4.5 4.7* 5.0* 5.1* 4.7*   CBC  Recent Labs Lab 12/30/16 0041 12/31/16 0241 01/01/17 0229 01/02/17 0314 01/04/17 0322  WBC 6.5 5.3 5.8 5.4 17.6*  NEUTROABS 5.0  --   --   --   --   HGB 10.9* 9.4* 10.5* 10.1* 10.9*  HCT 32.1* 28.6* 31.1* 30.6* 32.9*  MCV 81.3 80.8 81.4 81.0 81.4  PLT 200 166 179 173 194    Medications:    . amLODipine  10 mg Oral Daily  . aspirin EC  81  mg Oral Daily  . carvedilol  25 mg Oral BID WC  . docusate sodium  100 mg Oral BID  . Dulaglutide  0.75 mg Subcutaneous Weekly  . furosemide  40 mg Intravenous Daily  . hydrALAZINE  25 mg Oral TID  . insulin aspart  0-5 Units Subcutaneous QHS  . insulin aspart  0-9 Units Subcutaneous TID WC  . potassium chloride SA  20 mEq Oral Daily  . sodium chloride flush  3 mL Intravenous Q12H  . sodium chloride flush  3 mL Intravenous Q12H  . tamsulosin  0.4 mg Oral BID    Adin Hector, MD PGY-2 01/04/17  8:45 AM

## 2017-01-05 LAB — CBC
HCT: 31.4 % — ABNORMAL LOW (ref 39.0–52.0)
Hemoglobin: 10.4 g/dL — ABNORMAL LOW (ref 13.0–17.0)
MCH: 26.8 pg (ref 26.0–34.0)
MCHC: 33.1 g/dL (ref 30.0–36.0)
MCV: 80.9 fL (ref 78.0–100.0)
Platelets: 158 10*3/uL (ref 150–400)
RBC: 3.88 MIL/uL — ABNORMAL LOW (ref 4.22–5.81)
RDW: 14.2 % (ref 11.5–15.5)
WBC: 11 10*3/uL — ABNORMAL HIGH (ref 4.0–10.5)

## 2017-01-05 LAB — GLUCOSE, CAPILLARY
Glucose-Capillary: 138 mg/dL — ABNORMAL HIGH (ref 65–99)
Glucose-Capillary: 162 mg/dL — ABNORMAL HIGH (ref 65–99)
Glucose-Capillary: 211 mg/dL — ABNORMAL HIGH (ref 65–99)
Glucose-Capillary: 226 mg/dL — ABNORMAL HIGH (ref 65–99)

## 2017-01-05 LAB — HEPATITIS B SURFACE ANTIBODY,QUALITATIVE: Hep B S Ab: NONREACTIVE

## 2017-01-05 LAB — RENAL FUNCTION PANEL
Albumin: 2.6 g/dL — ABNORMAL LOW (ref 3.5–5.0)
Anion gap: 12 (ref 5–15)
BUN: 55 mg/dL — ABNORMAL HIGH (ref 6–20)
CO2: 23 mmol/L (ref 22–32)
Calcium: 8.1 mg/dL — ABNORMAL LOW (ref 8.9–10.3)
Chloride: 97 mmol/L — ABNORMAL LOW (ref 101–111)
Creatinine, Ser: 5.65 mg/dL — ABNORMAL HIGH (ref 0.61–1.24)
GFR calc Af Amer: 12 mL/min — ABNORMAL LOW (ref >60)
GFR calc non Af Amer: 11 mL/min — ABNORMAL LOW (ref >60)
Glucose, Bld: 147 mg/dL — ABNORMAL HIGH (ref 65–99)
Phosphorus: 4.4 mg/dL (ref 2.5–4.6)
Potassium: 3.8 mmol/L (ref 3.5–5.1)
Sodium: 132 mmol/L — ABNORMAL LOW (ref 135–145)

## 2017-01-05 LAB — FERRITIN: Ferritin: 111 ng/mL (ref 24–336)

## 2017-01-05 LAB — PROTIME-INR
INR: 1.48
Prothrombin Time: 18 seconds — ABNORMAL HIGH (ref 11.4–15.2)

## 2017-01-05 LAB — MAGNESIUM: Magnesium: 2.1 mg/dL (ref 1.7–2.4)

## 2017-01-05 MED ORDER — ATORVASTATIN CALCIUM 10 MG PO TABS
10.0000 mg | ORAL_TABLET | Freq: Every day | ORAL | Status: DC
  Administered 2017-01-05: 10 mg via ORAL
  Filled 2017-01-05 (×3): qty 1

## 2017-01-05 MED ORDER — OXYCODONE HCL 5 MG PO TABS
ORAL_TABLET | ORAL | Status: AC
  Administered 2017-01-05: 5 mg via ORAL
  Filled 2017-01-05: qty 1

## 2017-01-05 MED ORDER — OXYCODONE-ACETAMINOPHEN 5-325 MG PO TABS
ORAL_TABLET | ORAL | Status: AC
  Administered 2017-01-05: 1 via ORAL
  Filled 2017-01-05: qty 1

## 2017-01-05 NOTE — Progress Notes (Signed)
[facility] Progress Note  Patient: James Burke PXT:[id number]   PCP: [name], MD DOB: Oct 25, 1965   DOA: 12/29/2016   DOS: 01/05/2017   Date of Service: the patient was seen and examined on 01/05/2017  Subjective:  Pericardial drain removed yesterday. His pain his much better but still have trouble taking deep breaths and talking in long sentences.   Brief hospital course: Pt. with PMH of  DM type II, Charcot's right foot, CHF class EF 35-40%, andCKD stage III; admitted on 12/29/2016, with complaint of shortnes of breath , was found to have acute on chronic combined CHF with pericardial effusion.   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 Defer to cardiology.  - No ACE-I 2/2 renal insufficiency  Chest pain, Elevated troponin, Large pericardial effusion: Acute. 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 Posey Pronto.  Following up at [facility] also for possible transplant. Nephrology following.  ANCA elevated  Tolerating HD well.  Vascular surgery has performed AVF creation and TDC placement on 5/1. Follow up with vascular outpatient in 4-6 weeks and check duplex prior to it for maturation of his fistula.   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 - Hypoglycemic protocols - hold glipizide - CBGs with sensitive SSI  Hyperlipidemia:  Patient reports no longer being on atorvastatin.  BPH - Continue Flomax   Leukocytosis - resolved   Diet: cardiac diet DVT  Prophylaxis: subcutaneous Heparin  Advance goals of care discussion: full code  Family Communication: None. Patient comprehends well.   Disposition:  Discharge to home. Expected discharge date: TBD  Consultants: cardiology, vascular surgery, nephrology Procedures: Echocardiogram. Pericardiocentesis, AV fistula TDC placement.  Antibiotics: Anti-infectives    Start     Dose/Rate Route Frequency Ordered Stop   01/03/17 1130  ceFAZolin (ANCEF) IVPB 2g/100 mL premix  Status:  Discontinued    Comments:  Send with pt to OR   2 g 200 mL/hr over 30 Minutes Intravenous To [facility] 01/02/17 1541 01/03/17 1433   01/03/17 1130  ceFAZolin (ANCEF) IVPB 1 g/50 mL premix  Status:  Discontinued    Comments:  Send with pt to OR   1 g 100 mL/hr over 30 Minutes Intravenous To [facility] 01/02/17 1441 01/02/17 1543       Objective: Physical Exam: Vitals:   01/05/17 1200 01/05/17 1230 01/05/17 1300 01/05/17 1312  BP: 112/71 113/76 125/82 123/74  Pulse: 67 67 69 71  Resp:    19  Temp:    97.8 F (36.6 C)  TempSrc:    Oral  SpO2:    97%  Weight:    81.4 kg (179 lb 7.3 oz)  Height:        Intake/Output Summary (Last 24 hours) at 01/05/17 1442 Last data filed at 01/05/17 1312  Gross per 24 hour  Intake              480 ml  Output             5400  ml  Net            -4920 ml   Filed Weights   01/05/17 0350 01/05/17 1030 01/05/17 1312  Weight: 83.1 kg (183 lb 1.6 oz) 84.7 kg (186 lb 11.7 oz) 81.4 kg (179 lb 7.3 oz)   General: Alert, Awake and Oriented to Time, Place and Person. Appear in mild distress, affect appropriate Eyes: PERRL, Conjunctiva normal ENT: Oral Mucosa clear moist. Neck: no JVD, no Abnormal Mass Or lumps Cardiovascular: S1 and S2 Present, no Murmur, Respiratory: Bilateral Air entry equal and Decreased, no use of accessory muscle, Clear to Auscultation, no Crackles, no wheezes Abdomen: Bowel Sound present, Soft and no tenderness Skin: no redness, no  Rash, no induration Extremities: trace Pedal edema, no calf tenderness Neurologic: Grossly no focal neuro deficit. Bilaterally Equal motor strength No signs of infection at the catheter site  Data Reviewed: CBC:  Recent Labs Lab 12/30/16 0041 12/31/16 0241 01/01/17 0229 01/02/17 0314 01/04/17 0322 01/05/17 0453  WBC 6.5 5.3 5.8 5.4 17.6* 11.0*  NEUTROABS 5.0  --   --   --   --   --   HGB 10.9* 9.4* 10.5* 10.1* 10.9* 10.4*  HCT 32.1* 28.6* 31.1* 30.6* 32.9* 31.4*  MCV 81.3 80.8 81.4 81.0 81.4 80.9  PLT 200 166 179 173 194 572   Basic Metabolic Panel:  Recent Labs Lab 12/30/16 0041  12/30/16 0830 12/31/16 0241 01/01/17 0229 01/02/17 0314 01/03/17 0356 01/04/17 0322 01/05/17 0453  NA  --   < >  --  136 137 138 136 136 132*  K  --   < >  --  3.5 3.5 3.5 3.4* 3.8 3.8  CL  --   < >  --  100* 101 98* 101 102 97*  CO2  --   < >  --  24 24 26 25  18* 23  GLUCOSE  --   < >  --  97 120* 96 84 88 147*  BUN  --   < >  --  72* 74* 78* 78* 80* 55*  CREATININE  --   < >  --  6.28* 6.30* 6.87* 6.81* 6.87* 5.65*  CALCIUM  --   < >  --  8.6* 9.1 8.9 8.5* 8.7* 8.1*  MG 2.1  --  2.2 2.1  --   --   --   --  2.1  PHOS  --   < > 3.7 4.5 4.7* 5.0* 5.1* 4.7* 4.4  < > = values in this interval not displayed.  Liver Function Tests:  Recent Labs Lab 12/30/16 1354  01/01/17 0229 01/02/17 0314 01/03/17 0356 01/04/17 0322 01/05/17 0453  AST 13*  --   --   --   --   --   --   ALT 14*  --   --   --   --   --   --   ALKPHOS 47  --   --   --   --   --   --   BILITOT 0.5  --   --   --   --   --   --   PROT 6.3*  --   --   --   --   --   --   ALBUMIN 3.4*  < > 3.4* 3.2* 3.1* 3.2* 2.6*  < > = values in this interval not displayed. No results for input(s): LIPASE, AMYLASE in the last 168 hours. No results for input(s): AMMONIA in  the last 168 hours. Coagulation Profile:  Recent Labs Lab 01/03/17 0356 01/05/17 0453  INR 1.15 1.48   Cardiac Enzymes:  Recent Labs Lab 12/30/16 0041  12/30/16 0545 12/30/16 1354  TROPONINI 0.03* 0.04* 0.03*   BNP (last 3 results) No results for input(s): PROBNP in the last 8760 hours. CBG:  Recent Labs Lab 01/03/17 2149 01/04/17 0813 01/04/17 1204 01/05/17 0839 01/05/17 1405  GLUCAP 95 90 159* 138* 162*   Studies: No results found.  Scheduled Meds: . amLODipine  10 mg Oral Daily  . aspirin EC  81 mg Oral Daily  . atorvastatin  10 mg Oral q1800  . carvedilol  25 mg Oral BID WC  . Chlorhexidine Gluconate Cloth  6 each Topical Daily  . docusate sodium  100 mg Oral BID  . Dulaglutide  0.75 mg Subcutaneous Weekly  . furosemide  40 mg Intravenous Daily  . hydrALAZINE  25 mg Oral TID  . insulin aspart  0-5 Units Subcutaneous QHS  . insulin aspart  0-9 Units Subcutaneous TID WC  . morphine  15 mg Oral Q12H  . mupirocin ointment  1 application Nasal BID  . potassium chloride SA  20 mEq Oral Daily  . sodium chloride flush  3 mL Intravenous Q12H  . sodium chloride flush  3 mL Intravenous Q12H  . tamsulosin  0.4 mg Oral BID   Continuous Infusions: . sodium chloride 500 mL (01/03/17 1018)  . sodium chloride    . sodium chloride    . sodium chloride    . sodium chloride     PRN Meds: sodium chloride, sodium chloride, sodium chloride, sodium chloride, acetaminophen, heparin, ondansetron (ZOFRAN) IV, oxyCODONE-acetaminophen **AND** oxyCODONE, polyethylene glycol, sodium chloride flush, sodium chloride flush, zolpidem  Time spent: 30 minutes  Author: Gerlean Ren, MD [facility] Pager: [phone] 01/05/2017 2:42 PM  If 7PM-7AM, please contact night-coverage at [email or web address], password [email or web address]

## 2017-01-05 NOTE — Progress Notes (Signed)
Report given to HD RN over the phone. Pt will be transported to HD with RN/transporter and on tele. Vitals stable, pt and his wife updated on plan.

## 2017-01-05 NOTE — Procedures (Signed)
I was present at this dialysis session. I have reviewed the session itself and made appropriate changes.   Filed Weights   01/04/17 1856 01/05/17 0350 01/05/17 1030  Weight: 85.8 kg (189 lb 2.5 oz) 83.1 kg (183 lb 1.6 oz) 84.7 kg (186 lb 11.7 oz)     Recent Labs Lab 01/05/17 0453  NA 132*  K 3.8  CL 97*  CO2 23  GLUCOSE 147*  BUN 55*  CREATININE 5.65*  CALCIUM 8.1*  PHOS 4.4     Recent Labs Lab 12/30/16 0041  01/02/17 0314 01/04/17 0322 01/05/17 0453  WBC 6.5  < > 5.4 17.6* 11.0*  NEUTROABS 5.0  --   --   --   --   HGB 10.9*  < > 10.1* 10.9* 10.4*  HCT 32.1*  < > 30.6* 32.9* 31.4*  MCV 81.3  < > 81.0 81.4 80.9  PLT 200  < > 173 194 158  < > = values in this interval not displayed.  Scheduled Meds: . amLODipine  10 mg Oral Daily  . aspirin EC  81 mg Oral Daily  . atorvastatin  10 mg Oral q1800  . carvedilol  25 mg Oral BID WC  . Chlorhexidine Gluconate Cloth  6 each Topical Daily  . docusate sodium  100 mg Oral BID  . Dulaglutide  0.75 mg Subcutaneous Weekly  . furosemide  40 mg Intravenous Daily  . hydrALAZINE  25 mg Oral TID  . insulin aspart  0-5 Units Subcutaneous QHS  . insulin aspart  0-9 Units Subcutaneous TID WC  . morphine  15 mg Oral Q12H  . mupirocin ointment  1 application Nasal BID  . potassium chloride SA  20 mEq Oral Daily  . sodium chloride flush  3 mL Intravenous Q12H  . sodium chloride flush  3 mL Intravenous Q12H  . tamsulosin  0.4 mg Oral BID   Continuous Infusions: . sodium chloride 500 mL (01/03/17 1018)  . sodium chloride    . sodium chloride    . sodium chloride    . sodium chloride     PRN Meds:.sodium chloride, sodium chloride, sodium chloride, sodium chloride, acetaminophen, heparin, ondansetron (ZOFRAN) IV, oxyCODONE-acetaminophen **AND** oxyCODONE, polyethylene glycol, sodium chloride flush, sodium chloride flush, zolpidem   Pearson Grippe  MD 01/05/2017, 11:28 AM

## 2017-01-05 NOTE — Progress Notes (Signed)
[facility] Progress Note    Assessment/ Plan:   AKI/progressive CKD-4: Likely secondary to ischemic insult to his kidney from pericardial effusion and worsening systolic heart failure. Renal ultrasound normal. Work-up negative thus far except for equivocal ANCA at 4.0. UPC 8.96. UOP 900 mL. Net -2.5L/24hrs. -7 L since admit. Serum Cr (6.8>5.6) and BUN (55>80) improving after HD. No uremic symptoms. Tunneled catheter and L brachioulnar graft inserted 05/01. Started HD yesterday.    Continue HD  Daily renal function  CLIP process started  Severe pericardial effusion: CT chest on 4/[facility] was read as moderately large pericardial effusion. Echo 4/28 with severe pericardial effusion, consistent with pre-tamponade state. Repeat echo 4/30 with persistent large pericardial effusion with dilated IVC and early RV collapse suggestive of tamponade. Pericardiocentesis performed 69/62 without complications with 1L pericardial fluid drained. Subsequent echo on 05/052 with EF 25-50% and moderate pericardial effusion but no evidence of tamponade. Additional drainage continues.   Continue management per cardiology  HFrEF:  Echo CHO 4/28 with EF 15-20% and diffuse hypokinesis (35-40% wtih G1DD in 2016). Repeat echo 4/30 with EF improved to 35%. No cardiopulmonary symptoms this morning.   Plan per Cardiology  Continue Coreg  DM-2: Well-controlled.   Per primary  Subjective:    Patient started HD yesterday. Reported continued chest pain at site of pericardiocentesis and pain with deep inspiration. Still SOB when talking because difficulty taking deep breaths. No complaints otherwise.    Objective:   BP 137/83   Pulse 78   Temp 98 F (36.7 C) (Oral)   Resp (!) 21   Ht 6' (1.829 m)   Wt 83.1 kg (183 lb 1.6 oz)   SpO2 94%   BMI 24.83 kg/m   Intake/Output Summary (Last 24 hours) at 01/05/17 0853 Last data filed at 01/05/17 0600  Gross per 24 hour   Intake              720 ml  Output             3400 ml  Net            -2680 ml   Weight change: -1.104 kg (-2 lb 6.9 oz)  Physical Exam:  GEN: Lying in bed in NAD.   CVS: RRR, no MRG appreciated RESP: CTAB, able to speak in full sentences without difficulty, normal WOB on RA  GI: +BS, soft, non-tender, non-distended MSK: Right lower extremity wasting with right foot deformity from Charcot's disease; no LE edema  Imaging: Dg Chest Port 1 View  Result Date: 01/03/2017 CLINICAL DATA:  Status post dialysis catheter placement. EXAM: PORTABLE CHEST 1 VIEW COMPARISON:  PA and lateral chest 06/04/2014. FINDINGS: Right IJ approach double lumen central venous catheter is in place. Tip of the catheter projects just within the right atrium. There is no pneumothorax. Cardiomegaly is seen. No pulmonary edema. No pleural effusion. IMPRESSION: Dialysis catheter tip projects just within the right atrium. Negative for pneumothorax. Cardiomegaly without edema. Electronically Signed   By: Inge Rise M.D.   On: 01/03/2017 14:01   Dg Fluoro Guide Cv Line-no Report  Result Date: 01/03/2017 Fluoroscopy was utilized by the requesting physician.  No radiographic interpretation.    Labs: BMET  Recent Labs Lab 12/30/16 0545 12/30/16 0830 12/31/16 0241 01/01/17 0229 01/02/17 0314 01/03/17 0356 01/04/17 0322 01/05/17 0453  NA 138  --  136 137 138 136 136 132*  K 3.6  --  3.5 3.5 3.5 3.4* 3.8 3.8  CL 103  --  100* 101 98* 101 102 97*  CO2 24  --  24 24 26 25  18* 23  GLUCOSE 85  --  97 120* 96 84 88 147*  BUN 66*  --  72* 74* 78* 78* 80* 55*  CREATININE 6.05*  --  6.28* 6.30* 6.87* 6.81* 6.87* 5.65*  CALCIUM 8.9  --  8.6* 9.1 8.9 8.5* 8.7* 8.1*  PHOS  --  3.7 4.5 4.7* 5.0* 5.1* 4.7* 4.4   CBC  Recent Labs Lab 12/30/16 0041  01/01/17 0229 01/02/17 0314 01/04/17 0322 01/05/17 0453  WBC 6.5  < > 5.8 5.4 17.6* 11.0*  NEUTROABS 5.0  --   --   --   --   --   HGB 10.9*  < > 10.5* 10.1* 10.9*  10.4*  HCT 32.1*  < > 31.1* 30.6* 32.9* 31.4*  MCV 81.3  < > 81.4 81.0 81.4 80.9  PLT 200  < > 179 173 194 158  < > = values in this interval not displayed.  Medications:    . amLODipine  10 mg Oral Daily  . aspirin EC  81 mg Oral Daily  . carvedilol  25 mg Oral BID WC  . Chlorhexidine Gluconate Cloth  6 each Topical Daily  . docusate sodium  100 mg Oral BID  . Dulaglutide  0.75 mg Subcutaneous Weekly  . furosemide  40 mg Intravenous Daily  . hydrALAZINE  25 mg Oral TID  . insulin aspart  0-5 Units Subcutaneous QHS  . insulin aspart  0-9 Units Subcutaneous TID WC  . morphine  15 mg Oral Q12H  . mupirocin ointment  1 application Nasal BID  . potassium chloride SA  20 mEq Oral Daily  . sodium chloride flush  3 mL Intravenous Q12H  . sodium chloride flush  3 mL Intravenous Q12H  . tamsulosin  0.4 mg Oral BID    Adin Hector, MD PGY-2 01/05/17  8:53 AM

## 2017-01-05 NOTE — Progress Notes (Signed)
Pt transferred to 6E09 and report was given to [facility].

## 2017-01-05 NOTE — Progress Notes (Signed)
Progress Note  Patient Name: James Burke Date of Encounter: 01/05/2017  Primary Cardiologist: Stanford Breed  Subjective   Pt  Has had 2 hemodialysis treatments that he has tolerated well. Still having some pleuritic pain at site where pericardial drain was, especially with breathing, otherwise doing well. He stood up today by himself once.   Inpatient Medications    Scheduled Meds: . amLODipine  10 mg Oral Daily  . aspirin EC  81 mg Oral Daily  . atorvastatin  10 mg Oral q1800  . carvedilol  25 mg Oral BID WC  . Chlorhexidine Gluconate Cloth  6 each Topical Daily  . docusate sodium  100 mg Oral BID  . Dulaglutide  0.75 mg Subcutaneous Weekly  . furosemide  40 mg Intravenous Daily  . hydrALAZINE  25 mg Oral TID  . insulin aspart  0-5 Units Subcutaneous QHS  . insulin aspart  0-9 Units Subcutaneous TID WC  . morphine  15 mg Oral Q12H  . mupirocin ointment  1 application Nasal BID  . potassium chloride SA  20 mEq Oral Daily  . sodium chloride flush  3 mL Intravenous Q12H  . sodium chloride flush  3 mL Intravenous Q12H  . tamsulosin  0.4 mg Oral BID   Continuous Infusions: . sodium chloride 500 mL (01/03/17 1018)  . sodium chloride    . sodium chloride    . sodium chloride    . sodium chloride     PRN Meds: sodium chloride, sodium chloride, sodium chloride, sodium chloride, acetaminophen, heparin, ondansetron (ZOFRAN) IV, oxyCODONE-acetaminophen **AND** oxyCODONE, polyethylene glycol, sodium chloride flush, sodium chloride flush, zolpidem   Vital Signs    Vitals:   01/05/17 1200 01/05/17 1230 01/05/17 1300 01/05/17 1312  BP: 112/71 113/76 125/82 123/74  Pulse: 67 67 69 71  Resp:    19  Temp:    97.8 F (36.6 C)  TempSrc:    Oral  SpO2:    97%  Weight:    179 lb 7.3 oz (81.4 kg)  Height:        Intake/Output Summary (Last 24 hours) at 01/05/17 1558 Last data filed at 01/05/17 1312  Gross per 24 hour  Intake              480 ml  Output             5400 ml  Net             -4920 ml   Filed Weights   01/05/17 0350 01/05/17 1030 01/05/17 1312  Weight: 183 lb 1.6 oz (83.1 kg) 186 lb 11.7 oz (84.7 kg) 179 lb 7.3 oz (81.4 kg)    Telemetry    NSR in the 70's- Personally Reviewed  Physical Exam   GEN: No acute distress.   Neck: No JVD, R IJ HD cath looks healthy Cardiac: RRR, no murmurs, rubs, or gallops. Pericardial drain site without drainage Respiratory: Clear to auscultation bilaterally. GI: Soft, nontender, non-distended  MS: No edema; Charcot foot on right. New AV fistula left arm Neuro:  Nonfocal  Psych: Normal affect   Labs    Chemistry Recent Labs Lab 12/30/16 1354  01/03/17 0356 01/04/17 0322 01/05/17 0453  NA  --   < > 136 136 132*  K  --   < > 3.4* 3.8 3.8  CL  --   < > 101 102 97*  CO2  --   < > 25 18* 23  GLUCOSE  --   < >  84 88 147*  BUN  --   < > 78* 80* 55*  CREATININE  --   < > 6.81* 6.87* 5.65*  CALCIUM  --   < > 8.5* 8.7* 8.1*  PROT 6.3*  --   --   --   --   ALBUMIN 3.4*  < > 3.1* 3.2* 2.6*  AST 13*  --   --   --   --   ALT 14*  --   --   --   --   ALKPHOS 47  --   --   --   --   BILITOT 0.5  --   --   --   --   GFRNONAA  --   < > 9* 8* 11*  GFRAA  --   < > 10* 10* 12*  ANIONGAP  --   < > 10 16* 12  < > = values in this interval not displayed.   Hematology  Recent Labs Lab 01/02/17 0314 01/04/17 0322 01/05/17 0453  WBC 5.4 17.6* 11.0*  RBC 3.78* 4.04* 3.88*  HGB 10.1* 10.9* 10.4*  HCT 30.6* 32.9* 31.4*  MCV 81.0 81.4 80.9  MCH 26.7 27.0 26.8  MCHC 33.0 33.1 33.1  RDW 13.9 14.0 14.2  PLT 173 194 158    Cardiac Enzymes  Recent Labs Lab 12/30/16 0041 12/30/16 0545 12/30/16 1354  TROPONINI 0.03* 0.04* 0.03*   No results for input(s): TROPIPOC in the last 168 hours.   BNP  Recent Labs Lab 12/30/16 0042  BNP 2,695.8*     DDimer No results for input(s): DDIMER in the last 168 hours.   Radiology    No results found.  Cardiac Studies   Repeat echo interpreted as moderate,  mostly posterior and lateral effusion, possibly loculated. On my review, effusion is small. There is no tamponade. EF remains 25-30%.  Patient Profile     51 y.o. 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 Presented with acute CHF exacerbation, large pericardial effusion and worsened renal function. No clear tamponade, but decision was made to drain the effusion due to impending need to initiate HD and concern about circulatory instability with volume removal.  Assessment & Plan    1. Acute on chronic systolic and diastolic heart failure Marked improvement. Has had 2 HD treatments, yesterday and today, which will change need for diuretics. On high dose carvedilol and hydralazine. Will ask Nephrology opinion re: use of ARB/entresto since it seems he has progressed to ESRD. Is on carvedilol 25 mg bid, hydralazine 25 mg tid, lasix and dialysis. Lasix dosing per nephrology. Will transfer out of 2H to telemetry (OK with Dr. Sallyanne Kuster)   2. Pericardial effusion Pt had periciocentesis on 01/03/17 with removal of 1L fluid. Drain left overnight and removed yesterday.  If fluid re-accumulates, will need a pericardial window. No significance to fluid analysis per lab.  3. CKD stage IV Nephrology following. Now on hemodialysis via right IJ catheter. Also had AV fistula placed in left arm. Has had 2 HD treatments, yesterday and today. 2.5 L removed yesterday. Goal ultrafiltration of 2L today, just finished. Wt was down 3 pounds prior to this treatment. Pt seems to be tolerating treatments well.  It does not appear that draining the effusion had any hemodynamic impact. He was not in tamponade. Therefore do not anticipate a major improvement in renal function after the pericardiocentesis. Still unclear whether his renal function is  bad enough to cause uremic pericarditis - he really does not have other uremic symptoms.  4.  DM -Hgb A1c 5.2 on 10/26/16 -Management per IM  5. HLD -LDL 63, 12/30/16. At goal on atorvastatin 10 mg daily  6. Hypertension -On amlodipine, carvedilol, lasix, hydralazine. Add ARB (if we think he will stay ESRD) instead of amlodipine?   Signed, Daune Perch, NP  01/05/2017, 3:58 PM    I have seen and examined the patient along with Daune Perch, NP .  I have reviewed the chart, notes and new data.  I agree with NP's note.  Key new complaints: still has pleuritic pain Key examination changes: soft pericardial rub   PLAN: He cannot take NSAIDs and he had severe visual abnormalities when given steroids in the past (for poison sumac exposure) and was told to never take steroids again. Doubt colchicine would help. No good antiinflamatory options for pericarditis. Recheck limited echo tomorrow.  [name], MD, [place] [phone] 01/05/2017, 6:53 PM

## 2017-01-06 ENCOUNTER — Inpatient Hospital Stay (HOSPITAL_COMMUNITY): Payer: BLUE CROSS/BLUE SHIELD

## 2017-01-06 DIAGNOSIS — Z992 Dependence on renal dialysis: Secondary | ICD-10-CM

## 2017-01-06 DIAGNOSIS — J918 Pleural effusion in other conditions classified elsewhere: Secondary | ICD-10-CM

## 2017-01-06 LAB — RENAL FUNCTION PANEL
Albumin: 2.3 g/dL — ABNORMAL LOW (ref 3.5–5.0)
Anion gap: 14 (ref 5–15)
BUN: 59 mg/dL — ABNORMAL HIGH (ref 6–20)
CO2: 24 mmol/L (ref 22–32)
Calcium: 8 mg/dL — ABNORMAL LOW (ref 8.9–10.3)
Chloride: 96 mmol/L — ABNORMAL LOW (ref 101–111)
Creatinine, Ser: 5.72 mg/dL — ABNORMAL HIGH (ref 0.61–1.24)
GFR calc Af Amer: 12 mL/min — ABNORMAL LOW (ref >60)
GFR calc non Af Amer: 11 mL/min — ABNORMAL LOW (ref >60)
Glucose, Bld: 146 mg/dL — ABNORMAL HIGH (ref 65–99)
Phosphorus: 5 mg/dL — ABNORMAL HIGH (ref 2.5–4.6)
Potassium: 4.3 mmol/L (ref 3.5–5.1)
Sodium: 134 mmol/L — ABNORMAL LOW (ref 135–145)

## 2017-01-06 LAB — GLUCOSE, CAPILLARY
Glucose-Capillary: 179 mg/dL — ABNORMAL HIGH (ref 65–99)
Glucose-Capillary: 232 mg/dL — ABNORMAL HIGH (ref 65–99)
Glucose-Capillary: 190 mg/dL — ABNORMAL HIGH (ref 65–99)
Glucose-Capillary: 199 mg/dL — ABNORMAL HIGH (ref 65–99)

## 2017-01-06 LAB — CBC
HCT: 29.6 % — ABNORMAL LOW (ref 39.0–52.0)
Hemoglobin: 9.6 g/dL — ABNORMAL LOW (ref 13.0–17.0)
MCH: 26.6 pg (ref 26.0–34.0)
MCHC: 32.4 g/dL (ref 30.0–36.0)
MCV: 82 fL (ref 78.0–100.0)
Platelets: 150 10*3/uL (ref 150–400)
RBC: 3.61 MIL/uL — ABNORMAL LOW (ref 4.22–5.81)
RDW: 14.3 % (ref 11.5–15.5)
WBC: 9.3 10*3/uL (ref 4.0–10.5)

## 2017-01-06 LAB — ECHOCARDIOGRAM LIMITED
Height: 72 in
Weight: 2871.27 oz

## 2017-01-06 LAB — PTH, INTACT AND CALCIUM
Calcium, Total (PTH): 8.2 mg/dL — ABNORMAL LOW (ref 8.7–10.2)
PTH: 110 pg/mL — ABNORMAL HIGH (ref 15–65)

## 2017-01-06 LAB — MAGNESIUM: Magnesium: 2.1 mg/dL (ref 1.7–2.4)

## 2017-01-06 MED ORDER — DARBEPOETIN ALFA 60 MCG/0.3ML IJ SOSY
60.0000 ug | PREFILLED_SYRINGE | INTRAMUSCULAR | Status: DC
  Administered 2017-01-07: 60 ug via INTRAVENOUS
  Filled 2017-01-06: qty 0.3

## 2017-01-06 MED ORDER — OXYCODONE HCL ER 10 MG PO T12A: 10.0000 mg | EXTENDED_RELEASE_TABLET | Freq: Two times a day (BID) | ORAL | Status: DC

## 2017-01-06 NOTE — Progress Notes (Signed)
Progress Note  Patient Name: James Burke Date of Encounter: 01/06/2017  Primary Cardiologist: Stanford Breed  Subjective   No acute events overnight. Pt underwent second dialysis yesterday and had 2 L ultrafiltrate. They plan to do another dialysis tomorrow with goal output 3 L and possible discharge tomorrow post HD.  He denies any dyspnea, or chest pain. His chest tube was removed yesterday and he is still having some pain at that site. He denies pleuritic chest pain.  Pt is already clipped on TRSa   He received 40 mg IV lasix this AM.     Inpatient Medications    Scheduled Meds: . amLODipine  10 mg Oral Daily  . aspirin EC  81 mg Oral Daily  . atorvastatin  10 mg Oral q1800  . carvedilol  25 mg Oral BID WC  . Chlorhexidine Gluconate Cloth  6 each Topical Daily  . [START ON 01/07/2017] darbepoetin (ARANESP) injection - DIALYSIS  60 mcg Intravenous Q Sat-HD  . docusate sodium  100 mg Oral BID  . Dulaglutide  0.75 mg Subcutaneous Weekly  . hydrALAZINE  25 mg Oral TID  . insulin aspart  0-5 Units Subcutaneous QHS  . insulin aspart  0-9 Units Subcutaneous TID WC  . mupirocin ointment  1 application Nasal BID  . potassium chloride SA  20 mEq Oral Daily  . sodium chloride flush  3 mL Intravenous Q12H  . sodium chloride flush  3 mL Intravenous Q12H  . tamsulosin  0.4 mg Oral BID   Continuous Infusions: . sodium chloride 500 mL (01/03/17 1018)  . sodium chloride    . sodium chloride    . sodium chloride    . sodium chloride     PRN Meds: sodium chloride, sodium chloride, sodium chloride, sodium chloride, acetaminophen, heparin, ondansetron (ZOFRAN) IV, oxyCODONE-acetaminophen **AND** oxyCODONE, polyethylene glycol, sodium chloride flush, sodium chloride flush, zolpidem   Vital Signs    Vitals:   01/05/17 2000 01/05/17 2040 01/06/17 0617 01/06/17 1026  BP: 118/76 123/74 127/73 111/64  Pulse:  77 75 64  Resp: 17 20 18 18   Temp:  98.5 F (36.9 C) 98.4 F (36.9 C) 98.3 F  (36.8 C)  TempSrc:   Oral Oral  SpO2:  97% 97% 96%  Weight:      Height:        Intake/Output Summary (Last 24 hours) at 01/06/17 1429 Last data filed at 01/06/17 1103  Gross per 24 hour  Intake              300 ml  Output              575 ml  Net             -275 ml   Filed Weights   01/05/17 0350 01/05/17 1030 01/05/17 1312  Weight: 183 lb 1.6 oz (83.1 kg) 186 lb 11.7 oz (84.7 kg) 179 lb 7.3 oz (81.4 kg)    Telemetry    NSR in the 70's  Physical Exam   GEN: No acute distress.   Neck: No JVD, R IJ HD cath looks healthy Cardiac: RRR, no murmurs, rubs, or gallops. Pericardial drain site without drainage Respiratory: Clear to auscultation bilaterally. GI: Soft, nontender, non-distended  MS: No edema; Charcot foot on right. New AV fistula left arm Neuro:  Nonfocal  Psych: Normal affect   Labs    Chemistry  Recent Labs Lab 01/04/17 0322 01/05/17 0453 01/05/17 1101 01/06/17 0500  NA 136 132*  --  134*  K 3.8 3.8  --  4.3  CL 102 97*  --  96*  CO2 18* 23  --  24  GLUCOSE 88 147*  --  146*  BUN 80* 55*  --  59*  CREATININE 6.87* 5.65*  --  5.72*  CALCIUM 8.7* 8.1* 8.2* 8.0*  ALBUMIN 3.2* 2.6*  --  2.3*  GFRNONAA 8* 11*  --  11*  GFRAA 10* 12*  --  12*  ANIONGAP 16* 12  --  14     Hematology  Recent Labs Lab 01/04/17 0322 01/05/17 0453 01/06/17 0500  WBC 17.6* 11.0* 9.3  RBC 4.04* 3.88* 3.61*  HGB 10.9* 10.4* 9.6*  HCT 32.9* 31.4* 29.6*  MCV 81.4 80.9 82.0  MCH 27.0 26.8 26.6  MCHC 33.1 33.1 32.4  RDW 14.0 14.2 14.3  PLT 194 158 150    Cardiac Enzymes No results for input(s): TROPONINI in the last 168 hours. No results for input(s): TROPIPOC in the last 168 hours.   BNP No results for input(s): BNP, PROBNP in the last 168 hours.   DDimer No results for input(s): DDIMER in the last 168 hours.   Radiology    No results found.  Cardiac Studies   Echo 5/2 Repeat echo interpreted as moderate, mostly posterior and lateral effusion,  possibly loculated. On further review, effusion is small. There is no tamponade. EF remains 25-30%.  Echo 5/4  - Left ventricle: Systolic function was severely reduced. The   estimated ejection fraction was in the range of 25% to 30%.   Diffuse hypokinesis. - Left atrium: The atrium was mildly dilated. - Tricuspid valve: There was moderate regurgitation. - Pulmonary arteries: PA peak pressure: 48 mm Hg (S). - Pericardium, extracardiac: Moderate-sized pericardial effusion.   Cannot exclude tamponde physiology due to the degree of   cardiomyopathy.  - Compared to the prior study on 01/04/2017, there has been no   significant interval change in the pericardial effusion.   Patient Profile     51 y.o. 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 Presented with acute CHF exacerbation, large pericardial effusion and worsened renal function. No clear tamponade, but decision was made to drain the effusion due to impending need to initiate HD and concern about circulatory instability with volume removal.  Assessment & Plan    1. Acute on chronic systolic and diastolic heart failure Has had 2 HD treatments. On high dose carvedilol and hydralazine. Is on carvedilol 25 mg bid, hydralazine 25 mg tid, and lasix.   2. Pericardial effusion Pt had pericardiocentesis on 01/03/17 with removal of 1L fluid. Drain has been removed. No significance to fluid analysis per lab. The hope is that pt is on temporary dialysis and that his renal function will recover, and so would like to avoid NSAIDs and other nephrotoxic agents Repeat echo shows that the pericardial effusion is stable and there is no interval change.   -avoid NSAID -has steroid allergy , can try colchicine   3. CKD stage IV Nephrology following. Now on hemodialysis via right IJ catheter. Also had AV fistula placed in left arm. Has had 2 HD treatments, . Weight of  179 lbs today. Plan to do another HD tomorrow.   4. Hypertension -On amlodipine, carvedilol, lasix, hydralazine.    Signed, Burgess Estelle, MD  01/06/2017, 2:29 PM    I have seen and examined the patient along with Burgess Estelle, MD .  I have reviewed the chart, notes and new data.  I agree with his note.  Key new complaints: pleuritic discomfort is greatly improved. He is bothered more by the site of the dialysis catheter Key examination changes: no rub is heard, clear lungs Key new findings / data: Reviewed the echo and compared it to previous studies. In my opinion the residual pericardial effusion is small area that is clearly not change from 2 days earlier. Fluid cytology is benign, reactive mesothelial cells.  PLAN: James Burke had an inflammatory pericarditis with large volume of effusion, but without overt tamponade.  The dyspnea that led to his presentation was resolved with diuresis and was secondary to hypovolemia/decompensated combined acute on chronic systolic and diastolic heart failure. He has a long-standing history of cardiomyopathy, dating back at least 2007, with estimated left ventricular ejection fraction varying from 15% to 45%. Etiology of the pericardial effusion is unclear;  he simply does not appear sick enough for uremic pericarditis. It was not a simple effusion related to hydrostatic causes. It is possible that he had viral pericarditis. He is tolerating initiation of hemodialysis well. Since we still harbor some hope that he may have some recover renal function, NSAIDs have not been prescribed. Also wary of the potential side effects of colchicine in the setting of renal failure. He had serious side effects when steroids were administered in the past and does not want to try them again. Recommend another follow-up limited echocardiogram after discharge, in approximately 3 weeks. He is instructed to call sooner if he develops symptoms of syncope/presyncope, dyspnea,  orthopnea.  [name], MD, [place] [phone] 01/06/2017, 6:41 PM

## 2017-01-06 NOTE — Progress Notes (Signed)
  Echocardiogram 2D Echocardiogram has been performed.  James Burke 01/06/2017, 1:25 PM

## 2017-01-06 NOTE — Progress Notes (Signed)
Accepted at [facility] .1st treatment 01/10/17 at 6:00 am .Schedule and Time Tues,Thurs,Sat at 6:00 am.

## 2017-01-06 NOTE — Progress Notes (Signed)
PROGRESS NOTE  James Burke  HAL:[id number] DOB: 05/04/1966 DOA: 12/29/2016 PCP: [name], MD  Brief Narrative:   The patient is a 51 year old male with history of diabetes mellitus type 2, Charcot foot on the right side, congestive heart failure with an ejection fraction of 35-40 percent, stage III chronic kidney disease, who was admitted on 12/29/2016 secondary to shortness of breath.  He initially presented to [facility] but was found to have acute on chronic kidney injury and acute on chronic combined CHF requiring nephrology consultation so he was transferred to [facility] for ongoing therapy.  Echocardiogram demonstrated a moderate to large pericardial effusion.  Cardiology was consulted.  He had serial echocardiograms which demonstrated worsening tamponade-like physiology and so he underwent pericardiocentesis on 5/1 with removal of a liter of fluid. A drain was left in place however it was very painful and so it was removed on 5/2. It is unclear what the etiology of the pericardial effusion is. He did have acute on chronic kidney injury but his BUN was not extremely elevated. Nephrology felt it was best to treat for possible uremic pericardial effusion and so a hemodialysis catheter and left arm fistula were placed on 5/1 and he started hemodialysis. He has been accepted to a hemodialysis center on a Tuesday, Thursday, Saturday schedule. He will undergo additional hemodialysis on 5/5. Cardiology is planning to repeat an echocardiogram again on 5/4 to determine if the pericardial effusion is recurrent. Will defer to cardiology when they feel that the patient is safe for discharge with outpatient follow-up. The soonest possible discharge date would be 5/5 after hemodialysis from a nephrology standpoint.  Assessment & Plan:   Principal Problem:   Acute on chronic combined systolic and diastolic congestive heart failure (HCC) Active Problems:   Essential  hypertension, benign   Pericardial effusion   Charcot foot due to diabetes mellitus (HCC)   Acute renal failure superimposed on chronic kidney disease (HCC)   BPH (benign prostatic hyperplasia)   AKI (acute kidney injury) ([place])   Hypertensive heart and chronic kidney disease with heart failure and stage 1 through stage 4 chronic kidney disease, or chronic kidney disease (HCC)   ESRD (end stage renal disease) (HCC)   Pericardial effusion without cardiac tamponade  Acute combined systolic and diastolic congestive heart failure, EF 25-30% -  Volume management with dialysis -  Received Lasix 40 mg IV this morning but will discontinue future doses -  Continue beta blocker -  Continue hydralazine  Acute on chronic large pericardial effusion status post pericardiocentesis on 5/1 and removal of pericardial drain on 5/2.  Chest pain and elevated troponin likely related to large pericardial effusion - Follow-up limited echocardiogram on 5/4  End-stage renal disease, likely due to hypertension and diabetes.  ANCA was only minimally elevated  Patient followed in the outpatient setting by Dr. Posey Pronto.  Following up at [facility] also for possible transplant. Follow up with vascular outpatient in 4-6 weeks and check duplex prior to it for maturation of his fistula Hemodialysis again on 5/5 Clipped for dialysis Center on Tuesday  Essential hypertension - Continue amlodipine 10mg  po daily and Coreg  Diabetes mellitus type 2 with Charcot foot: Mild hyperglycemia. Patient refusing insulin Last hemoglobin A1c noted to be 5.2 on 10/2016. - Hypoglycemic protocols - hold glipizide - CBGswith sensitive SSI  Hyperlipidemia:  Patient reports no longer being on atorvastatin.  BPH - Continue Flomax   Leukocytosis - resolved   DVT prophylaxis:  SCDs Code  Status:  Full code Family Communication:  Patient alone Disposition Plan:  If no reaccumulation of pericardial effusion on echocardiogram on 4/4,  patient may be ready for discharge after hemodialysis on 5/5. Patient mobile, anticipate he will not need home health services and can discharge to home on Saturday.   Consultants:   Cardiology  Nephrology  Vascular surgery  Procedures:  Echocardiogram. Pericardiocentesis, AV fistula, TDC placement.  Antimicrobials:  Anti-infectives    Start     Dose/Rate Route Frequency Ordered Stop   01/03/17 1130  ceFAZolin (ANCEF) IVPB 2g/100 mL premix  Status:  Discontinued    Comments:  Send with pt to OR   2 g 200 mL/hr over 30 Minutes Intravenous To [facility] 01/02/17 1541 01/03/17 1433   01/03/17 1130  ceFAZolin (ANCEF) IVPB 1 g/50 mL premix  Status:  Discontinued    Comments:  Send with pt to OR   1 g 100 mL/hr over 30 Minutes Intravenous To [facility] 01/02/17 1441 01/02/17 1543       Subjective:  No recent bowel movements. Still having chest pain but much better since removal of his pericardial drain. Also still having some mild shortness of breath. Had not been out of bed for several days but was able to ambulate to the bathroom without assistance last night. Eating and drinking well.  Objective: Vitals:   01/05/17 2000 01/05/17 2040 01/06/17 0617 01/06/17 1026  BP: 118/76 123/74 127/73 111/64  Pulse:  77 75 64  Resp: 17 20 18 18   Temp:  98.5 F (36.9 C) 98.4 F (36.9 C) 98.3 F (36.8 C)  TempSrc:   Oral Oral  SpO2:  97% 97% 96%  Weight:      Height:        Intake/Output Summary (Last 24 hours) at 01/06/17 1306 Last data filed at 01/06/17 1103  Gross per 24 hour  Intake              300 ml  Output             2575 ml  Net            -2275 ml   Filed Weights   01/05/17 0350 01/05/17 1030 01/05/17 1312  Weight: 83.1 kg (183 lb 1.6 oz) 84.7 kg (186 lb 11.7 oz) 81.4 kg (179 lb 7.3 oz)    Examination:  General exam:  Adult Male.  No acute distress.  HEENT:  NCAT, MMM Respiratory system: Rales at the bilateral bases, no wheezes or  rhonchi Cardiovascular system: Regular rate and rhythm, normal S1/S2. No murmurs, rubs, gallops or clicks.  Warm extremities.  Right chest tunneled hemodialysis catheter appears clean and dry, no surrounding erythema. Left arm fistula incision site also minimally erythematous, appears to be healing well. Gastrointestinal system: Normal active bowel sounds, soft, nondistended, nontender. MSK:  Normal tone and bulk, 1+ pitting bilateral lower extremity edema Neuro:  Grossly intact    Data Reviewed: I have personally reviewed following labs and imaging studies  CBC:  Recent Labs Lab 01/01/17 0229 01/02/17 0314 01/04/17 0322 01/05/17 0453 01/06/17 0500  WBC 5.8 5.4 17.6* 11.0* 9.3  HGB 10.5* 10.1* 10.9* 10.4* 9.6*  HCT 31.1* 30.6* 32.9* 31.4* 29.6*  MCV 81.4 81.0 81.4 80.9 82.0  PLT 179 173 194 158 078   Basic Metabolic Panel:  Recent Labs Lab 12/31/16 0241  01/02/17 0314 01/03/17 0356 01/04/17 0322 01/05/17 0453 01/05/17 1101 01/06/17 0500  NA 136  < > 138 136 136 132*  --  134*  K 3.5  < > 3.5 3.4* 3.8 3.8  --  4.3  CL 100*  < > 98* 101 102 97*  --  96*  CO2 24  < > 26 25 18* 23  --  24  GLUCOSE 97  < > 96 84 88 147*  --  146*  BUN 72*  < > 78* 78* 80* 55*  --  59*  CREATININE 6.28*  < > 6.87* 6.81* 6.87* 5.65*  --  5.72*  CALCIUM 8.6*  < > 8.9 8.5* 8.7* 8.1* 8.2* 8.0*  MG 2.1  --   --   --   --  2.1  --  2.1  PHOS 4.5  < > 5.0* 5.1* 4.7* 4.4  --  5.0*  < > = values in this interval not displayed. GFR: Estimated Creatinine Clearance: 17.1 mL/min (A) (by C-G formula based on SCr of 5.72 mg/dL (H)). Liver Function Tests:  Recent Labs Lab 12/30/16 1354  01/02/17 0314 01/03/17 0356 01/04/17 0322 01/05/17 0453 01/06/17 0500  AST 13*  --   --   --   --   --   --   ALT 14*  --   --   --   --   --   --   ALKPHOS 47  --   --   --   --   --   --   BILITOT 0.5  --   --   --   --   --   --   PROT 6.3*  --   --   --   --   --   --   ALBUMIN 3.4*  < > 3.2* 3.1* 3.2*  2.6* 2.3*  < > = values in this interval not displayed. No results for input(s): LIPASE, AMYLASE in the last 168 hours. No results for input(s): AMMONIA in the last 168 hours. Coagulation Profile:  Recent Labs Lab 01/03/17 0356 01/05/17 0453  INR 1.15 1.48   Cardiac Enzymes:  Recent Labs Lab 12/30/16 1354  TROPONINI 0.03*   BNP (last 3 results) No results for input(s): PROBNP in the last 8760 hours. HbA1C: No results for input(s): HGBA1C in the last 72 hours. CBG:  Recent Labs Lab 01/05/17 1405 01/05/17 1830 01/05/17 2037 01/06/17 0802 01/06/17 1131  GLUCAP 162* 211* 226* 179* 232*   Lipid Profile: No results for input(s): CHOL, HDL, LDLCALC, TRIG, CHOLHDL, LDLDIRECT in the last 72 hours. Thyroid Function Tests: No results for input(s): TSH, T4TOTAL, FREET4, T3FREE, THYROIDAB in the last 72 hours. Anemia Panel:  Recent Labs  01/05/17 1101  FERRITIN 111   Urine analysis:    Component Value Date/Time   COLORURINE YELLOW 12/30/2016 1115   APPEARANCEUR CLEAR 12/30/2016 1115   LABSPEC 1.010 12/30/2016 1115   PHURINE 5.0 12/30/2016 1115   GLUCOSEU 50 (A) 12/30/2016 1115   HGBUR SMALL (A) 12/30/2016 1115   BILIRUBINUR NEGATIVE 12/30/2016 1115   BILIRUBINUR negative 10/24/2014 1659   KETONESUR NEGATIVE 12/30/2016 1115   PROTEINUR 100 (A) 12/30/2016 1115   UROBILINOGEN 0.2 10/24/2014 1659   NITRITE NEGATIVE 12/30/2016 1115   LEUKOCYTESUR NEGATIVE 12/30/2016 1115   Sepsis Labs: @LABRCNTIP (procalcitonin:4,lacticidven:4)  ) Recent Results (from the past 240 hour(s))  Surgical pcr screen     Status: Abnormal   Collection Time: 01/02/17  5:24 PM  Result Value Ref Range Status   MRSA, PCR NEGATIVE NEGATIVE Final   Staphylococcus aureus POSITIVE (A) NEGATIVE Final    Comment:  The Xpert SA Assay (FDA approved for NASAL specimens in patients over 26 years of age), is one component of a comprehensive surveillance program.  Test performance has been  validated by [facility] for patients greater than or equal to 32 year old. It is not intended to diagnose infection nor to guide or monitor treatment.   Gram stain     Status: None   Collection Time: 01/03/17  4:16 PM  Result Value Ref Range Status   Specimen Description FLUID PERICARDIAL  Final   Special Requests NONE  Final   Gram Stain   Final    WBC PRESENT, PREDOMINANTLY MONONUCLEAR NO ORGANISMS SEEN CYTOSPIN SMEAR    Report Status 01/03/2017 FINAL  Final  Culture, body fluid-bottle     Status: None (Preliminary result)   Collection Time: 01/03/17  4:16 PM  Result Value Ref Range Status   Specimen Description FLUID PERICARDIAL  Final   Special Requests BOTTLES DRAWN AEROBIC AND ANAEROBIC  Final   Culture NO GROWTH 2 DAYS  Final   Report Status PENDING  Incomplete      Radiology Studies: No results found.   Scheduled Meds: . amLODipine  10 mg Oral Daily  . aspirin EC  81 mg Oral Daily  . atorvastatin  10 mg Oral q1800  . carvedilol  25 mg Oral BID WC  . Chlorhexidine Gluconate Cloth  6 each Topical Daily  . [START ON 01/07/2017] darbepoetin (ARANESP) injection - DIALYSIS  60 mcg Intravenous Q Sat-HD  . docusate sodium  100 mg Oral BID  . Dulaglutide  0.75 mg Subcutaneous Weekly  . furosemide  40 mg Intravenous Daily  . hydrALAZINE  25 mg Oral TID  . insulin aspart  0-5 Units Subcutaneous QHS  . insulin aspart  0-9 Units Subcutaneous TID WC  . mupirocin ointment  1 application Nasal BID  . potassium chloride SA  20 mEq Oral Daily  . sodium chloride flush  3 mL Intravenous Q12H  . sodium chloride flush  3 mL Intravenous Q12H  . tamsulosin  0.4 mg Oral BID   Continuous Infusions: . sodium chloride 500 mL (01/03/17 1018)  . sodium chloride    . sodium chloride    . sodium chloride    . sodium chloride       LOS: 8 days    Time spent: 30 min    Janece Canterbury, MD [facility] Pager [phone]  If 7PM-7AM, please contact  night-coverage [email or web address] Password [email or web address] 01/06/2017, 1:06 PM

## 2017-01-06 NOTE — Progress Notes (Signed)
Admit: 12/29/2016 LOS: 8  11M new ESRD with acute systolic CHF exacerbation, large pericardial effusion s/p drainage  Subjective:  Second dialysis treatment yesterday, uneventful. Weight is trending down. 2 L ultrafiltration. Blood pressure is stable. Pain associated with pericardiocentesis is improving. Less dyspnea. Patient has clip completed to [facility] on Tuesday Thursday Saturday schedule.  05/03 0701 - 05/04 0700 In: 60 [P.O.:60] Out: 2575 [Urine:575]  Filed Weights   01/05/17 0350 01/05/17 1030 01/05/17 1312  Weight: 83.1 kg (183 lb 1.6 oz) 84.7 kg (186 lb 11.7 oz) 81.4 kg (179 lb 7.3 oz)    Scheduled Meds: . amLODipine  10 mg Oral Daily  . aspirin EC  81 mg Oral Daily  . atorvastatin  10 mg Oral q1800  . carvedilol  25 mg Oral BID WC  . Chlorhexidine Gluconate Cloth  6 each Topical Daily  . docusate sodium  100 mg Oral BID  . Dulaglutide  0.75 mg Subcutaneous Weekly  . furosemide  40 mg Intravenous Daily  . hydrALAZINE  25 mg Oral TID  . insulin aspart  0-5 Units Subcutaneous QHS  . insulin aspart  0-9 Units Subcutaneous TID WC  . morphine  15 mg Oral Q12H  . mupirocin ointment  1 application Nasal BID  . potassium chloride SA  20 mEq Oral Daily  . sodium chloride flush  3 mL Intravenous Q12H  . sodium chloride flush  3 mL Intravenous Q12H  . tamsulosin  0.4 mg Oral BID   Continuous Infusions: . sodium chloride 500 mL (01/03/17 1018)  . sodium chloride    . sodium chloride    . sodium chloride    . sodium chloride     PRN Meds:.sodium chloride, sodium chloride, sodium chloride, sodium chloride, acetaminophen, heparin, ondansetron (ZOFRAN) IV, oxyCODONE-acetaminophen **AND** oxyCODONE, polyethylene glycol, sodium chloride flush, sodium chloride flush, zolpidem  Current Labs: reviewed    Physical Exam:  Blood pressure 111/64, pulse 64, temperature 98.3 F (36.8 C), temperature source Oral, resp. rate 18, height 6' (1.829 m), weight 81.4 kg (179 lb 7.3  oz), SpO2 96 %. No acute distress, awake, alert, interactive Regular rate and rhythm without murmur, normal S1 and S2 Lungs clear bilaterally with normal work of breathing No lower extremity edema Abdomen soft, nontender nondistended Left upper extremity AV fistula with bruit and thrill, surgical site clean/dry/intact R IJ Tunnel dialysis catheter bandaged  A 1. New ESRD  1. CLIP to [facility]  2. Started HD 01/04/17 using [place] 3. Has recently placed 01/03/17 LUE BVT 2. Systolic CHF exacerbatin 3. Pericardial Effusion s/p pericardiocentesis, drain removed 4. Anemia of CKD 5. CKD BMD: PTH, P and Ca at goal not on VDRA, Binders, Cinacalcet   P 1. HD tomorrow, Tx #3: 3K, 2.5K, UF target 3L 2. F/u limited TTE today 3. Possible for DC tomorrow post HD 4. Check iron panel with HD, aranesp start tomorrow as well   Pearson Grippe MD 01/06/2017, 11:43 AM   Recent Labs Lab 01/04/17 0322 01/05/17 0453 01/05/17 1101 01/06/17 0500  NA 136 132*  --  134*  K 3.8 3.8  --  4.3  CL 102 97*  --  96*  CO2 18* 23  --  24  GLUCOSE 88 147*  --  146*  BUN 80* 55*  --  59*  CREATININE 6.87* 5.65*  --  5.72*  CALCIUM 8.7* 8.1* 8.2* 8.0*  PHOS 4.7* 4.4  --  5.0*    Recent Labs Lab 01/04/17 0322 01/05/17  0453 01/06/17 0500  WBC 17.6* 11.0* 9.3  HGB 10.9* 10.4* 9.6*  HCT 32.9* 31.4* 29.6*  MCV 81.4 80.9 82.0  PLT 194 158 150

## 2017-01-06 NOTE — Progress Notes (Signed)
Patient refusing insulin stating he does not take insulin he takes a pill at home.  MD notified.

## 2017-01-07 ENCOUNTER — Other Ambulatory Visit: Payer: Self-pay | Admitting: Cardiology

## 2017-01-07 DIAGNOSIS — E11610 Type 2 diabetes mellitus with diabetic neuropathic arthropathy: Secondary | ICD-10-CM

## 2017-01-07 DIAGNOSIS — N400 Enlarged prostate without lower urinary tract symptoms: Secondary | ICD-10-CM

## 2017-01-07 DIAGNOSIS — I313 Pericardial effusion (noninflammatory): Secondary | ICD-10-CM

## 2017-01-07 DIAGNOSIS — I3139 Other pericardial effusion (noninflammatory): Secondary | ICD-10-CM

## 2017-01-07 LAB — GLUCOSE, CAPILLARY
Glucose-Capillary: 117 mg/dL — ABNORMAL HIGH (ref 65–99)
Glucose-Capillary: 135 mg/dL — ABNORMAL HIGH (ref 65–99)

## 2017-01-07 LAB — CBC
HCT: 28.1 % — ABNORMAL LOW (ref 39.0–52.0)
Hemoglobin: 9.2 g/dL — ABNORMAL LOW (ref 13.0–17.0)
MCH: 26.6 pg (ref 26.0–34.0)
MCHC: 32.7 g/dL (ref 30.0–36.0)
MCV: 81.2 fL (ref 78.0–100.0)
Platelets: 150 10*3/uL (ref 150–400)
RBC: 3.46 MIL/uL — ABNORMAL LOW (ref 4.22–5.81)
RDW: 14 % (ref 11.5–15.5)
WBC: 7.7 10*3/uL (ref 4.0–10.5)

## 2017-01-07 LAB — RENAL FUNCTION PANEL
Albumin: 2.5 g/dL — ABNORMAL LOW (ref 3.5–5.0)
Anion gap: 15 (ref 5–15)
BUN: 78 mg/dL — ABNORMAL HIGH (ref 6–20)
CO2: 23 mmol/L (ref 22–32)
Calcium: 8.3 mg/dL — ABNORMAL LOW (ref 8.9–10.3)
Chloride: 93 mmol/L — ABNORMAL LOW (ref 101–111)
Creatinine, Ser: 6.55 mg/dL — ABNORMAL HIGH (ref 0.61–1.24)
GFR calc Af Amer: 10 mL/min — ABNORMAL LOW (ref >60)
GFR calc non Af Amer: 9 mL/min — ABNORMAL LOW (ref >60)
Glucose, Bld: 132 mg/dL — ABNORMAL HIGH (ref 65–99)
Phosphorus: 5.7 mg/dL — ABNORMAL HIGH (ref 2.5–4.6)
Potassium: 4.1 mmol/L (ref 3.5–5.1)
Sodium: 131 mmol/L — ABNORMAL LOW (ref 135–145)

## 2017-01-07 LAB — BASIC METABOLIC PANEL
Anion gap: 15 (ref 5–15)
BUN: 77 mg/dL — ABNORMAL HIGH (ref 6–20)
CO2: 23 mmol/L (ref 22–32)
Calcium: 8.3 mg/dL — ABNORMAL LOW (ref 8.9–10.3)
Chloride: 93 mmol/L — ABNORMAL LOW (ref 101–111)
Creatinine, Ser: 6.59 mg/dL — ABNORMAL HIGH (ref 0.61–1.24)
GFR calc Af Amer: 10 mL/min — ABNORMAL LOW (ref >60)
GFR calc non Af Amer: 9 mL/min — ABNORMAL LOW (ref >60)
Glucose, Bld: 133 mg/dL — ABNORMAL HIGH (ref 65–99)
Potassium: 4.1 mmol/L (ref 3.5–5.1)
Sodium: 131 mmol/L — ABNORMAL LOW (ref 135–145)

## 2017-01-07 LAB — MAGNESIUM: Magnesium: 2.3 mg/dL (ref 1.7–2.4)

## 2017-01-07 MED ORDER — HYDRALAZINE HCL 25 MG PO TABS: 25.0000 mg | ORAL_TABLET | Freq: Three times a day (TID) | ORAL | 0 refills | Status: DC

## 2017-01-07 MED ORDER — DARBEPOETIN ALFA 60 MCG/0.3ML IJ SOSY
PREFILLED_SYRINGE | INTRAMUSCULAR | Status: AC
  Filled 2017-01-07: qty 0.3

## 2017-01-07 MED ORDER — ALTEPLASE 2 MG IJ SOLR
4.0000 mg | Freq: Once | INTRAMUSCULAR | Status: AC
  Administered 2017-01-07: 4 mg

## 2017-01-07 MED ORDER — OXYCODONE-ACETAMINOPHEN 5-325 MG PO TABS
ORAL_TABLET | ORAL | Status: AC
  Filled 2017-01-07: qty 1

## 2017-01-07 MED ORDER — OXYCODONE HCL 5 MG PO TABS
ORAL_TABLET | ORAL | Status: AC
  Administered 2017-01-07: 5 mg via ORAL
  Filled 2017-01-07: qty 1

## 2017-01-07 MED ORDER — AMLODIPINE BESYLATE 10 MG PO TABS: 10.0000 mg | ORAL_TABLET | Freq: Every day | ORAL | 0 refills | Status: DC

## 2017-01-07 MED ORDER — POLYETHYLENE GLYCOL 3350 17 GM/SCOOP PO POWD: 17.0000 g | Freq: Every day | ORAL | 0 refills | Status: DC

## 2017-01-07 MED ORDER — OXYCODONE HCL 5 MG PO TABS
ORAL_TABLET | ORAL | Status: AC
  Filled 2017-01-07: qty 1

## 2017-01-07 MED ORDER — DOCUSATE SODIUM 100 MG PO CAPS: 100.0000 mg | ORAL_CAPSULE | Freq: Two times a day (BID) | ORAL | 0 refills | Status: DC

## 2017-01-07 MED ORDER — OXYCODONE-ACETAMINOPHEN 5-325 MG PO TABS
ORAL_TABLET | ORAL | Status: AC
  Administered 2017-01-07: 1 via ORAL
  Filled 2017-01-07: qty 1

## 2017-01-07 MED ORDER — ATORVASTATIN CALCIUM 10 MG PO TABS: 10.0000 mg | ORAL_TABLET | Freq: Every day | ORAL | 0 refills | Status: DC

## 2017-01-07 MED ORDER — ALTEPLASE 2 MG IJ SOLR
INTRAMUSCULAR | Status: AC
  Administered 2017-01-07: 4 mg
  Filled 2017-01-07: qty 4

## 2017-01-07 NOTE — Progress Notes (Signed)
   2D echo order placed, to be done in 3 weeks, to reassess pericardial effusion.

## 2017-01-07 NOTE — Plan of Care (Signed)
Problem: Education: Goal: Knowledge of [place] [facility] Education information/materials will improve Outcome: Progressing POC reviewed with pt.  Problem: Safety: Goal: Ability to remain free from injury will improve Outcome: Progressing Pt. calls for assist and up with brace/shoe to (R) leg/foot.

## 2017-01-07 NOTE — Progress Notes (Signed)
Patient discharged home following hemodialysis. Discharge instructions reviewed with patient and wife. Teach back used to verify understanding of information provided. NSL discontinued with catheter intact. Patient escorted out via wheelchair and RN escort. Bartholomew Crews, RN

## 2017-01-07 NOTE — Discharge Summary (Signed)
Physician Discharge Summary  James Burke TTS:[id number] DOB: Aug 09, 1966 DOA: 12/29/2016  PCP: [name], MD  Admit date: 12/29/2016 Discharge date: 01/07/2017  Admitted From: Home  Disposition:  Home  Recommendations for Outpatient Follow-up:  1. Follow up with hemodialysis at [facility] .1st treatment 01/10/17 at 6:00 am .Schedule and Time Tues,Thurs,Sat at 6:00 am. 2. Follow-up with cardiology in 3 weeks for a repeat echocardiogram 3. Follow up with vascular outpatient in 4-6 weeks and check duplex prior to it for maturation of his fistula 4. PCP in 1-2 weeks to discuss DM management now that he is on HD 5. f/u body fluid culture from pericardiocentesis  Home Health:  None  Equipment/Devices:  Right tunneled HD catheter  Discharge Condition:  Stable, improved CODE STATUS:  Full code  Diet recommendation:  Diabetic   Brief/Interim Summary:  The patient is a 51 year old male with history of diabetes mellitus type 2, Charcot foot on the right side, congestive heart failure with an ejection fraction of 35-40 percent, stage III chronic kidney disease, who was admitted on 12/29/2016 secondary to shortness of breath.  He initially presented to [facility] but was found to have acute on chronic kidney injury and acute on chronic combined CHF requiring nephrology consultation so he was transferred to [facility] for ongoing therapy.  Echocardiogram demonstrated a moderate to large pericardial effusion.  Cardiology was consulted.  He had serial echocardiograms which demonstrated worsening tamponade-like physiology and so he underwent pericardiocentesis on 5/1 with removal of a liter of fluid. A drain was left in place however it was very painful and so it was removed on 5/2.  Repeat echocardiogram on 5/4 demonstrated a stable moderate sized pericardial effusion that was not enlarging compared to prior. He recommended follow-up echocardiogram in  3 weeks.  It is unclear what the etiology of the pericardial effusion is, possibly viral. He did have acute on chronic kidney injury but his BUN was not extremely elevated. Nephrology and cardiology felt it was best to treat for possible uremic pericardial effusion and so a hemodialysis catheter and left arm fistula were placed on 5/1 and he started hemodialysis. He has been accepted to a hemodialysis center on a Tuesday, Thursday, Saturday schedule.   Discharge Diagnoses:  Principal Problem:   Acute on chronic combined systolic and diastolic congestive heart failure (HCC) Active Problems:   Essential hypertension, benign   Pericardial effusion   Charcot foot due to diabetes mellitus (HCC)   Acute renal failure superimposed on chronic kidney disease (HCC)   BPH (benign prostatic hyperplasia)   AKI (acute kidney injury) ([place])   Hypertensive heart and chronic kidney disease with heart failure and stage 1 through stage 4 chronic kidney disease, or chronic kidney disease (HCC)   ESRD (end stage renal disease) (HCC)   Pericardial effusion without cardiac tamponade  Acute combined systolic and diastolic congestive heart failure, EF 25-30% -  Volume management with dialysis -  Continue beta blocker -  Continue hydralazine  Acute on chronic large pericardial effusion status post pericardiocentesis on 5/1 and removal of pericardial drain on 5/2.  Chest pain and elevated troponin likely related to large pericardial effusion - Repeat limited echocardiogram on 5/4 demonstrated stable moderate sized pericardial effusion - Continue oxycodone for pain control  New end-stage renal disease, likely due to hypertension and diabetes.  ANCA was only minimally elevated  Patient followed in the outpatient setting by Dr. Posey Pronto.  Following up at [facility]  also for possible transplant. Follow up with vascular outpatient in 4-6 weeks and check duplex prior to it for maturation of his fistula Clipped for dialysis  Center on Tuesday  Essential hypertension - increase to amlodipine 10mg  po daily  -  Continued Coreg -  Added hydralazine  Diabetes mellitus type 2 with Charcot foot: Last hemoglobin A1c noted to be 5.2 on 10/2016.  At risk for hypoglycemia as ESRD can increase active metabolites of both of his medications.   -  Discontinued glipizide and trulicity for now until he can follows up with PCP to discuss further management  Hyperlipidemia:  Patient reports no longer being on atorvastatin.  BPH, stable, continue Flomax   Leukocytosis - resolved    Discharge Instructions  Discharge Instructions    (HEART FAILURE PATIENTS) Call MD:  Anytime you have any of the following symptoms: 1) 3 pound weight gain in 24 hours or 5 pounds in 1 week 2) shortness of breath, with or without a dry hacking cough 3) swelling in the hands, feet or stomach 4) if you have to sleep on extra pillows at night in order to breathe.    Complete by:  As directed    Call MD for:  difficulty breathing, headache or visual disturbances    Complete by:  As directed    Call MD for:  extreme fatigue    Complete by:  As directed    Call MD for:  hives    Complete by:  As directed    Call MD for:  persistant dizziness or light-headedness    Complete by:  As directed    Call MD for:  persistant nausea and vomiting    Complete by:  As directed    Call MD for:  redness, tenderness, or signs of infection (pain, swelling, redness, odor or green/yellow discharge around incision site)    Complete by:  As directed    Call MD for:  severe uncontrolled pain    Complete by:  As directed    Call MD for:  temperature >100.4    Complete by:  As directed    Diet Carb Modified    Complete by:  As directed    Increase activity slowly    Complete by:  As directed        Medication List    STOP taking these medications   furosemide 40 MG tablet Commonly known as:  LASIX   glipiZIDE 5 MG tablet Commonly known as:   GLUCOTROL   KLOR-CON M20 20 MEQ tablet Generic drug:  potassium chloride SA   TRULICITY 1.60 VP/7.1GG Sopn Generic drug:  Dulaglutide   Vitamin D 2000 units Caps     TAKE these medications   AMBULATORY NON FORMULARY MEDICATION Medication Name: glucometer, strips and lancet Dx 250.02 Test twice a day.   amLODipine 10 MG tablet Commonly known as:  NORVASC Take 1 tablet (10 mg total) by mouth daily. What changed:  medication strength  how much to take   aspirin 81 MG tablet Take 81 mg by mouth daily.   atorvastatin 10 MG tablet Commonly known as:  LIPITOR Take 1 tablet (10 mg total) by mouth daily at 6 PM. What changed:  when to take this   carvedilol 25 MG tablet Commonly known as:  COREG TAKE 1 TABLET (25 MG TOTAL) BY MOUTH 2 (TWO) TIMES DAILY WITH A MEAL.   docusate sodium 100 MG capsule Commonly known as:  COLACE Take 1 capsule (100 mg total)  by mouth 2 (two) times daily.   hydrALAZINE 25 MG tablet Commonly known as:  APRESOLINE Take 1 tablet (25 mg total) by mouth 3 (three) times daily.   oxyCODONE-acetaminophen 10-325 MG tablet Commonly known as:  PERCOCET Take 1 tablet by mouth every 8 (eight) hours as needed for pain.   polyethylene glycol powder powder Commonly known as:  MIRALAX Take 17 g by mouth daily.   tamsulosin 0.4 MG Caps capsule Commonly known as:  FLOMAX TAKE 2 CAPSULES BY MOUTH DAILY AFTER SUPPER   zolpidem 10 MG tablet Commonly known as:  AMBIEN TAKE 1 TABLET AT BEDTIME AS NEEDED      Follow-up Information    Conrad Victoria, MD Follow up in 4 week(s).   Specialties:  Vascular Surgery, Cardiology Why:  Our office will call you to arrange an appointment  Contact information: [place] [address] [phone]        Lelon Perla, MD Follow up.   Specialty:  Cardiology Why:  our office will call you with appointments to get a repeat ultrasound in 3 weeks and a follow up office appointment with Dr. Stanford Breed or  his assistant in 3-4 weeks.  Contact information: [address] [place] [address] [phone]        [name], MD. Schedule an appointment as soon as possible for a visit in 2 week(s).   Specialty:  Family Medicine Contact information: [place] [address] [phone]          Allergies  Allergen Reactions  . Methocarbamol Diarrhea  . Prednisone     Other reaction(s): Other blindness  . Simvastatin Other (See Comments)    Joint aches.     Consultations: Cardiology Nephrology Vascular surgery   Procedures/Studies: US Renal  Result Date: 12/30/2016 CLINICAL DATA:  Acute renal injury EXAM: RENAL / URINARY TRACT ULTRASOUND COMPLETE COMPARISON:  None. FINDINGS: Right Kidney: Length: 10.9 cm. Echogenicity within normal limits. No mass or hydronephrosis visualized. Left Kidney: Length: 10.7 cm. Echogenicity within normal limits. No mass or hydronephrosis visualized. Bladder: Decompressed. Small right-sided pleural effusion is noted. IMPRESSION: Kidneys are within normal limits. Small right-sided pleural effusion. Electronically Signed   By: Inez Catalina M.D.   On: 12/30/2016 12:19   Dg Chest Port 1 View  Result Date: 01/03/2017 CLINICAL DATA:  Status post dialysis catheter placement. EXAM: PORTABLE CHEST 1 VIEW COMPARISON:  PA and lateral chest 06/04/2014. FINDINGS: Right IJ approach double lumen central venous catheter is in place. Tip of the catheter projects just within the right atrium. There is no pneumothorax. Cardiomegaly is seen. No pulmonary edema. No pleural effusion. IMPRESSION: Dialysis catheter tip projects just within the right atrium. Negative for pneumothorax. Cardiomegaly without edema. Electronically Signed   By: Inge Rise M.D.   On: 01/03/2017 14:01   Dg Fluoro Guide Cv Line-no Report  Result Date: 01/03/2017 Fluoroscopy was utilized by the requesting physician.  No radiographic  interpretation.    Subjective:  Frustrated because his hemodialysis catheter was not working well this money for dialysis and he had to use alteplase to improve flow. He continues to have chest pains but they are getting somewhat better. Eating and drinking well. Had a bowel movement today.  Discharge Exam: Vitals:   01/07/17 1430 01/07/17 1500  BP: 113/70 120/72  Pulse: 71 71  Resp: 15 13  Temp:     Vitals:   01/07/17 1406 01/07/17 1411 01/07/17 1430  01/07/17 1500  BP: 130/76  113/70 120/72  Pulse: 76  71 71  Resp: 16 15 15 13   Temp:      TempSrc:      SpO2:      Weight:      Height:        General exam:  Adult Male.  No acute distress.  HEENT:  NCAT, MMM Respiratory system: CTAB Cardiovascular system: Regular rate and rhythm, normal S1/S2. No murmurs, rubs, gallops or clicks.  Warm extremities.  Right chest tunneled hemodialysis catheter appears clean and dry, no surrounding erythema. Left arm fistula incision site also minimally erythematous, appears to be healing well. Gastrointestinal system: Normal active bowel sounds, soft, nondistended, nontender. MSK:  Normal tone and bulk, 1+ pitting bilateral lower extremity edema Neuro:  Grossly intact   The results of significant diagnostics from this hospitalization (including imaging, microbiology, ancillary and laboratory) are listed below for reference.     Microbiology: Recent Results (from the past 240 hour(s))  Surgical pcr screen     Status: Abnormal   Collection Time: 01/02/17  5:24 PM  Result Value Ref Range Status   MRSA, PCR NEGATIVE NEGATIVE Final   Staphylococcus aureus POSITIVE (A) NEGATIVE Final    Comment:        The Xpert SA Assay (FDA approved for NASAL specimens in patients over 30 years of age), is one component of a comprehensive surveillance program.  Test performance has been validated by [facility] for patients greater than or equal to 69 year old. It is not intended to diagnose infection  nor to guide or monitor treatment.   Gram stain     Status: None   Collection Time: 01/03/17  4:16 PM  Result Value Ref Range Status   Specimen Description FLUID PERICARDIAL  Final   Special Requests NONE  Final   Gram Stain   Final    WBC PRESENT, PREDOMINANTLY MONONUCLEAR NO ORGANISMS SEEN CYTOSPIN SMEAR    Report Status 01/03/2017 FINAL  Final  Culture, body fluid-bottle     Status: None (Preliminary result)   Collection Time: 01/03/17  4:16 PM  Result Value Ref Range Status   Specimen Description FLUID PERICARDIAL  Final   Special Requests BOTTLES DRAWN AEROBIC AND ANAEROBIC  Final   Culture NO GROWTH 4 DAYS  Final   Report Status PENDING  Incomplete     Labs: BNP (last 3 results)  Recent Labs  12/30/16 0042  BNP 9,892.1*   Basic Metabolic Panel:  Recent Labs Lab 01/03/17 0356 01/04/17 0322 01/05/17 0453 01/05/17 1101 01/06/17 0500 01/07/17 0335  NA 136 136 132*  --  134* 131*  131*  K 3.4* 3.8 3.8  --  4.3 4.1  4.1  CL 101 102 97*  --  96* 93*  93*  CO2 25 18* 23  --  24 23  23   GLUCOSE 84 88 147*  --  146* 132*  133*  BUN 78* 80* 55*  --  59* 78*  77*  CREATININE 6.81* 6.87* 5.65*  --  5.72* 6.55*  6.59*  CALCIUM 8.5* 8.7* 8.1* 8.2* 8.0* 8.3*  8.3*  MG  --   --  2.1  --  2.1 2.3  PHOS 5.1* 4.7* 4.4  --  5.0* 5.7*   Liver Function Tests:  Recent Labs Lab 01/03/17 0356 01/04/17 0322 01/05/17 0453 01/06/17 0500 01/07/17 0335  ALBUMIN 3.1* 3.2* 2.6* 2.3* 2.5*   No results for input(s): LIPASE, AMYLASE in the last  168 hours. No results for input(s): AMMONIA in the last 168 hours. CBC:  Recent Labs Lab 01/02/17 0314 01/04/17 0322 01/05/17 0453 01/06/17 0500 01/07/17 0335  WBC 5.4 17.6* 11.0* 9.3 7.7  HGB 10.1* 10.9* 10.4* 9.6* 9.2*  HCT 30.6* 32.9* 31.4* 29.6* 28.1*  MCV 81.0 81.4 80.9 82.0 81.2  PLT 173 194 158 150 150   Cardiac Enzymes: No results for input(s): CKTOTAL, CKMB, CKMBINDEX, TROPONINI in the last 168  hours. BNP: Invalid input(s): POCBNP CBG:  Recent Labs Lab 01/06/17 0802 01/06/17 1131 01/06/17 1755 01/06/17 2108 01/07/17 1034  GLUCAP 179* 232* 190* 199* 117*   D-Dimer No results for input(s): DDIMER in the last 72 hours. Hgb A1c No results for input(s): HGBA1C in the last 72 hours. Lipid Profile No results for input(s): CHOL, HDL, LDLCALC, TRIG, CHOLHDL, LDLDIRECT in the last 72 hours. Thyroid function studies No results for input(s): TSH, T4TOTAL, T3FREE, THYROIDAB in the last 72 hours.  Invalid input(s): FREET3 Anemia work up  Recent Labs  01/05/17 1101  FERRITIN 111   Urinalysis    Component Value Date/Time   COLORURINE YELLOW 12/30/2016 [place] 12/30/2016 1115   LABSPEC 1.010 12/30/2016 1115   PHURINE 5.0 12/30/2016 1115   GLUCOSEU 50 (A) 12/30/2016 1115   HGBUR SMALL (A) 12/30/2016 1115   BILIRUBINUR NEGATIVE 12/30/2016 1115   BILIRUBINUR negative 10/24/2014 1659   KETONESUR NEGATIVE 12/30/2016 1115   PROTEINUR 100 (A) 12/30/2016 1115   UROBILINOGEN 0.2 10/24/2014 1659   NITRITE NEGATIVE 12/30/2016 1115   LEUKOCYTESUR NEGATIVE 12/30/2016 1115   Sepsis Labs Invalid input(s): PROCALCITONIN,  WBC,  LACTICIDVEN   Time coordinating discharge: Over 30 minutes  SIGNED:   Janece Canterbury, MD  [facility] 01/07/2017, 4:04 PM Pager   If 7PM-7AM, please contact night-coverage [email or web address] Password [email or web address]

## 2017-01-07 NOTE — Progress Notes (Signed)
Progress Note  Patient Name: James Burke Date of Encounter: 01/07/2017  Primary Cardiologist: Stanford Breed  Subjective   No acute events overnight. Pt examined in the dialysis unit.   He denies any dyspnea, or chest pain. His pleuritic chest pain is greatly improved .  Pt is already clipped on TRSa      Inpatient Medications    Scheduled Meds: . amLODipine  10 mg Oral Daily  . aspirin EC  81 mg Oral Daily  . atorvastatin  10 mg Oral q1800  . carvedilol  25 mg Oral BID WC  . Chlorhexidine Gluconate Cloth  6 each Topical Daily  . Darbepoetin Alfa      . darbepoetin (ARANESP) injection - DIALYSIS  60 mcg Intravenous Q Sat-HD  . docusate sodium  100 mg Oral BID  . Dulaglutide  0.75 mg Subcutaneous Weekly  . hydrALAZINE  25 mg Oral TID  . insulin aspart  0-5 Units Subcutaneous QHS  . insulin aspart  0-9 Units Subcutaneous TID WC  . mupirocin ointment  1 application Nasal BID  . oxyCODONE      . oxyCODONE-acetaminophen      . potassium chloride SA  20 mEq Oral Daily  . sodium chloride flush  3 mL Intravenous Q12H  . sodium chloride flush  3 mL Intravenous Q12H  . tamsulosin  0.4 mg Oral BID   Continuous Infusions: . sodium chloride 500 mL (01/03/17 1018)  . sodium chloride    . sodium chloride    . sodium chloride    . sodium chloride     PRN Meds: sodium chloride, sodium chloride, sodium chloride, sodium chloride, acetaminophen, heparin, ondansetron (ZOFRAN) IV, oxyCODONE-acetaminophen **AND** oxyCODONE, polyethylene glycol, sodium chloride flush, sodium chloride flush, zolpidem   Vital Signs    Vitals:   01/07/17 0721 01/07/17 0729 01/07/17 0800 01/07/17 0830  BP: 137/81 137/80 (!) 144/90 (!) 142/85  Pulse: 75 75 73 77  Resp: 14 16 15 16   Temp: 88.4 F (36.8 C)     TempSrc: Oral     SpO2:  99%    Weight: 192 lb 12.8 oz (87.5 kg)     Height:        Intake/Output Summary (Last 24 hours) at 01/07/17 0942 Last data filed at 01/06/17 1900  Gross per 24 hour    Intake              720 ml  Output              150 ml  Net              570 ml   Filed Weights   01/05/17 1312 01/06/17 2110 01/07/17 0721  Weight: 179 lb 7.3 oz (81.4 kg) 194 lb 8 oz (88.2 kg) 192 lb 12.8 oz (87.5 kg)    Telemetry    NSR in the 70's  Physical Exam   GEN: No acute distress, examined in dialysis session.  Neck: No JVD, R IJ HD cath looks healthy Cardiac: RRR, no murmurs, rubs, or gallops. Pericardial drain site without drainage Respiratory: Clear to auscultation bilaterally. GI: Soft, nontender, non-distended  MS: No edema; Charcot foot on right. New AV fistula left arm Neuro:  Nonfocal  Psych: Normal affect   Labs    Chemistry  Recent Labs Lab 01/05/17 0453 01/05/17 1101 01/06/17 0500 01/07/17 0335  NA 132*  --  134* 131*  131*  K 3.8  --  4.3 4.1  4.1  CL 97*  --  96* 93*  93*  CO2 23  --  24 23  23   GLUCOSE 147*  --  146* 132*  133*  BUN 55*  --  59* 78*  77*  CREATININE 5.65*  --  5.72* 6.55*  6.59*  CALCIUM 8.1* 8.2* 8.0* 8.3*  8.3*  ALBUMIN 2.6*  --  2.3* 2.5*  GFRNONAA 11*  --  11* 9*  9*  GFRAA 12*  --  12* 10*  10*  ANIONGAP 12  --  14 15  15      Hematology  Recent Labs Lab 01/05/17 0453 01/06/17 0500 01/07/17 0335  WBC 11.0* 9.3 7.7  RBC 3.88* 3.61* 3.46*  HGB 10.4* 9.6* 9.2*  HCT 31.4* 29.6* 28.1*  MCV 80.9 82.0 81.2  MCH 26.8 26.6 26.6  MCHC 33.1 32.4 32.7  RDW 14.2 14.3 14.0  PLT 158 150 150    Cardiac Enzymes No results for input(s): TROPONINI in the last 168 hours. No results for input(s): TROPIPOC in the last 168 hours.   BNP No results for input(s): BNP, PROBNP in the last 168 hours.   DDimer No results for input(s): DDIMER in the last 168 hours.   Radiology    No results found.  Cardiac Studies   Echo 5/2 Repeat echo interpreted as moderate, mostly posterior and lateral effusion, possibly loculated. On further review, effusion is small. There is no tamponade. EF remains 25-30%.  Echo  5/4  - Left ventricle: Systolic function was severely reduced. The   estimated ejection fraction was in the range of 25% to 30%.   Diffuse hypokinesis. - Left atrium: The atrium was mildly dilated. - Tricuspid valve: There was moderate regurgitation. - Pulmonary arteries: PA peak pressure: 48 mm Hg (S). - Pericardium, extracardiac: Moderate-sized pericardial effusion.   Cannot exclude tamponde physiology due to the degree of   cardiomyopathy.  - Compared to the prior study on 01/04/2017, there has been no   significant interval change in the pericardial effusion.   Patient Profile     51 y.o. 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 Presented with acute CHF exacerbation, large pericardial effusion and worsened renal function. No clear tamponade, but decision was made to drain the effusion due to impending need to initiate HD and concern about circulatory instability with volume removal.  Assessment & Plan    1. Acute on chronic systolic and diastolic heart failure: The dyspnea that led to his admission has been resolved with diuresis and was secondary to hypovolemia/decompensated combined acute on chronic systolic and diastolic heart failure. He has a long-standing history of cardiomyopathy, dating back at least 2007, with estimated left ventricular ejection fraction varying from 15% to 45%.  Has had 2 HD treatments and one HD today. On high dose carvedilol and hydralazine. Is on carvedilol 25 mg bid, hydralazine 25 mg tid, and lasix.   -continue lasix, coreg, hydral at current doses  2. Pericardial effusion Pt had pericardiocentesis on 01/03/17 with removal of 1L fluid. Drain has been removed. No significance to fluid analysis per lab. The hope is that pt is on temporary dialysis and that his renal function will recover, and so would like to avoid NSAIDs and other nephrotoxic agents Repeat echo shows  that the pericardial effusion is stable and there is no interval change.   -avoid NSAID -has steroid allergy  -colchicine has been shown to help with pericardial effusion- renally adjusted dose  -follow  up in cardiology office, and echo in 3 weeks   3. CKD stage IV Nephrology following. Now on hemodialysis via right IJ catheter. Also had AV fistula placed in left arm. Has had 2 HD treatments,  4. Hypertension -On amlodipine, carvedilol, lasix, hydralazine.    Signed, Burgess Estelle, MD  01/07/2017, 9:42 AM

## 2017-01-08 LAB — CULTURE, BODY FLUID W GRAM STAIN -BOTTLE: Culture: NO GROWTH

## 2017-01-09 DIAGNOSIS — D631 Anemia in chronic kidney disease: Secondary | ICD-10-CM | POA: Insufficient documentation

## 2017-01-09 DIAGNOSIS — N189 Chronic kidney disease, unspecified: Secondary | ICD-10-CM | POA: Insufficient documentation

## 2017-01-14 DIAGNOSIS — D689 Coagulation defect, unspecified: Secondary | ICD-10-CM | POA: Insufficient documentation

## 2017-01-21 ENCOUNTER — Other Ambulatory Visit: Payer: Self-pay | Admitting: Family Medicine

## 2017-01-23 DIAGNOSIS — Z992 Dependence on renal dialysis: Secondary | ICD-10-CM | POA: Insufficient documentation

## 2017-01-25 ENCOUNTER — Encounter: Payer: Self-pay | Admitting: Vascular Surgery

## 2017-01-25 ENCOUNTER — Other Ambulatory Visit (HOSPITAL_COMMUNITY): Payer: BLUE CROSS/BLUE SHIELD

## 2017-01-26 DIAGNOSIS — D509 Iron deficiency anemia, unspecified: Secondary | ICD-10-CM | POA: Insufficient documentation

## 2017-01-31 ENCOUNTER — Encounter: Payer: Self-pay | Admitting: Cardiology

## 2017-01-31 DIAGNOSIS — T82818D Embolism of vascular prosthetic devices, implants and grafts, subsequent encounter: Secondary | ICD-10-CM | POA: Insufficient documentation

## 2017-01-31 NOTE — Progress Notes (Signed)
    Postoperative Access Visit   History of Present Illness   James Burke is a 51 y.o. year old male who presents for postoperative follow-up for: left single stage brachial vein transposition (Date: 01/03/17).  The patient's wounds are healed.  The patient notes no steal symptoms.  The patient is able to complete their activities of daily living.  The patient's current symptoms are: none.   Physical Examination   Vitals:   02/03/17 1222  BP: 138/78  Pulse: 85  Resp: 20  Temp: 98.6 F (37 C)  TempSrc: Oral  SpO2: 91%  Weight: 201 lb 4.8 oz (91.3 kg)  Height: 6' (1.829 m)    LUE: Incision is healed, skin feels warm, hand grip is 5/5, sensation in digits is intact, palpable thrill, bruit can be auscultated, On Sonosite: fistula only 5 mm, multiple veins appear dilated   Medical Decision Making   James Burke is a 51 y.o. year old male who presents s/p left single stage brachial vein transposition   Patient followed up too early so not too surprised fistula has not matured  Patient will follow up in one month for re-check  Thank you for allowing [place] to participate in this patient's care.  Adele Barthel, MD, FACS [facility] Office: [phone] Pager: [phone]

## 2017-02-03 ENCOUNTER — Ambulatory Visit (INDEPENDENT_AMBULATORY_CARE_PROVIDER_SITE_OTHER): Payer: Self-pay | Admitting: Vascular Surgery

## 2017-02-03 ENCOUNTER — Encounter: Payer: Self-pay | Admitting: Vascular Surgery

## 2017-02-03 VITALS — BP 138/78 | HR 85 | Temp 98.6°F | Resp 20 | Ht 72.0 in | Wt 201.3 lb

## 2017-02-03 DIAGNOSIS — N186 End stage renal disease: Secondary | ICD-10-CM

## 2017-02-06 ENCOUNTER — Encounter: Payer: Self-pay | Admitting: Family Medicine

## 2017-02-06 NOTE — Addendum Note (Signed)
Addendum  created 02/06/17 1439 by Oleta Mouse, MD   Sign clinical note

## 2017-02-07 ENCOUNTER — Other Ambulatory Visit: Payer: Self-pay | Admitting: Family Medicine

## 2017-02-16 ENCOUNTER — Encounter: Payer: Self-pay | Admitting: Family Medicine

## 2017-02-17 ENCOUNTER — Encounter: Payer: Self-pay | Admitting: Vascular Surgery

## 2017-02-17 MED ORDER — GLIPIZIDE 5 MG PO TABS: 5.0000 mg | ORAL_TABLET | Freq: Two times a day (BID) | ORAL | 1 refills | Status: DC

## 2017-02-18 ENCOUNTER — Other Ambulatory Visit: Payer: Self-pay | Admitting: Family Medicine

## 2017-02-20 ENCOUNTER — Other Ambulatory Visit: Payer: Self-pay

## 2017-02-20 MED ORDER — ZOLPIDEM TARTRATE 10 MG PO TABS: 10.0000 mg | ORAL_TABLET | Freq: Every evening | ORAL | 0 refills | Status: DC | PRN

## 2017-02-20 MED ORDER — GLIPIZIDE 5 MG PO TABS: 5.0000 mg | ORAL_TABLET | Freq: Two times a day (BID) | ORAL | 1 refills | Status: DC

## 2017-02-22 ENCOUNTER — Ambulatory Visit: Payer: BLUE CROSS/BLUE SHIELD | Admitting: Family Medicine

## 2017-03-01 ENCOUNTER — Encounter: Payer: Self-pay | Admitting: Family Medicine

## 2017-03-01 ENCOUNTER — Ambulatory Visit (INDEPENDENT_AMBULATORY_CARE_PROVIDER_SITE_OTHER): Payer: BLUE CROSS/BLUE SHIELD | Admitting: Family Medicine

## 2017-03-01 VITALS — BP 121/69 | HR 87 | Ht 72.0 in | Wt 195.0 lb

## 2017-03-01 DIAGNOSIS — E1122 Type 2 diabetes mellitus with diabetic chronic kidney disease: Secondary | ICD-10-CM

## 2017-03-01 DIAGNOSIS — K2900 Acute gastritis without bleeding: Secondary | ICD-10-CM

## 2017-03-01 DIAGNOSIS — N184 Chronic kidney disease, stage 4 (severe): Secondary | ICD-10-CM

## 2017-03-01 LAB — POCT GLYCOSYLATED HEMOGLOBIN (HGB A1C): Hemoglobin A1C: 5.5

## 2017-03-01 MED ORDER — OMEPRAZOLE 40 MG PO CPDR: 40.0000 mg | DELAYED_RELEASE_CAPSULE | Freq: Every day | ORAL | 1 refills | Status: DC

## 2017-03-01 NOTE — Progress Notes (Signed)
Subjective:    CC: DM  HPI: Diabetes - no hypoglycemic events. No wounds or sores that are not healing well. No increased thirst or urination. Checking glucose at home. Taking medications as prescribed without any side effects.  ESRD - He recently had a brachial vein transposition for dialysis. He has been urinating well and taking Lasix on his days where he is not on dialysis. Recheck his labs on Friday and he is hoping that he may be able come back off dialysis at least for a period of time.  He also complains of some stomach issues. Ever since he was hospitalized in April and release in early May he has been having some nausea and epigastric pain. He says soon as he starts to eat he'll start to cough and gag and feel nauseated and have epigastric pain. He does still have a gallbladder. While hospitalized he actually had a pericardial infusion and had a pericardiocentesis as well as a drain placed. He says the drain was extremely painful and when his stomach hurts it almost feels like when he has the had the drain in previously. So he's not sure if this is related or not.  Past medical history, Surgical history, Family history not pertinant except as noted below, Social history, Allergies, and medications have been entered into the medical record, reviewed, and corrections made.   Review of Systems: No fevers, chills, night sweats, weight loss, chest pain, or shortness of breath.   Objective:    General: Well Developed, well nourished, and in no acute distress.  Neuro: Alert and oriented x3, extra-ocular muscles intact, sensation grossly intact.  HEENT: Normocephalic, atraumatic  Skin: Warm and dry, no rashes. Cardiac: Regular rate and rhythm, no murmurs rubs or gallops, no lower extremity edema.  Respiratory: Clear to auscultation bilaterally. Not using accessory muscles, speaking in full sentences.   Impression and Recommendations:    Diabetes - Very well controlled. If anything  monitor for hypoglycemia. Follow-up in 3-4 months. Lab Results  Component Value Date   HGBA1C 5.5 03/01/2017    End-stage renal disease-hoping that his renal function is normal at that he will be able to come off of dialysis for the short-term. He says he's been urinating well and responding to every other day Lasix.  Epigastric pain-suspect either gastritis GERD or possibly ulcer. Will start him on a PPI. I want him to call me in a week if it's not helping. He should have a fairly quick response of this type of medication really within a few days if it's helping. If any of his symptoms persist including the nausea them please let me know. Did encourage him to get his echo rescheduled to take another look at his heart to make sure that he has not had a recent buildup of fluid again. He had it scheduled but unfortunately had to reschedule it. Also consider GI referral if not improving.

## 2017-03-03 ENCOUNTER — Other Ambulatory Visit: Payer: Self-pay | Admitting: *Deleted

## 2017-03-03 ENCOUNTER — Encounter: Payer: Self-pay | Admitting: Vascular Surgery

## 2017-03-03 ENCOUNTER — Ambulatory Visit (INDEPENDENT_AMBULATORY_CARE_PROVIDER_SITE_OTHER): Payer: Self-pay | Admitting: Vascular Surgery

## 2017-03-03 VITALS — BP 144/91 | HR 90 | Temp 99.3°F | Resp 20 | Ht 72.0 in | Wt 200.4 lb

## 2017-03-03 DIAGNOSIS — N186 End stage renal disease: Secondary | ICD-10-CM

## 2017-03-03 MED ORDER — HYDRALAZINE HCL 25 MG PO TABS: 25.0000 mg | ORAL_TABLET | Freq: Three times a day (TID) | ORAL | 1 refills | Status: DC

## 2017-03-03 MED ORDER — AMLODIPINE BESYLATE 10 MG PO TABS: 10.0000 mg | ORAL_TABLET | Freq: Every day | ORAL | 1 refills | Status: DC

## 2017-03-03 MED ORDER — ATORVASTATIN CALCIUM 10 MG PO TABS: 10.0000 mg | ORAL_TABLET | Freq: Every day | ORAL | 1 refills | Status: DC

## 2017-03-03 NOTE — Progress Notes (Signed)
    Postoperative Access Visit   History of Present Illness   James Burke is a 51 y.o. year old male who presents for postoperative follow-up for: left first stage brachial vein transposition (Date: 01/03/17).  The patient's wounds are healed.  The patient notes no steal symptoms.  The patient is able to complete their activities of daily living.  The patient's current symptoms are: none.  Pt's renal function is recovering.  Reportedly Renal is considering discontinuing his HD.   Physical Examination   Vitals:   03/03/17 1557  BP: (!) 144/91  Pulse: 90  Resp: 20  Temp: 99.3 F (37.4 C)  TempSrc: Oral  SpO2: 95%  Weight: 200 lb 6.4 oz (90.9 kg)  Height: 6' (1.829 m)    LUE: Incision is healed, skin feels warm, hand grip is 5/5, sensation in digits is intact, palpable thrill, bruit can be auscultated, on Sonosite: web distal brachial veins, fistula 5-6 mm in diameter   Medical Decision Making   James Burke is a 51 y.o. year old male who presents s/p left first stage brachial vein transposition   L 1st stage BRVT appears to be be dilating appropriately but would benefit from more time.  Pt might be off HD when he comes back in 4 weeks.    In case the pt discontinues HD, I would leave the L 1st BRVT in place and forgo the transposition for now given the morbidity of the transposition.  Thank you for allowing [place] to participate in this patient's care.   Adele Barthel, MD, FACS [facility] Office: [phone] Pager: [phone]

## 2017-03-06 ENCOUNTER — Encounter: Payer: Self-pay | Admitting: Family Medicine

## 2017-03-07 ENCOUNTER — Other Ambulatory Visit: Payer: Self-pay | Admitting: *Deleted

## 2017-03-07 MED ORDER — FUROSEMIDE 40 MG PO TABS: ORAL_TABLET | ORAL | 1 refills | Status: DC

## 2017-03-09 NOTE — Progress Notes (Signed)
HPI: FU congestive heart failure. Nuclear study February 2016 showed ejection fraction 32%. There is diaphragmatic attenuation but no ischemia. I have not seen the patient since 2016. He was admitted in April 2018 for congestive heart failure, pericardial effusion and acute on chronic renal insufficiency. Echocardiogram showed severely reduced LV function with EF 15-20, severe LVH and LVE, grade 2 DD, mild MR, moderate to severe LAE, moderately reduced RV function, severely elevated pulmonary pressure and large pericardial effusion. He ultimately underwent pericardiocentesis. Cultures and cytology negative. Etiology of pericardial effusion of unclear and was not felt to all be secondary to uremia. Question viral pericarditis. Follow-up echocardiogram in May showed moderate pericardial effusion predominantly posterior and lateral and also variable density and chest CT was recommended. Patient was initiated on dialysis. It was unclear whether this would be long-term or temporary. Since he was last seen, he denies dyspnea but has weakness. No chest pain, palpitations or syncope. Overall he has improved.  Current Outpatient Prescriptions  Medication Sig Dispense Refill  . amLODipine (NORVASC) 10 MG tablet Take 1 tablet (10 mg total) by mouth daily. 90 tablet 1  . aspirin 81 MG tablet Take 81 mg by mouth daily.    . carvedilol (COREG) 25 MG tablet TAKE 1 TABLET (25 MG TOTAL) BY MOUTH 2 (TWO) TIMES DAILY WITH A MEAL. 180 tablet 1  . docusate sodium (COLACE) 100 MG capsule Take 1 capsule (100 mg total) by mouth 2 (two) times daily. 10 capsule 0  . furosemide (LASIX) 40 MG tablet 40 mg. On non-dialysis days take two tablets by mouth in the am and if needed one tablet in the afternoon    . glipiZIDE (GLUCOTROL) 5 MG tablet Take 5mg  tablet by mouth in the morning and 2.5mg  in the evening    . hydrALAZINE (APRESOLINE) 25 MG tablet Take 1 tablet (25 mg total) by mouth 3 (three) times daily. 90 tablet 1  .  omeprazole (PRILOSEC) 40 MG capsule Take 1 capsule (40 mg total) by mouth daily. 30 capsule 1  . oxyCODONE-acetaminophen (PERCOCET) 10-325 MG per tablet Take 1 tablet by mouth every 8 (eight) hours as needed for pain.     . polyethylene glycol powder (MIRALAX) powder Take 17 g by mouth daily. 850 g 0  . tamsulosin (FLOMAX) 0.4 MG CAPS capsule TAKE 2 CAPSULES BY MOUTH DAILY AFTER SUPPER 180 capsule 2  . TRULICITY 1.61 WR/6.0AV SOPN INJECT 0.75MG  EVERY 7 DAYS  6  . zolpidem (AMBIEN) 10 MG tablet Take 1 tablet (10 mg total) by mouth at bedtime as needed. 90 tablet 0  . AMBULATORY NON FORMULARY MEDICATION Medication Name: glucometer, strips and lancet Dx 250.02 Test twice a day. 100 Units pRN   No current facility-administered medications for this visit.      Past Medical History:  Diagnosis Date  . 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 C. difficile colitis 04/18/2008  . Congestive heart failure ([place])   . Diabetes mellitus   . DVT (deep venous thrombosis) ([facility])    right leg   . GERD (gastroesophageal reflux disease)   . Renal insufficiency     Past Surgical History:  Procedure Laterality Date  . APPENDECTOMY  1995  . BASCILIC VEIN TRANSPOSITION Left 01/03/2017   Procedure: FIRST STAGE BRACHIAL VEIN TRANSPOSITION;  Surgeon: Conrad [place], MD;  Location: [facility];  Service: Vascular;  Laterality: Left;  . INSERTION OF DIALYSIS CATHETER Right 01/03/2017   Procedure: INSERTION OF DIALYSIS CATHETER RIGHT INTERNAL  JUGULAR;  Surgeon: Conrad [place], MD;  Location: [facility];  Service: Vascular;  Laterality: Right;  . KNEE SURGERY    . PERICARDIOCENTESIS N/A 01/03/2017   Procedure: Pericardiocentesis;  Surgeon: Burnell Blanks, MD;  Location: [facility];  Service: Cardiovascular;  Laterality: N/A;    Social History   Social History  . Marital status: Married    Spouse name: N/A  . Number of children: 3  . Years of education: N/A   Occupational History  . Not on file.   Social History  Main Topics  . Smoking status: Never Smoker  . Smokeless tobacco: Current User    Types: Chew  . Alcohol use No  . Drug use: No  . Sexual activity: Yes   Other Topics Concern  . Not on file   Social History Narrative  . No narrative on file    Family History  Problem Relation Age of Onset  . Heart disease Unknown        No family history  . Cancer Brother     ROS: Weakness but no fevers or chills, productive cough, hemoptysis, dysphasia, odynophagia, melena, hematochezia, dysuria, hematuria, rash, seizure activity, orthopnea, PND, pedal edema, claudication. Remaining systems are negative.  Physical Exam: Well-developed well-nourished in no acute distress.  Skin is warm and dry.  HEENT is normal.  Neck is supple.  Chest is clear to auscultation with normal expansion. Dialysis catheter right upper chest. Cardiovascular exam is regular rate and rhythm.  Abdominal exam nontender or distended. No masses palpated. Extremities show no edema. neuro grossly intact   A/P  1 Pericardial effusion-I will arrange a follow-up echocardiogram to reassess.   2 Cardiomyopathy-etiology unclear. Previous nuclear study showed no ischemia. No history of alcohol abuse. Ordinarily I would like to proceed with cardiac catheterization to exclude coronary disease. However apparently it is not clear that he will require dialysis long-term. I will therefore avoid contrast for now. If in the future it is clear he will need long-term dialysis with plans to proceed with catheterization at that time. Continue beta blocker. No ACE inhibitor or ARB given renal insufficiency. Continue hydralazine. Add isosorbide mononitrate 30 mg daily. Titrate medicines as tolerated. I doubt he would be a good candidate for ICD given renal insufficiency.  3 renal insufficiency-patient remains on dialysis. Management per nephrology.  4 hypertension-blood pressure is elevated. Add isosorbide and adjust regimen as needed.  5  chronic systolic congestive heart failure-patient is now euvolemic. Volume status is managed by nephrology.    Kirk Ruths, MD

## 2017-03-22 ENCOUNTER — Ambulatory Visit (INDEPENDENT_AMBULATORY_CARE_PROVIDER_SITE_OTHER): Payer: BLUE CROSS/BLUE SHIELD | Admitting: Cardiology

## 2017-03-22 ENCOUNTER — Encounter: Payer: Self-pay | Admitting: Cardiology

## 2017-03-22 VITALS — BP 154/98 | HR 90 | Ht 72.0 in | Wt 193.0 lb

## 2017-03-22 DIAGNOSIS — I313 Pericardial effusion (noninflammatory): Secondary | ICD-10-CM

## 2017-03-22 DIAGNOSIS — I3139 Other pericardial effusion (noninflammatory): Secondary | ICD-10-CM

## 2017-03-22 DIAGNOSIS — I420 Dilated cardiomyopathy: Secondary | ICD-10-CM

## 2017-03-22 DIAGNOSIS — I10 Essential (primary) hypertension: Secondary | ICD-10-CM

## 2017-03-22 MED ORDER — ISOSORBIDE MONONITRATE ER 30 MG PO TB24: 30.0000 mg | ORAL_TABLET | Freq: Every day | ORAL | 3 refills | Status: DC

## 2017-03-22 NOTE — Patient Instructions (Signed)
Medication Instructions:   START ISOSORBIDE 30 MG ONCE DAILY  Follow-Up:  Your physician recommends that you schedule a follow-up appointment in: [place]   If you need a refill on your cardiac medications before your next appointment, please call your pharmacy.

## 2017-03-23 ENCOUNTER — Other Ambulatory Visit (HOSPITAL_COMMUNITY): Payer: BLUE CROSS/BLUE SHIELD

## 2017-03-28 ENCOUNTER — Encounter: Payer: Self-pay | Admitting: Vascular Surgery

## 2017-04-03 ENCOUNTER — Other Ambulatory Visit: Payer: Self-pay | Admitting: *Deleted

## 2017-04-03 ENCOUNTER — Telehealth: Payer: Self-pay | Admitting: *Deleted

## 2017-04-03 ENCOUNTER — Encounter: Payer: Self-pay | Admitting: Family Medicine

## 2017-04-03 MED ORDER — FUROSEMIDE 40 MG PO TABS: 80.0000 mg | ORAL_TABLET | Freq: Two times a day (BID) | ORAL | 0 refills | Status: DC

## 2017-04-03 MED ORDER — FUROSEMIDE 40 MG PO TABS: ORAL_TABLET | ORAL | 0 refills | Status: DC

## 2017-04-03 NOTE — Telephone Encounter (Signed)
Patient called and states his kidney dr changed the dosage of his furosemide and he needs a new rx asap. I advised him that if the kidney dr is authorizing the change then he needs to be the one to write a new prescription. The patient states Dr. Madilyn Fireman has been witting the rx all along. I did explain to him that Dr. Madilyn Fireman is out of the office and if the kidney doctor who manges his kidney function. On his active med list the lasix looks like it has not been prescribed by [place] but on hx can see that we did last fill it in 03/07/17. Patient states he takes 4 a day. He asked that we send in what was last prescribed. rx sent

## 2017-04-05 ENCOUNTER — Other Ambulatory Visit (HOSPITAL_COMMUNITY): Payer: BLUE CROSS/BLUE SHIELD

## 2017-04-10 NOTE — Progress Notes (Signed)
Postoperative Access Visit   History of Present Illness   James Burke is a 51 y.o. (08/21/1966) male who presents for postoperative follow-up for: left first stage brachial vein transposition (Date: 01/03/17).  The patient's wounds are healed.  The patient notes no steal symptoms.  The patient is able to complete their activities of daily living.  The patient's current symptoms are: none.  Pt's renal function is recovering now on HD 2 day/week rather than 3 days/week.  Long term prognosis is that he will require HD.  Past Medical History:  Diagnosis Date  . 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 C. difficile colitis 04/18/2008  . Congestive heart failure ([place])   . Diabetes mellitus   . DVT (deep venous thrombosis) ([facility])    right leg   . GERD (gastroesophageal reflux disease)   . Renal insufficiency     Past Surgical History:  Procedure Laterality Date  . APPENDECTOMY  1995  . BASCILIC VEIN TRANSPOSITION Left 01/03/2017   Procedure: FIRST STAGE BRACHIAL VEIN TRANSPOSITION;  Surgeon: Conrad Bellamy, MD;  Location: [facility];  Service: Vascular;  Laterality: Left;  . INSERTION OF DIALYSIS CATHETER Right 01/03/2017   Procedure: INSERTION OF DIALYSIS CATHETER RIGHT INTERNAL JUGULAR;  Surgeon: Conrad [place], MD;  Location: [facility];  Service: Vascular;  Laterality: Right;  . KNEE SURGERY    . PERICARDIOCENTESIS N/A 01/03/2017   Procedure: Pericardiocentesis;  Surgeon: Burnell Blanks, MD;  Location: [facility];  Service: Cardiovascular;  Laterality: N/A;    Social History   Social History  . Marital status: Married    Spouse name: N/A  . Number of children: 3  . Years of education: N/A   Occupational History  . Not on file.   Social History Main Topics  . Smoking status: Never Smoker  . Smokeless tobacco: Current User    Types: Chew  . Alcohol use No  . Drug use: No  . Sexual activity: Yes   Other Topics Concern  . Not on file   Social History Narrative  . No  narrative on file    Family History  Problem Relation Age of Onset  . Heart disease Unknown        No family history  . Cancer Brother     Current Outpatient Prescriptions  Medication Sig Dispense Refill  . AMBULATORY NON FORMULARY MEDICATION Medication Name: glucometer, strips and lancet Dx 250.02 Test twice a day. 100 Units pRN  . amLODipine (NORVASC) 10 MG tablet Take 1 tablet (10 mg total) by mouth daily. 90 tablet 1  . aspirin 81 MG tablet Take 81 mg by mouth daily.    . carvedilol (COREG) 25 MG tablet TAKE 1 TABLET (25 MG TOTAL) BY MOUTH 2 (TWO) TIMES DAILY WITH A MEAL. 180 tablet 1  . docusate sodium (COLACE) 100 MG capsule Take 1 capsule (100 mg total) by mouth 2 (two) times daily. 10 capsule 0  . furosemide (LASIX) 40 MG tablet Take 2 tablets (80 mg total) by mouth 2 (two) times daily. 40 tablet 0  . glipiZIDE (GLUCOTROL) 5 MG tablet Take 5mg  tablet by mouth in the morning and 2.5mg  in the evening    . hydrALAZINE (APRESOLINE) 25 MG tablet Take 1 tablet (25 mg total) by mouth 3 (three) times daily. 90 tablet 1  . isosorbide mononitrate (IMDUR) 30 MG 24 hr tablet Take 1 tablet (30 mg total) by mouth daily. 90 tablet 3  . omeprazole (PRILOSEC)  40 MG capsule Take 1 capsule (40 mg total) by mouth daily. 30 capsule 1  . oxyCODONE-acetaminophen (PERCOCET) 10-325 MG per tablet Take 1 tablet by mouth every 8 (eight) hours as needed for pain.     . polyethylene glycol powder (MIRALAX) powder Take 17 g by mouth daily. 850 g 0  . tamsulosin (FLOMAX) 0.4 MG CAPS capsule TAKE 2 CAPSULES BY MOUTH DAILY AFTER SUPPER 180 capsule 2  . TRULICITY 8.67 YP/9.5KD SOPN INJECT 0.75MG  EVERY 7 DAYS  6  . zolpidem (AMBIEN) 10 MG tablet Take 1 tablet (10 mg total) by mouth at bedtime as needed. 90 tablet 0   No current facility-administered medications for this visit.      Allergies  Allergen Reactions  . Prednisone     Other reaction(s): Other blindness  . Methocarbamol Diarrhea  . Simvastatin  Other (See Comments)    Joint aches.     REVIEW OF SYSTEMS (negative unless checked):   Cardiac:  []  Chest pain or chest pressure? []  Shortness of breath upon activity? []  Shortness of breath when lying flat? []  Irregular heart rhythm?  Vascular:  []  Pain in calf, thigh, or hip brought on by walking? []  Pain in feet at night that wakes you up from your sleep? []  Blood clot in your veins? []  Leg swelling?  Pulmonary:  []  Oxygen at home? []  Productive cough? []  Wheezing?  Neurologic:  []  Sudden weakness in arms or legs? []  Sudden numbness in arms or legs? []  Sudden onset of difficult speaking or slurred speech? []  Temporary loss of vision in one eye? []  Problems with dizziness?  Gastrointestinal:  []  Blood in stool? []  Vomited blood?  Genitourinary:  []  Burning when urinating? [x]  End stage renal disease-HD: T/R/S   Psychiatric:  []  Major depression  Hematologic:  []  Bleeding problems? []  Problems with blood clotting?  Dermatologic:  []  Rashes or ulcers?  Constitutional:  []  Fever or chills?  Ear/Nose/Throat:  []  Change in hearing? []  Nose bleeds? []  Sore throat?  Musculoskeletal:  []  Back pain? []  Joint pain? []  Muscle pain?   Physical Examination   Vitals:   04/14/17 1231  BP: (!) 150/88  Pulse: 80  Resp: 16  Temp: (!) 97.1 F (36.2 C)  TempSrc: Oral  SpO2: 95%  Weight: 198 lb 1.6 oz (89.9 kg)  Height: 6' (1.829 m)   Pulmonary Sym exp, good air movt, CTAB, no rales, rhonchi, & wheezing  Cardiac RRR, Nl S1, S2, no Murmurs, rubs or gallops  Left Arm Incision is healed, skin feels warm, hand grip is 5/5, sensation in digits is intact, palpable thrill, bruit can be auscultated, on Sonosite: webbed distal brachial veins 5 mm, fistula >6 mm in diameter in proximal 2/3     Medical Decision Making   James Burke is a 51 y.o. ([birth year]) male who presents s/p left first stage brachial vein transposition   At this point, I think the  fistula is mature enough to pursue transposition.  Will schedule left second stage brachial vein transposition on 20 AUG 18.  Thank you for allowing [place] to participate in this patient's care.   Adele Barthel, MD, FACS [facility] Office: [phone] Pager: [phone]

## 2017-04-12 ENCOUNTER — Ambulatory Visit (HOSPITAL_COMMUNITY): Payer: BLUE CROSS/BLUE SHIELD | Attending: Cardiology

## 2017-04-12 ENCOUNTER — Other Ambulatory Visit: Payer: Self-pay

## 2017-04-12 ENCOUNTER — Other Ambulatory Visit: Payer: Self-pay | Admitting: Cardiology

## 2017-04-12 DIAGNOSIS — I3139 Other pericardial effusion (noninflammatory): Secondary | ICD-10-CM

## 2017-04-12 DIAGNOSIS — I313 Pericardial effusion (noninflammatory): Secondary | ICD-10-CM

## 2017-04-14 ENCOUNTER — Ambulatory Visit (INDEPENDENT_AMBULATORY_CARE_PROVIDER_SITE_OTHER): Payer: Self-pay | Admitting: Vascular Surgery

## 2017-04-14 ENCOUNTER — Encounter: Payer: Self-pay | Admitting: Vascular Surgery

## 2017-04-14 VITALS — BP 150/88 | HR 80 | Temp 97.1°F | Resp 16 | Ht 72.0 in | Wt 198.1 lb

## 2017-04-14 DIAGNOSIS — N186 End stage renal disease: Secondary | ICD-10-CM

## 2017-04-19 ENCOUNTER — Other Ambulatory Visit: Payer: Self-pay

## 2017-04-20 ENCOUNTER — Other Ambulatory Visit: Payer: Self-pay | Admitting: *Deleted

## 2017-04-20 MED ORDER — OMEPRAZOLE 40 MG PO CPDR: 40.0000 mg | DELAYED_RELEASE_CAPSULE | Freq: Every day | ORAL | 1 refills | Status: DC

## 2017-04-21 ENCOUNTER — Encounter (HOSPITAL_COMMUNITY): Payer: Self-pay | Admitting: Vascular Surgery

## 2017-04-21 NOTE — Progress Notes (Signed)
Mr James Burke denies chest pain , he does become short of breath when he has too much fluid on him.  Patient is scheduled for hemodialysis 2 days a week, Tuesday and Thursday.  Patient missed Thursday dialysis treatment  But is going on Saturday.  Patient has Type II diabetes, last A1C drawn in June was 5.5.  Patient reports that CBGs run 95 - 120.  I instructed patient to not take Glipizide Sunday evening or Monday morning.  I instructed patient to check CBG after awaking and every 2 hours until arrival  to the hospital.  I Instructed patient if CBG is less than 70 to take 4 Glucose Tablets.. Recheck CBG in 15 minutes then call pre- op desk at [phone] for further instructions.

## 2017-04-21 NOTE — Anesthesia Preprocedure Evaluation (Addendum)
Anesthesia Evaluation    Reviewed: Allergy & Precautions, Patient's Chart, lab work & pertinent test results  Airway Mallampati: II  TM Distance: >3 FB Neck ROM: Full    Dental  (+) Teeth Intact, Dental Advisory Given   Pulmonary shortness of breath,    Pulmonary exam normal breath sounds clear to auscultation       Cardiovascular hypertension, Pt. on medications and Pt. on home beta blockers (-) angina+CHF  (-) Past MI Normal cardiovascular exam Rhythm:Regular Rate:Normal  Echo 04/12/17: Impressions:  - Severe LV systolic dysfunction LVEF 20-25%. Restrictive pattern of diastolic dysfunction. Severely decreased RVEF. Moderate pulmonary hypertension.   Neuro/Psych negative neurological ROS  negative psych ROS   GI/Hepatic Neg liver ROS, GERD  Medicated,  Endo/Other  diabetes, Type 2, Oral Hypoglycemic Agents  Renal/GU Dialysis and ESRFRenal diseaseK+ 3.9     Musculoskeletal  (+) Arthritis ,   Abdominal   Peds  Hematology  (+) Blood dyscrasia, anemia ,   Anesthesia Other Findings Day of surgery medications reviewed with the patient.  Reproductive/Obstetrics                                                            Anesthesia Evaluation  Patient identified by MRN, date of birth, ID band Patient awake    Reviewed: Allergy & Precautions, NPO status , Patient's Chart, lab work & pertinent test results  History of Anesthesia Complications Negative for: history of anesthetic complications  Airway Mallampati: I  TM Distance: >3 FB Neck ROM: Full    Dental  (+) Teeth Intact   Pulmonary shortness of breath,    breath sounds clear to auscultation       Cardiovascular hypertension, Pt. on medications and Pt. on home beta blockers +CHF   Rhythm:Regular  pericardial effusion needing drainage   Neuro/Psych negative neurological ROS  negative psych ROS   GI/Hepatic Neg liver  ROS, GERD  Medicated and Controlled,  Endo/Other  diabetes, Type 2  Renal/GU ESRF and DialysisRenal disease     Musculoskeletal  (+) Arthritis ,   Abdominal   Peds  Hematology   Anesthesia Other Findings   Reproductive/Obstetrics                             Anesthesia Physical Anesthesia Plan  ASA: III  Anesthesia Plan: MAC   Post-op Pain Management:    Induction:   Airway Management Planned: Nasal Cannula, Natural Airway and Simple Face Mask  Additional Equipment: None  Intra-op Plan:   Post-operative Plan:   Informed Consent: I have reviewed the patients History and Physical, chart, labs and discussed the procedure including the risks, benefits and alternatives for the proposed anesthesia with the patient or authorized representative who has indicated his/her understanding and acceptance.   Dental advisory given  Plan Discussed with: CRNA and Surgeon  Anesthesia Plan Comments:         Anesthesia Quick Evaluation  Anesthesia Physical Anesthesia Plan  ASA: IV  Anesthesia Plan: General   Post-op Pain Management:    Induction: Intravenous  PONV Risk Score and Plan: 2 and Ondansetron and Dexamethasone  Airway Management Planned: LMA  Additional Equipment:   Intra-op Plan:   Post-operative Plan: Extubation in OR  Informed Consent:   Plan  Discussed with: CRNA  Anesthesia Plan Comments: (Etomidate for induction.)       Anesthesia Quick Evaluation

## 2017-04-21 NOTE — Progress Notes (Addendum)
Anesthesia Chart Review: SAME DAY WORK-UP.   Patient is a 51 year old male scheduled for second stage left basilic vein transposition on 04/24/17 by Dr. Bridgett Larsson. He is s/p first stage BVT and insertion of right IJ tunneled dialysis catheter on 01/03/17 under MAC.   History includes ESRD (initiated 01/03/17, TTS at discharge, but he reported only a 2X/week schedule), never smoker, CHF (diagnosed > 10 years ago by Dr. Bishop Limbo in [facility]; by notes had not had prior cath), pericardial effusion s/p pericardiocentesis (1000 cc) 01/03/17, DM2, GERD, RLE DVT, appendectomy. Non-smoker, but chewing tobacco history. History does not list current ETOH use. - He was hospitalized 12/29/16-01/07/17 for acute on chronic kidney disease and acute on chronic combined CHF. Echo showed a large pericardial effusion which was followed with serial echocardiograms, but ultimately required pericardiocentesis 01/03/17 due to worsening tamponade-like physiology. Etiology unclear (viral pericarditis versus uremic pericardial effusion). 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 He underwent HD catheter insertion and first stage BVT on 01/03/17.   Meds include amlodipine, aspirin 81 mg, Coreg, Lasix, glipizide, hydralazine, Imdur, Prilosec, Percocet, Flomax, Trulicity, Ambien.  - PCP is [name]. - Nephrologist is Dr. Posey Pronto at [facility]. - Cardiologist is Dr. Kirk Ruths, last visit 03/22/17. He saw the patient for hospital follow-up and to get re-established as he had not seen Mr. Baggerly since 2016. At that time stress test showed EF 32% with diaphragmatic attenuation but no ischemia. Appearance was felt to be more consistent with non-ischemic CM, but inferior scar could not be ruled out. By most recent note Dr. Stanford Breed patient was doing better since hospital discharge; however, he wrote, "Cardiomyopathy-etiology unclear. Previous  nuclear study showed no ischemia. No history of alcohol abuse. Ordinarily I would like to proceed with cardiac catheterization to exclude coronary disease. However apparently it is not clear that he will require dialysis long-term. I will therefore avoid contrast for now. If in the future it is clear he will need long-term dialysis with plans to proceed with catheterization at that time. Continue beta blocker. No ACE inhibitor or ARB given renal insufficiency. Continue hydralazine. Add isosorbide mononitrate 30 mg daily. Titrate medicines as tolerated. I doubt he would be a good candidate for ICD given renal insufficiency."  EKG 12/30/16: NSR, incomplete LBBB, ST/T wave abnormality, consider inferior ischemia. ST/T wave abnormality, consider anterolateral ischemia. Prolonged QT (QT 412/QTc 469 ms). LVH with strain pattern. No significant change since last tracing. Overall appears stable when compared to 06/25/14 tracing.   Echo 04/12/17: Study Conclusions - Left ventricle: The cavity size was normal. There was mild   concentric hypertrophy. Systolic function was severely reduced.   The estimated ejection fraction was in the range of 20% to 25%.   Diffuse hypokinesis. Doppler parameters are consistent with   restrictive physiology, indicative of decreased left ventricular   diastolic compliance and/or increased left atrial pressure.   Doppler parameters are consistent with elevated ventricular   end-diastolic filling pressure. - Ventricular septum: Septal motion showed paradox. - Left atrium: The atrium was moderately dilated. - Right ventricle: The cavity size was mildly dilated. Wall   thickness was normal. Systolic function was severely reduced. - Tricuspid valve: There was moderate regurgitation. - Pulmonary arteries: Systolic pressure was moderately increased.   PA peak pressure: 55 mm Hg (S). - Inferior vena cava: The vessel was dilated. The respirophasic   diameter changes were blunted (<  50%), consistent with elevated  central venous pressure. - Pericardium, extracardiac: A trivial pericardial effusion was   identified posterior to the heart. There was a left pleural   effusion. Impressions: - Severe LV systolic dysfunction LVEF 20-25%. Restrictive pattern   of diastolic dysfunction.   Severely decreased RVEF.   Moderate pulmonary hypertension. (Echo report reviewed by Dr. Stanford Breed who wrote, "Pericardial effusion not reaccumulating; EF remains severely reduced. Comparison EF 35-40% 10/15/14, 15-20% 12/31/16, 35% 01/02/17, 25-30% 01/04/17, 25-30% 01/06/17.)  Nuclear stress test 10/08/14: Overall Impression:  Intermediate risk stress nuclear study with moderate to severe reduction in overall LV systolic function. There is a moderate size fixed inferior wall perfusion abnormality. Inferior wall motion hypokinesis is proportionate to remainder of left ventricle. 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  (Cardiac cath note from 01/03/17 was not a coronary angiography--it was a procedure note from pericardiocentesis.)  1V CXR 01/03/17: FINDINGS: Right IJ approach double lumen central venous catheter is in place. Tip of the catheter projects just within the right atrium. There is no pneumothorax. Cardiomegaly is seen. No pulmonary edema. No pleural effusion. IMPRESSION: Dialysis catheter tip projects just within the right atrium. Negative for pneumothorax. Cardiomegaly without edema.  He will need labs prior to surgery. A1c 03/01/17 was 5.5.   Reviewed above with anesthesiologist Dr. Orene Desanctis. Patient with known cardiomyopathy for > 10 years. EF last month remained low at 20-25%. Dr. Stanford Breed is considering cardiac cath if patient determined to need long term hemodialysis. I have outlined this above (in bold) and his note is available for review in Epic. This will need to be taken into account  when determining anesthesia plan (MAC versus GA). Will also need to evaluate for any acute CV/CHF symptoms prior to surgery.   George Hugh [facility] Short Stay Center/Anesthesiology Phone [phone] 04/21/2017 4:40 PM

## 2017-04-24 ENCOUNTER — Encounter (HOSPITAL_COMMUNITY): Payer: Self-pay | Admitting: *Deleted

## 2017-04-24 ENCOUNTER — Telehealth: Payer: Self-pay | Admitting: Vascular Surgery

## 2017-04-24 ENCOUNTER — Ambulatory Visit (HOSPITAL_COMMUNITY): Payer: BLUE CROSS/BLUE SHIELD | Admitting: Vascular Surgery

## 2017-04-24 ENCOUNTER — Ambulatory Visit (HOSPITAL_COMMUNITY)
Admission: RE | Admit: 2017-04-24 | Discharge: 2017-04-24 | Disposition: A | Discharge status: Home / Self Care | Payer: BLUE CROSS/BLUE SHIELD | Source: Ambulatory Visit | Attending: Vascular Surgery | Admitting: Vascular Surgery

## 2017-04-24 ENCOUNTER — Encounter (HOSPITAL_COMMUNITY)
Admission: RE | Disposition: A | Discharge status: Home / Self Care | Payer: Self-pay | Source: Ambulatory Visit | Attending: Vascular Surgery

## 2017-04-24 DIAGNOSIS — I272 Pulmonary hypertension, unspecified: Secondary | ICD-10-CM | POA: Insufficient documentation

## 2017-04-24 DIAGNOSIS — Z720 Tobacco use: Secondary | ICD-10-CM | POA: Diagnosis not present

## 2017-04-24 DIAGNOSIS — Z7984 Long term (current) use of oral hypoglycemic drugs: Secondary | ICD-10-CM | POA: Insufficient documentation

## 2017-04-24 DIAGNOSIS — I509 Heart failure, unspecified: Secondary | ICD-10-CM | POA: Insufficient documentation

## 2017-04-24 DIAGNOSIS — E1122 Type 2 diabetes mellitus with diabetic chronic kidney disease: Secondary | ICD-10-CM | POA: Diagnosis not present

## 2017-04-24 DIAGNOSIS — Z7982 Long term (current) use of aspirin: Secondary | ICD-10-CM | POA: Insufficient documentation

## 2017-04-24 DIAGNOSIS — K219 Gastro-esophageal reflux disease without esophagitis: Secondary | ICD-10-CM | POA: Diagnosis not present

## 2017-04-24 DIAGNOSIS — N186 End stage renal disease: Secondary | ICD-10-CM

## 2017-04-24 DIAGNOSIS — Z86718 Personal history of other venous thrombosis and embolism: Secondary | ICD-10-CM | POA: Insufficient documentation

## 2017-04-24 DIAGNOSIS — Z992 Dependence on renal dialysis: Secondary | ICD-10-CM

## 2017-04-24 HISTORY — DX: Dyspnea, unspecified: R06.00

## 2017-04-24 HISTORY — PX: BASCILIC VEIN TRANSPOSITION: SHX5742

## 2017-04-24 HISTORY — DX: Anemia, unspecified: D64.9

## 2017-04-24 HISTORY — DX: End stage renal disease: N18.6

## 2017-04-24 HISTORY — DX: Pneumonia, unspecified organism: J18.9

## 2017-04-24 HISTORY — DX: Personal history of other medical treatment: Z92.89

## 2017-04-24 LAB — GLUCOSE, CAPILLARY
Glucose-Capillary: 141 mg/dL — ABNORMAL HIGH (ref 65–99)
Glucose-Capillary: 99 mg/dL (ref 65–99)

## 2017-04-24 LAB — POCT I-STAT 4, (NA,K, GLUC, HGB,HCT)
Glucose, Bld: 144 mg/dL — ABNORMAL HIGH (ref 65–99)
HCT: 33 % — ABNORMAL LOW (ref 39.0–52.0)
Hemoglobin: 11.2 g/dL — ABNORMAL LOW (ref 13.0–17.0)
Potassium: 3.9 mmol/L (ref 3.5–5.1)
Sodium: 139 mmol/L (ref 135–145)

## 2017-04-24 LAB — HEMOGLOBIN A1C
Hgb A1c MFr Bld: 6.4 % — ABNORMAL HIGH (ref 4.8–5.6)
Mean Plasma Glucose: 136.98 mg/dL

## 2017-04-24 SURGERY — TRANSPOSITION, VEIN, BASILIC
Anesthesia: General | Site: Arm Upper | Laterality: Left

## 2017-04-24 MED ORDER — 0.9 % SODIUM CHLORIDE (POUR BTL) OPTIME
TOPICAL | Status: DC | PRN
  Administered 2017-04-24: 1000 mL

## 2017-04-24 MED ORDER — DOCUSATE SODIUM 100 MG PO CAPS: 100.0000 mg | ORAL_CAPSULE | Freq: Every day | ORAL | Status: DC

## 2017-04-24 MED ORDER — PHENYLEPHRINE HCL 10 MG/ML IJ SOLN
INTRAMUSCULAR | Status: DC | PRN
  Administered 2017-04-24: 120 ug via INTRAVENOUS

## 2017-04-24 MED ORDER — SODIUM CHLORIDE 0.9 % IV SOLN: INTRAVENOUS | Status: DC

## 2017-04-24 MED ORDER — LIDOCAINE HCL (PF) 1 % IJ SOLN
INTRAMUSCULAR | Status: DC | PRN
  Administered 2017-04-24: 26 mL

## 2017-04-24 MED ORDER — DEXAMETHASONE SODIUM PHOSPHATE 10 MG/ML IJ SOLN
INTRAMUSCULAR | Status: AC
  Filled 2017-04-24: qty 1

## 2017-04-24 MED ORDER — LIDOCAINE 2% (20 MG/ML) 5 ML SYRINGE
INTRAMUSCULAR | Status: AC
  Filled 2017-04-24: qty 5

## 2017-04-24 MED ORDER — MIDAZOLAM HCL 2 MG/2ML IJ SOLN
INTRAMUSCULAR | Status: DC | PRN
  Administered 2017-04-24: 2 mg via INTRAVENOUS

## 2017-04-24 MED ORDER — FENTANYL CITRATE (PF) 100 MCG/2ML IJ SOLN
INTRAMUSCULAR | Status: DC
Start: 2017-04-24 — End: 2017-04-24
  Filled 2017-04-24: qty 2

## 2017-04-24 MED ORDER — SODIUM CHLORIDE 0.9 % IV SOLN
INTRAVENOUS | Status: DC | PRN
  Administered 2017-04-24: 07:00:00 via INTRAVENOUS

## 2017-04-24 MED ORDER — DEXAMETHASONE SODIUM PHOSPHATE 10 MG/ML IJ SOLN
INTRAMUSCULAR | Status: DC | PRN
  Administered 2017-04-24: 5 mg via INTRAVENOUS

## 2017-04-24 MED ORDER — MIDAZOLAM HCL 2 MG/2ML IJ SOLN
INTRAMUSCULAR | Status: AC
  Filled 2017-04-24: qty 2

## 2017-04-24 MED ORDER — HEPARIN SODIUM (PORCINE) 5000 UNIT/ML IJ SOLN
INTRAMUSCULAR | Status: DC | PRN
  Administered 2017-04-24: 08:00:00

## 2017-04-24 MED ORDER — ETOMIDATE 2 MG/ML IV SOLN
INTRAVENOUS | Status: AC
  Filled 2017-04-24: qty 10

## 2017-04-24 MED ORDER — DEXTROSE 5 % IV SOLN
1.5000 g | INTRAVENOUS | Status: AC
  Administered 2017-04-24: 1.5 g via INTRAVENOUS

## 2017-04-24 MED ORDER — DEXTROSE 5 % IV SOLN
INTRAVENOUS | Status: AC
  Filled 2017-04-24: qty 1.5

## 2017-04-24 MED ORDER — PHENYLEPHRINE 40 MCG/ML (10ML) SYRINGE FOR IV PUSH (FOR BLOOD PRESSURE SUPPORT)
PREFILLED_SYRINGE | INTRAVENOUS | Status: AC
  Filled 2017-04-24: qty 10

## 2017-04-24 MED ORDER — ONDANSETRON HCL 4 MG/2ML IJ SOLN
INTRAMUSCULAR | Status: AC
  Filled 2017-04-24: qty 2

## 2017-04-24 MED ORDER — ETOMIDATE 2 MG/ML IV SOLN
INTRAVENOUS | Status: DC | PRN
  Administered 2017-04-24: 18 mg via INTRAVENOUS

## 2017-04-24 MED ORDER — HEMOSTATIC AGENTS (NO CHARGE) OPTIME
TOPICAL | Status: DC | PRN
  Administered 2017-04-24 (×2): 1 via TOPICAL

## 2017-04-24 MED ORDER — ONDANSETRON HCL 4 MG/2ML IJ SOLN
INTRAMUSCULAR | Status: DC | PRN
  Administered 2017-04-24: 4 mg via INTRAVENOUS

## 2017-04-24 MED ORDER — LIDOCAINE HCL (PF) 1 % IJ SOLN
INTRAMUSCULAR | Status: AC
  Filled 2017-04-24: qty 30

## 2017-04-24 MED ORDER — CHLORHEXIDINE GLUCONATE CLOTH 2 % EX PADS: 6.0000 | MEDICATED_PAD | Freq: Once | CUTANEOUS | Status: DC

## 2017-04-24 MED ORDER — LIDOCAINE HCL (CARDIAC) 20 MG/ML IV SOLN
INTRAVENOUS | Status: DC | PRN
  Administered 2017-04-24: 100 mg via INTRAVENOUS

## 2017-04-24 MED ORDER — ONDANSETRON HCL 4 MG/2ML IJ SOLN: 4.0000 mg | Freq: Once | INTRAMUSCULAR | Status: DC | PRN

## 2017-04-24 MED ORDER — OXYCODONE-ACETAMINOPHEN 10-325 MG PO TABS: 1.0000 | ORAL_TABLET | Freq: Four times a day (QID) | ORAL | 0 refills | Status: DC | PRN

## 2017-04-24 MED ORDER — FENTANYL CITRATE (PF) 250 MCG/5ML IJ SOLN
INTRAMUSCULAR | Status: AC
  Filled 2017-04-24: qty 5

## 2017-04-24 MED ORDER — POLYETHYLENE GLYCOL 3350 17 GM/SCOOP PO POWD: 17.0000 g | Freq: Every day | ORAL | Status: DC | PRN

## 2017-04-24 MED ORDER — FENTANYL CITRATE (PF) 100 MCG/2ML IJ SOLN
INTRAMUSCULAR | Status: DC | PRN
  Administered 2017-04-24 (×2): 50 ug via INTRAVENOUS
  Administered 2017-04-24 (×2): 25 ug via INTRAVENOUS

## 2017-04-24 MED ORDER — FENTANYL CITRATE (PF) 100 MCG/2ML IJ SOLN
25.0000 ug | INTRAMUSCULAR | Status: AC | PRN
  Administered 2017-04-24 (×6): 25 ug via INTRAVENOUS

## 2017-04-24 MED ORDER — FENTANYL CITRATE (PF) 100 MCG/2ML IJ SOLN
INTRAMUSCULAR | Status: AC
  Filled 2017-04-24: qty 2

## 2017-04-24 SURGICAL SUPPLY — 46 items
ARMBAND PINK RESTRICT EXTREMIT (MISCELLANEOUS) ×3 IMPLANT
CANISTER SUCT 3000ML PPV (MISCELLANEOUS) ×3 IMPLANT
CLIP VESOCCLUDE MED 24/CT (CLIP) ×3 IMPLANT
CLIP VESOCCLUDE SM WIDE 24/CT (CLIP) ×3 IMPLANT
CORDS BIPOLAR (ELECTRODE) IMPLANT
COVER PROBE W GEL 5X96 (DRAPES) IMPLANT
DECANTER SPIKE VIAL GLASS SM (MISCELLANEOUS) ×3 IMPLANT
DERMABOND ADVANCED (GAUZE/BANDAGES/DRESSINGS) ×4
DERMABOND ADVANCED .7 DNX12 (GAUZE/BANDAGES/DRESSINGS) ×2 IMPLANT
ELECT REM PT RETURN 9FT ADLT (ELECTROSURGICAL) ×3
ELECTRODE REM PT RTRN 9FT ADLT (ELECTROSURGICAL) ×1 IMPLANT
GAUZE SPONGE 4X4 16PLY XRAY LF (GAUZE/BANDAGES/DRESSINGS) ×3 IMPLANT
GLOVE BIO SURGEON STRL SZ7 (GLOVE) ×3 IMPLANT
GLOVE BIOGEL M 6.5 STRL (GLOVE) ×3 IMPLANT
GLOVE BIOGEL PI IND STRL 6 (GLOVE) ×1 IMPLANT
GLOVE BIOGEL PI IND STRL 6.5 (GLOVE) ×1 IMPLANT
GLOVE BIOGEL PI IND STRL 7.0 (GLOVE) ×1 IMPLANT
GLOVE BIOGEL PI IND STRL 7.5 (GLOVE) ×1 IMPLANT
GLOVE BIOGEL PI INDICATOR 6 (GLOVE) ×2
GLOVE BIOGEL PI INDICATOR 6.5 (GLOVE) ×2
GLOVE BIOGEL PI INDICATOR 7.0 (GLOVE) ×2
GLOVE BIOGEL PI INDICATOR 7.5 (GLOVE) ×2
GLOVE SURG SS PI 6.5 STRL IVOR (GLOVE) ×3 IMPLANT
GOWN STRL NON-REIN LRG LVL3 (GOWN DISPOSABLE) ×3 IMPLANT
GOWN STRL REUS W/ TWL LRG LVL3 (GOWN DISPOSABLE) ×3 IMPLANT
GOWN STRL REUS W/TWL LRG LVL3 (GOWN DISPOSABLE) ×6
HEMOSTAT SPONGE AVITENE ULTRA (HEMOSTASIS) ×6 IMPLANT
KIT BASIN OR (CUSTOM PROCEDURE TRAY) ×3 IMPLANT
KIT ROOM TURNOVER OR (KITS) ×3 IMPLANT
NEEDLE HYPO 25GX1X1/2 BEV (NEEDLE) ×3 IMPLANT
NS IRRIG 1000ML POUR BTL (IV SOLUTION) ×3 IMPLANT
PACK CV ACCESS (CUSTOM PROCEDURE TRAY) ×3 IMPLANT
PAD ARMBOARD 7.5X6 YLW CONV (MISCELLANEOUS) ×6 IMPLANT
SPONGE LAP 18X18 X RAY DECT (DISPOSABLE) ×3 IMPLANT
SUT MNCRL AB 4-0 PS2 18 (SUTURE) ×6 IMPLANT
SUT PROLENE 6 0 BV (SUTURE) ×6 IMPLANT
SUT PROLENE 7 0 BV 1 (SUTURE) ×15 IMPLANT
SUT SILK 2 0 SH (SUTURE) ×3 IMPLANT
SUT SILK 4 0 (SUTURE) ×2
SUT SILK 4-0 18XBRD TIE 12 (SUTURE) ×1 IMPLANT
SUT VIC AB 2-0 CT1 27 (SUTURE) ×2
SUT VIC AB 2-0 CT1 TAPERPNT 27 (SUTURE) ×1 IMPLANT
SUT VIC AB 3-0 SH 27 (SUTURE) ×4
SUT VIC AB 3-0 SH 27X BRD (SUTURE) ×2 IMPLANT
UNDERPAD 30X30 (UNDERPADS AND DIAPERS) ×3 IMPLANT
WATER STERILE IRR 1000ML POUR (IV SOLUTION) ×3 IMPLANT

## 2017-04-24 NOTE — Transfer of Care (Signed)
Immediate Anesthesia Transfer of Care Note  Patient: James Burke  Procedure(s) Performed: Procedure(s): LEFT SECOND STAGE BRACHIAL VEIN TRANSPOSITION (Left)  Patient Location: PACU  Anesthesia Type:General  Level of Consciousness: drowsy and patient cooperative  Airway & Oxygen Therapy: Patient Spontanous Breathing and Patient connected to nasal cannula oxygen  Post-op Assessment: Report given to RN  Post vital signs: Reviewed and stable  Last Vitals:  Vitals:   04/24/17 0616  BP: (!) 156/92  Pulse: 81  Resp: 18  Temp: (!) 36.4 C  SpO2: 100%    Last Pain:  Vitals:   04/24/17 0639  TempSrc:   PainSc: 5       Patients Stated Pain Goal: 1 (06/03/56 4734)  Complications: No apparent anesthesia complications

## 2017-04-24 NOTE — Telephone Encounter (Signed)
-----   Message from Ovidio Hanger sent at 04/24/2017  1:57 PM EDT ----- Regarding: BLC 2-3 week appt.   ----- Message ----- From: Ansel Bong Sent: 04/24/2017   8:35 AM To: Vvs Charge Pool  S/p left 2nd stage brachial vein transposition 04/24/17  F/u with Dr. Bridgett Larsson in 2-3 weeks. No duplex  Thanks Maudie Mercury

## 2017-04-24 NOTE — Anesthesia Procedure Notes (Signed)
Procedure Name: LMA Insertion Date/Time: 04/24/2017 7:37 AM Performed by: Sampson Si E Pre-anesthesia Checklist: Patient identified, Emergency Drugs available, Suction available and Patient being monitored Patient Re-evaluated:Patient Re-evaluated prior to induction Oxygen Delivery Method: Circle System Utilized Preoxygenation: Pre-oxygenation with 100% oxygen Induction Type: IV induction Ventilation: Mask ventilation without difficulty LMA: LMA inserted LMA Size: 5.0 Number of attempts: 1 Airway Equipment and Method: Bite block Placement Confirmation: positive ETCO2 Tube secured with: Tape Dental Injury: Teeth and Oropharynx as per pre-operative assessment

## 2017-04-24 NOTE — Anesthesia Postprocedure Evaluation (Signed)
Anesthesia Post Note  Patient: James Burke  Procedure(s) Performed: Procedure(s) (LRB): LEFT SECOND STAGE BRACHIAL VEIN TRANSPOSITION (Left)     Patient location during evaluation: PACU Anesthesia Type: General Level of consciousness: awake and alert Pain management: pain level controlled Vital Signs Assessment: post-procedure vital signs reviewed and stable Respiratory status: spontaneous breathing, nonlabored ventilation and respiratory function stable Cardiovascular status: blood pressure returned to baseline and stable Postop Assessment: no signs of nausea or vomiting Anesthetic complications: no    Last Vitals:  Vitals:   04/24/17 1228 04/24/17 1230  BP:    Pulse:  85  Resp:  19  Temp: 37.1 C   SpO2:  96%    Last Pain:  Vitals:   04/24/17 1200  TempSrc:   PainSc: [place]

## 2017-04-24 NOTE — H&P (View-Only) (Signed)
Postoperative Access Visit   History of Present Illness   James Burke is a 51 y.o. (1966-02-21) male who presents for postoperative follow-up for: left first stage brachial vein transposition (Date: 01/03/17).  The patient's wounds are healed.  The patient notes no steal symptoms.  The patient is able to complete their activities of daily living.  The patient's current symptoms are: none.  Pt's renal function is recovering now on HD 2 day/week rather than 3 days/week.  Long term prognosis is that he will require HD.  Past Medical History:  Diagnosis Date  . 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 C. difficile colitis 04/18/2008  . Congestive heart failure ([place])   . Diabetes mellitus   . DVT (deep venous thrombosis) ([facility])    right leg   . GERD (gastroesophageal reflux disease)   . Renal insufficiency     Past Surgical History:  Procedure Laterality Date  . APPENDECTOMY  1995  . BASCILIC VEIN TRANSPOSITION Left 01/03/2017   Procedure: FIRST STAGE BRACHIAL VEIN TRANSPOSITION;  Surgeon: Conrad Fronton [place], MD;  Location: [facility];  Service: Vascular;  Laterality: Left;  . INSERTION OF DIALYSIS CATHETER Right 01/03/2017   Procedure: INSERTION OF DIALYSIS CATHETER RIGHT INTERNAL JUGULAR;  Surgeon: Conrad Lovingston, MD;  Location: [facility];  Service: Vascular;  Laterality: Right;  . KNEE SURGERY    . PERICARDIOCENTESIS N/A 01/03/2017   Procedure: Pericardiocentesis;  Surgeon: Burnell Blanks, MD;  Location: [facility];  Service: Cardiovascular;  Laterality: N/A;    Social History   Social History  . Marital status: Married    Spouse name: N/A  . Number of children: 3  . Years of education: N/A   Occupational History  . Not on file.   Social History Main Topics  . Smoking status: Never Smoker  . Smokeless tobacco: Current User    Types: Chew  . Alcohol use No  . Drug use: No  . Sexual activity: Yes   Other Topics Concern  . Not on file   Social History Narrative  . No  narrative on file    Family History  Problem Relation Age of Onset  . Heart disease Unknown        No family history  . Cancer Brother     Current Outpatient Prescriptions  Medication Sig Dispense Refill  . AMBULATORY NON FORMULARY MEDICATION Medication Name: glucometer, strips and lancet Dx 250.02 Test twice a day. 100 Units pRN  . amLODipine (NORVASC) 10 MG tablet Take 1 tablet (10 mg total) by mouth daily. 90 tablet 1  . aspirin 81 MG tablet Take 81 mg by mouth daily.    . carvedilol (COREG) 25 MG tablet TAKE 1 TABLET (25 MG TOTAL) BY MOUTH 2 (TWO) TIMES DAILY WITH A MEAL. 180 tablet 1  . docusate sodium (COLACE) 100 MG capsule Take 1 capsule (100 mg total) by mouth 2 (two) times daily. 10 capsule 0  . furosemide (LASIX) 40 MG tablet Take 2 tablets (80 mg total) by mouth 2 (two) times daily. 40 tablet 0  . glipiZIDE (GLUCOTROL) 5 MG tablet Take 5mg  tablet by mouth in the morning and 2.5mg  in the evening    . hydrALAZINE (APRESOLINE) 25 MG tablet Take 1 tablet (25 mg total) by mouth 3 (three) times daily. 90 tablet 1  . isosorbide mononitrate (IMDUR) 30 MG 24 hr tablet Take 1 tablet (30 mg total) by mouth daily. 90 tablet 3  . omeprazole (PRILOSEC)  40 MG capsule Take 1 capsule (40 mg total) by mouth daily. 30 capsule 1  . oxyCODONE-acetaminophen (PERCOCET) 10-325 MG per tablet Take 1 tablet by mouth every 8 (eight) hours as needed for pain.     . polyethylene glycol powder (MIRALAX) powder Take 17 g by mouth daily. 850 g 0  . tamsulosin (FLOMAX) 0.4 MG CAPS capsule TAKE 2 CAPSULES BY MOUTH DAILY AFTER SUPPER 180 capsule 2  . TRULICITY 7.40 CX/4.4YJ SOPN INJECT 0.75MG  EVERY 7 DAYS  6  . zolpidem (AMBIEN) 10 MG tablet Take 1 tablet (10 mg total) by mouth at bedtime as needed. 90 tablet 0   No current facility-administered medications for this visit.      Allergies  Allergen Reactions  . Prednisone     Other reaction(s): Other blindness  . Methocarbamol Diarrhea  . Simvastatin  Other (See Comments)    Joint aches.     REVIEW OF SYSTEMS (negative unless checked):   Cardiac:  []  Chest pain or chest pressure? []  Shortness of breath upon activity? []  Shortness of breath when lying flat? []  Irregular heart rhythm?  Vascular:  []  Pain in calf, thigh, or hip brought on by walking? []  Pain in feet at night that wakes you up from your sleep? []  Blood clot in your veins? []  Leg swelling?  Pulmonary:  []  Oxygen at home? []  Productive cough? []  Wheezing?  Neurologic:  []  Sudden weakness in arms or legs? []  Sudden numbness in arms or legs? []  Sudden onset of difficult speaking or slurred speech? []  Temporary loss of vision in one eye? []  Problems with dizziness?  Gastrointestinal:  []  Blood in stool? []  Vomited blood?  Genitourinary:  []  Burning when urinating? [x]  End stage renal disease-HD: T/R/S   Psychiatric:  []  Major depression  Hematologic:  []  Bleeding problems? []  Problems with blood clotting?  Dermatologic:  []  Rashes or ulcers?  Constitutional:  []  Fever or chills?  Ear/Nose/Throat:  []  Change in hearing? []  Nose bleeds? []  Sore throat?  Musculoskeletal:  []  Back pain? []  Joint pain? []  Muscle pain?   Physical Examination   Vitals:   04/14/17 1231  BP: (!) 150/88  Pulse: 80  Resp: 16  Temp: (!) 97.1 F (36.2 C)  TempSrc: Oral  SpO2: 95%  Weight: 198 lb 1.6 oz (89.9 kg)  Height: 6' (1.829 m)   Pulmonary Sym exp, good air movt, CTAB, no rales, rhonchi, & wheezing  Cardiac RRR, Nl S1, S2, no Murmurs, rubs or gallops  Left Arm Incision is healed, skin feels warm, hand grip is 5/5, sensation in digits is intact, palpable thrill, bruit can be auscultated, on Sonosite: webbed distal brachial veins 5 mm, fistula >6 mm in diameter in proximal 2/3     Medical Decision Making   James Burke is a 51 y.o. (February 13, 1966) male who presents s/p left first stage brachial vein transposition   At this point, I think the  fistula is mature enough to pursue transposition.  Will schedule left second stage brachial vein transposition on 20 AUG 18.  Thank you for allowing [place] to participate in this patient's care.   Adele Barthel, MD, FACS [facility] [place] Office: [phone] Pager: [phone]

## 2017-04-24 NOTE — Discharge Instructions (Signed)
° °  [facility]  Discharge Instructions  AV Fistula or Graft Surgery for Dialysis Access  Please refer to the following instructions for your post-procedure care. Your surgeon or physician assistant will discuss any changes with you.  Activity  You may drive the day following your surgery, if you are comfortable and no longer taking prescription pain medication. Resume full activity as the soreness in your incision resolves.  Bathing/Showering  You may shower after you go home. Keep your incision dry for 48 hours. Do not soak in a bathtub, hot tub, or swim until the incision heals completely. You may not shower if you have a hemodialysis catheter.  Incision Care  Clean your incision with mild soap and water after 48 hours. Pat the area dry with a clean towel. You do not need a bandage unless otherwise instructed. Do not apply any ointments or creams to your incision. You may have skin glue on your incision. Do not peel it off. It will come off on its own in about one week. Your arm may swell a bit after surgery. To reduce swelling use pillows to elevate your arm so it is above your heart. Your doctor will tell you if you need to lightly wrap your arm with an ACE bandage.  Diet  Resume your normal diet. There are not special food restrictions following this procedure. In order to heal from your surgery, it is CRITICAL to get adequate nutrition. Your body requires vitamins, minerals, and protein. Vegetables are the best source of vitamins and minerals. Vegetables also provide the perfect balance of protein. Processed food has little nutritional value, so try to avoid this.  Medications  Resume taking all of your medications. If your incision is causing pain, you may take over-the counter pain relievers such as acetaminophen (Tylenol). If you were prescribed a stronger pain medication, please be aware these medications can cause nausea and constipation. Prevent  nausea by taking the medication with a snack or meal. Avoid constipation by drinking plenty of fluids and eating foods with high amount of fiber, such as fruits, vegetables, and grains. Do not take Tylenol if you are taking prescription pain medications.  Follow up Your surgeon may want to see you in the office following your access surgery. If so, this will be arranged at the time of your surgery.  Please call us immediately for any of the following conditions:  Increased pain, redness, drainage (pus) from your incision site Fever of 101 degrees or higher Severe or worsening pain at your incision site Hand pain or numbness.  Reduce your risk of vascular disease:  Stop smoking. If you would like help, call [facility] at 1-800-QUIT-NOW (551)512-6904) or [place] at [place] your cholesterol Maintain a desired weight Control your diabetes Keep your blood pressure down  Dialysis  It will take several weeks to several months for your new dialysis access to be ready for use. Your surgeon will determine when it is OK to use it. Your nephrologist will continue to direct your dialysis. You can continue to use your Permcath until your new access is ready for use.   04/24/2017 James Burke [id number] 08-30-1966  Surgeon(s): Conrad Sutherland, MD  Procedure(s): LEFT SECOND STAGE BRACHIAL VEIN TRANSPOSITION  x Do not stick graft for 4 weeks    If you have any questions, please call the office at [phone].

## 2017-04-24 NOTE — Op Note (Signed)
OPERATIVE NOTE   PROCEDURE: left second stage brachial vein transposition (brachiobrachial arteriovenous fistula) placement with revision of anastomosis  PRE-OPERATIVE DIAGNOSIS: end stage renal disease requiring hemodialysis, s/p successful first stage brachial vein transposition   POST-OPERATIVE DIAGNOSIS: same as above   SURGEON: Adele Barthel, MD  ASSISTANT(S): RNFA  ANESTHESIA: general  ESTIMATED BLOOD LOSS: 30 cc  FINDING(S): 1.  Fistula: 5-6 in proximal 2/3, distal fistula sclerotic and not salvage, strong thrill after revision with more proximal segment of vein 2.  Easily palpable thrill in fistula at end of case with palpable radial pulse  SPECIMEN(S):  none  INDICATIONS:   James Burke is a 51 y.o. male who presents with end stage renal disease requiring hemodialysis.  I had placed a left first stage brachial vein transposition previously.  The patient's kidney function had partially recovered so we held off on transposing the fistula.  Unfortunately his recovery has not been completed, so he has elected to proceed with the transposition.  The patient is scheduled for left second stage brachial vein transposition.  The patient is aware the risks include but are not limited to: bleeding, infection, steal syndrome, nerve damage, ischemic monomelic neuropathy, failure to mature, and need for additional procedures.  The patient is aware of the risks of the procedure and elects to proceed forward.   DESCRIPTION: After full informed written consent was obtained from the patient, the patient was brought back to the operating room and placed supine upon the operating table.  Prior to induction, the patient received IV antibiotics.   After obtaining adequate anesthesia, the patient was then prepped and draped in the standard fashion for a left arm access procedure.  I turned my attention first to identifying the patient's brachiobrachial arteriovenous fistula.  Using SonoSite  guidance, the location of this fistula was marked out on the skin.  I made an longitudinal incision over the fistula from its arterial anastomosis up to its axillary extent.  I carefully dissected the fistula away from its adjacent nerves, ligating side branches in the process.  Initially it appeared as if the basilic vein might be useable but after fully exposing the brachial and basilic vein, I found the basilic vein to be inadequate.  Eventually the entirety of this fistula was mobilized.  The distal fistula appeared to be diseased externally, appearing sclerotic and small (2-2.5 mm). I elected to redo the anastomosis.  I ligated the distal fistula adjacent to the anastomosis and transected the fistula.  The distal fistula demonstrated neointimal hyperplasia within the lumen.  I tunneled with a curvilinear tunneler and tied the anteriorly marked fistula to the inner cannula.  I delivered the fistula through the tunnel while maintaining orientation.  I dissected out a segment of the brachial artery adjacent to the newly tunneled brachial vein.  I placed the artery under tension proximally and distally with vessel loops and made an arteriotomy.  I spatulated the vein, shortening another 1 cm in the process, eliminating the diseased segment.  I sewed the brachial vein to the brachial artery with a running stitch of 7-0 Prolene.  Prior to completion, I backbled the vein and artery.  There was no clot present.  I released all vessel loops.  Immediately, I had some concerns with the orientation of the vein on the artery.  I elected to completely open the subcutaneous tunnel with electrocautery.  It became evident there with a 90 degree twist that was compromising the fistula.  I clamped the  fistula distally and proximally.  I transected the vein in the proximal 1/3 and resewed the fistula vein to vein with a circumferential running stitch of 6-0 Prolene, taking out the twist in the process.  There was minimal change  in diameter and no bleeding at this revision site.  The fistula had an excellent thrill at this point.  The fistula was secured in this new location with interrupted 3-0 Vicryl stitches tied from side branches to the underlying fascia.  The deep subcutaneous tissue was inspected for bleeding.  Bleeding was controlled with electrocautery and placement of large pieces of thrombin and gelfoam.  I washed out the surgical site after waiting a few minutes, and there was no further bleeding.  The subcutaneous tissue was reapproximated with two layers of a running stitch of 3-0 Vicryl.  The skin was then reapproximated with a running subcuticular of 4-0 Monocryl.  The skin was then cleaned, dried, and reinforced with Dermabond.  The patient tolerated this procedure well.    COMPLICATIONS: none  CONDITION: stable   Adele Barthel, MD, [facility] [place] Office: [phone] Pager: [phone]  04/24/2017, 11:06 AM

## 2017-04-24 NOTE — Interval H&P Note (Signed)
History and Physical Interval Note:  04/24/2017 7:08 AM  James Burke  has presented today for surgery, with the diagnosis of End Stage Renal Disease N18.6  The various methods of treatment have been discussed with the patient and family. After consideration of risks, benefits and other options for treatment, the patient has consented to  Procedure(s): [place] (Left) as a surgical intervention .  The patient's history has been reviewed, patient examined, no change in status, stable for surgery.  I have reviewed the patient's chart and labs.  Questions were answered to the patient's satisfaction.     Adele Barthel

## 2017-04-24 NOTE — Telephone Encounter (Signed)
Sched appt 05/26/17 at 11:30. Lm on hm#.

## 2017-04-25 ENCOUNTER — Encounter (HOSPITAL_COMMUNITY): Payer: Self-pay | Admitting: Vascular Surgery

## 2017-04-26 ENCOUNTER — Encounter: Payer: Self-pay | Admitting: Family Medicine

## 2017-05-10 ENCOUNTER — Encounter: Payer: Self-pay | Admitting: Family Medicine

## 2017-05-11 MED ORDER — ZOLPIDEM TARTRATE 10 MG PO TABS: 10.0000 mg | ORAL_TABLET | Freq: Every day | ORAL | 0 refills | Status: DC

## 2017-05-11 MED ORDER — CARVEDILOL 25 MG PO TABS: 25.0000 mg | ORAL_TABLET | Freq: Two times a day (BID) | ORAL | 1 refills | Status: DC

## 2017-05-12 ENCOUNTER — Encounter: Payer: Self-pay | Admitting: Family Medicine

## 2017-05-24 NOTE — Progress Notes (Signed)
    Postoperative Access Visit   History of Present Illness   James Burke is a 51 y.o. year old male who presents for postoperative follow-up for: left second stage brachial vein transposition (Date: 04/24/17).  The patient's wounds are healing.  The patient notes no steal symptoms.  The patient is  able to complete their activities of daily living.  The patient's current symptoms are: resolving swelling.  Reportedly lef tarm was echymotic and very swollen   Physical Examination   Vitals:   05/26/17 1100  BP: (!) 142/84  Pulse: 82  Resp: 20  Temp: 97.7 F (36.5 C)  SpO2: 96%  Weight: 198 lb (89.8 kg)  Height: 6' (1.829 m)   Body mass index is 26.85 kg/m.  left arm Incision is nearly healed, skin feels warm, hand grip is 5/5, sensation in digits is intact, palpable thrill, bruit can be auscultated, some residual hematoma evident in proximal 1/3    Medical Decision Making   AXXEL GUDE is a 51 y.o. year old male who presents s/p left second stage brachial vein transposition   F/U in 2 weeks for wound check  Towel to left arm and warm compress to L arm to help resorption of hematoma.  Thank you for allowing [place] to participate in this patient's care.   Adele Barthel, MD, FACS [facility] Office: [phone] Pager: [phone]

## 2017-05-26 ENCOUNTER — Encounter: Payer: Self-pay | Admitting: Vascular Surgery

## 2017-05-26 ENCOUNTER — Ambulatory Visit (INDEPENDENT_AMBULATORY_CARE_PROVIDER_SITE_OTHER): Payer: Self-pay | Admitting: Vascular Surgery

## 2017-05-26 VITALS — BP 142/84 | HR 82 | Temp 97.7°F | Resp 20 | Ht 72.0 in | Wt 198.0 lb

## 2017-05-26 DIAGNOSIS — N186 End stage renal disease: Secondary | ICD-10-CM

## 2017-06-06 NOTE — Progress Notes (Signed)
    Postoperative Access Visit   History of Present Illness   James Burke is a 51 y.o. year old male who presents for postoperative follow-up for: left second stage brachial vein transposition (Date: 04/24/17).  The patient's wounds are mostly healed.  The patient notes no steal symptoms.  The patient is able to complete their activities of daily living.  The patient's current symptoms are: greatly decreased L arm swelling.   Physical Examination   Vitals:   06/09/17 1542  BP: 138/83  Pulse: 76  Resp: 16  SpO2: 98%  Weight: 204 lb 9.6 oz (92.8 kg)  Height: 6' (1.829 m)   Body mass index is 27.75 kg/m.  left arm Incision is nearly healed, small area in mid-segment with small scab, small residual hematoma in upper 1/3, skin feels warm, hand grip is 5/5, sensation in digits is intact, palpable thrill, bruit can be auscultated     Medical Decision Making   JADRIAN BULMAN is a 51 y.o. year old male who presents s/p left second stage brachial vein transposition   The patient's access is nearly ready for use.  Ok to proceed in two weeks.  Thank you for allowing [place] to participate in this patient's care.   Adele Barthel, MD, FACS [facility] Office: [phone] Pager: [phone]

## 2017-06-07 ENCOUNTER — Encounter: Payer: Self-pay | Admitting: Vascular Surgery

## 2017-06-09 ENCOUNTER — Encounter: Payer: Self-pay | Admitting: Vascular Surgery

## 2017-06-09 ENCOUNTER — Ambulatory Visit (INDEPENDENT_AMBULATORY_CARE_PROVIDER_SITE_OTHER): Payer: BLUE CROSS/BLUE SHIELD | Admitting: Vascular Surgery

## 2017-06-09 VITALS — BP 138/83 | HR 76 | Resp 16 | Ht 72.0 in | Wt 204.6 lb

## 2017-06-09 DIAGNOSIS — N186 End stage renal disease: Secondary | ICD-10-CM

## 2017-06-12 ENCOUNTER — Other Ambulatory Visit: Payer: Self-pay | Admitting: Family Medicine

## 2017-06-12 NOTE — Progress Notes (Deleted)
HPI: FU congestive heart failure. Nuclear study February 2016 showed ejection fraction 32%. There is diaphragmatic attenuation but no ischemia. He was admitted in April 2018 for congestive heart failure, pericardial effusion and acute on chronic renal insufficiency. Echocardiogram showed severely reduced LV function with EF 15-20, severe LVH and LVE, grade 2 DD, mild MR, moderate to severe LAE, moderately reduced RV function, severely elevated pulmonary pressure and large pericardial effusion. He ultimately underwent pericardiocentesis. Cultures and cytology negative. Etiology of pericardial effusion of unclear and was not felt to all be secondary to uremia. Question viral pericarditis. Patient was initiated on dialysis. It was unclear whether this would be long-term or temporary. Echo repeated 9/18 and showed EF 20-25, restrictive filling, moderate LAE, mild RVE, severe RV dysfunction, moderate TR, moderate pulmonary hypertensiion, trivial PE. Since he was last seen,   Current Outpatient Prescriptions  Medication Sig Dispense Refill  . amLODipine (NORVASC) 10 MG tablet Take 1 tablet (10 mg total) by mouth daily. (Patient taking differently: Take 10 mg by mouth at bedtime. ) 90 tablet 1  . aspirin 81 MG tablet Take 81 mg by mouth at bedtime.     . carvedilol (COREG) 25 MG tablet Take 1 tablet (25 mg total) by mouth 2 (two) times daily with a meal. 180 tablet 1  . docusate sodium (COLACE) 100 MG capsule Take 1 capsule (100 mg total) by mouth at bedtime.    . furosemide (LASIX) 40 MG tablet Take 2 tablets (80 mg total) by mouth 2 (two) times daily. 40 tablet 0  . glipiZIDE (GLUCOTROL) 5 MG tablet Take 2.5-5 mg by mouth See admin instructions. Take 5mg  tablet by mouth in the morning and 2.5mg  in the evening     . hydrALAZINE (APRESOLINE) 25 MG tablet Take 1 tablet (25 mg total) by mouth 3 (three) times daily. 90 tablet 1  . isosorbide mononitrate (IMDUR) 30 MG 24 hr tablet Take 1 tablet (30 mg  total) by mouth daily. 90 tablet 3  . omeprazole (PRILOSEC) 40 MG capsule Take 1 capsule (40 mg total) by mouth daily. 90 capsule 1  . oxyCODONE-acetaminophen (PERCOCET) 10-325 MG tablet Take 1 tablet by mouth every 6 (six) hours as needed for pain. 30 tablet 0  . polyethylene glycol powder (MIRALAX) powder Take 17 g by mouth daily as needed for moderate constipation.    . tamsulosin (FLOMAX) 0.4 MG CAPS capsule TAKE 2 CAPSULES BY MOUTH DAILY AFTER SUPPER (Patient taking differently: Take 0.8 mg by mouth daily after supper. ) 180 capsule 2  . TRULICITY 5.99 JT/7.0VX SOPN INJECT 0.75MG  EVERY 7 DAYS ON SATURDAY  6  . zolpidem (AMBIEN) 10 MG tablet Take 1 tablet (10 mg total) by mouth at bedtime. 90 tablet 0   No current facility-administered medications for this visit.      Past Medical History:  Diagnosis Date  . Anemia   . Arthritis, septic, knee ([place])   . C. difficile colitis 04/18/2008  . Congestive heart failure ([place])   . Diabetes mellitus    Type II  . DVT (deep venous thrombosis) (HCC)    right leg   . Dyspnea    "when I have too much fluid.'  . ESRD (end stage renal disease) ([place])    Tues, Thurs  . GERD (gastroesophageal reflux disease)   . History of blood transfusion 2017  . Pneumonia     Past Surgical History:  Procedure Laterality Date  . APPENDECTOMY  1995  . BASCILIC  VEIN TRANSPOSITION Left 01/03/2017   Procedure: FIRST STAGE BRACHIAL VEIN TRANSPOSITION;  Surgeon: Conrad [place], MD;  Location: [facility];  Service: Vascular;  Laterality: Left;  . BASCILIC VEIN TRANSPOSITION Left 04/24/2017   Procedure: LEFT SECOND STAGE BRACHIAL VEIN TRANSPOSITION;  Surgeon: Conrad Winston, MD;  Location: [facility];  Service: Vascular;  Laterality: Left;  . EYE SURGERY    . INSERTION OF DIALYSIS CATHETER Right 01/03/2017   Procedure: INSERTION OF DIALYSIS CATHETER RIGHT INTERNAL JUGULAR;  Surgeon: Conrad Screven, MD;  Location: [facility];  Service: Vascular;  Laterality: Right;  . KNEE SURGERY Left  04/2014  . [place]  Bilateral   . PERICARDIOCENTESIS N/A 01/03/2017   Procedure: Pericardiocentesis;  Surgeon: Burnell Blanks, MD;  Location: [facility];  Service: Cardiovascular;  Laterality: N/A;    Social History   Social History  . Marital status: Married    Spouse name: N/A  . Number of children: 3  . Years of education: N/A   Occupational History  . Not on file.   Social History Main Topics  . Smoking status: Never Smoker  . Smokeless tobacco: Former Systems developer    Types: Chew     Comment: no chewing 01/2017  . Alcohol use No  . Drug use: No  . Sexual activity: Yes   Other Topics Concern  . Not on file   Social History Narrative  . No narrative on file    Family History  Problem Relation Age of Onset  . Heart disease Unknown        No family history  . Cancer Brother     ROS: no fevers or chills, productive cough, hemoptysis, dysphasia, odynophagia, melena, hematochezia, dysuria, hematuria, rash, seizure activity, orthopnea, PND, pedal edema, claudication. Remaining systems are negative.  Physical Exam: Well-developed well-nourished in no acute distress.  Skin is warm and dry.  HEENT is normal.  Neck is supple.  Chest is clear to auscultation with normal expansion.  Cardiovascular exam is regular rate and rhythm.  Abdominal exam nontender or distended. No masses palpated. Extremities show no edema. neuro grossly intact  ECG- personally reviewed  A/P  1  James Ruths, MD

## 2017-06-13 ENCOUNTER — Encounter: Payer: Self-pay | Admitting: Family Medicine

## 2017-06-13 MED ORDER — AMLODIPINE BESYLATE 10 MG PO TABS: 10.0000 mg | ORAL_TABLET | Freq: Every day | ORAL | 0 refills | Status: DC

## 2017-06-15 ENCOUNTER — Encounter: Payer: Self-pay | Admitting: Family Medicine

## 2017-06-15 ENCOUNTER — Ambulatory Visit (INDEPENDENT_AMBULATORY_CARE_PROVIDER_SITE_OTHER): Payer: BLUE CROSS/BLUE SHIELD | Admitting: Family Medicine

## 2017-06-15 VITALS — BP 131/84 | HR 73 | Ht 72.0 in | Wt 202.0 lb

## 2017-06-15 DIAGNOSIS — E11610 Type 2 diabetes mellitus with diabetic neuropathic arthropathy: Secondary | ICD-10-CM | POA: Diagnosis not present

## 2017-06-15 DIAGNOSIS — Z23 Encounter for immunization: Secondary | ICD-10-CM | POA: Diagnosis not present

## 2017-06-15 DIAGNOSIS — G8929 Other chronic pain: Secondary | ICD-10-CM | POA: Diagnosis not present

## 2017-06-15 DIAGNOSIS — E1122 Type 2 diabetes mellitus with diabetic chronic kidney disease: Secondary | ICD-10-CM | POA: Diagnosis not present

## 2017-06-15 DIAGNOSIS — F419 Anxiety disorder, unspecified: Secondary | ICD-10-CM | POA: Diagnosis not present

## 2017-06-15 MED ORDER — AMLODIPINE BESYLATE 10 MG PO TABS: 10.0000 mg | ORAL_TABLET | Freq: Every day | ORAL | 3 refills | Status: DC

## 2017-06-15 MED ORDER — TAMSULOSIN HCL 0.4 MG PO CAPS: ORAL_CAPSULE | ORAL | 2 refills | Status: DC

## 2017-06-15 MED ORDER — FUROSEMIDE 40 MG PO TABS: 80.0000 mg | ORAL_TABLET | Freq: Two times a day (BID) | ORAL | 1 refills | Status: DC

## 2017-06-15 MED ORDER — HYDROXYZINE HCL 25 MG PO TABS: 25.0000 mg | ORAL_TABLET | Freq: Every day | ORAL | 1 refills | Status: DC | PRN

## 2017-06-15 NOTE — Progress Notes (Addendum)
Subjective:    CC: DM  HPI:  Diabetes - no hypoglycemic events. No wounds or sores that are not healing well. No increased thirst or urination. Checking glucose at home. Taking medications as prescribed without any side effects.  His orthopedist who has been writing his pain medications is moving to [facility] and he wants to be able to get most of his care here now that he is on dialysis M, W, F for 4 hours.  He is really struggling with sitting there for for so long. He says every since he was a kid he said he's been someone he just has to move and go. He can't sit still for very long at all. NSAID having to sit for dialysis for 4 hours has been a major major struggle for him. They have been giving him Tylenol and Benadryl. The first time he took it actually helped and he was able to sleep through most the entire first session but after that he says it will make him sleepy for about an hour but then as soon as he wakes up and feels even more panicky.   He also wants to know without be willing to take over his pain medications since his orthopedist is moving to [place]. He currently takes 180 tabs of hydrocodone over about a 5-6 week. He has discussed with his provider switching this to his primary care.  He is still working some.  Hypertension- Pt denies chest pain, SOB, dizziness, or heart palpitations.  Taking meds as directed w/o problems.  Denies medication side effects.      Past medical history, Surgical history, Family history not pertinant except as noted below, Social history, Allergies, and medications have been entered into the medical record, reviewed, and corrections made.   Review of Systems: No fevers, chills, night sweats, weight loss, chest pain, or shortness of breath.   Objective:    General: Well Developed, well nourished, and in no acute distress.  Neuro: Alert and oriented x3, extra-ocular muscles intact, sensation grossly intact.  HEENT: Normocephalic, atraumatic   Skin: Warm and dry, no rashes. Cardiac: Regular rate and rhythm, no murmurs rubs or gallops, no lower extremity edema.  Respiratory: Clear to auscultation bilaterally. Not using accessory muscles, speaking in full sentences.   Impression and Recommendations:    DM - Last A1c in August look fantastic. We'll continue current regimen. F/U in 4 months.  Lab Results  Component Value Date   HGBA1C 6.4 (H) 04/24/2017   Chronic pain management/Charcot foot-had him complete an up-to-date pain contract and did a urine drug screen today. He still has about 20 pills left so we'll refill his medications when he is due. Evaluated the [facility] for appropriate prescribing.    Anxiety-we'll try hydroxyzine and see if this works little bit better than the Benadryl. It may or may not actually put him to sleep but if they can calm him down. That sometimes we use this with anxiety. He is really not a good candidate for benzodiazepines because is on chronic narcotic pain medication and we discussed that there is a 4 fold increased risk of death on the combination of these medications.  HTN - needs 90 day refill on amlodopine.   Flu vaccine given.

## 2017-06-16 LAB — DRUG ABUSE PANEL 10-50 NO CONF, U
AMPHETAMINES (1000 ng/mL SCRN): NEGATIVE
BARBITURATES: NEGATIVE
BENZODIAZEPINES: NEGATIVE
COCAINE METABOLITES: NEGATIVE
MARIJUANA MET (50 ng/mL SCRN): NEGATIVE
METHADONE: NEGATIVE
METHAQUALONE: NEGATIVE
OPIATES: POSITIVE — AB
PHENCYCLIDINE: NEGATIVE
PROPOXYPHENE: NEGATIVE

## 2017-06-22 ENCOUNTER — Telehealth: Payer: Self-pay | Admitting: *Deleted

## 2017-06-22 MED ORDER — OXYCODONE-ACETAMINOPHEN 10-325 MG PO TABS: 1.0000 | ORAL_TABLET | Freq: Four times a day (QID) | ORAL | 0 refills | Status: DC | PRN

## 2017-06-22 NOTE — Telephone Encounter (Signed)
Called pt and informed him that prescription is up front for p/u.James Burke'

## 2017-06-22 NOTE — Telephone Encounter (Signed)
Pt called for refill for pain medication. Pt checked in [facility] registry. Ok to fill.Audelia Hives [place]

## 2017-06-26 ENCOUNTER — Encounter: Payer: Self-pay | Admitting: *Deleted

## 2017-06-26 ENCOUNTER — Ambulatory Visit: Payer: BLUE CROSS/BLUE SHIELD | Admitting: Cardiology

## 2017-07-02 ENCOUNTER — Encounter (HOSPITAL_COMMUNITY): Payer: Self-pay | Admitting: Vascular Surgery

## 2017-07-05 ENCOUNTER — Other Ambulatory Visit: Payer: Self-pay | Admitting: Family Medicine

## 2017-07-11 ENCOUNTER — Other Ambulatory Visit: Payer: Self-pay

## 2017-07-11 ENCOUNTER — Other Ambulatory Visit: Payer: Self-pay | Admitting: Family Medicine

## 2017-07-11 MED ORDER — HYDRALAZINE HCL 25 MG PO TABS: 25.0000 mg | ORAL_TABLET | Freq: Three times a day (TID) | ORAL | 3 refills | Status: DC

## 2017-07-25 ENCOUNTER — Encounter (HOSPITAL_COMMUNITY): Payer: Self-pay | Admitting: *Deleted

## 2017-07-25 ENCOUNTER — Inpatient Hospital Stay (HOSPITAL_COMMUNITY)
Admission: EM | Admit: 2017-07-25 | Discharge: 2017-08-02 | DRG: 640 | Disposition: A | Discharge status: Home / Self Care | Payer: BLUE CROSS/BLUE SHIELD | Attending: Internal Medicine | Admitting: Internal Medicine

## 2017-07-25 ENCOUNTER — Other Ambulatory Visit: Payer: Self-pay

## 2017-07-25 ENCOUNTER — Emergency Department (HOSPITAL_COMMUNITY): Payer: BLUE CROSS/BLUE SHIELD

## 2017-07-25 DIAGNOSIS — R0781 Pleurodynia: Secondary | ICD-10-CM | POA: Diagnosis present

## 2017-07-25 DIAGNOSIS — E1122 Type 2 diabetes mellitus with diabetic chronic kidney disease: Secondary | ICD-10-CM | POA: Diagnosis present

## 2017-07-25 DIAGNOSIS — R450 Nervousness: Secondary | ICD-10-CM | POA: Diagnosis not present

## 2017-07-25 DIAGNOSIS — G4700 Insomnia, unspecified: Secondary | ICD-10-CM | POA: Diagnosis present

## 2017-07-25 DIAGNOSIS — L299 Pruritus, unspecified: Secondary | ICD-10-CM | POA: Diagnosis present

## 2017-07-25 DIAGNOSIS — Z992 Dependence on renal dialysis: Secondary | ICD-10-CM | POA: Diagnosis not present

## 2017-07-25 DIAGNOSIS — D631 Anemia in chronic kidney disease: Secondary | ICD-10-CM | POA: Diagnosis present

## 2017-07-25 DIAGNOSIS — Z809 Family history of malignant neoplasm, unspecified: Secondary | ICD-10-CM | POA: Diagnosis not present

## 2017-07-25 DIAGNOSIS — E8889 Other specified metabolic disorders: Secondary | ICD-10-CM | POA: Diagnosis present

## 2017-07-25 DIAGNOSIS — R079 Chest pain, unspecified: Secondary | ICD-10-CM | POA: Diagnosis present

## 2017-07-25 DIAGNOSIS — E8770 Fluid overload, unspecified: Principal | ICD-10-CM | POA: Diagnosis present

## 2017-07-25 DIAGNOSIS — W1830XA Fall on same level, unspecified, initial encounter: Secondary | ICD-10-CM | POA: Diagnosis present

## 2017-07-25 DIAGNOSIS — I10 Essential (primary) hypertension: Secondary | ICD-10-CM | POA: Diagnosis present

## 2017-07-25 DIAGNOSIS — Z794 Long term (current) use of insulin: Secondary | ICD-10-CM | POA: Diagnosis not present

## 2017-07-25 DIAGNOSIS — E11610 Type 2 diabetes mellitus with diabetic neuropathic arthropathy: Secondary | ICD-10-CM | POA: Diagnosis present

## 2017-07-25 DIAGNOSIS — F329 Major depressive disorder, single episode, unspecified: Secondary | ICD-10-CM | POA: Diagnosis present

## 2017-07-25 DIAGNOSIS — N186 End stage renal disease: Secondary | ICD-10-CM | POA: Diagnosis present

## 2017-07-25 DIAGNOSIS — Z888 Allergy status to other drugs, medicaments and biological substances status: Secondary | ICD-10-CM | POA: Diagnosis not present

## 2017-07-25 DIAGNOSIS — F4320 Adjustment disorder, unspecified: Secondary | ICD-10-CM | POA: Diagnosis not present

## 2017-07-25 DIAGNOSIS — I4581 Long QT syndrome: Secondary | ICD-10-CM | POA: Diagnosis present

## 2017-07-25 DIAGNOSIS — Z87891 Personal history of nicotine dependence: Secondary | ICD-10-CM

## 2017-07-25 DIAGNOSIS — N2581 Secondary hyperparathyroidism of renal origin: Secondary | ICD-10-CM | POA: Diagnosis present

## 2017-07-25 DIAGNOSIS — N400 Enlarged prostate without lower urinary tract symptoms: Secondary | ICD-10-CM | POA: Diagnosis present

## 2017-07-25 DIAGNOSIS — R0789 Other chest pain: Secondary | ICD-10-CM | POA: Diagnosis present

## 2017-07-25 DIAGNOSIS — Z79899 Other long term (current) drug therapy: Secondary | ICD-10-CM

## 2017-07-25 DIAGNOSIS — R188 Other ascites: Secondary | ICD-10-CM | POA: Diagnosis present

## 2017-07-25 DIAGNOSIS — F419 Anxiety disorder, unspecified: Secondary | ICD-10-CM

## 2017-07-25 DIAGNOSIS — I132 Hypertensive heart and chronic kidney disease with heart failure and with stage 5 chronic kidney disease, or end stage renal disease: Secondary | ICD-10-CM | POA: Diagnosis present

## 2017-07-25 DIAGNOSIS — K5900 Constipation, unspecified: Secondary | ICD-10-CM | POA: Diagnosis not present

## 2017-07-25 DIAGNOSIS — E8779 Other fluid overload: Secondary | ICD-10-CM | POA: Diagnosis not present

## 2017-07-25 DIAGNOSIS — Z9115 Patient's noncompliance with renal dialysis: Secondary | ICD-10-CM

## 2017-07-25 DIAGNOSIS — K219 Gastro-esophageal reflux disease without esophagitis: Secondary | ICD-10-CM | POA: Diagnosis present

## 2017-07-25 DIAGNOSIS — E1129 Type 2 diabetes mellitus with other diabetic kidney complication: Secondary | ICD-10-CM | POA: Diagnosis present

## 2017-07-25 DIAGNOSIS — I5022 Chronic systolic (congestive) heart failure: Secondary | ICD-10-CM | POA: Diagnosis present

## 2017-07-25 DIAGNOSIS — I5042 Chronic combined systolic (congestive) and diastolic (congestive) heart failure: Secondary | ICD-10-CM | POA: Diagnosis present

## 2017-07-25 DIAGNOSIS — R140 Abdominal distension (gaseous): Secondary | ICD-10-CM

## 2017-07-25 DIAGNOSIS — R112 Nausea with vomiting, unspecified: Secondary | ICD-10-CM | POA: Diagnosis present

## 2017-07-25 DIAGNOSIS — Z7982 Long term (current) use of aspirin: Secondary | ICD-10-CM | POA: Diagnosis not present

## 2017-07-25 LAB — CBC
HCT: 31.5 % — ABNORMAL LOW (ref 39.0–52.0)
Hemoglobin: 10.5 g/dL — ABNORMAL LOW (ref 13.0–17.0)
MCH: 28.6 pg (ref 26.0–34.0)
MCHC: 33.3 g/dL (ref 30.0–36.0)
MCV: 85.8 fL (ref 78.0–100.0)
Platelets: 190 10*3/uL (ref 150–400)
RBC: 3.67 MIL/uL — ABNORMAL LOW (ref 4.22–5.81)
RDW: 17.9 % — ABNORMAL HIGH (ref 11.5–15.5)
WBC: 7.3 10*3/uL (ref 4.0–10.5)

## 2017-07-25 LAB — COMPREHENSIVE METABOLIC PANEL
ALT: 22 U/L (ref 17–63)
AST: 25 U/L (ref 15–41)
Albumin: 3.7 g/dL (ref 3.5–5.0)
Alkaline Phosphatase: 138 U/L — ABNORMAL HIGH (ref 38–126)
Anion gap: 17 — ABNORMAL HIGH (ref 5–15)
BUN: 112 mg/dL — ABNORMAL HIGH (ref 6–20)
CO2: 22 mmol/L (ref 22–32)
Calcium: 9.2 mg/dL (ref 8.9–10.3)
Chloride: 96 mmol/L — ABNORMAL LOW (ref 101–111)
Creatinine, Ser: 10.64 mg/dL — ABNORMAL HIGH (ref 0.61–1.24)
GFR calc Af Amer: 6 mL/min — ABNORMAL LOW (ref >60)
GFR calc non Af Amer: 5 mL/min — ABNORMAL LOW (ref >60)
Glucose, Bld: 124 mg/dL — ABNORMAL HIGH (ref 65–99)
Potassium: 5.1 mmol/L (ref 3.5–5.1)
Sodium: 135 mmol/L (ref 135–145)
Total Bilirubin: 0.7 mg/dL (ref 0.3–1.2)
Total Protein: 7.6 g/dL (ref 6.5–8.1)

## 2017-07-25 LAB — URINALYSIS, ROUTINE W REFLEX MICROSCOPIC
Bilirubin Urine: NEGATIVE
Glucose, UA: NEGATIVE mg/dL
Ketones, ur: NEGATIVE mg/dL
Nitrite: NEGATIVE
Protein, ur: 100 mg/dL — AB
Specific Gravity, Urine: 1.021 (ref 1.005–1.030)
Squamous Epithelial / LPF: NONE SEEN
pH: 5 (ref 5.0–8.0)

## 2017-07-25 LAB — I-STAT TROPONIN, ED: Troponin i, poc: 0.02 ng/mL (ref 0.00–0.08)

## 2017-07-25 LAB — LIPASE, BLOOD: Lipase: 42 U/L (ref 11–51)

## 2017-07-25 MED ORDER — OXYCODONE HCL 5 MG PO TABS
ORAL_TABLET | ORAL | Status: AC
  Administered 2017-07-25: 10 mg via ORAL
  Filled 2017-07-25: qty 2

## 2017-07-25 MED ORDER — AMLODIPINE BESYLATE 10 MG PO TABS
10.0000 mg | ORAL_TABLET | Freq: Every day | ORAL | Status: DC
  Filled 2017-07-25: qty 1

## 2017-07-25 MED ORDER — OXYCODONE-ACETAMINOPHEN 5-325 MG PO TABS
2.0000 | ORAL_TABLET | Freq: Once | ORAL | Status: AC
  Administered 2017-07-25: 2 via ORAL
  Filled 2017-07-25: qty 2

## 2017-07-25 MED ORDER — MORPHINE SULFATE (PF) 4 MG/ML IV SOLN
4.0000 mg | INTRAVENOUS | Status: DC | PRN
  Administered 2017-07-25 – 2017-08-02 (×15): 4 mg via INTRAVENOUS
  Filled 2017-07-25 (×14): qty 1

## 2017-07-25 MED ORDER — SODIUM CHLORIDE 0.9 % IV SOLN: 250.0000 mL | INTRAVENOUS | Status: DC | PRN

## 2017-07-25 MED ORDER — HYDROXYZINE HCL 25 MG PO TABS: 25.0000 mg | ORAL_TABLET | Freq: Every day | ORAL | Status: DC | PRN

## 2017-07-25 MED ORDER — LIDOCAINE HCL 2 % EX GEL
1.0000 "application " | Freq: Three times a day (TID) | CUTANEOUS | Status: DC | PRN
  Filled 2017-07-25: qty 5

## 2017-07-25 MED ORDER — INSULIN ASPART 100 UNIT/ML ~~LOC~~ SOLN: 0.0000 [IU] | Freq: Three times a day (TID) | SUBCUTANEOUS | Status: DC

## 2017-07-25 MED ORDER — SODIUM CHLORIDE 0.9% FLUSH
3.0000 mL | Freq: Two times a day (BID) | INTRAVENOUS | Status: DC
  Administered 2017-07-25 – 2017-08-01 (×14): 3 mL via INTRAVENOUS

## 2017-07-25 MED ORDER — DOCUSATE SODIUM 100 MG PO CAPS
100.0000 mg | ORAL_CAPSULE | Freq: Every day | ORAL | Status: DC
  Administered 2017-07-27 – 2017-08-01 (×6): 100 mg via ORAL
  Filled 2017-07-25 (×6): qty 1

## 2017-07-25 MED ORDER — ZOLPIDEM TARTRATE 5 MG PO TABS
10.0000 mg | ORAL_TABLET | Freq: Every day | ORAL | Status: DC
  Administered 2017-07-25 – 2017-07-29 (×4): 10 mg via ORAL
  Filled 2017-07-25 (×4): qty 2

## 2017-07-25 MED ORDER — ONDANSETRON HCL 4 MG/2ML IJ SOLN
4.0000 mg | Freq: Four times a day (QID) | INTRAMUSCULAR | Status: DC | PRN
  Administered 2017-07-25 – 2017-07-26 (×2): 4 mg via INTRAVENOUS
  Filled 2017-07-25 (×2): qty 2

## 2017-07-25 MED ORDER — TAMSULOSIN HCL 0.4 MG PO CAPS
0.4000 mg | ORAL_CAPSULE | Freq: Every day | ORAL | Status: DC
  Administered 2017-07-27 – 2017-08-02 (×6): 0.4 mg via ORAL
  Filled 2017-07-25 (×7): qty 1

## 2017-07-25 MED ORDER — HYDROXYZINE HCL 25 MG PO TABS
25.0000 mg | ORAL_TABLET | Freq: Three times a day (TID) | ORAL | Status: DC
  Administered 2017-07-25 – 2017-07-30 (×14): 25 mg via ORAL
  Filled 2017-07-25 (×14): qty 1

## 2017-07-25 MED ORDER — ACETAMINOPHEN 650 MG RE SUPP: 650.0000 mg | Freq: Four times a day (QID) | RECTAL | Status: DC | PRN

## 2017-07-25 MED ORDER — FUROSEMIDE 20 MG PO TABS: 80.0000 mg | ORAL_TABLET | Freq: Two times a day (BID) | ORAL | Status: DC

## 2017-07-25 MED ORDER — INSULIN ASPART 100 UNIT/ML ~~LOC~~ SOLN: 0.0000 [IU] | Freq: Every day | SUBCUTANEOUS | Status: DC

## 2017-07-25 MED ORDER — ISOSORBIDE MONONITRATE ER 30 MG PO TB24
30.0000 mg | ORAL_TABLET | Freq: Every day | ORAL | Status: DC
  Administered 2017-07-27 – 2017-08-02 (×6): 30 mg via ORAL
  Filled 2017-07-25 (×7): qty 1

## 2017-07-25 MED ORDER — ONDANSETRON HCL 4 MG PO TABS
4.0000 mg | ORAL_TABLET | Freq: Four times a day (QID) | ORAL | Status: DC | PRN
Start: 2017-07-25 — End: 2017-08-02

## 2017-07-25 MED ORDER — HEPARIN SODIUM (PORCINE) 5000 UNIT/ML IJ SOLN
5000.0000 [IU] | Freq: Three times a day (TID) | INTRAMUSCULAR | Status: DC
  Filled 2017-07-25 (×4): qty 1

## 2017-07-25 MED ORDER — ASPIRIN EC 81 MG PO TBEC
81.0000 mg | DELAYED_RELEASE_TABLET | Freq: Every day | ORAL | Status: DC
  Administered 2017-07-25 – 2017-08-01 (×7): 81 mg via ORAL
  Filled 2017-07-25 (×7): qty 1

## 2017-07-25 MED ORDER — DICLOFENAC SODIUM 1 % TD GEL: 4.0000 g | Freq: Four times a day (QID) | TRANSDERMAL | Status: DC | PRN

## 2017-07-25 MED ORDER — PANTOPRAZOLE SODIUM 40 MG PO TBEC
40.0000 mg | DELAYED_RELEASE_TABLET | Freq: Every day | ORAL | Status: DC
  Administered 2017-07-26 – 2017-08-02 (×8): 40 mg via ORAL
  Filled 2017-07-25 (×9): qty 1

## 2017-07-25 MED ORDER — ACETAMINOPHEN 500 MG PO TABS
1000.0000 mg | ORAL_TABLET | Freq: Four times a day (QID) | ORAL | Status: DC | PRN
  Administered 2017-07-29: 1000 mg via ORAL

## 2017-07-25 MED ORDER — OXYCODONE HCL 5 MG PO TABS
5.0000 mg | ORAL_TABLET | ORAL | Status: DC | PRN
  Administered 2017-07-25 – 2017-07-31 (×23): 10 mg via ORAL
  Administered 2017-07-31: 5 mg via ORAL
  Administered 2017-07-31 – 2017-08-02 (×10): 10 mg via ORAL
  Filled 2017-07-25 (×6): qty 2
  Filled 2017-07-25: qty 1
  Filled 2017-07-25 (×23): qty 2

## 2017-07-25 MED ORDER — SODIUM CHLORIDE 0.9% FLUSH: 3.0000 mL | INTRAVENOUS | Status: DC | PRN

## 2017-07-25 MED ORDER — HYDRALAZINE HCL 25 MG PO TABS
25.0000 mg | ORAL_TABLET | Freq: Three times a day (TID) | ORAL | Status: DC
  Administered 2017-07-25 – 2017-08-02 (×19): 25 mg via ORAL
  Filled 2017-07-25 (×21): qty 1

## 2017-07-25 MED ORDER — CARVEDILOL 25 MG PO TABS
25.0000 mg | ORAL_TABLET | Freq: Two times a day (BID) | ORAL | Status: DC
  Administered 2017-07-26 – 2017-08-02 (×15): 25 mg via ORAL
  Filled 2017-07-25 (×8): qty 1
  Filled 2017-07-25: qty 2
  Filled 2017-07-25 (×6): qty 1

## 2017-07-25 NOTE — ED Notes (Signed)
Dr. Pfeiffer at the bedside.  

## 2017-07-25 NOTE — H&P (Signed)
History and Physical    GIO JANOSKI ZOX:[id number] DOB: 03/20/1966 DOA: 07/25/2017  PCP: [name], MD Patient coming from: home  Chief Complaint: CP and fluid overload  HPI: JATERRIUS RICKETSON is a 51 y.o. male with medical history significant of C. difficile colitis, CHF, diabetes, DVT, ESRD on dialysis, GERD.  Patient presents after being told by his dialysis center to come to the hospital due to missing dialysis for more than a week. Patient endorses missing his dialysis sessions due to intermittent episodes of nausea and vomiting with eating making her feel too weak to be able to go to dialysis. Patient typically dialyzes on Monday Wednesday and Friday. Patient denies abdominal pain, diarrhea, fevers, flank pain, neck stiffness, headache, focal neurological deficits, rash, palpitations, shortness of breath. Symptoms of nausea vomiting and food intolerance are improving without intervention.  L rib cage pain: Patient reports falling approximately 7 days ago. Mechanical fall. Pain throughout left lower rib area since that time. Constant. Worse with. Improves with home opioids. Denies palpitations, substernal chest pain with associated nausea and shortness of breath or radiation. Some relief also with icing area.  ED Course: Oxycodone for rib pain.  Review of Systems: As per HPI otherwise all other systems reviewed and are negative  Ambulatory Status: Limited secondary to foot deformity. Uses orthotic  Past Medical History:  Diagnosis Date  . Anemia   . Arthritis, septic, knee ([place])   . C. difficile colitis 04/18/2008  . Congestive heart failure ([place])   . Diabetes mellitus    Type II  . DVT (deep venous thrombosis) (H[facility])    right leg   . Dyspnea    "when I have too much fluid.'  . ESRD (end stage renal disease) ([place])    Tues, Thurs  . GERD (gastroesophageal reflux disease)   . History of blood transfusion 2017  . Pneumonia     Past Surgical History:  Procedure  Laterality Date  . APPENDECTOMY  1995  . BASCILIC VEIN TRANSPOSITION Left 01/03/2017   Procedure: FIRST STAGE BRACHIAL VEIN TRANSPOSITION;  Surgeon: Conrad [place], MD;  Location: [facility];  Service: Vascular;  Laterality: Left;  . BASCILIC VEIN TRANSPOSITION Left 04/24/2017   Procedure: LEFT SECOND STAGE BRACHIAL VEIN TRANSPOSITION;  Surgeon: Conrad [place], MD;  Location: [facility];  Service: Vascular;  Laterality: Left;  . EYE SURGERY    . INSERTION OF DIALYSIS CATHETER Right 01/03/2017   Procedure: INSERTION OF DIALYSIS CATHETER RIGHT INTERNAL JUGULAR;  Surgeon: Conrad [place], MD;  Location: [facility];  Service: Vascular;  Laterality: Right;  . KNEE SURGERY Left 04/2014  . [place]  Bilateral   . PERICARDIOCENTESIS N/A 01/03/2017   Procedure: Pericardiocentesis;  Surgeon: Burnell Blanks, MD;  Location: [facility];  Service: Cardiovascular;  Laterality: N/A;    Social History   Socioeconomic History  . Marital status: Married    Spouse name: Not on file  . Number of children: 3  . Years of education: Not on file  . Highest education level: Not on file  Social Needs  . Financial resource strain: Not on file  . Food insecurity - worry: Not on file  . Food insecurity - inability: Not on file  . Transportation needs - medical: Not on file  . Transportation needs - non-medical: Not on file  Occupational History  . Not on file  Tobacco Use  . Smoking status: Never Smoker  . Smokeless tobacco: Former Systems developer    Types:  Chew  . Tobacco comment: no chewing 01/2017  Substance and Sexual Activity  . Alcohol use: No    Alcohol/week: 0.0 oz  . Drug use: No  . Sexual activity: Yes  Other Topics Concern  . Not on file  Social History Narrative  . Not on file    Allergies  Allergen Reactions  . Prednisone Other (See Comments)    BLINDNESS > [ ? CSCR ? ]  . Simvastatin Other (See Comments)    MYALGIAS, JOINT PAIN  . Methocarbamol Diarrhea    Family History  Problem Relation Age of Onset    . Heart disease Unknown        No family history  . Cancer Brother       Prior to Admission medications   Medication Sig Start Date End Date Taking? Authorizing Provider  amLODipine (NORVASC) 10 MG tablet Take 1 tablet (10 mg total) by mouth daily. 06/15/17  Yes [name], MD  aspirin 81 MG tablet Take 81 mg by mouth at bedtime.    Yes [provider]  carvedilol (COREG) 25 MG tablet Take 1 tablet (25 mg total) by mouth 2 (two) times daily with a meal. 05/11/17  Yes [name], MD  docusate sodium (COLACE) 100 MG capsule Take 1 capsule (100 mg total) by mouth at bedtime. 04/24/17  Yes Virgina Jock A, PA-C  furosemide (LASIX) 40 MG tablet Take 2 tablets (80 mg total) by mouth 2 (two) times daily. 06/15/17  Yes [name], MD  glipiZIDE (GLUCOTROL) 5 MG tablet Take 5 mg by mouth daily before breakfast. Take 5mg  tablet by mouth in the morning and 2.5mg  in the evening    Yes [provider]  hydrALAZINE (APRESOLINE) 25 MG tablet Take 1 tablet (25 mg total) 3 (three) times daily by mouth. 07/11/17  Yes [name], MD  hydrOXYzine (ATARAX/VISTARIL) 25 MG tablet Take 1-2 tablets (25-50 mg total) by mouth daily as needed. 06/15/17  Yes [name], MD  isosorbide mononitrate (IMDUR) 30 MG 24 hr tablet Take 1 tablet (30 mg total) by mouth daily. 03/22/17 07/25/17 Yes Lelon Perla, MD  omeprazole (PRILOSEC) 40 MG capsule Take 1 capsule (40 mg total) by mouth daily. 04/20/17  Yes [name], MD  tamsulosin (FLOMAX) 0.4 MG CAPS capsule TAKE 2 CAPSULES BY MOUTH DAILY AFTER SUPPER 06/15/17  Yes [name], MD  TRULICITY 3.38 SN/0.5LZ SOPN INJECT 0.75MG  EVERY 7 DAYS ON SATURDAY 03/18/17  Yes [provider]  zolpidem (AMBIEN) 10 MG tablet Take 1 tablet (10 mg total) by mouth at bedtime. 05/11/17  Yes [name], MD  oxyCODONE-acetaminophen (PERCOCET) 10-325 MG tablet Take 1 tablet by mouth every 6  (six) hours as needed for pain. Patient not taking: Reported on 07/25/2017 06/22/17   [name], MD  polyethylene glycol powder [facility]) powder Take 17 g by mouth daily as needed for moderate constipation. Patient not taking: Reported on 07/25/2017 04/24/17   Ansel Bong    Physical Exam: Vitals:   07/25/17 1505 07/25/17 1515 07/25/17 1530 07/25/17 1545  BP: (!) 142/93 (!) 149/94 (!) 143/96 (!) 136/95  Pulse: 76 76 76 74  Resp: 18 12 14 12   Temp:      TempSrc:      SpO2: 97% 98% 99% 98%     General:  Appears calm and comfortable Eyes:  PERRL, EOMI, normal lids, iris ENT:  grossly normal hearing, lips & tongue, mmm  Neck:  no LAD, masses or thyromegaly Cardiovascular:  RRR, no m/r/g. No LE edema.  Respiratory:  CTA bilaterally, no w/r/r. Normal respiratory effort. Abdomen: Distended, soft, NABS Skin:  no rash or induration seen on limited exam Musculoskeletal: R foot deformity noted.  grossly normal tone BUE, good ROM, Psychiatric:  grossly normal mood and affect, speech fluent and appropriate, AOx3 Neurologic:  CN 2-12 grossly intact, moves all extremities in coordinated fashion, sensation intact  Labs on Admission: I have personally reviewed following labs and imaging studies  CBC: Recent Labs  Lab 07/25/17 1110  WBC 7.3  HGB 10.5*  HCT 31.5*  MCV 85.8  PLT 505   Basic Metabolic Panel: Recent Labs  Lab 07/25/17 1110  NA 135  K 5.1  CL 96*  CO2 22  GLUCOSE 124*  BUN 112*  CREATININE 10.64*  CALCIUM 9.2   GFR: CrCl cannot be calculated (Unknown ideal weight.). Liver Function Tests: Recent Labs  Lab 07/25/17 1110  AST 25  ALT 22  ALKPHOS 138*  BILITOT 0.7  PROT 7.6  ALBUMIN 3.7   Recent Labs  Lab 07/25/17 1110  LIPASE 42   No results for input(s): AMMONIA in the last 168 hours. Coagulation Profile: No results for input(s): INR, PROTIME in the last 168 hours. Cardiac Enzymes: No results for input(s): CKTOTAL, CKMB,  CKMBINDEX, TROPONINI in the last 168 hours. BNP (last 3 results) No results for input(s): PROBNP in the last 8760 hours. HbA1C: No results for input(s): HGBA1C in the last 72 hours. CBG: No results for input(s): GLUCAP in the last 168 hours. Lipid Profile: No results for input(s): CHOL, HDL, LDLCALC, TRIG, CHOLHDL, LDLDIRECT in the last 72 hours. Thyroid Function Tests: No results for input(s): TSH, T4TOTAL, FREET4, T3FREE, THYROIDAB in the last 72 hours. Anemia Panel: No results for input(s): VITAMINB12, FOLATE, FERRITIN, TIBC, IRON, RETICCTPCT in the last 72 hours. Urine analysis:    Component Value Date/Time   COLORURINE YELLOW 12/30/2016 1115   APPEARANCEUR CLEAR 12/30/2016 1115   LABSPEC 1.010 12/30/2016 1115   PHURINE 5.0 12/30/2016 1115   GLUCOSEU 50 (A) 12/30/2016 1115   HGBUR SMALL (A) 12/30/2016 1115   BILIRUBINUR NEGATIVE 12/30/2016 1115   BILIRUBINUR negative 10/24/2014 1659   KETONESUR NEGATIVE 12/30/2016 1115   PROTEINUR 100 (A) 12/30/2016 1115   UROBILINOGEN 0.2 10/24/2014 1659   NITRITE NEGATIVE 12/30/2016 1115   LEUKOCYTESUR NEGATIVE 12/30/2016 1115    Creatinine Clearance: CrCl cannot be calculated (Unknown ideal weight.).  Sepsis Labs: @LABRCNTIP (procalcitonin:4,lacticidven:4) )No results found for this or any previous visit (from the past 240 hour(s)).   Radiological Exams on Admission: Dg Chest 2 View  Result Date: 07/25/2017 CLINICAL DATA:  Left anterior rib pain after fall times 1-1/2 weeks. Dyspnea. EXAM: CHEST  2 VIEW COMPARISON:  01/03/2017 CXR FINDINGS: Stable cardiomegaly with minimal aortic atherosclerosis at the arch. No aneurysm. Dialysis catheter tips are seen in the proximal and mid right atrium. Lungs are clear without pneumonic consolidation, effusion or pneumothorax. No acute fracture of the included ribs. No acute osseous abnormality of the dorsal spine. IMPRESSION: Dialysis catheter tips are seen within the right atrium. Stable  cardiomegaly is noted with minimal aortic atherosclerosis. No acute fracture of the included ribs and dorsal spine. Electronically Signed   By: Ashley Royalty M.D.   On: 07/25/2017 15:00    EKG: Independently reviewed. Sinus. Anterolateral leads w/ ST depression which appears close to previous.   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 Last dialysis ~9 days prior to admission. Will likely need multiple rounds of dialysis prior to DC. Dialysis typically on MWF. - Dialysis per renal - consulted by EDP  Chest wall/Rib pain: constant since fall 7 days prior to admission. Plain film w/o evidence of fracture though CXR is suboptimal film choice given location of pts pain. Suspect more related to costochondritis given location and description of pain. Likely to take multiple weeks to improve. Of note Troponin negative and no description of pain consistent w/ cardiac pain.  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 No evidence of acute decompensation. - Continue beta blocker, Lasix -I/O, daily weights  N/V: improving. Diet is not yet back to baseline - Zofran PRN - advance diet slowly. Pt very careful w/ regards to his diet. Placed on renal carb mod diet and pt to self regulate monitor  DM: on trulicity. Last injection 07/22/17 - SSI  GERD: - continue PPI  BPH: - continue flomax  Insomnia: - continue ambien    DVT prophylaxis: Hep  Code Status: full  Family Communication: family memeber   Disposition Plan: pending multiple rounds of dialysis per Renal team  Consults called: Renal  Admission status: inpt - tele (consider transfer to Med surge after initial dialysis)    Waldemar Dickens MD [facility]  If 7PM-7AM, please contact night-coverage [email or web address] Password [email or web address]  07/25/2017, 5:05 PM

## 2017-07-25 NOTE — ED Triage Notes (Signed)
Pt had HD on Monday and missed Wednesday, Friday, and Monday due to being sick. Pt has been vomiting for last few days. Pt fell a couple of weeks and bruised ribs and hip.  Pt has not been examined for fall and has pain with deep breath.  Pt has increased in fluid in abdomen and legs.  Pt has pain to left ribs

## 2017-07-25 NOTE — ED Notes (Signed)
Pt given urinal for urine sample 

## 2017-07-25 NOTE — ED Notes (Signed)
Admitting at the bedside.  

## 2017-07-25 NOTE — ED Notes (Signed)
Provider at bedside

## 2017-07-25 NOTE — ED Notes (Addendum)
Attempted IV access x 2 with no success. Will consult IV team. When attempting to thread IV in vein, catheter kinks.

## 2017-07-25 NOTE — ED Notes (Signed)
IV team at bedside 

## 2017-07-25 NOTE — ED Notes (Signed)
Pt reports he still makes urine however he already voided today and "it's not gonna happen again." This RN mentioned the possibility of an in and out catheter however pt refuses at this time

## 2017-07-25 NOTE — ED Notes (Addendum)
IV and nephrologist at bedside. Thus far, IV team unable to start line on pt. Per provider, if unable to start peripheral IV, will place order for pt dialysis port to be accessed in the event IV medications or abx are ordered. IV team aware.

## 2017-07-25 NOTE — ED Provider Notes (Signed)
[facility] EMERGENCY DEPARTMENT Provider Note   CSN: [id number] Arrival date & time: 07/25/17  0254     History   Chief Complaint Chief Complaint  Patient presents with  . Vascular Access Problem    Missed 3 days  . Chest Pain    HPI James Burke is a 51 y.o. male.  HPI Has Monday Wednesday Friday dialysis schedule.  He reports he was sick last week and over the weekend.  He reports that is why he missed his last 3 scheduled dialysis.  (The patient reports that he is supposed to go 3 times a week but often he goes 2 times a week because it seems to be adequate and although he stays a little volume overloaded he feels all right doing that) he developed nausea with dry heaves and vomiting if he tried to eat over the weekend.  He denies diarrhea or fever.  He reports as of last night he was able to keep a little bit of toast with jam down without vomiting.  Ports he feels better now than he had previously.  He reports that he was told to come to the emergency department because of his missed dialysis and they were very concerned that he was getting significantly volume overloaded and he should get dialyzed at the hospital before going back onto his schedule.  Secondarily, patient reports he fell last week Wednesday.  He reports he really bruised up his left side getting pain in his shoulder and his left ribs.  He reports that he has been icing the ribs every couple of hours and trying to stay in the correct position not to get too much pain.  He did not seek any treatment or evaluation.  He reports that is better than it was but he still is very tender and has to be careful what position he gets into. Past Medical History:  Diagnosis Date  . Anemia   . Arthritis, septic, knee ([place])   . C. difficile colitis 04/18/2008  . Congestive heart failure ([place])   . Diabetes mellitus    Type II  . DVT (deep venous thrombosis) ([facility])    right leg   . Dyspnea    "when I have  too much fluid.'  . ESRD (end stage renal disease) ([place])    Tues, Thurs  . GERD (gastroesophageal reflux disease)   . History of blood transfusion 2017  . Pneumonia     Patient Active Problem List   Diagnosis Date Noted  . Encounter for chronic pain management 06/15/2017  . Pericardial effusion without cardiac tamponade   . ESRD (end stage renal disease) ([place])   . AKI (acute kidney injury) ([place])   . Hypertensive heart and chronic kidney disease with heart failure and stage 1 through stage 4 chronic kidney disease, or chronic kidney disease ([place])   . Acute on chronic combined systolic and diastolic congestive heart failure ([place]) 12/30/2016  . BPH (benign prostatic hyperplasia) 12/30/2016  . Charcot foot due to diabetes mellitus ([place]) 10/23/2015  . Nonischemic cardiomyopathy ([place]) 09/17/2014  . Insomnia 07/16/2014  . Pericardial effusion 06/25/2014  . Chronic combined systolic and diastolic heart failure ([place]) 04/17/2014  . Vitiligo 03/13/2014  . Diabetic retinopathy associated with type 2 diabetes mellitus ([place]) 01/12/2012  . Hypertriglyceridemia 01/12/2012  . Essential hypertension, benign 01/12/2012  . Elevated LFTs 01/12/2012  . Controlled type 2 diabetes with renal manifestation ([place]) 01/03/2012  . Chronic systolic CHF (congestive heart failure), NYHA class  1 ([place]) 01/03/2012    Past Surgical History:  Procedure Laterality Date  . APPENDECTOMY  1995  . BASCILIC VEIN TRANSPOSITION Left 01/03/2017   Procedure: FIRST STAGE BRACHIAL VEIN TRANSPOSITION;  Surgeon: Conrad Kenton, MD;  Location: [facility];  Service: Vascular;  Laterality: Left;  . BASCILIC VEIN TRANSPOSITION Left 04/24/2017   Procedure: LEFT SECOND STAGE BRACHIAL VEIN TRANSPOSITION;  Surgeon: Conrad [place], MD;  Location: [facility];  Service: Vascular;  Laterality: Left;  . EYE SURGERY    . INSERTION OF DIALYSIS CATHETER Right 01/03/2017   Procedure: INSERTION OF DIALYSIS CATHETER RIGHT INTERNAL JUGULAR;  Surgeon: Conrad [place],  MD;  Location: [facility];  Service: Vascular;  Laterality: Right;  . KNEE SURGERY Left 04/2014  . Lazer  Bilateral   . PERICARDIOCENTESIS N/A 01/03/2017   Procedure: Pericardiocentesis;  Surgeon: Burnell Blanks, MD;  Location: [facility];  Service: Cardiovascular;  Laterality: N/A;       Home Medications    Prior to Admission medications   Medication Sig Start Date End Date Taking? Authorizing Provider  amLODipine (NORVASC) 10 MG tablet Take 1 tablet (10 mg total) by mouth daily. 06/15/17  Yes [name], MD  aspirin 81 MG tablet Take 81 mg by mouth at bedtime.    Yes [provider]  carvedilol (COREG) 25 MG tablet Take 1 tablet (25 mg total) by mouth 2 (two) times daily with a meal. 05/11/17  Yes [name], MD  docusate sodium (COLACE) 100 MG capsule Take 1 capsule (100 mg total) by mouth at bedtime. 04/24/17  Yes Virgina Jock A, PA-C  furosemide (LASIX) 40 MG tablet Take 2 tablets (80 mg total) by mouth 2 (two) times daily. 06/15/17  Yes [name], MD  glipiZIDE (GLUCOTROL) 5 MG tablet Take 5 mg by mouth daily before breakfast. Take 5mg  tablet by mouth in the morning and 2.5mg  in the evening    Yes [provider]  hydrALAZINE (APRESOLINE) 25 MG tablet Take 1 tablet (25 mg total) 3 (three) times daily by mouth. 07/11/17  Yes [name], MD  hydrOXYzine (ATARAX/VISTARIL) 25 MG tablet Take 1-2 tablets (25-50 mg total) by mouth daily as needed. 06/15/17  Yes [name], MD  isosorbide mononitrate (IMDUR) 30 MG 24 hr tablet Take 1 tablet (30 mg total) by mouth daily. 03/22/17 07/25/17 Yes Lelon Perla, MD  omeprazole (PRILOSEC) 40 MG capsule Take 1 capsule (40 mg total) by mouth daily. 04/20/17  Yes [name], MD  tamsulosin (FLOMAX) 0.4 MG CAPS capsule TAKE 2 CAPSULES BY MOUTH DAILY AFTER SUPPER 06/15/17  Yes [name], MD  TRULICITY 1.61 WR/6.0AV SOPN INJECT 0.75MG  EVERY 7 DAYS ON  SATURDAY 03/18/17  Yes [provider]  zolpidem (AMBIEN) 10 MG tablet Take 1 tablet (10 mg total) by mouth at bedtime. 05/11/17  Yes [name], MD  oxyCODONE-acetaminophen (PERCOCET) 10-325 MG tablet Take 1 tablet by mouth every 6 (six) hours as needed for pain. Patient not taking: Reported on 07/25/2017 06/22/17   [name], MD  polyethylene glycol powder [facility]) powder Take 17 g by mouth daily as needed for moderate constipation. Patient not taking: Reported on 07/25/2017 04/24/17   Alvia Grove PA-C    Family History Family History  Problem Relation Age of Onset  . Heart disease Unknown        No family history  . Cancer Brother     Social History Social  History   Tobacco Use  . Smoking status: Never Smoker  . Smokeless tobacco: Former Systems developer    Types: Chew  . Tobacco comment: no chewing 01/2017  Substance Use Topics  . Alcohol use: No    Alcohol/week: 0.0 oz  . Drug use: No     Allergies   Prednisone; Simvastatin; and Methocarbamol   Review of Systems Review of Systems 10 Systems reviewed and are negative for acute change except as noted in the HPI.   Physical Exam Updated Vital Signs BP (!) 136/95   Pulse 77   Temp 98.4 F (36.9 C) (Oral)   Resp 13   SpO2 100%   Physical Exam  Constitutional: He is oriented to person, place, and time.  Patient is deconditioned and pale in appearance.  Mental status is clear.  No respiratory distress at rest.  HENT:  Mouth/Throat: Oropharynx is clear and moist.  Eyes: EOM are normal.  Cardiovascular:  Heart rate is normal.  2\6 systolic ejection murmur.  Pulmonary/Chest:  Lungs grossly clear on the right.  Left lung diminished at the base.  Abdominal:  Moderate left lateral abdominal wall tenderness.  No guarding.  Pitting edema of the abdominal wall up to the flanks.  Musculoskeletal:  3+ pitting edema lower extremities.  Neurological: He is alert and oriented to person, place, and  time. No cranial nerve deficit. He exhibits normal muscle tone. Coordination normal.  Skin: Skin is warm and dry. There is pallor.  Psychiatric: He has a normal mood and affect.     ED Treatments / Results  Labs (all labs ordered are listed, but only abnormal results are displayed) Labs Reviewed  COMPREHENSIVE METABOLIC PANEL - Abnormal; Notable for the following components:      Result Value   Chloride 96 (*)    Glucose, Bld 124 (*)    BUN 112 (*)    Creatinine, Ser 10.64 (*)    Alkaline Phosphatase 138 (*)    GFR calc non Af Amer 5 (*)    GFR calc Af Amer 6 (*)    Anion gap 17 (*)    All other components within normal limits  CBC - Abnormal; Notable for the following components:   RBC 3.67 (*)    Hemoglobin 10.5 (*)    HCT 31.5 (*)    RDW 17.9 (*)    All other components within normal limits  LIPASE, BLOOD  URINALYSIS, ROUTINE W REFLEX MICROSCOPIC  I-STAT TROPONIN, ED    EKG  EKG Interpretation  Date/Time:  Tuesday July 25 2017 11:03:52 EST Ventricular Rate:  76 PR Interval:  204 QRS Duration: 126 QT Interval:  428 QTC Calculation: 481 R Axis:   -45 Text Interpretation:  Normal sinus rhythm Left axis deviation Left ventricular hypertrophy with QRS widening T wave abnormality, consider lateral ischemia Abnormal ECG agree. dynamic [place] compared to previous. Confirmed by Charlesetta Shanks [phone]) on 07/25/2017 1:58:46 PM       Radiology No results found.  Procedures Procedures (including critical care time)  Medications Ordered in ED Medications  oxyCODONE-acetaminophen (PERCOCET/ROXICET) 5-325 MG per tablet 2 tablet (not administered)     Initial Impression / Assessment and Plan / ED Course  I have reviewed the triage vital signs and the nursing notes.  Pertinent labs & imaging results that were available during my care of the patient were reviewed by me and considered in my medical decision making (see chart for details).    Consult: Reviewed with  Dr. Lorrene Reid patient's  nephrologist.  She advises patient be admitted for serial dialysis.  Final Clinical Impressions(s) / ED Diagnoses   Final diagnoses:  ESRD (end stage renal disease) ([place])  Other hypervolemia  Non-intractable vomiting with nausea, unspecified vomiting type  Left-sided chest wall pain   Had missed dialysis due to acute illness with nausea vomiting.  He is now significantly volume overloaded with anasarca.  Patient does not have respiratory distress acutely.  Patient is nontoxic.  He does report nausea and vomiting has improved.  Case was reviewed with nephrology who advises for admission for serial dialysis. ED Discharge Orders    None       Charlesetta Shanks, MD 07/25/17 1600

## 2017-07-25 NOTE — Consult Note (Addendum)
[facility] Renal Consultation Note    Indication for Consultation:  Management of ESRD/hemodialysis; anemia, hypertension/volume and secondary hyperparathyroidism PCP:  HPI: James Burke is a 51 y.o. male with ESRD 2/2 to DM on hemodialysis since 01/04/2017.  PMH significant for DM, systolic HF with EF 05-69%, Grade I diastolic dysfunction, C diff colitis, R Charcot foot, GERD, HTN, PNA.   Patient presents to ED after missing one week (3 treatments) of HD. Patient says he had nausea and vomiting for past 3 days. Also fell Tuesday one week ago and sustained injury to L side of face, rib cage and L hip. He did not seek medical treatment following fall. Wife says that he has been having itching/pruritis past few days. Consider that nausea/pruritis may be symptoms of uremia. He has generalized anascara, no C/O SOB, chest pain at present. Still C/O nausea.   CXR shows Lungs are clear without pneumonic consolidation,effusion or pneumothorax. No evidence of fractured ribs included in film. K+ 5.1 Scr 10.64 BUN 112. CO2 22 HGB 10.5 PLT 190 BS 124. EKG shows SR with LAD.   He has been poorly compliant with dialysis in general, attributing to anxiety, can't sit in the chair without feeling like he will explode. Originally was started at 2 days/week, lost his remaining residual renal function, then was increased to TIW but averages at most 2 TMT's/week, never full time. Wife says his anxiety has carried over to home, short tempered, easily agitated. Has AVF but not used regularly.   Past Medical History:  Diagnosis Date  . Anemia   . Arthritis, septic, knee ([place])   . C. difficile colitis 04/18/2008  . Congestive heart failure ([place])   . Diabetes mellitus    Type II  . DVT (deep venous thrombosis) ([facility])    right leg   . Dyspnea    "when I have too much fluid.'  . ESRD (end stage renal disease) ([place])    Tues, Thurs  . GERD (gastroesophageal reflux disease)   . History of blood  transfusion 2017  . Pneumonia    Past Surgical History:  Procedure Laterality Date  . APPENDECTOMY  1995  . BASCILIC VEIN TRANSPOSITION Left 01/03/2017   Procedure: FIRST STAGE BRACHIAL VEIN TRANSPOSITION;  Surgeon: Conrad Hemphill, MD;  Location: [facility];  Service: Vascular;  Laterality: Left;  . BASCILIC VEIN TRANSPOSITION Left 04/24/2017   Procedure: LEFT SECOND STAGE BRACHIAL VEIN TRANSPOSITION;  Surgeon: Conrad Lyman, MD;  Location: [facility];  Service: Vascular;  Laterality: Left;  . EYE SURGERY    . INSERTION OF DIALYSIS CATHETER Right 01/03/2017   Procedure: INSERTION OF DIALYSIS CATHETER RIGHT INTERNAL JUGULAR;  Surgeon: Conrad Whitman, MD;  Location: [facility];  Service: Vascular;  Laterality: Right;  . KNEE SURGERY Left 04/2014  . Lazer  Bilateral   . PERICARDIOCENTESIS N/A 01/03/2017   Procedure: Pericardiocentesis;  Surgeon: Burnell Blanks, MD;  Location: [facility];  Service: Cardiovascular;  Laterality: N/A;   Family History  Problem Relation Age of Onset  . Heart disease Unknown        No family history  . Cancer Brother    Social History:  reports that  has never smoked. He has quit using smokeless tobacco. His smokeless tobacco use included chew. He reports that he does not drink alcohol or use drugs. Allergies  Allergen Reactions  . Prednisone Other (See Comments)    BLINDNESS > [ ? CSCR ? ]  . Simvastatin Other (  See Comments)    MYALGIAS, JOINT PAIN  . Methocarbamol Diarrhea   Prior to Admission medications   Medication Sig Start Date End Date Taking? Authorizing Provider  amLODipine (NORVASC) 10 MG tablet Take 1 tablet (10 mg total) by mouth daily. 06/15/17  Yes [name], MD  aspirin 81 MG tablet Take 81 mg by mouth at bedtime.    Yes [provider]  carvedilol (COREG) 25 MG tablet Take 1 tablet (25 mg total) by mouth 2 (two) times daily with a meal. 05/11/17  Yes [name], MD  docusate sodium (COLACE) 100 MG capsule Take 1  capsule (100 mg total) by mouth at bedtime. 04/24/17  Yes Virgina Jock A, PA-C  furosemide (LASIX) 40 MG tablet Take 2 tablets (80 mg total) by mouth 2 (two) times daily. 06/15/17  Yes [name], MD  glipiZIDE (GLUCOTROL) 5 MG tablet Take 5 mg by mouth daily before breakfast. Take 5mg  tablet by mouth in the morning and 2.5mg  in the evening    Yes [provider]  hydrALAZINE (APRESOLINE) 25 MG tablet Take 1 tablet (25 mg total) 3 (three) times daily by mouth. 07/11/17  Yes [name], MD  hydrOXYzine (ATARAX/VISTARIL) 25 MG tablet Take 1-2 tablets (25-50 mg total) by mouth daily as needed. 06/15/17  Yes [name], MD  isosorbide mononitrate (IMDUR) 30 MG 24 hr tablet Take 1 tablet (30 mg total) by mouth daily. 03/22/17 07/25/17 Yes Lelon Perla, MD  omeprazole (PRILOSEC) 40 MG capsule Take 1 capsule (40 mg total) by mouth daily. 04/20/17  Yes [name], MD  tamsulosin (FLOMAX) 0.4 MG CAPS capsule TAKE 2 CAPSULES BY MOUTH DAILY AFTER SUPPER 06/15/17  Yes [name], MD  TRULICITY 7.32 KG/2.5KY SOPN INJECT 0.75MG  EVERY 7 DAYS ON SATURDAY 03/18/17  Yes [provider]  zolpidem (AMBIEN) 10 MG tablet Take 1 tablet (10 mg total) by mouth at bedtime. 05/11/17  Yes [name], MD  oxyCODONE-acetaminophen (PERCOCET) 10-325 MG tablet Take 1 tablet by mouth every 6 (six) hours as needed for pain. Patient not taking: Reported on 07/25/2017 06/22/17   [name], MD  polyethylene glycol powder [facility]) powder Take 17 g by mouth daily as needed for moderate constipation. Patient not taking: Reported on 07/25/2017 04/24/17   Virgina Jock A, PA-C   No current facility-administered medications for this encounter.    Current Outpatient Medications  Medication Sig Dispense Refill  . amLODipine (NORVASC) 10 MG tablet Take 1 tablet (10 mg total) by mouth daily. 90 tablet 3  . aspirin 81 MG tablet Take 81 mg by mouth  at bedtime.     . carvedilol (COREG) 25 MG tablet Take 1 tablet (25 mg total) by mouth 2 (two) times daily with a meal. 180 tablet 1  . docusate sodium (COLACE) 100 MG capsule Take 1 capsule (100 mg total) by mouth at bedtime.    . furosemide (LASIX) 40 MG tablet Take 2 tablets (80 mg total) by mouth 2 (two) times daily. 180 tablet 1  . glipiZIDE (GLUCOTROL) 5 MG tablet Take 5 mg by mouth daily before breakfast. Take 5mg  tablet by mouth in the morning and 2.5mg  in the evening     . hydrALAZINE (APRESOLINE) 25 MG tablet Take 1 tablet (25 mg total) 3 (three) times daily by mouth. 90 tablet 3  . hydrOXYzine (ATARAX/VISTARIL) 25 MG tablet Take 1-2 tablets (25-50 mg total) by mouth daily as needed. 60 tablet 1  .  isosorbide mononitrate (IMDUR) 30 MG 24 hr tablet Take 1 tablet (30 mg total) by mouth daily. 90 tablet 3  . omeprazole (PRILOSEC) 40 MG capsule Take 1 capsule (40 mg total) by mouth daily. 90 capsule 1  . tamsulosin (FLOMAX) 0.4 MG CAPS capsule TAKE 2 CAPSULES BY MOUTH DAILY AFTER SUPPER 180 capsule 2  . TRULICITY 7.61 YW/7.3XT SOPN INJECT 0.75MG  EVERY 7 DAYS ON SATURDAY  6  . zolpidem (AMBIEN) 10 MG tablet Take 1 tablet (10 mg total) by mouth at bedtime. 90 tablet 0  . oxyCODONE-acetaminophen (PERCOCET) 10-325 MG tablet Take 1 tablet by mouth every 6 (six) hours as needed for pain. (Patient not taking: Reported on 07/25/2017) 180 tablet 0  . polyethylene glycol powder (MIRALAX) powder Take 17 g by mouth daily as needed for moderate constipation. (Patient not taking: Reported on 07/25/2017)     Recent Labs  Lab 07/25/17 1110  NA 135  K 5.1  CL 96*  CO2 22  GLUCOSE 124*  BUN 112*  CREATININE 10.64*  CALCIUM 9.2    Recent Labs  Lab 07/25/17 1110  AST 25  ALT 22  ALKPHOS 138*  BILITOT 0.7  PROT 7.6  ALBUMIN 3.7   Recent Labs  Lab 07/25/17 1110  LIPASE 42    Recent Labs  Lab 07/25/17 1110  WBC 7.3  HGB 10.5*  HCT 31.5*  MCV 85.8  PLT 190   Studies/Results: Dg  Chest 2 View  Result Date: 07/25/2017 CLINICAL DATA:  Left anterior rib pain after fall times 1-1/2 weeks. Dyspnea. EXAM: CHEST  2 VIEW COMPARISON:  01/03/2017 CXR FINDINGS: Stable cardiomegaly with minimal aortic atherosclerosis at the arch. No aneurysm. Dialysis catheter tips are seen in the proximal and mid right atrium. Lungs are clear without pneumonic consolidation, effusion or pneumothorax. No acute fracture of the included ribs. No acute osseous abnormality of the dorsal spine. IMPRESSION: Dialysis catheter tips are seen within the right atrium. Stable cardiomegaly is noted with minimal aortic atherosclerosis. No acute fracture of the included ribs and dorsal spine. Electronically Signed   By: Ashley Royalty M.D.   On: 07/25/2017 15:00    ROS: As per HPI otherwise negative.   Physical Exam: Vitals:   07/25/17 1230 07/25/17 1248 07/25/17 1415 07/25/17 1505  BP:  (!) 136/95  (!) 142/93  Pulse: 77 76 77 76  Resp: 14 19 13 18   Temp:      TempSrc:      SpO2: 100% 99% 100% 97%     General: Chronically ill appearing male in NAD. Head: Normocephalic, atraumatic, sclera non-icteric, mucus membranes are moist Neck: Supple. JVD halfway to mandible.  Lungs: Bilateral breath sounds decreased in bases with few bibasilar crackles. No WOB.  Heart: RRR with S1 S2. No murmurs, rubs, or gallops appreciated. Abdomen: Slightly distended with 2+ pitting edema. Active BS.  M-S:  Strength and tone appear normal for age. R Charcot foot-has special boot built for foot.  Lower extremities:2+ pitting edema LLE support hose RLE 1+ edema.  Neuro: Alert and oriented X 3. Moves all extremities spontaneously. Psych:  Responds to questions appropriately with a normal affect. Dialysis Access: RIJ TDC Drsg intact. LFA AVF  Dialysis Orders: [facility] MWF 4 hr 180 NRe 400/800 87 kg 2.0 K/2.0 Ca -No heparin -Mircera 75 mcg IV q 2 week (recented resumed not given yet) -Venofer 100 mg IV X 10 (5/5 doses given-last dose  07/17/17 Fe 52 Tsat 21% 07/03/17) -Hectoral 1 mcg IV TIW (Last PTH  18!) BMD meds: Calcium acetate 667 mg PO TID AC (last Phos 3.2 Ca 10.4 07/03/17)  Assessment/Plan: 1.  Generalized anascara/volume Overload 2/2 non-compliance with HD: Has 2+ pitting edema abdomen, ascites, 2+ pitting BLE. Will have HD today actually on schedule (he missed today didn't know holiday schedule). Will require serial HD for volume removal.   2. Anxiety and depression - interfering significantly with pt's ability to complete a dialysis and is spilling over into home with agitation, anger - wife very frustrated, pt equally so. Is on hydroxyzine for anxiety by his primary care (I changed from prn to standing). I personally think would benefit from antidepressant, and even psychiatric outpt care. Please consider addition of antidepressant.  3.  L Rib pain: Fall last Tuesday-no evidence of rib fx on Xray. Per primary 4.  ESRD -  MWF-will have HD today and tomorrow, then again on Friday to get back on schedule. K+ 5.1. 2.0 K bath. No heparin.  5.  Hypertension/volume  - Please see problem # 1. BP with reasonable control. Continue home meds- carvedilol 25 mg PO BID, Lasix 80 mg PO BID on non-HD days, amlodipine 10 mg PO q HS.no longer enough residual renal function to warrant continuation of lasix so will stop.   6.  Anemia  - HGB 10.5 No recent ESA doses. Cont Fe load from OP center.  7.  Metabolic bone disease - Renal function panel with HD today. Continue binders when able to eat, hold VDRA.  8.  Nutrition -NPO at present. 9. DM: per primary  Rita H. Owens Shark, NP-C 07/25/2017, 3:28 PM  [employer] [phone]  I have seen and examined this patient and agree with plan and assessment in the above note with renal recommendations/intervention highlighted. Pt has been poorly compliant with dialysis, missed an entire week, and generally averages only 2 tmt's per week max. He attributes to high anxiety and  inability to sit in the dialysis chair for more than a couple of hours. Wife reports that his anxiety and depression are spilling over into family dynamic. Now presents with no HD for 1 week, anasarca, marked azotemia, nausea and vomiting that is likely uremic. Plan back to back TMT's if he will agree. I don't know how to deal with his severe anxiety and depression - only med for this now is hydroxyzine - I think would benefit from psychiatric evaluation.    Carver Murakami B,MD 07/25/2017 4:29 PM

## 2017-07-25 NOTE — ED Notes (Addendum)
Pt complaining of pain to phlebotomy collect site. No excess bleeding noted to gauze however swelling noted above and below puncture mark. Warm compress applied to pt arm. Pt given ice chips per admitting provider. Site of swelling wrapped with coband.

## 2017-07-25 NOTE — ED Notes (Addendum)
Pt in XR at this time. Will obtain blood work and administer medication upon return

## 2017-07-26 ENCOUNTER — Inpatient Hospital Stay (HOSPITAL_COMMUNITY): Payer: BLUE CROSS/BLUE SHIELD

## 2017-07-26 LAB — COMPREHENSIVE METABOLIC PANEL
ALT: 21 U/L (ref 17–63)
AST: 25 U/L (ref 15–41)
Albumin: 3.3 g/dL — ABNORMAL LOW (ref 3.5–5.0)
Alkaline Phosphatase: 131 U/L — ABNORMAL HIGH (ref 38–126)
Anion gap: 13 (ref 5–15)
BUN: 58 mg/dL — ABNORMAL HIGH (ref 6–20)
CO2: 24 mmol/L (ref 22–32)
Calcium: 8.3 mg/dL — ABNORMAL LOW (ref 8.9–10.3)
Chloride: 98 mmol/L — ABNORMAL LOW (ref 101–111)
Creatinine, Ser: 7.06 mg/dL — ABNORMAL HIGH (ref 0.61–1.24)
GFR calc Af Amer: 9 mL/min — ABNORMAL LOW (ref >60)
GFR calc non Af Amer: 8 mL/min — ABNORMAL LOW (ref >60)
Glucose, Bld: 121 mg/dL — ABNORMAL HIGH (ref 65–99)
Potassium: 3.9 mmol/L (ref 3.5–5.1)
Sodium: 135 mmol/L (ref 135–145)
Total Bilirubin: 1 mg/dL (ref 0.3–1.2)
Total Protein: 6.8 g/dL (ref 6.5–8.1)

## 2017-07-26 LAB — GLUCOSE, CAPILLARY
Glucose-Capillary: 96 mg/dL (ref 65–99)
Glucose-Capillary: 100 mg/dL — ABNORMAL HIGH (ref 65–99)
Glucose-Capillary: 128 mg/dL — ABNORMAL HIGH (ref 65–99)
Glucose-Capillary: 104 mg/dL — ABNORMAL HIGH (ref 65–99)

## 2017-07-26 LAB — CBC
HCT: 29 % — ABNORMAL LOW (ref 39.0–52.0)
Hemoglobin: 9.7 g/dL — ABNORMAL LOW (ref 13.0–17.0)
MCH: 29 pg (ref 26.0–34.0)
MCHC: 33.4 g/dL (ref 30.0–36.0)
MCV: 86.6 fL (ref 78.0–100.0)
Platelets: 164 10*3/uL (ref 150–400)
RBC: 3.35 MIL/uL — ABNORMAL LOW (ref 4.22–5.81)
RDW: 18 % — ABNORMAL HIGH (ref 11.5–15.5)
WBC: 6.6 10*3/uL (ref 4.0–10.5)

## 2017-07-26 MED ORDER — HEPARIN SODIUM (PORCINE) 1000 UNIT/ML DIALYSIS
1000.0000 [IU] | INTRAMUSCULAR | Status: DC | PRN
  Administered 2017-07-27: 1000 [IU] via INTRAVENOUS_CENTRAL
  Filled 2017-07-26 (×2): qty 1

## 2017-07-26 MED ORDER — AMLODIPINE BESYLATE 5 MG PO TABS
5.0000 mg | ORAL_TABLET | Freq: Every day | ORAL | Status: DC
  Administered 2017-07-27 – 2017-08-01 (×5): 5 mg via ORAL
  Filled 2017-07-26 (×5): qty 1

## 2017-07-26 MED ORDER — ALTEPLASE 2 MG IJ SOLR: 2.0000 mg | Freq: Once | INTRAMUSCULAR | Status: DC | PRN

## 2017-07-26 MED ORDER — DARBEPOETIN ALFA 100 MCG/0.5ML IJ SOSY
100.0000 ug | PREFILLED_SYRINGE | INTRAMUSCULAR | Status: DC
  Administered 2017-07-27: 100 ug via INTRAVENOUS

## 2017-07-26 MED ORDER — LIDOCAINE HCL (PF) 1 % IJ SOLN: 5.0000 mL | INTRAMUSCULAR | Status: DC | PRN

## 2017-07-26 MED ORDER — SODIUM CHLORIDE 0.9 % IV SOLN: 100.0000 mL | INTRAVENOUS | Status: DC | PRN

## 2017-07-26 MED ORDER — LIDOCAINE-PRILOCAINE 2.5-2.5 % EX CREA
1.0000 "application " | TOPICAL_CREAM | CUTANEOUS | Status: DC | PRN
  Filled 2017-07-26: qty 5

## 2017-07-26 MED ORDER — SODIUM CHLORIDE 0.9 % IV SOLN
125.0000 mg | INTRAVENOUS | Status: DC
  Administered 2017-07-27 – 2017-08-01 (×3): 125 mg via INTRAVENOUS
  Filled 2017-07-26 (×7): qty 10

## 2017-07-26 MED ORDER — PENTAFLUOROPROP-TETRAFLUOROETH EX AERO: 1.0000 "application " | INHALATION_SPRAY | CUTANEOUS | Status: DC | PRN

## 2017-07-26 NOTE — Consult Note (Signed)
Patient was seen today. He declines a psychiatry consult and reports that he did not request to speak to a psychiatrist and he does not have any psychiatric concerns. He was informed that if this changes that he can later request to see a psychiatrist.    Faythe Dingwall, DO 07/26/2017 1:44 PM

## 2017-07-26 NOTE — Progress Notes (Signed)
PROGRESS NOTE    James Burke  OZD:[id number] DOB: June 16, 1966 DOA: 07/25/2017 PCP: [name], MD    Brief Narrative: James Burke is a 51 y.o. male with medical history significant of C. difficile colitis, CHF, diabetes, DVT, ESRD on dialysis, GERD.  Patient presents after being told by his dialysis center to come to the hospital due to missing dialysis for more than a week. Patient endorses missing his dialysis sessions due to intermittent episodes of nausea and vomiting with eating making her feel too weak to be able to go to dialysis. Patient typically dialyzes on Monday Wednesday and Friday. Patient denies abdominal pain, diarrhea, fevers, flank pain, neck stiffness, headache, focal neurological deficits, rash, palpitations, shortness of breath. Symptoms of nausea vomiting and food intolerance are improving without intervention.  L rib cage pain: Patient reports falling approximately 7 days ago. Mechanical fall. Pain throughout left lower rib area since that time. Constant. Worse with. Improves with home opioids. Denies palpitations, substernal chest pain with associated nausea and shortness of breath or radiation. Some relief also with icing area.     Assessment & Plan:   Active Problems:   Controlled type 2 diabetes with renal manifestation (HCC)   Chronic systolic CHF (congestive heart failure), NYHA class 1 (HCC)   Essential hypertension, benign   Charcot foot due to diabetes mellitus ([place])   ESRD (end stage renal disease) (HCC)   Chest pain   Rib pain  1-ESRD; volume overload;  Plan for HD today again.  Appreciate nephrology help.   Depression, anxiety;  Psych consulted.   2-chest wall, ribs pain.  X ary negative for fracture.  Pain management.  Check CT abdomen rule out hematoma.   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 No evidence of acute decompensation. - Continue beta blocker -I/O, daily weights  N/V:  - Zofran PRN - suspect related to uremia.   DM: on trulicity. Last injection 07/22/17 - SSI  GERD: - continue PPI     DVT prophylaxis: Heparin Code Status: Full code.  Family Communication: wife at bedside.  Disposition Plan: to be determine   Consultants:   Nephrology    Procedures: HD   Antimicrobials: none   Subjective: He is still complaining of right side rib cage pain.   Objective: Vitals:   07/25/17 2321 07/26/17 0320 07/26/17 0508 07/26/17 1300  BP: 126/80 (!) 131/93 124/87 133/83  Pulse: 91   81  Resp: 18  18 20   Temp: (!) 97.5 F (36.4 C)  98.3 F (36.8 C) 98 F (36.7 C)  TempSrc: Oral  Oral Oral  SpO2: 99%  98% 100%  Weight: 95.1 kg (209 lb 9.6 oz)  94.8 kg (209 lb)   Height: 6' (1.829 m)       Intake/Output Summary (Last 24 hours) at 07/26/2017 1603 Last data filed at 07/26/2017 0930 Gross per 24 hour  Intake 360 ml  Output 3355 ml  Net -2995 ml   Filed Weights   07/25/17 2321 07/26/17 0508  Weight: 95.1 kg (209 lb 9.6 oz) 94.8 kg (209 lb)    Examination:  General exam: Appears calm and comfortable  Respiratory system: Clear to auscultation. Respiratory effort normal. Cardiovascular system: S1 & S2 heard, RRR. No JVD, murmurs, rubs, gallops or clicks. plus 2 edema  Gastrointestinal system: Abdomen is distended, soft and nontender. No organomegaly or masses felt.  Normal bowel sounds heard. Central nervous system: Alert and oriented. No focal neurological deficits. Extremities: Symmetric 5 x 5 power.    Data Reviewed: I have personally reviewed following labs and imaging studies  CBC: Recent Labs  Lab 07/25/17 1110 07/26/17 0400  WBC 7.3 6.6  HGB 10.5* 9.7*  HCT 31.5* 29.0*  MCV 85.8 86.6  PLT 190 009   Basic Metabolic Panel: Recent Labs  Lab 07/25/17 1110 07/26/17 0400  NA 135 135  K 5.1 3.9  CL 96* 98*  CO2  22 24  GLUCOSE 124* 121*  BUN 112* 58*  CREATININE 10.64* 7.06*  CALCIUM 9.2 8.3*   GFR: Estimated Creatinine Clearance: 15.1 mL/min (A) (by C-G formula based on SCr of 7.06 mg/dL (H)). Liver Function Tests: Recent Labs  Lab 07/25/17 1110 07/26/17 0400  AST 25 25  ALT 22 21  ALKPHOS 138* 131*  BILITOT 0.7 1.0  PROT 7.6 6.8  ALBUMIN 3.7 3.3*   Recent Labs  Lab 07/25/17 1110  LIPASE 42   No results for input(s): AMMONIA in the last 168 hours. Coagulation Profile: No results for input(s): INR, PROTIME in the last 168 hours. Cardiac Enzymes: No results for input(s): CKTOTAL, CKMB, CKMBINDEX, TROPONINI in the last 168 hours. BNP (last 3 results) No results for input(s): PROBNP in the last 8760 hours. HbA1C: No results for input(s): HGBA1C in the last 72 hours. CBG: Recent Labs  Lab 07/26/17 0753 07/26/17 1115  GLUCAP 96 100*   Lipid Profile: No results for input(s): CHOL, HDL, LDLCALC, TRIG, CHOLHDL, LDLDIRECT in the last 72 hours. Thyroid Function Tests: No results for input(s): TSH, T4TOTAL, FREET4, T3FREE, THYROIDAB in the last 72 hours. Anemia Panel: No results for input(s): VITAMINB12, FOLATE, FERRITIN, TIBC, IRON, RETICCTPCT in the last 72 hours. Sepsis Labs: No results for input(s): PROCALCITON, LATICACIDVEN in the last 168 hours.  No results found for this or any previous visit (from the past 240 hour(s)).       Radiology Studies: Ct Abdomen Pelvis Wo Contrast  Result Date: 07/26/2017 CLINICAL DATA:  Vomiting, recent fall, pain with deep inspiration, increased fluid in the abdomen and 1 8. Left rib pain. Initial encounter. Worsening in severe left-sided abdominal pain. EXAM: CT ABDOMEN AND PELVIS WITHOUT CONTRAST TECHNIQUE: Multidetector CT imaging of the abdomen and pelvis was performed following the standard protocol without IV contrast. COMPARISON:  03/05/2014. FINDINGS: Lower chest: Small right pleural effusion. Heart is enlarged. Decreased  attenuation of the intravascular compartment is indicative of anemia. No pericardial effusion. Hepatobiliary: There is streak artifact throughout the posterior abdomen due to the patient's arms. This limits diagnostic quality. Liver and gallbladder are grossly unremarkable. No definite biliary ductal dilatation. Pancreas: Negative. Spleen: Partially obscured by streak artifact from the patient's arms. Grossly unremarkable. Adrenals/Urinary Tract: Adrenal glands are unremarkable. Kidneys are poorly evaluated due to streak artifact from the patient's arms as well as lack of post-contrast imaging. Kidneys are grossly unremarkable. Bladder is low in volume. Stomach/Bowel: Stomach, small bowel and colon are grossly unremarkable. Vascular/Lymphatic: Left renal artery and bilateral common iliac artery calcification. Inguinal lymph nodes measure up to 13 mm on the left. Borderline enlarged abdominal retroperitoneal lymph nodes, up to 12 mm in the left periaortic station. Reproductive: Prostate is visualized. Other: Large ascites. No definite high attenuation to suggest hemorrhage. Diffuse body wall edema. Presacral edema. Musculoskeletal: No fracture. IMPRESSION: 1. Exam quality is limited by lack of post-contrast imaging and streak artifact through the posterior abdomen from the patient's arms. No  definite evidence of acute trauma. 2. Large ascites, diffuse body wall edema and small right pleural effusion. Electronically Signed   By: Lorin Picket M.D.   On: 07/26/2017 14:43   Dg Chest 2 View  Result Date: 07/25/2017 CLINICAL DATA:  Left anterior rib pain after fall times 1-1/2 weeks. Dyspnea. EXAM: CHEST  2 VIEW COMPARISON:  01/03/2017 CXR FINDINGS: Stable cardiomegaly with minimal aortic atherosclerosis at the arch. No aneurysm. Dialysis catheter tips are seen in the proximal and mid right atrium. Lungs are clear without pneumonic consolidation, effusion or pneumothorax. No acute fracture of the included ribs. No  acute osseous abnormality of the dorsal spine. IMPRESSION: Dialysis catheter tips are seen within the right atrium. Stable cardiomegaly is noted with minimal aortic atherosclerosis. No acute fracture of the included ribs and dorsal spine. Electronically Signed   By: Ashley Royalty M.D.   On: 07/25/2017 15:00        Scheduled Meds: . amLODipine  5 mg Oral QHS  . aspirin EC  81 mg Oral QHS  . carvedilol  25 mg Oral BID WC  . darbepoetin (ARANESP) injection - DIALYSIS  100 mcg Intravenous Q Wed-HD  . docusate sodium  100 mg Oral QHS  . heparin  5,000 Units Subcutaneous Q8H  . hydrALAZINE  25 mg Oral TID  . hydrOXYzine  25 mg Oral TID  . insulin aspart  0-5 Units Subcutaneous QHS  . insulin aspart  0-9 Units Subcutaneous TID WC  . isosorbide mononitrate  30 mg Oral Daily  . pantoprazole  40 mg Oral Daily  . sodium chloride flush  3 mL Intravenous Q12H  . tamsulosin  0.4 mg Oral Daily  . zolpidem  10 mg Oral QHS   Continuous Infusions: . sodium chloride    . ferric gluconate (FERRLECIT/NULECIT) IV       LOS: 1 day    Time spent: 35 minutes.     Elmarie Shiley, MD [facility] Pager [phone]  If 7PM-7AM, please contact night-coverage [email or web address] Password [email or web address] 07/26/2017, 4:03 PM

## 2017-07-26 NOTE — Progress Notes (Addendum)
Called hemodialysis to ask about when patient was scheduled to go, told that he was not on the schedule since he had been dialyzed yesterday, Tuesday 11/20. They are going on the holiday schedule. Paged MD Regalado- she was surprised also, and advised to call nephrology.   Paged on call nephrology MD Deterding to see if patient can go today. He stated if he received dialysis Tuesday, then he won't go until Friday. Explained that patient is here for fluid overload. Stated there was not much more he could do at the moment for this situation. Will pass information to oncoming RN.

## 2017-07-26 NOTE — Plan of Care (Signed)
Patient denies dyspnea. Even, unlabored breathing. Patient saturating well on room air. Will continue to monitor closely.

## 2017-07-26 NOTE — Progress Notes (Signed)
[facility] Rounding Note  Subjective/Interval History:  Still with lots of left side pain No fractures on rib films Nausea this AM, dry heaves  Objective Vital signs in last 24 hours: Vitals:   07/25/17 2225 07/25/17 2321 07/26/17 0320 07/26/17 0508  BP: 132/85 126/80 (!) 131/93 124/87  Pulse: 85 91    Resp: (!) 24 18  18   Temp: (!) 97.5 F (36.4 C) (!) 97.5 F (36.4 C)  98.3 F (36.8 C)  TempSrc: Oral Oral  Oral  SpO2: 97% 99%  98%  Weight:  95.1 kg (209 lb 9.6 oz)  94.8 kg (209 lb)  Height:  6' (1.829 m)     Weight change:   Intake/Output Summary (Last 24 hours) at 07/26/2017 1124 Last data filed at 07/25/2017 2225 Gross per 24 hour  Intake 240 ml  Output 3355 ml  Net -3115 ml   Physical Exam:  Blood pressure 124/87, pulse 91, temperature 98.3 F (36.8 C), temperature source Oral, resp. rate 18, height 6' (1.829 m), weight 94.8 kg (209 lb), SpO2 98 %.  Chronically ill appearing male in NAD. Wife in the room with him Neck: Supple.  JVD halfway to mandible.  Lungs: Bilateral breath sounds decreased in bases with few bibasilar crackles. No WOB.  RRR with S1 S2.  No murmurs, rubs, or gallops appreciated. Abdomen: A little softer, less tight than yesterday. Tender left side.   M-S:  Strength and tone appear normal for age. R Charcot foot  2+ pitting LE edema to hips Alert and oriented X 3. Moves all extremities spontaneously. Depressed affect, little eye contact Dialysis Access: RIJ TDC Drsg intact. L upper arm AVF good bruit    Recent Labs  Lab 07/25/17 1110 07/26/17 0400  NA 135 135  K 5.1 3.9  CL 96* 98*  CO2 22 24  GLUCOSE 124* 121*  BUN 112* 58*  CREATININE 10.64* 7.06*  CALCIUM 9.2 8.3*    Recent Labs  Lab 07/25/17 1110 07/26/17 0400  AST 25 25  ALT 22 21  ALKPHOS 138* 131*  BILITOT 0.7 1.0  PROT 7.6 6.8  ALBUMIN 3.7 3.3*   Recent Labs  Lab 07/25/17 1110  LIPASE 42    Recent Labs  Lab 07/25/17 1110 07/26/17 0400  WBC 7.3 6.6  HGB 10.5*  9.7*  HCT 31.5* 29.0*  MCV 85.8 86.6  PLT 190 164    Recent Labs  Lab 07/26/17 0753 07/26/17 1115  GLUCAP 96 100*    Studies/Results: Dg Chest 2 View  Result Date: 07/25/2017 CLINICAL DATA:  Left anterior rib pain after fall times 1-1/2 weeks. Dyspnea. EXAM: CHEST  2 VIEW COMPARISON:  01/03/2017 CXR FINDINGS: Stable cardiomegaly with minimal aortic atherosclerosis at the arch. No aneurysm. Dialysis catheter tips are seen in the proximal and mid right atrium. Lungs are clear without pneumonic consolidation, effusion or pneumothorax. No acute fracture of the included ribs. No acute osseous abnormality of the dorsal spine. IMPRESSION: Dialysis catheter tips are seen within the right atrium. Stable cardiomegaly is noted with minimal aortic atherosclerosis. No acute fracture of the included ribs and dorsal spine. Electronically Signed   By: Ashley Royalty M.D.   On: 07/25/2017 15:00   Medications: . sodium chloride     . amLODipine  10 mg Oral Daily  . aspirin EC  81 mg Oral QHS  . carvedilol  25 mg Oral BID WC  . docusate sodium  100 mg Oral QHS  . heparin  5,000 Units Subcutaneous Q8H  .  hydrALAZINE  25 mg Oral TID  . hydrOXYzine  25 mg Oral TID  . insulin aspart  0-5 Units Subcutaneous QHS  . insulin aspart  0-9 Units Subcutaneous TID WC  . isosorbide mononitrate  30 mg Oral Daily  . pantoprazole  40 mg Oral Daily  . sodium chloride flush  3 mL Intravenous Q12H  . tamsulosin  0.4 mg Oral Daily  . zolpidem  10 mg Oral QHS   Dialysis Orders:  [place]  MWF 4 hr 180 NRe  400/800 87 kg  2.0 K/2.0 Ca -No heparin -Mircera 75 mcg IV q 2 week (recently resumed not given yet) -Venofer 100 mg IV X 10 (5/5 doses given-last dose 07/17/17 Fe 52 Tsat 21% 07/03/17) -Hectoral 1 mcg IV TIW (Last PTH 18!) BMD meds: Calcium acetate 667 mg PO TID AC (last Phos 3.2 Ca 10.4 07/03/17)  Assessment/Plan: 1.  Generalized anascara/volume overload 2/2 non-compliance with HD: Serial HD for volume  removal. Net 3.3 L off last night, for HD again today. 95.1 kg post HD with EDW 87. Still 8 kg up.  2. L Rib pain: Fall last Tuesday - no evidence of rib fx on Xray. Requiring pain meds (no h/o drug seeking so pain real). With decline in Hb and continued severe pain 1 week post fall will check CTA to exclude intraabd hematoma 3. ESRD -  MWF HD again today, then on Friday to get back on schedule.  4. Anxiety and depression - interfering significantly with pt's ability to complete a dialysis and is spilling over into home with agitation, anger - wife very frustrated, pt equally so. Is on hydroxyzine for anxiety by his primary care (I changed from prn to standing tid). I personally think would benefit from antidepressant, and even psychiatric outpt care. Please consider addition of antidepressant. 5. Hypertension/volume  - Please see problem # 1. BP with reasonable control. Home meds- carvedilol 25 mg PO BID, amlodipine 10 mg PO q HS.  Stopped lasix (insig residual renal fx) With soft BP's and need for UF will reduce amlodipine to 5 mg hs. 6.  Anemia  - HGB 10.5 No recent ESA doses. Cont Fe load from OP center. Aranesp with HD today 7.  Metabolic bone disease - Continue binders when able to eat consistently, holding hectorol (PTH 18 outpt)  8. Nutrition - renal carb mod diet 9. DM: per primary  Jamal Maes, MD [facility] [phone] pager 07/26/2017, 11:24 AM

## 2017-07-26 NOTE — Progress Notes (Signed)
Notified by central telemetry 9 beat run of V Tach. Pt asymptomatic. Will continue to monitor patient closely

## 2017-07-27 LAB — GLUCOSE, CAPILLARY
Glucose-Capillary: 146 mg/dL — ABNORMAL HIGH (ref 65–99)
Glucose-Capillary: 188 mg/dL — ABNORMAL HIGH (ref 65–99)
Glucose-Capillary: 134 mg/dL — ABNORMAL HIGH (ref 65–99)

## 2017-07-27 MED ORDER — DIPHENHYDRAMINE-ZINC ACETATE 2-0.1 % EX CREA
TOPICAL_CREAM | Freq: Two times a day (BID) | CUTANEOUS | Status: DC | PRN
  Filled 2017-07-27: qty 28

## 2017-07-27 MED ORDER — DARBEPOETIN ALFA 100 MCG/0.5ML IJ SOSY
PREFILLED_SYRINGE | INTRAMUSCULAR | Status: AC
  Filled 2017-07-27: qty 0.5

## 2017-07-27 MED ORDER — DIPHENHYDRAMINE HCL 25 MG PO CAPS
25.0000 mg | ORAL_CAPSULE | Freq: Once | ORAL | Status: AC
  Administered 2017-07-27: 25 mg via ORAL
  Filled 2017-07-27: qty 1

## 2017-07-27 MED ORDER — MORPHINE SULFATE (PF) 4 MG/ML IV SOLN
INTRAVENOUS | Status: AC
Start: 2017-07-27 — End: 2017-07-27
  Filled 2017-07-27: qty 1

## 2017-07-27 NOTE — Progress Notes (Signed)
HD tx completed @ 0313 w/o problem, UF goal met, blood rinsed back, VSS, report called to Theora Gianotti, RN

## 2017-07-27 NOTE — Progress Notes (Signed)
[facility] Rounding Note  Subjective/Interval History:  Refused Psychiatry eval yesterday (as I reminded his wife we talked about this when he was in the ED as needs help with anxiety and depression, recent anger issues that are keeping him from completing dialysis) Had HD yesterday - again wanted to sign off early as has been case when he does come to outpt HD.    Objective Vital signs in last 24 hours: Vitals:   07/27/17 0313 07/27/17 0320 07/27/17 0421 07/27/17 0848  BP: 128/88 139/84 126/81 (!) 130/91  Pulse: 78 79 79   Resp: _0 Temp:  97.6 F (36.4 C) 98 F (36.7 C)   TempSrc:  Oral Oral   SpO2: 100% 100% 98%   Weight:  91.4 kg (201 lb 8 oz) 91.5 kg (201 lb 12.8 oz)   Height:       Weight change: -0.174 kg (-6.1 oz)  Intake/Output Summary (Last 24 hours) at 07/27/2017 1051 Last data filed at 07/27/2017 0400 Gross per 24 hour  Intake 950 ml  Output 3850 ml  Net -2900 ml   Net UF 3.5 with HD yesterday 94.1->91.5 (EDW 87)  Physical Exam:  Blood pressure (!) 130/91, pulse 79, temperature 98 F (36.7 C), temperature source Oral, resp. rate 15, height 6' (1.829 m), weight 91.5 kg (201 lb 12.8 oz), SpO2 98 %.  Chronically ill appearing male in NAD. Wife in the room with him (and I spoke with her outside the room at some length) Still with JVD Bilateral breath sounds decreased in bases with few bibasilar crackles. No WOB.  RRR with S1 S2.  No murmurs, rubs, or gallops appreciated. Abdomen: A little softer, less tight than yesterday. Tender left side.   2+ pitting LE edema to hips Depressed affect, little eye contact Dialysis Access: RIJ TDC Drsg intact.  L upper arm AVF good bruit   Recent Labs  Lab 07/25/17 1110 07/26/17 0400  NA 135 135  K 5.1 3.9  CL 96* 98*  CO2 22 24  GLUCOSE 124* 121*  BUN 112* 58*  CREATININE 10.64* 7.06*  CALCIUM 9.2 8.3*    Recent Labs  Lab 07/25/17 1110 07/26/17 0400  AST 25 25  ALT 22 21  ALKPHOS 138* 131*  BILITOT 0.7 1.0   PROT 7.6 6.8  ALBUMIN 3.7 3.3*   Recent Labs  Lab 07/25/17 1110  LIPASE 42    Recent Labs  Lab 07/25/17 1110 07/26/17 0400  WBC 7.3 6.6  HGB 10.5* 9.7*  HCT 31.5* 29.0*  MCV 85.8 86.6  PLT 190 164    Recent Labs  Lab 07/26/17 0753 07/26/17 1115 07/26/17 1643 07/26/17 2113 07/27/17 0735  GLUCAP 96 100* 128* 104* 146*    Studies/Results: Ct Abdomen Pelvis Wo Contrast  Result Date: 07/26/2017 CLINICAL DATA:  Vomiting, recent fall, pain with deep inspiration, increased fluid in the abdomen and 1 8. Left rib pain. Initial encounter. Worsening in severe left-sided abdominal pain. EXAM: CT ABDOMEN AND PELVIS WITHOUT CONTRAST TECHNIQUE: Multidetector CT imaging of the abdomen and pelvis was performed following the standard protocol without IV contrast. COMPARISON:  03/05/2014. FINDINGS: Lower chest: Small right pleural effusion. Heart is enlarged. Decreased attenuation of the intravascular compartment is indicative of anemia. No pericardial effusion. Hepatobiliary: There is streak artifact throughout the posterior abdomen due to the patient's arms. This limits diagnostic quality. Liver and gallbladder are grossly unremarkable. No definite biliary ductal dilatation. Pancreas: Negative. Spleen: Partially obscured by streak artifact from the patient's  arms. Grossly unremarkable. Adrenals/Urinary Tract: Adrenal glands are unremarkable. Kidneys are poorly evaluated due to streak artifact from the patient's arms as well as lack of post-contrast imaging. Kidneys are grossly unremarkable. Bladder is low in volume. Stomach/Bowel: Stomach, small bowel and colon are grossly unremarkable. Vascular/Lymphatic: Left renal artery and bilateral common iliac artery calcification. Inguinal lymph nodes measure up to 13 mm on the left. Borderline enlarged abdominal retroperitoneal lymph nodes, up to 12 mm in the left periaortic station. Reproductive: Prostate is visualized. Other: Large ascites. No definite  high attenuation to suggest hemorrhage. Diffuse body wall edema. Presacral edema. Musculoskeletal: No fracture. IMPRESSION: 1. Exam quality is limited by lack of post-contrast imaging and streak artifact through the posterior abdomen from the patient's arms. No definite evidence of acute trauma. 2. Large ascites, diffuse body wall edema and small right pleural effusion. Electronically Signed   By: Lorin Picket M.D.   On: 07/26/2017 14:43   Dg Chest 2 View  Result Date: 07/25/2017 CLINICAL DATA:  Left anterior rib pain after fall times 1-1/2 weeks. Dyspnea. EXAM: CHEST  2 VIEW COMPARISON:  01/03/2017 CXR FINDINGS: Stable cardiomegaly with minimal aortic atherosclerosis at the arch. No aneurysm. Dialysis catheter tips are seen in the proximal and mid right atrium. Lungs are clear without pneumonic consolidation, effusion or pneumothorax. No acute fracture of the included ribs. No acute osseous abnormality of the dorsal spine. IMPRESSION: Dialysis catheter tips are seen within the right atrium. Stable cardiomegaly is noted with minimal aortic atherosclerosis. No acute fracture of the included ribs and dorsal spine. Electronically Signed   By: Ashley Royalty M.D.   On: 07/25/2017 15:00   Medications: . sodium chloride    . ferric gluconate (FERRLECIT/NULECIT) IV Stopped (07/27/17 0145)   . amLODipine  5 mg Oral QHS  . aspirin EC  81 mg Oral QHS  . carvedilol  25 mg Oral BID WC  . darbepoetin (ARANESP) injection - DIALYSIS  100 mcg Intravenous Q Wed-HD  . docusate sodium  100 mg Oral QHS  . heparin  5,000 Units Subcutaneous Q8H  . hydrALAZINE  25 mg Oral TID  . hydrOXYzine  25 mg Oral TID  . insulin aspart  0-5 Units Subcutaneous QHS  . insulin aspart  0-9 Units Subcutaneous TID WC  . isosorbide mononitrate  30 mg Oral Daily  . morphine      . pantoprazole  40 mg Oral Daily  . sodium chloride flush  3 mL Intravenous Q12H  . tamsulosin  0.4 mg Oral Daily  . zolpidem  10 mg Oral QHS   Dialysis  Orders:  [place]  MWF 4 hr 180 NRe  400/800 87 kg  2.0 K/2.0 Ca -No heparin -Mircera 75 mcg IV q 2 week (recently resumed not given yet) -Venofer 100 mg IV X 10 (5/5 doses given-last dose 07/17/17 Fe 52 Tsat 21% 07/03/17) -Hectoral 1 mcg IV TIW (Last PTH 18!) BMD meds: Calcium acetate 667 mg PO TID AC (last Phos 3.2 Ca 10.4 07/03/17)  Assessment/Plan: 1.  Generalized anascara/volume overload 2/2 non-compliance with HD: Serial HD for volume removal. Weight down after 2 HD treatments 95.1-->91.4; EDW 87 probably lower. Ascites on CT marked. 2.  L Rib pain: Fall last Tuesday - no evidence of rib fx on Xray. CT neg for abd hematoma (+ for marked ascites, abd wall edema) 3. ESRD -  MWF Next HD tomorrow 4. Anxiety and depression - interfering significantly with pt's ability to complete a dialysis and is spilling over  into home with agitation, anger - wife very frustrated, pt equally so. Is on hydroxyzine for anxiety by his primary care (I changed from prn to standing tid). I personally think would benefit from antidepressant, and even psychiatric outpt care. Unfortunately he declined consultation with psychiatry and I reminded his wife that we talked about this in the ED when he was admitted - unfortunate, but this resistance is being met with most suggestions  5.  Anemia  - No recent ESA doses. Cont Fe load from OP center. Aranesp with HD  6.  Metabolic bone disease - Continue binders when able to eat consistently, holding hectorol (PTH 18 outpt)  7. Nutrition - renal carb mod diet 8. DM: per primary   Jamal Maes, MD [facility] [phone] pager 07/27/2017, 10:51 AM

## 2017-07-27 NOTE — Progress Notes (Signed)
HD tx initiated via HD cath w/ lines reversed, AP no pull at all, push/flush ok, VP pull/push/flush ok, VSS, will cont to monitor while on HD tx

## 2017-07-27 NOTE — Plan of Care (Signed)
Pt consumes 75-100% of meals. Endorses good appetite

## 2017-07-27 NOTE — Progress Notes (Signed)
PROGRESS NOTE    James Burke  DJM:[id number] DOB: 1965/10/04 DOA: 07/25/2017 PCP: [name], MD    Brief Narrative: James Burke is a 51 y.o. male with medical history significant of C. difficile colitis, CHF, diabetes, DVT, ESRD on dialysis, GERD.  Patient presents after being told by his dialysis center to come to the hospital due to missing dialysis for more than a week. Patient endorses missing his dialysis sessions due to intermittent episodes of nausea and vomiting with eating making her feel too weak to be able to go to dialysis. Patient typically dialyzes on Monday Wednesday and Friday. Patient denies abdominal pain, diarrhea, fevers, flank pain, neck stiffness, headache, focal neurological deficits, rash, palpitations, shortness of breath. Symptoms of nausea vomiting and food intolerance are improving without intervention.  L rib cage pain: Patient reports falling approximately 7 days ago. Mechanical fall. Pain throughout left lower rib area since that time. Constant. Worse with. Improves with home opioids. Denies palpitations, substernal chest pain with associated nausea and shortness of breath or radiation. Some relief also with icing area.     Assessment & Plan:   Active Problems:   Controlled type 2 diabetes with renal manifestation (HCC)   Chronic systolic CHF (congestive heart failure), NYHA class 1 (HCC)   Essential hypertension, benign   Charcot foot due to diabetes mellitus ([place])   ESRD (end stage renal disease) (HCC)   Chest pain   Rib pain  1-ESRD; volume overload;  Had HD again 11-21  Appreciate nephrology help.  Still with significant amount of volume overload.   Depression, anxiety;  Patient agree to speak with psych, to discussed medications options to help with anxiety during HD> .   2-chest wall, ribs pain.  X ary negative for fracture.  Pain management.  CT abdomen negative for hematoma, has massive ascites.   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 No evidence of acute decompensation. - Continue beta blocker -I/O, daily weights  N/V:  - Zofran PRN - suspect related to uremia.   DM: on trulicity. Last injection 07/22/17 - SSI refuse insulin coverage.   GERD: - continue PPI     DVT prophylaxis: Heparin Code Status: Full code.  Family Communication: wife at bedside.  Disposition Plan: to be determine   Consultants:   Nephrology    Procedures: HD   Antimicrobials: none   Subjective: Still complaining of right side ribs pain.  Complaint of itching.  He is now willing to speak with psych regarding medications options to help with anxiety during HD>   Objective: Vitals:   07/27/17 0421 07/27/17 0848 07/27/17 1300 07/27/17 1527  BP: 126/81 (!) 130/91 131/87 132/82  Pulse: 79     Resp: 15     Temp: 98 F (36.7 C)  98.5 F (36.9 C)   TempSrc: Oral  Oral   SpO2: 98%  98%   Weight: 91.5 kg (201 lb 12.8 oz)     Height:        Intake/Output Summary (Last 24 hours) at 07/27/2017 1536 Last data filed at 07/27/2017 0900 Gross per 24 hour  Intake 790 ml  Output 3550 ml  Net -2760 ml   Filed Weights   07/26/17 2303 07/27/17 0320 07/27/17 0421  Weight: 94.9 kg (209 lb 3.5 oz) 91.4 kg (201 lb 8 oz) 91.5 kg (201 lb 12.8 oz)  Examination:  General exam: NAD Respiratory system: Normal respiratory effort, bilateral crackles.  Cardiovascular system: S 1, S 2 RRR, plus 2 edema Gastrointestinal system: bs present,. nt Central nervous system: Alert and oriented. No focal neurological deficits. Extremities: Symmetric 5 x 5 power.    Data Reviewed: I have personally reviewed following labs and imaging studies  CBC: Recent Labs  Lab 07/25/17 1110 07/26/17 0400  WBC 7.3 6.6  HGB 10.5* 9.7*  HCT 31.5* 29.0*  MCV 85.8 86.6  PLT 190 099    Basic Metabolic Panel: Recent Labs  Lab 07/25/17 1110 07/26/17 0400  NA 135 135  K 5.1 3.9  CL 96* 98*  CO2 22 24  GLUCOSE 124* 121*  BUN 112* 58*  CREATININE 10.64* 7.06*  CALCIUM 9.2 8.3*   GFR: Estimated Creatinine Clearance: 13.9 mL/min (A) (by C-G formula based on SCr of 7.06 mg/dL (H)). Liver Function Tests: Recent Labs  Lab 07/25/17 1110 07/26/17 0400  AST 25 25  ALT 22 21  ALKPHOS 138* 131*  BILITOT 0.7 1.0  PROT 7.6 6.8  ALBUMIN 3.7 3.3*   Recent Labs  Lab 07/25/17 1110  LIPASE 42   No results for input(s): AMMONIA in the last 168 hours. Coagulation Profile: No results for input(s): INR, PROTIME in the last 168 hours. Cardiac Enzymes: No results for input(s): CKTOTAL, CKMB, CKMBINDEX, TROPONINI in the last 168 hours. BNP (last 3 results) No results for input(s): PROBNP in the last 8760 hours. HbA1C: No results for input(s): HGBA1C in the last 72 hours. CBG: Recent Labs  Lab 07/26/17 1115 07/26/17 1643 07/26/17 2113 07/27/17 0735 07/27/17 1132  GLUCAP 100* 128* 104* 146* 188*   Lipid Profile: No results for input(s): CHOL, HDL, LDLCALC, TRIG, CHOLHDL, LDLDIRECT in the last 72 hours. Thyroid Function Tests: No results for input(s): TSH, T4TOTAL, FREET4, T3FREE, THYROIDAB in the last 72 hours. Anemia Panel: No results for input(s): VITAMINB12, FOLATE, FERRITIN, TIBC, IRON, RETICCTPCT in the last 72 hours. Sepsis Labs: No results for input(s): PROCALCITON, LATICACIDVEN in the last 168 hours.  No results found for this or any previous visit (from the past 240 hour(s)).       Radiology Studies: Ct Abdomen Pelvis Wo Contrast  Result Date: 07/26/2017 CLINICAL DATA:  Vomiting, recent fall, pain with deep inspiration, increased fluid in the abdomen and 1 8. Left rib pain. Initial encounter. Worsening in severe left-sided abdominal pain. EXAM: CT ABDOMEN AND PELVIS WITHOUT CONTRAST TECHNIQUE: Multidetector CT imaging of the abdomen and pelvis  was performed following the standard protocol without IV contrast. COMPARISON:  03/05/2014. FINDINGS: Lower chest: Small right pleural effusion. Heart is enlarged. Decreased attenuation of the intravascular compartment is indicative of anemia. No pericardial effusion. Hepatobiliary: There is streak artifact throughout the posterior abdomen due to the patient's arms. This limits diagnostic quality. Liver and gallbladder are grossly unremarkable. No definite biliary ductal dilatation. Pancreas: Negative. Spleen: Partially obscured by streak artifact from the patient's arms. Grossly unremarkable. Adrenals/Urinary Tract: Adrenal glands are unremarkable. Kidneys are poorly evaluated due to streak artifact from the patient's arms as well as lack of post-contrast imaging. Kidneys are grossly unremarkable. Bladder is low in volume. Stomach/Bowel: Stomach, small bowel and colon are grossly unremarkable. Vascular/Lymphatic: Left renal artery and bilateral common iliac artery calcification. Inguinal lymph nodes measure up to 13 mm on the left. Borderline enlarged abdominal retroperitoneal lymph nodes, up to 12 mm in the left periaortic station. Reproductive: Prostate is visualized. Other: Large ascites. No definite high attenuation to suggest  hemorrhage. Diffuse body wall edema. Presacral edema. Musculoskeletal: No fracture. IMPRESSION: 1. Exam quality is limited by lack of post-contrast imaging and streak artifact through the posterior abdomen from the patient's arms. No definite evidence of acute trauma. 2. Large ascites, diffuse body wall edema and small right pleural effusion. Electronically Signed   By: Lorin Picket M.D.   On: 07/26/2017 14:43        Scheduled Meds: . amLODipine  5 mg Oral QHS  . aspirin EC  81 mg Oral QHS  . carvedilol  25 mg Oral BID WC  . darbepoetin (ARANESP) injection - DIALYSIS  100 mcg Intravenous Q Wed-HD  . docusate sodium  100 mg Oral QHS  . heparin  5,000 Units Subcutaneous Q8H   . hydrALAZINE  25 mg Oral TID  . hydrOXYzine  25 mg Oral TID  . insulin aspart  0-5 Units Subcutaneous QHS  . insulin aspart  0-9 Units Subcutaneous TID WC  . isosorbide mononitrate  30 mg Oral Daily  . pantoprazole  40 mg Oral Daily  . sodium chloride flush  3 mL Intravenous Q12H  . tamsulosin  0.4 mg Oral Daily  . zolpidem  10 mg Oral QHS   Continuous Infusions: . sodium chloride    . ferric gluconate (FERRLECIT/NULECIT) IV Stopped (07/27/17 0145)     LOS: 2 days    Time spent: 35 minutes.     Elmarie Shiley, MD [facility] Pager [phone]  If 7PM-7AM, please contact night-coverage [email or web address] Password [facility] 07/27/2017, 3:36 PM

## 2017-07-28 DIAGNOSIS — N186 End stage renal disease: Secondary | ICD-10-CM

## 2017-07-28 LAB — CBC
HCT: 31.3 % — ABNORMAL LOW (ref 39.0–52.0)
Hemoglobin: 10.3 g/dL — ABNORMAL LOW (ref 13.0–17.0)
MCH: 28.5 pg (ref 26.0–34.0)
MCHC: 32.9 g/dL (ref 30.0–36.0)
MCV: 86.7 fL (ref 78.0–100.0)
Platelets: 168 10*3/uL (ref 150–400)
RBC: 3.61 MIL/uL — ABNORMAL LOW (ref 4.22–5.81)
RDW: 17.8 % — ABNORMAL HIGH (ref 11.5–15.5)
WBC: 5.6 10*3/uL (ref 4.0–10.5)

## 2017-07-28 LAB — GLUCOSE, CAPILLARY
Glucose-Capillary: 123 mg/dL — ABNORMAL HIGH (ref 65–99)
Glucose-Capillary: 116 mg/dL — ABNORMAL HIGH (ref 65–99)
Glucose-Capillary: 169 mg/dL — ABNORMAL HIGH (ref 65–99)
Glucose-Capillary: 119 mg/dL — ABNORMAL HIGH (ref 65–99)

## 2017-07-28 LAB — RENAL FUNCTION PANEL
Albumin: 3.1 g/dL — ABNORMAL LOW (ref 3.5–5.0)
Anion gap: 11 (ref 5–15)
BUN: 45 mg/dL — ABNORMAL HIGH (ref 6–20)
CO2: 24 mmol/L (ref 22–32)
Calcium: 8 mg/dL — ABNORMAL LOW (ref 8.9–10.3)
Chloride: 97 mmol/L — ABNORMAL LOW (ref 101–111)
Creatinine, Ser: 6.09 mg/dL — ABNORMAL HIGH (ref 0.61–1.24)
GFR calc Af Amer: 11 mL/min — ABNORMAL LOW (ref >60)
GFR calc non Af Amer: 10 mL/min — ABNORMAL LOW (ref >60)
Glucose, Bld: 155 mg/dL — ABNORMAL HIGH (ref 65–99)
Phosphorus: 2.7 mg/dL (ref 2.5–4.6)
Potassium: 4.5 mmol/L (ref 3.5–5.1)
Sodium: 132 mmol/L — ABNORMAL LOW (ref 135–145)

## 2017-07-28 MED ORDER — LORAZEPAM 2 MG/ML IJ SOLN
1.0000 mg | Freq: Every day | INTRAMUSCULAR | Status: DC | PRN
  Administered 2017-07-29: 1 mg via INTRAVENOUS
  Filled 2017-07-28: qty 1

## 2017-07-28 MED ORDER — OXYCODONE HCL 5 MG PO TABS
ORAL_TABLET | ORAL | Status: AC
  Administered 2017-07-28: 10 mg via ORAL
  Filled 2017-07-28: qty 2

## 2017-07-28 MED ORDER — DARBEPOETIN ALFA 100 MCG/0.5ML IJ SOSY
PREFILLED_SYRINGE | INTRAMUSCULAR | Status: AC
  Administered 2017-07-28: 100 ug via INTRAVENOUS
  Filled 2017-07-28: qty 0.5

## 2017-07-28 MED ORDER — DARBEPOETIN ALFA 100 MCG/0.5ML IJ SOSY
100.0000 ug | PREFILLED_SYRINGE | INTRAMUSCULAR | Status: DC
  Administered 2017-07-28: 100 ug via INTRAVENOUS

## 2017-07-28 NOTE — Progress Notes (Signed)
Went to patient's room and informed patient I will be his nurse for the next 12 hours. Informed patient I was informed in shift report that he is a high risk for fall and per protocol he will need his bed alarm turned on. Patient refused stating " I am not going to do any jumping jacks here" Falls education provided and charge nurse updated

## 2017-07-28 NOTE — Procedures (Signed)
I have personally attended this patient's dialysis session.   2K pending labs TDC UF goal 4 liters Pre weight [place], MD [facility] [phone] Pager 07/28/2017, 9:00 AM

## 2017-07-28 NOTE — Plan of Care (Signed)
Pt continues to c/o left rib pain, relieved w/alternating morphine and oxycodone. SR on monitor, no acute issues this shift.

## 2017-07-28 NOTE — Progress Notes (Signed)
PROGRESS NOTE    James Burke  WGN:[id number] DOB: 1966/06/08 DOA: 07/25/2017 PCP: [name], MD    Brief Narrative: James Burke is a 51 y.o. male with medical history significant of C. difficile colitis, CHF, diabetes, DVT, ESRD on dialysis, GERD.  Patient presents after being told by his dialysis center to come to the hospital due to missing dialysis for more than a week. Patient endorses missing his dialysis sessions due to intermittent episodes of nausea and vomiting with eating making her feel too weak to be able to go to dialysis. Patient typically dialyzes on Monday Wednesday and Friday. Patient denies abdominal pain, diarrhea, fevers, flank pain, neck stiffness, headache, focal neurological deficits, rash, palpitations, shortness of breath. Symptoms of nausea vomiting and food intolerance are improving without intervention.  L rib cage pain: Patient reports falling approximately 7 days ago. Mechanical fall. Pain throughout left lower rib area since that time. Constant. Worse with. Improves with home opioids. Denies palpitations, substernal chest pain with associated nausea and shortness of breath or radiation. Some relief also with icing area.  Assessment & Plan:   Principal Problem:   ESRD (end stage renal disease) ([place]) Active Problems:   Controlled type 2 diabetes with renal manifestation (HCC)   Chronic systolic CHF (congestive heart failure), NYHA class 1 (HCC)   Essential hypertension, benign   Charcot foot due to diabetes mellitus (HCC)   Chest pain   Rib pain  1-ESRD; volume overload;  Had HD again 11-21, 11-23-. Plan for dialysis 11-24 Appreciate nephrology help.  Still with significant amount of volume overload.   Depression, anxiety;  Patient agree to speak with psych, to discussed medications options to help with anxiety during HD> .   2-chest wall, ribs pain.  X ary negative for fracture.  Pain management.  CT abdomen negative for  hematoma, has massive ascites.   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 No evidence of acute decompensation. - Continue beta blocker -I/O, daily weights  Massive ascites; discussed with Dr Lorrene Reid, will ask fro US paracentesis. Therapeutic.   N/V:  - Zofran PRN - suspect related to uremia.   DM: on trulicity. Last injection 07/22/17 - SSI refuse insulin coverage.   GERD: - continue PPI     DVT prophylaxis: Heparin Code Status: Full code.  Family Communication: wife at bedside.  Disposition Plan: to be determine   Consultants:   Nephrology    Procedures: HD   Antimicrobials: none   Subjective: Patient seen at HD. He was calm. He is willing to speak with psych.  Agree to proceed with paracentesis.   Objective: Vitals:   07/28/17 1245 07/28/17 1252 07/28/17 1254 07/28/17 1300  BP: 135/82 110/71 127/76 (!) 121/96  Pulse: 77 (!) 56 78 82  Resp:   20 20  Temp:   97.8 F (36.6 C) 97.9 F (36.6 C)  TempSrc:   Oral Oral  SpO2:   99% 100%  Weight:   90 kg (198 lb 6.6 oz)   Height:        Intake/Output Summary (Last 24 hours) at 07/28/2017 1501 Last data filed at 07/28/2017 1254 Gross per 24 hour  Intake 120 ml  Output 4026 ml  Net -3906 ml   Filed Weights   07/28/17 0608 07/28/17 0834 07/28/17 1254  Weight: 93 kg (205 lb 0.4 oz) 94 kg (207 lb 3.7 oz)  90 kg (198 lb 6.6 oz)    Examination:  General exam: NAD Respiratory system: Normal respiratory effort, bilateral crackles.  Cardiovascular system: S 1, S 2 RRR, plus 2 edema Gastrointestinal system: distended, soft, nr Central nervous system: non focal.  Extremities: Symmetric 5 x 5 power.    Data Reviewed: I have personally reviewed following labs and imaging studies  CBC: Recent Labs  Lab 07/25/17 1110 07/26/17 0400 07/28/17 0350  WBC 7.3 6.6 5.6    HGB 10.5* 9.7* 10.3*  HCT 31.5* 29.0* 31.3*  MCV 85.8 86.6 86.7  PLT 190 164 412   Basic Metabolic Panel: Recent Labs  Lab 07/25/17 1110 07/26/17 0400 07/28/17 0844  NA 135 135 132*  K 5.1 3.9 4.5  CL 96* 98* 97*  CO2 22 24 24   GLUCOSE 124* 121* 155*  BUN 112* 58* 45*  CREATININE 10.64* 7.06* 6.09*  CALCIUM 9.2 8.3* 8.0*  PHOS  --   --  2.7   GFR: Estimated Creatinine Clearance: 16.1 mL/min (A) (by C-G formula based on SCr of 6.09 mg/dL (H)). Liver Function Tests: Recent Labs  Lab 07/25/17 1110 07/26/17 0400 07/28/17 0844  AST 25 25  --   ALT 22 21  --   ALKPHOS 138* 131*  --   BILITOT 0.7 1.0  --   PROT 7.6 6.8  --   ALBUMIN 3.7 3.3* 3.1*   Recent Labs  Lab 07/25/17 1110  LIPASE 42   No results for input(s): AMMONIA in the last 168 hours. Coagulation Profile: No results for input(s): INR, PROTIME in the last 168 hours. Cardiac Enzymes: No results for input(s): CKTOTAL, CKMB, CKMBINDEX, TROPONINI in the last 168 hours. BNP (last 3 results) No results for input(s): PROBNP in the last 8760 hours. HbA1C: No results for input(s): HGBA1C in the last 72 hours. CBG: Recent Labs  Lab 07/27/17 0735 07/27/17 1132 07/27/17 2129 07/28/17 0800 07/28/17 1350  GLUCAP 146* 188* 134* 123* 116*   Lipid Profile: No results for input(s): CHOL, HDL, LDLCALC, TRIG, CHOLHDL, LDLDIRECT in the last 72 hours. Thyroid Function Tests: No results for input(s): TSH, T4TOTAL, FREET4, T3FREE, THYROIDAB in the last 72 hours. Anemia Panel: No results for input(s): VITAMINB12, FOLATE, FERRITIN, TIBC, IRON, RETICCTPCT in the last 72 hours. Sepsis Labs: No results for input(s): PROCALCITON, LATICACIDVEN in the last 168 hours.  No results found for this or any previous visit (from the past 240 hour(s)).       Radiology Studies: No results found.      Scheduled Meds: . amLODipine  5 mg Oral QHS  . aspirin EC  81 mg Oral QHS  . carvedilol  25 mg Oral BID WC  .  darbepoetin (ARANESP) injection - DIALYSIS  100 mcg Intravenous Q Fri-HD  . docusate sodium  100 mg Oral QHS  . heparin  5,000 Units Subcutaneous Q8H  . hydrALAZINE  25 mg Oral TID  . hydrOXYzine  25 mg Oral TID  . insulin aspart  0-5 Units Subcutaneous QHS  . insulin aspart  0-9 Units Subcutaneous TID WC  . isosorbide mononitrate  30 mg Oral Daily  . pantoprazole  40 mg Oral Daily  . sodium chloride flush  3 mL Intravenous Q12H  . tamsulosin  0.4 mg Oral Daily  . zolpidem  10 mg Oral QHS   Continuous Infusions: . sodium chloride    . ferric gluconate (FERRLECIT/NULECIT) IV 125 mg (07/28/17 1112)     LOS: 3 days    Time spent:  35 minutes.     Elmarie Shiley, MD [facility] Pager [phone]  If 7PM-7AM, please contact night-coverage [email or web address] Password [email or web address] 07/28/2017, 3:01 PM

## 2017-07-28 NOTE — Progress Notes (Addendum)
[facility] Rounding Note  Subjective/Interval History:  Refused Psychiatry eval but is now agreeable (to help with medication for anxiety/depression and inability to sit still) Wife I think should be there when Psyche sees Still very overloaded and in for HD today Side pain a little less Had  BM Pre HD weight 94 kg   Objective Vital signs in last 24 hours: Vitals:   07/27/17 1300 07/27/17 1527 07/27/17 2127 07/28/17 0608  BP: 131/87 132/82 130/81 126/81  Pulse:   74 75  Resp:   16 16  Temp: 98.5 F (36.9 C)  98 F (36.7 C) 98 F (36.7 C)  TempSrc: Oral  Oral Oral  SpO2: 98%  98% 98%  Weight:    93 kg (205 lb 0.4 oz)  Height:       Weight change: -1.9 kg (-3 oz)  Intake/Output Summary (Last 24 hours) at 07/28/2017 0849 Last data filed at 07/27/2017 1722 Gross per 24 hour  Intake 560 ml  Output -  Net 560 ml   Net UF 3.5 with HD yesterday 94.1->91.5 (EDW 87) Back to 84 today  Physical Exam:  Blood pressure 126/81, pulse 75, temperature 98 F (36.7 C), temperature source Oral, resp. rate 16, height 6' (1.829 m), weight 93 kg (205 lb 0.4 oz), SpO2 98 %.  Chronically ill appearing male in NAD. Looks more calm and comfortable Still with JVD Bilateral breath sounds decreased in bases with few bibasilar crackles. No WOB.  RRR with S1 S2.  No murmurs, rubs, or gallops appreciated. A little softer, less tight than yesterday.  Still with abd wall pitting edema   2+ pitting LE edema to hips Less depressed, more eye contact RIJ TDC Drsg intact.  L upper arm AVF good bruit   Recent Labs  Lab 07/25/17 1110 07/26/17 0400  NA 135 135  K 5.1 3.9  CL 96* 98*  CO2 22 24  GLUCOSE 124* 121*  BUN 112* 58*  CREATININE 10.64* 7.06*  CALCIUM 9.2 8.3*    Recent Labs  Lab 07/25/17 1110 07/26/17 0400  AST 25 25  ALT 22 21  ALKPHOS 138* 131*  BILITOT 0.7 1.0  PROT 7.6 6.8  ALBUMIN 3.7 3.3*   Recent Labs  Lab 07/25/17 1110  LIPASE 42    Recent Labs  Lab 07/25/17 1110  07/26/17 0400 07/28/17 0350  WBC 7.3 6.6 5.6  HGB 10.5* 9.7* 10.3*  HCT 31.5* 29.0* 31.3*  MCV 85.8 86.6 86.7  PLT 190 164 168    Recent Labs  Lab 07/26/17 2113 07/27/17 0735 07/27/17 1132 07/27/17 2129 07/28/17 0800  GLUCAP 104* 146* 188* 134* 123*    Studies/Results: Ct Abdomen Pelvis Wo Contrast  Result Date: 07/26/2017 CLINICAL DATA:  Vomiting, recent fall, pain with deep inspiration, increased fluid in the abdomen and 1 8. Left rib pain. Initial encounter. Worsening in severe left-sided abdominal pain. EXAM: CT ABDOMEN AND PELVIS WITHOUT CONTRAST TECHNIQUE: Multidetector CT imaging of the abdomen and pelvis was performed following the standard protocol without IV contrast. COMPARISON:  03/05/2014. FINDINGS: Lower chest: Small right pleural effusion. Heart is enlarged. Decreased attenuation of the intravascular compartment is indicative of anemia. No pericardial effusion. Hepatobiliary: There is streak artifact throughout the posterior abdomen due to the patient's arms. This limits diagnostic quality. Liver and gallbladder are grossly unremarkable. No definite biliary ductal dilatation. Pancreas: Negative. Spleen: Partially obscured by streak artifact from the patient's arms. Grossly unremarkable. Adrenals/Urinary Tract: Adrenal glands are unremarkable. Kidneys are poorly evaluated due to  streak artifact from the patient's arms as well as lack of post-contrast imaging. Kidneys are grossly unremarkable. Bladder is low in volume. Stomach/Bowel: Stomach, small bowel and colon are grossly unremarkable. Vascular/Lymphatic: Left renal artery and bilateral common iliac artery calcification. Inguinal lymph nodes measure up to 13 mm on the left. Borderline enlarged abdominal retroperitoneal lymph nodes, up to 12 mm in the left periaortic station. Reproductive: Prostate is visualized. Other: Large ascites. No definite high attenuation to suggest hemorrhage. Diffuse body wall edema. Presacral edema.  Musculoskeletal: No fracture. IMPRESSION: 1. Exam quality is limited by lack of post-contrast imaging and streak artifact through the posterior abdomen from the patient's arms. No definite evidence of acute trauma. 2. Large ascites, diffuse body wall edema and small right pleural effusion. Electronically Signed   By: Lorin Picket M.D.   On: 07/26/2017 14:43   Medications: . sodium chloride    . ferric gluconate (FERRLECIT/NULECIT) IV Stopped (07/27/17 0145)   . amLODipine  5 mg Oral QHS  . aspirin EC  81 mg Oral QHS  . carvedilol  25 mg Oral BID WC  . darbepoetin (ARANESP) injection - DIALYSIS  100 mcg Intravenous Q Wed-HD  . docusate sodium  100 mg Oral QHS  . heparin  5,000 Units Subcutaneous Q8H  . hydrALAZINE  25 mg Oral TID  . hydrOXYzine  25 mg Oral TID  . insulin aspart  0-5 Units Subcutaneous QHS  . insulin aspart  0-9 Units Subcutaneous TID WC  . isosorbide mononitrate  30 mg Oral Daily  . pantoprazole  40 mg Oral Daily  . sodium chloride flush  3 mL Intravenous Q12H  . tamsulosin  0.4 mg Oral Daily  . zolpidem  10 mg Oral QHS   Dialysis Orders:  [place] [facility]  MWF 4 hr 180 NRe  400/800 87 kg  2.0 K/2.0 Ca -No heparin -Mircera 75 mcg IV q 2 week (recently resumed not given yet) -Venofer 100 mg IV X 10 (5/5 doses given-last dose 07/17/17 Fe 52 Tsat 21% 07/03/17) -Hectoral 1 mcg IV TIW (Last PTH 18!) BMD meds: Calcium acetate 667 mg PO TID AC (last Phos 3.2 Ca 10.4 07/03/17)  Assessment/Plan: 1. ESRD -  MWF  1. Still using TDC 2. Need to start outpt with expert cannulator  2.  Generalized anascara/volume overload 2/2 non-compliance with HD:  1. Serial HD for volume removal.  1. Weight down after 2 HD treatments 95.1-->91.4;  2. EDW 87 probably lower.  3. Ascites on CT marked. 4. HD again today pre weight 94, and then again tomorrow 3.  L Rib pain: Fall last Tuesday - no evidence of rib fx on Xray.  1. CT neg for abd hematoma (+ for marked ascites, abd wall  edema) 4. Anxiety and depression - interfering significantly with pt's ability to complete a dialysis and is spilling over into home with agitation, anger - wife very frustrated, pt equally so. Is on hydroxyzine for anxiety by his primary care (I changed from prn to standing tid). I personally think would benefit from antidepressant, and even psychiatric outpt care. He declined initial consultation but is now agreeable for help with med management 5. Anemia  - Cont Fe load from OP center. Aranesp with HD - not given Wednesday as ordered that I can tell.  6.  Metabolic bone disease  1. Resume binders (ca acetate TID ac) 2. Holding hectorol (PTH 18 outpt)  7. Nutrition - renal carb mod diet 8. DM: per primary   Jamal Maes,  MD [place] pager 07/28/2017, 8:49 AM

## 2017-07-29 ENCOUNTER — Inpatient Hospital Stay (HOSPITAL_COMMUNITY): Payer: BLUE CROSS/BLUE SHIELD

## 2017-07-29 LAB — CBC
HCT: 32.9 % — ABNORMAL LOW (ref 39.0–52.0)
Hemoglobin: 10.9 g/dL — ABNORMAL LOW (ref 13.0–17.0)
MCH: 28.5 pg (ref 26.0–34.0)
MCHC: 33.1 g/dL (ref 30.0–36.0)
MCV: 85.9 fL (ref 78.0–100.0)
Platelets: 161 10*3/uL (ref 150–400)
RBC: 3.83 MIL/uL — ABNORMAL LOW (ref 4.22–5.81)
RDW: 17.4 % — ABNORMAL HIGH (ref 11.5–15.5)
WBC: 6 10*3/uL (ref 4.0–10.5)

## 2017-07-29 LAB — RENAL FUNCTION PANEL
Albumin: 3.2 g/dL — ABNORMAL LOW (ref 3.5–5.0)
Anion gap: 11 (ref 5–15)
BUN: 34 mg/dL — ABNORMAL HIGH (ref 6–20)
CO2: 25 mmol/L (ref 22–32)
Calcium: 8.1 mg/dL — ABNORMAL LOW (ref 8.9–10.3)
Chloride: 93 mmol/L — ABNORMAL LOW (ref 101–111)
Creatinine, Ser: 5.32 mg/dL — ABNORMAL HIGH (ref 0.61–1.24)
GFR calc Af Amer: 13 mL/min — ABNORMAL LOW (ref >60)
GFR calc non Af Amer: 11 mL/min — ABNORMAL LOW (ref >60)
Glucose, Bld: 185 mg/dL — ABNORMAL HIGH (ref 65–99)
Phosphorus: 2.4 mg/dL — ABNORMAL LOW (ref 2.5–4.6)
Potassium: 4.2 mmol/L (ref 3.5–5.1)
Sodium: 129 mmol/L — ABNORMAL LOW (ref 135–145)

## 2017-07-29 LAB — GLUCOSE, CAPILLARY
Glucose-Capillary: 124 mg/dL — ABNORMAL HIGH (ref 65–99)
Glucose-Capillary: 140 mg/dL — ABNORMAL HIGH (ref 65–99)
Glucose-Capillary: 166 mg/dL — ABNORMAL HIGH (ref 65–99)
Glucose-Capillary: 149 mg/dL — ABNORMAL HIGH (ref 65–99)

## 2017-07-29 MED ORDER — ACETAMINOPHEN 500 MG PO TABS
ORAL_TABLET | ORAL | Status: AC
  Administered 2017-07-29: 1000 mg via ORAL
  Filled 2017-07-29: qty 2

## 2017-07-29 MED ORDER — LORAZEPAM 0.5 MG PO TABS
ORAL_TABLET | ORAL | Status: AC
  Administered 2017-07-29: 1 mg via ORAL
  Filled 2017-07-29: qty 2

## 2017-07-29 MED ORDER — DARBEPOETIN ALFA 100 MCG/0.5ML IJ SOSY: 100.0000 ug | PREFILLED_SYRINGE | Freq: Once | INTRAMUSCULAR | Status: DC

## 2017-07-29 MED ORDER — OXYCODONE HCL 5 MG PO TABS
ORAL_TABLET | ORAL | Status: AC
  Administered 2017-07-29: 10 mg via ORAL
  Filled 2017-07-29: qty 2

## 2017-07-29 MED ORDER — LORAZEPAM 0.5 MG PO TABS
1.0000 mg | ORAL_TABLET | Freq: Once | ORAL | Status: AC
  Administered 2017-07-29: 1 mg via ORAL

## 2017-07-29 MED ORDER — HYDROXYZINE HCL 25 MG PO TABS
ORAL_TABLET | ORAL | Status: AC
  Administered 2017-07-29: 25 mg via ORAL
  Filled 2017-07-29: qty 1

## 2017-07-29 MED ORDER — MORPHINE SULFATE (PF) 4 MG/ML IV SOLN
INTRAVENOUS | Status: AC
  Administered 2017-07-29: 4 mg via INTRAVENOUS
  Filled 2017-07-29: qty 1

## 2017-07-29 MED ORDER — LIDOCAINE HCL (PF) 1 % IJ SOLN
INTRAMUSCULAR | Status: AC
  Filled 2017-07-29: qty 30

## 2017-07-29 NOTE — Progress Notes (Signed)
Dialysis treatment completed.  4500 mL ultrafiltrated.  4000 mL net fluid removal.  Patient status unchanged. Lung sounds diminished to ausculation in all fields. BLE 3+ pitting edema. Cardiac: NSR.  Cleansed RIJ catheter with chlorhexidine.  Disconnected lines and flushed ports with saline per protocol.  Ports locked with heparin and capped per protocol.    Report given to bedside, RN Dilcia.

## 2017-07-29 NOTE — Progress Notes (Signed)
PROGRESS NOTE    [name]  NUU:[id number] DOB: Jul 11, 1966 DOA: 07/25/2017 PCP: [name], MD    Brief Narrative: James Burke is a 51 y.o. male with medical history significant of C. difficile colitis, CHF, diabetes, DVT, ESRD on dialysis, GERD.  Patient presents after being told by his dialysis center to come to the hospital due to missing dialysis for more than a week. Patient endorses missing his dialysis sessions due to intermittent episodes of nausea and vomiting with eating making her feel too weak to be able to go to dialysis. Patient typically dialyzes on Monday Wednesday and Friday. Patient denies abdominal pain, diarrhea, fevers, flank pain, neck stiffness, headache, focal neurological deficits, rash, palpitations, shortness of breath. Symptoms of nausea vomiting and food intolerance are improving without intervention.  L rib cage pain: Patient reports falling approximately 7 days ago. Mechanical fall. Pain throughout left lower rib area since that time. Constant. Worse with. Improves with home opioids. Denies palpitations, substernal chest pain with associated nausea and shortness of breath or radiation. Some relief also with icing area.  Assessment & Plan:   Principal Problem:   ESRD (end stage renal disease) (James Burke) Active Problems:   Controlled type 2 diabetes with renal manifestation (HCC)   Chronic systolic CHF (congestive heart failure), NYHA class 1 (HCC)   Essential hypertension, benign   Charcot foot due to diabetes mellitus (HCC)   Chest pain   Rib pain  1-ESRD; volume overload;  Had HD again 11-21, 11-23-. Plan for dialysis 11-24 Appreciate nephrology help.  Still with significant amount of volume overload.  Weight 209----198.  2-Depression, anxiety;  Patient agree to speak with psych, to discussed medications options to help with anxiety during HD> .   3-Chest wall, Ribs pain.  X ary negative for fracture.  Pain management.  CT  abdomen negative for hematoma, has massive ascites.   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 No evidence of acute decompensation. - Continue beta blocker -I/O, daily weights  6-Massive ascites; discussed with Dr Lorrene Reid, will ask fro US paracentesis. Therapeutic.  Will give ativan prior to procedure.   N/V:  - Zofran PRN - suspect related to uremia.   DM: on trulicity. Last injection 07/22/17 - SSI refuse insulin coverage.   GERD: - continue PPI     DVT prophylaxis: Heparin Code Status: Full code.  Family Communication: care discussed with patient.  Disposition Plan: to be determine   Consultants:   Nephrology    Procedures: HD   Antimicrobials: none   Subjective: Complaining of right side ribs pain.    Objective: Vitals:   07/28/17 1300 07/28/17 1600 07/28/17 2029 07/29/17 0500  BP: (!) 121/96 122/80 131/89 125/81  Pulse: 82  78 71  Resp: 20  18 18   Temp: 97.9 F (36.6 C)  98 F (36.7 C) 97.8 F (36.6 C)  TempSrc: Oral  Oral Oral  SpO2: 100%  100% 97%  Weight:      Height:        Intake/Output Summary (Last 24 hours) at 07/29/2017 1339 Last data filed at 07/29/2017 0700 Gross per 24 hour  Intake 720 ml  Output 150 ml  Net 570 ml   Filed Weights   07/28/17 0608 07/28/17 0834 07/28/17 1254  Weight: 93 kg (205 lb 0.4 oz) 94 kg (207 lb 3.7 oz) 90 kg (198 lb 6.6 oz)  Examination:  General exam: NNAD Respiratory system: Normal respiratory effort, Crackles bases.  Cardiovascular system: S 1, S 2 RRR, plus 2 edema Gastrointestinal system: Distended, NO rigidity  Central nervous system: non focal.  Extremities: symmetric power.     Data Reviewed: I have personally reviewed following labs and imaging studies  CBC: Recent Labs  Lab 07/25/17 1110 07/26/17 0400 07/28/17 0350  WBC 7.3 6.6 5.6    HGB 10.5* 9.7* 10.3*  HCT 31.5* 29.0* 31.3*  MCV 85.8 86.6 86.7  PLT 190 164 073   Basic Metabolic Panel: Recent Labs  Lab 07/25/17 1110 07/26/17 0400 07/28/17 0844  NA 135 135 132*  K 5.1 3.9 4.5  CL 96* 98* 97*  CO2 22 24 24   GLUCOSE 124* 121* 155*  BUN 112* 58* 45*  CREATININE 10.64* 7.06* 6.09*  CALCIUM 9.2 8.3* 8.0*  PHOS  --   --  2.7   GFR: Estimated Creatinine Clearance: 16.1 mL/min (A) (by C-G formula based on SCr of 6.09 mg/dL (H)). Liver Function Tests: Recent Labs  Lab 07/25/17 1110 07/26/17 0400 07/28/17 0844  AST 25 25  --   ALT 22 21  --   ALKPHOS 138* 131*  --   BILITOT 0.7 1.0  --   PROT 7.6 6.8  --   ALBUMIN 3.7 3.3* 3.1*   Recent Labs  Lab 07/25/17 1110  LIPASE 42   No results for input(s): AMMONIA in the last 168 hours. Coagulation Profile: No results for input(s): INR, PROTIME in the last 168 hours. Cardiac Enzymes: No results for input(s): CKTOTAL, CKMB, CKMBINDEX, TROPONINI in the last 168 hours. BNP (last 3 results) No results for input(s): PROBNP in the last 8760 hours. HbA1C: No results for input(s): HGBA1C in the last 72 hours. CBG: Recent Labs  Lab 07/28/17 1350 07/28/17 1805 07/28/17 2055 07/29/17 0750 07/29/17 1213  GLUCAP 116* 169* 119* 124* 140*   Lipid Profile: No results for input(s): CHOL, HDL, LDLCALC, TRIG, CHOLHDL, LDLDIRECT in the last 72 hours. Thyroid Function Tests: No results for input(s): TSH, T4TOTAL, FREET4, T3FREE, THYROIDAB in the last 72 hours. Anemia Panel: No results for input(s): VITAMINB12, FOLATE, FERRITIN, TIBC, IRON, RETICCTPCT in the last 72 hours. Sepsis Labs: No results for input(s): PROCALCITON, LATICACIDVEN in the last 168 hours.  No results found for this or any previous visit (from the past 240 hour(s)).       Radiology Studies: No results found.      Scheduled Meds: . lidocaine (PF)      . amLODipine  5 mg Oral QHS  . aspirin EC  81 mg Oral QHS  . carvedilol  25 mg  Oral BID WC  . darbepoetin (ARANESP) injection - DIALYSIS  100 mcg Intravenous Once  . docusate sodium  100 mg Oral QHS  . heparin  5,000 Units Subcutaneous Q8H  . hydrALAZINE  25 mg Oral TID  . hydrOXYzine  25 mg Oral TID  . insulin aspart  0-5 Units Subcutaneous QHS  . insulin aspart  0-9 Units Subcutaneous TID WC  . isosorbide mononitrate  30 mg Oral Daily  . pantoprazole  40 mg Oral Daily  . sodium chloride flush  3 mL Intravenous Q12H  . tamsulosin  0.4 mg Oral Daily  . zolpidem  10 mg Oral QHS   Continuous Infusions: . sodium chloride    . ferric gluconate (FERRLECIT/NULECIT) IV 125 mg (07/28/17 1112)     LOS: 4 days    Time spent: 35 minutes.  Elmarie Shiley, MD [facility] Pager [phone]  If 7PM-7AM, please contact night-coverage [email or web address] Password [email or web address] 07/29/2017, 1:39 PM

## 2017-07-29 NOTE — Progress Notes (Signed)
[facility] Rounding Note  Subjective/Interval History:  Had HD yesterday 4 kg removed All told weight down about 5 kg Due for another treatment today  Objective Vital signs in last 24 hours: Vitals:   07/28/17 1300 07/28/17 1600 07/28/17 2029 07/29/17 0500  BP: (!) 121/96 122/80 131/89 125/81  Pulse: 82  78 71  Resp: 20  18 18   Temp: 97.9 F (36.6 C)  98 F (36.7 C) 97.8 F (36.6 C)  TempSrc: Oral  Oral Oral  SpO2: 100%  100% 97%  Weight:      Height:       Weight change: 1 kg (2 lb 3.3 oz)  Intake/Output Summary (Last 24 hours) at 07/29/2017 1238 Last data filed at 07/29/2017 0700 Gross per 24 hour  Intake 720 ml  Output 4176 ml  Net -3456 ml    Physical Exam:  Blood pressure 125/81, pulse 71, temperature 97.8 F (36.6 C), temperature source Oral, resp. rate 18, height 6' (1.829 m), weight 90 kg (198 lb 6.6 oz), SpO2 97 %.  NAD. Looks more calm and comfortable Bilateral breath sounds decreased in bases with few bibasilar crackles. RRR with S1 S2.  Abdomen overall softer, less taut Still with abd wall pitting edema which is less   2+ pitting LE edema  RIJ TDC Drsg intact.  L upper arm AVF good bruit   Recent Labs  Lab 07/25/17 1110 07/26/17 0400 07/28/17 0844  NA 135 135 132*  K 5.1 3.9 4.5  CL 96* 98* 97*  CO2 22 24 24   GLUCOSE 124* 121* 155*  BUN 112* 58* 45*  CREATININE 10.64* 7.06* 6.09*  CALCIUM 9.2 8.3* 8.0*  PHOS  --   --  2.7    Recent Labs  Lab 07/25/17 1110 07/26/17 0400 07/28/17 0844  AST 25 25  --   ALT 22 21  --   ALKPHOS 138* 131*  --   BILITOT 0.7 1.0  --   PROT 7.6 6.8  --   ALBUMIN 3.7 3.3* 3.1*   Recent Labs  Lab 07/25/17 1110  LIPASE 42    Recent Labs  Lab 07/25/17 1110 07/26/17 0400 07/28/17 0350  WBC 7.3 6.6 5.6  HGB 10.5* 9.7* 10.3*  HCT 31.5* 29.0* 31.3*  MCV 85.8 86.6 86.7  PLT 190 164 168    Recent Labs  Lab 07/28/17 1350 07/28/17 1805 07/28/17 2055 07/29/17 0750 07/29/17 1213  GLUCAP 116* 169* 119*  124* 140*   Medications: . sodium chloride    . ferric gluconate (FERRLECIT/NULECIT) IV 125 mg (07/28/17 1112)   . amLODipine  5 mg Oral QHS  . aspirin EC  81 mg Oral QHS  . carvedilol  25 mg Oral BID WC  . darbepoetin (ARANESP) injection - DIALYSIS  100 mcg Intravenous Q Fri-HD  . docusate sodium  100 mg Oral QHS  . heparin  5,000 Units Subcutaneous Q8H  . hydrALAZINE  25 mg Oral TID  . hydrOXYzine  25 mg Oral TID  . insulin aspart  0-5 Units Subcutaneous QHS  . insulin aspart  0-9 Units Subcutaneous TID WC  . isosorbide mononitrate  30 mg Oral Daily  . pantoprazole  40 mg Oral Daily  . sodium chloride flush  3 mL Intravenous Q12H  . tamsulosin  0.4 mg Oral Daily  . zolpidem  10 mg Oral QHS   Dialysis Orders:  [facility]  MWF 4 hr 180 NRe  400/800 87 kg  2.0 K/2.0 Ca -No heparin -Mircera 75 mcg IV  q 2 week (recently resumed not given yet) -Venofer 100 mg IV X 10 (5/5 doses given-last dose 07/17/17 Fe 52 Tsat 21% 07/03/17) -Hectoral 1 mcg IV TIW (Last PTH 18!) BMD meds: Calcium acetate 667 mg PO TID AC (last Phos 3.2 Ca 10.4 07/03/17)  Assessment/Plan: 1. ESRD -  MWF  1. Still using TDC 2. Need to start outpt with expert cannulator  2.  Generalized anascara/volume overload 2/2 non-compliance with HD:  1. Serial HD for volume removal.  1. Weight down all told about 5 kg net based on weights 2. Ascites on CT marked. Might benefit from paracentesis 3. HD again today  3.  L Rib pain: Fall last Tuesday - no evidence of rib fx on Xray.  1. CT neg for abd hematoma (+ for marked ascites, abd wall edema) 4. Anxiety and depression - interfering significantly with pt's ability to complete a dialysis and is spilling over into home with agitation, anger - wife very frustrated, pt equally so. Is on hydroxyzine for anxiety by his primary care (I changed from prn to standing tid). I personally think would benefit from antidepressant, and even psychiatric outpt care. He declined initial  consultation but is now agreeable for help with med management 5. Anemia  - Cont Fe load from OP center. Aranesp with HD - NOT GIVEN YESTERDAY AS ORDERED. GIVE TODAY. 6.  Metabolic bone disease  1. Resumed binders (ca acetate TID ac) 2. Holding hectorol (PTH 18 outpt)  7. Nutrition - renal carb mod diet 8. DM: per primary   Jamal Maes, MD [facility] [phone] pager 07/29/2017, 12:38 PM

## 2017-07-29 NOTE — Procedures (Signed)
   US guided RLQ paracentesis  4.6 L amber color fluid  Tolerated well  No labs sent per MD

## 2017-07-29 NOTE — Progress Notes (Signed)
Patient arrived to unit by bed.  Reviewed treatment plan and this RN agrees with plan.  Report received from bedside RN, Lorriane Shire.  Consent verified.  Patient A & O X 4.   Lung sounds diminished to ausculation in all fields. BLE pitting edema. Cardiac:  NSR.  Removed caps and cleansed RIJ catheter with chlorhedxidine.  Aspirated ports of heparin and flushed them with saline per protocol.  Connected and secured lines, initiated treatment at 1751.  UF Goal of 4500 mL and net fluid removal 4 L.  Will continue to monitor.

## 2017-07-30 DIAGNOSIS — R079 Chest pain, unspecified: Secondary | ICD-10-CM

## 2017-07-30 DIAGNOSIS — Z87891 Personal history of nicotine dependence: Secondary | ICD-10-CM

## 2017-07-30 DIAGNOSIS — G4700 Insomnia, unspecified: Secondary | ICD-10-CM

## 2017-07-30 DIAGNOSIS — R450 Nervousness: Secondary | ICD-10-CM

## 2017-07-30 DIAGNOSIS — F4320 Adjustment disorder, unspecified: Secondary | ICD-10-CM | POA: Diagnosis not present

## 2017-07-30 DIAGNOSIS — N186 End stage renal disease: Secondary | ICD-10-CM | POA: Diagnosis not present

## 2017-07-30 DIAGNOSIS — R0781 Pleurodynia: Secondary | ICD-10-CM | POA: Diagnosis not present

## 2017-07-30 LAB — GLUCOSE, CAPILLARY
Glucose-Capillary: 109 mg/dL — ABNORMAL HIGH (ref 65–99)
Glucose-Capillary: 123 mg/dL — ABNORMAL HIGH (ref 65–99)
Glucose-Capillary: 123 mg/dL — ABNORMAL HIGH (ref 65–99)
Glucose-Capillary: 133 mg/dL — ABNORMAL HIGH (ref 65–99)

## 2017-07-30 MED ORDER — QUETIAPINE FUMARATE 50 MG PO TABS
100.0000 mg | ORAL_TABLET | Freq: Every day | ORAL | Status: DC
  Administered 2017-07-30 – 2017-08-01 (×2): 100 mg via ORAL
  Filled 2017-07-30: qty 2

## 2017-07-30 MED ORDER — QUETIAPINE FUMARATE 50 MG PO TABS: 50.0000 mg | ORAL_TABLET | ORAL | Status: DC

## 2017-07-30 NOTE — Progress Notes (Signed)
Patient awake all night, stated that pain still is being felt on left side, close to rib cage due to fall.  He has been putting cold therapy with ice pack, received Oxycodone for pain, and feels that he cannot find relief or be comfortable.

## 2017-07-30 NOTE — Consult Note (Addendum)
[place] [facility] Consult   Reason for Consult:  Anxiety, insomnia, dialysis discomfort Referring Physician:  Dr. Tyrell Antonio Patient Identification: James Burke MRN:  [id number] Principal Diagnosis: ESRD (end stage renal disease) [facility]) Diagnosis:   Patient Active Problem List   Diagnosis Date Noted  . Chest pain [R07.9] 07/25/2017  . Rib pain [R07.81] 07/25/2017  . Encounter for chronic pain management [G89.29] 06/15/2017  . Pericardial effusion without cardiac tamponade [I31.3]   . ESRD (end stage renal disease) ([place]) [N18.6]   . AKI (acute kidney injury) ([place]) [N17.9]   . Hypertensive heart and chronic kidney disease with heart failure and stage 1 through stage 4 chronic kidney disease, or chronic kidney disease ([place]) [I13.0]   . Acute on chronic combined systolic and diastolic congestive heart failure ([place]) [I50.43] 12/30/2016  . BPH (benign prostatic hyperplasia) [N40.0] 12/30/2016  . Charcot foot due to diabetes mellitus ([place]) [E11.610] 10/23/2015  . Nonischemic cardiomyopathy ([place]) [I42.8] 09/17/2014  . Insomnia [G47.00] 07/16/2014  . Pericardial effusion [I31.3] 06/25/2014  . Chronic combined systolic and diastolic heart failure ([place]) [I50.42] 04/17/2014  . Vitiligo [L80] 03/13/2014  . Diabetic retinopathy associated with type 2 diabetes mellitus ([place]) [E11.319] 01/12/2012  . Hypertriglyceridemia [E78.1] 01/12/2012  . Essential hypertension, benign [I10] 01/12/2012  . Elevated LFTs [R94.5] 01/12/2012  . Controlled type 2 diabetes with renal manifestation ([place]) [E11.29] 01/03/2012  . Chronic systolic CHF (congestive heart failure), NYHA class 1 ([place]) [I50.22] 01/03/2012    Total Time spent with patient: 30 minutes  Subjective:   James Burke is a 51 y.o. male patient admitted with ESRD exacerbation.  HPI:  James Burke is pleasant and cooperative. Is presently admitted with fluid overload, sent by dialysis center. A significant challenge for him regarding  dialysis care has been difficulty in tolerating the sitting still and being "constrained" to the chair for several hours. This triggers anxiety and restlessness in him.  He has always been a physical and active individual and this has been a substantial adjustment since he started dialysis in April 2018.  Because of his restlessness and anxiety, he has not been able to received adequate hours of dialysis and treatment has been stopped early at times.   Spent time discussing his adjustment to dialysis overall.  He appears to struggle with some adjustment difficulties as above, in addition to anticipatory anxiety and insomnia the night before.  He reports Lorrin Mais has only been partly effective and has deteriorated in effect over time.  He and I spent time discussing his sense of mortality, especially given mortality rates of dialysis and his comorbid factors.  He does not have any SI or fears of death and dying - he is predominantly focussed on being comfortable enough to do what he needs to so that he can tolerate dialysis treatments adequately.    He has no prior psychiatric history or treatment, or counseling. He is a very "straight forward" guy and does not feel therapy would be helpful.  He does admit that his mood is probably worse than it used to be, and he feels if he could sleep well that would address much of his problems.  He has tried ativan, benadryl during dialysis.  Ativan was disinhibiting and benadryl was partially helpful.  I discussed the utility of Seroquel for sleep on a scheduled nightly basis, and suggested to discontinue ambien.  Further, I suggested a low dose of Seroquel 50-100 mg about 30 minutes before dialysis so that its easier for him to  rest and tolerate the distress associated with treatment.  Educated him and family on the deleterious effects including risk of worsening glucose by way of elevating appetite.  He is agreeable to try the medicine to see if it helps.   We agreed to  start seroquel 100-200 mg qhs and 50-100 mg prior to dialysis treatments M/W/F.   Past Psychiatric History: none  Risk to Self: Is patient at risk for suicide?: No Risk to Others:   Prior Inpatient Therapy:   Prior Outpatient Therapy:    Past Medical History:  Past Medical History:  Diagnosis Date  . Anemia   . Arthritis, septic, knee ([place])   . C. difficile colitis 04/18/2008  . Congestive heart failure ([place])   . Diabetes mellitus    Type II  . DVT (deep venous thrombosis) ([facility])    right leg   . Dyspnea    "when I have too much fluid.'  . ESRD (end stage renal disease) ([place])    Tues, Thurs  . GERD (gastroesophageal reflux disease)   . History of blood transfusion 2017  . Pneumonia     Past Surgical History:  Procedure Laterality Date  . APPENDECTOMY  1995  . BASCILIC VEIN TRANSPOSITION Left 01/03/2017   Procedure: FIRST STAGE BRACHIAL VEIN TRANSPOSITION;  Surgeon: Conrad Greer, MD;  Location: [facility];  Service: Vascular;  Laterality: Left;  . BASCILIC VEIN TRANSPOSITION Left 04/24/2017   Procedure: LEFT SECOND STAGE BRACHIAL VEIN TRANSPOSITION;  Surgeon: Conrad [place], MD;  Location: [place];  Service: Vascular;  Laterality: Left;  . EYE SURGERY    . INSERTION OF DIALYSIS CATHETER Right 01/03/2017   Procedure: INSERTION OF DIALYSIS CATHETER RIGHT INTERNAL JUGULAR;  Surgeon: Conrad , MD;  Location: [facility];  Service: Vascular;  Laterality: Right;  . KNEE SURGERY Left 04/2014  . [place]  Bilateral   . PERICARDIOCENTESIS N/A 01/03/2017   Procedure: Pericardiocentesis;  Surgeon: Burnell Blanks, MD;  Location: [facility];  Service: Cardiovascular;  Laterality: N/A;   Family History:  Family History  Problem Relation Age of Onset  . Heart disease Unknown        No family history  . Cancer Brother    Family Psychiatric  History: none  Social History:  Social History   Substance and Sexual Activity  Alcohol Use No  . Alcohol/week: 0.0 oz     Social History    Substance and Sexual Activity  Drug Use No    Social History   Socioeconomic History  . Marital status: Married    Spouse name: None  . Number of children: 3  . Years of education: None  . Highest education level: None  Social Needs  . Financial resource strain: None  . Food insecurity - worry: None  . Food insecurity - inability: None  . Transportation needs - medical: None  . Transportation needs - non-medical: None  Occupational History  . None  Tobacco Use  . Smoking status: Never Smoker  . Smokeless tobacco: Former Systems developer    Types: Chew  . Tobacco comment: no chewing 01/2017  Substance and Sexual Activity  . Alcohol use: No    Alcohol/week: 0.0 oz  . Drug use: No  . Sexual activity: Yes  Other Topics Concern  . None  Social History Narrative  . None   Additional Social History:    Allergies:   Allergies  Allergen Reactions  . Prednisone Other (See Comments)    BLINDNESS > [ ?  CSCR ? ]  . Simvastatin Other (See Comments)    MYALGIAS, JOINT PAIN  . Lorazepam Anxiety and Other (See Comments)    Nervousness and "fidgets"; legs "need to kick  . Methocarbamol Diarrhea    Labs:  Results for orders placed or performed during the hospital encounter of 07/25/17 (from the past 48 hour(s))  Glucose, capillary     Status: Abnormal   Collection Time: 07/28/17  1:50 PM  Result Value Ref Range   Glucose-Capillary 116 (H) 65 - 99 mg/dL  Glucose, capillary     Status: Abnormal   Collection Time: 07/28/17  6:05 PM  Result Value Ref Range   Glucose-Capillary 169 (H) 65 - 99 mg/dL  Glucose, capillary     Status: Abnormal   Collection Time: 07/28/17  8:55 PM  Result Value Ref Range   Glucose-Capillary 119 (H) 65 - 99 mg/dL  Glucose, capillary     Status: Abnormal   Collection Time: 07/29/17  7:50 AM  Result Value Ref Range   Glucose-Capillary 124 (H) 65 - 99 mg/dL  Glucose, capillary     Status: Abnormal   Collection Time: 07/29/17 12:13 PM  Result Value Ref Range    Glucose-Capillary 140 (H) 65 - 99 mg/dL  Glucose, capillary     Status: Abnormal   Collection Time: 07/29/17  4:44 PM  Result Value Ref Range   Glucose-Capillary 166 (H) 65 - 99 mg/dL  Renal function panel     Status: Abnormal   Collection Time: 07/29/17  6:10 PM  Result Value Ref Range   Sodium 129 (L) 135 - 145 mmol/L   Potassium 4.2 3.5 - 5.1 mmol/L   Chloride 93 (L) 101 - 111 mmol/L   CO2 25 22 - 32 mmol/L   Glucose, Bld 185 (H) 65 - 99 mg/dL   BUN 34 (H) 6 - 20 mg/dL   Creatinine, Ser 5.32 (H) 0.61 - 1.24 mg/dL   Calcium 8.1 (L) 8.9 - 10.3 mg/dL   Phosphorus 2.4 (L) 2.5 - 4.6 mg/dL   Albumin 3.2 (L) 3.5 - 5.0 g/dL   GFR calc non Af Amer 11 (L) >60 mL/min   GFR calc Af Amer 13 (L) >60 mL/min    Comment: (NOTE) The eGFR has been calculated using the CKD EPI equation. This calculation has not been validated in all clinical situations. eGFR's persistently <60 mL/min signify possible Chronic Kidney Disease.    Anion gap 11 5 - 15  Glucose, capillary     Status: Abnormal   Collection Time: 07/29/17 10:11 PM  Result Value Ref Range   Glucose-Capillary 149 (H) 65 - 99 mg/dL  CBC     Status: Abnormal   Collection Time: 07/29/17 10:14 PM  Result Value Ref Range   WBC 6.0 4.0 - 10.5 K/uL   RBC 3.83 (L) 4.22 - 5.81 MIL/uL   Hemoglobin 10.9 (L) 13.0 - 17.0 g/dL   HCT 32.9 (L) 39.0 - 52.0 %   MCV 85.9 78.0 - 100.0 fL   MCH 28.5 26.0 - 34.0 pg   MCHC 33.1 30.0 - 36.0 g/dL   RDW 17.4 (H) 11.5 - 15.5 %   Platelets 161 150 - 400 K/uL  Glucose, capillary     Status: Abnormal   Collection Time: 07/30/17  7:30 AM  Result Value Ref Range   Glucose-Capillary 109 (H) 65 - 99 mg/dL  Glucose, capillary     Status: Abnormal   Collection Time: 07/30/17 11:28 AM  Result Value Ref Range  Glucose-Capillary 123 (H) 65 - 99 mg/dL    Current Facility-Administered Medications  Medication Dose Route Frequency Provider Last Rate Last Dose  . 0.9 %  sodium chloride infusion  250 mL  Intravenous PRN Waldemar Dickens, MD      . acetaminophen (TYLENOL) tablet 1,000 mg  1,000 mg Oral Q6H PRN Waldemar Dickens, MD   1,000 mg at 07/29/17 1945   Or  . acetaminophen (TYLENOL) suppository 650 mg  650 mg Rectal Q6H PRN Waldemar Dickens, MD      . amLODipine (NORVASC) tablet 5 mg  5 mg Oral QHS Jamal Maes, MD   5 mg at 07/29/17 2251  . aspirin EC tablet 81 mg  81 mg Oral QHS Waldemar Dickens, MD   81 mg at 07/29/17 2250  . carvedilol (COREG) tablet 25 mg  25 mg Oral BID WC Waldemar Dickens, MD   25 mg at 07/30/17 0300  . diclofenac sodium (VOLTAREN) 1 % transdermal gel 4 g  4 g Topical QID PRN Waldemar Dickens, MD      . diphenhydrAMINE-zinc acetate (BENADRYL) 2-0.1 % cream   Topical BID PRN Regalado, Belkys A, MD      . docusate sodium (COLACE) capsule 100 mg  100 mg Oral QHS Waldemar Dickens, MD   100 mg at 07/29/17 2250  . ferric gluconate (NULECIT) 125 mg in sodium chloride 0.9 % 100 mL IVPB  125 mg Intravenous Q M,W,F-HD Jamal Maes, MD 110 mL/hr at 07/28/17 1112 125 mg at 07/28/17 1112  . heparin injection 5,000 Units  5,000 Units Subcutaneous Q8H Waldemar Dickens, MD      . hydrALAZINE (APRESOLINE) tablet 25 mg  25 mg Oral TID Waldemar Dickens, MD   25 mg at 07/30/17 9233  . hydrOXYzine (ATARAX/VISTARIL) tablet 25 mg  25 mg Oral TID Jamal Maes, MD   25 mg at 07/30/17 0076  . insulin aspart (novoLOG) injection 0-5 Units  0-5 Units Subcutaneous QHS Waldemar Dickens, MD      . insulin aspart (novoLOG) injection 0-9 Units  0-9 Units Subcutaneous TID WC Waldemar Dickens, MD      . isosorbide mononitrate (IMDUR) 24 hr tablet 30 mg  30 mg Oral Daily Waldemar Dickens, MD   30 mg at 07/28/17 1741  . lidocaine (XYLOCAINE) 2 % jelly 1 application  1 application Topical TID PRN Waldemar Dickens, MD      . morphine 4 MG/ML injection 4 mg  4 mg Intravenous Q4H PRN Waldemar Dickens, MD   4 mg at 07/30/17 0601  . ondansetron (ZOFRAN) tablet 4 mg  4 mg Oral Q6H PRN Waldemar Dickens,  MD       Or  . ondansetron [facility]) injection 4 mg  4 mg Intravenous Q6H PRN Waldemar Dickens, MD   4 mg at 07/26/17 1029  . oxyCODONE (Oxy IR/ROXICODONE) immediate release tablet 5-10 mg  5-10 mg Oral Q4H PRN Waldemar Dickens, MD   10 mg at 07/30/17 1313  . pantoprazole (PROTONIX) EC tablet 40 mg  40 mg Oral Daily Waldemar Dickens, MD   40 mg at 07/30/17 2263  . sodium chloride flush (NS) 0.9 % injection 3 mL  3 mL Intravenous Q12H Waldemar Dickens, MD   3 mL at 07/30/17 0939  . sodium chloride flush (NS) 0.9 % injection 3 mL  3 mL Intravenous PRN Waldemar Dickens, MD      .  tamsulosin (FLOMAX) capsule 0.4 mg  0.4 mg Oral Daily Waldemar Dickens, MD   0.4 mg at 07/28/17 1741  . zolpidem (AMBIEN) tablet 10 mg  10 mg Oral QHS Waldemar Dickens, MD   10 mg at 07/29/17 2250    Musculoskeletal: Strength & Muscle Tone: abnormal Gait & Station: unsteady Patient leans: Right  Psychiatric Specialty Exam: Physical Exam  Review of Systems  Constitutional: Negative.   HENT: Negative.   Eyes: Negative.   Respiratory: Positive for shortness of breath.   Cardiovascular: Positive for leg swelling.  Gastrointestinal: Positive for nausea.  Genitourinary: Negative.   Musculoskeletal: Positive for back pain and joint pain.  Neurological: Negative.   Psychiatric/Behavioral: Positive for depression. Negative for hallucinations, memory loss, substance abuse and suicidal ideas. The patient is nervous/anxious and has insomnia.     Blood pressure 122/89, pulse 85, temperature 97.9 F (36.6 C), temperature source Oral, resp. rate 18, height 6' (1.829 m), weight 87.1 kg (192 lb 0.3 oz), SpO2 100 %.Body mass index is 26.04 kg/m.  General Appearance: Casual and Fairly Groomed  Eye Contact:  Fair  Speech:  Clear and Coherent  Volume:  Normal  Mood:  Euthymic  Affect:  Congruent  Thought Process:  Coherent, Goal Directed and Descriptions of Associations: Intact  Orientation:  Full (Time, Place, and Person)   Thought Content:  Logical  Suicidal Thoughts:  No  Homicidal Thoughts:  No  Memory:  Immediate;   Fair  Judgement:  Good  Insight:  Fair  Psychomotor Activity:  Normal  Concentration:  Attention Span: Good  Recall:  [place] of Knowledge:  Good  Language:  Good  Akathisia:  Negative  Handed:  Right  AIMS (if indicated):     Assets:  Communication Skills Desire for Improvement Financial Resources/Insurance Housing Social Support Transportation  ADL's:  Intact  Cognition:  WNL  Sleep:   poor, especially prior to dialysis days    Treatment Plan Summary: James Burke is a 51 year old male with multiple medical problems, including recent ESRD on M/W/F HD as of April 2018.  He has continued to work despite this stressor and identifies as a fairly active and self sufficient individual.  He has had significant difficulty tolerating HD treatments contributing to inadequate fluid coming off, and is presently admitted today with volume overload.  He also struggles with nausea and poor appetite contributing to missing HD treatments.  He has been tried or fairly conservative measures including PO benadryl, PO ativan, IV ativan to assist with anxiety and nausea.   I suggest we proceed as below to address insomnia and treatment intolerance.  Patient has been educated on the metabolic and EPS risks of atypical antipsychotics, and I educated them that these are typically used for bipolar and thought disorder but at lower doses can help with mood and anxiety symptoms.  No acute safety issues or SI.  He declines outpatient therapy at this time.  I would suggest outpatient psychiatric follow-up for med management on discharge.    - Discontinue Ambien given lack of benefit  - Seroquel 100-200 mg nightly for sleep; start with 100 mg and if patient is not asleep after 2 hours, may administer additional 100 mg - Seroquel 50-100 mg 30-45 minutes prior to HD treatments; id suggest starting with 50 mg  and administer additional dose if needed after 1 hour - EKG reviewed and QTc is marginally elevated; I would suggest repeating this EKG tomorrow AM to assess for  any prolonged QTc as a result of seroquel    Disposition: No evidence of imminent risk to self or others at present.   Patient does not meet criteria for psychiatric inpatient admission. Supportive therapy provided about ongoing stressors.    Aundra Dubin, MD 07/30/2017 1:31 PM

## 2017-07-30 NOTE — Progress Notes (Signed)
[facility] Rounding Note  Subjective/Interval History:  4 liters off with HD yesterday Paracentesis of 4.6 liters Weights don't match all the volume he has had off past few days but exam clearly better Still requiring IV morphine for left focal rib pain  Had ativan  Both IV and then po with HD - says "gave him the fidgets" - will not give any additional Up all night could not sleep "fidgets"  "What are we going to do to help me get through HD tomorrow? I can't keep getting IV morphine"  Objective Vital signs in last 24 hours: Vitals:   07/29/17 2151 07/29/17 2153 07/29/17 2214 07/30/17 0500  BP: 138/82 140/78 (!) 126/95 135/78  Pulse: 76 79 85 85  Resp: 14  16 16   Temp: 98 F (36.7 C)  98.3 F (36.8 C) 98.4 F (36.9 C)  TempSrc:   Oral Oral  SpO2:   99% 100%  Weight: 87.1 kg (192 lb 0.3 oz)     Height:       Weight change: -2.9 kg (-6.3 oz)  Intake/Output Summary (Last 24 hours) at 07/30/2017 0807 Last data filed at 07/30/2017 0345 Gross per 24 hour  Intake 620 ml  Output 4000 ml  Net -3380 ml    Physical Exam:  Blood pressure 135/78, pulse 85, temperature 98.4 F (36.9 C), temperature source Oral, resp. rate 16, height 6' (1.829 m), weight 87.1 kg (192 lb 0.3 oz), SpO2 100 %.  NAD. Up in the chair Lungs clear S1S2 No S3 Abd much less tight Persistent left lower focal rib tenderness Wearing boiot RLE LLE still with 3+ pitting edema to the knee but most of the thigh edema is gone RIJ TDC Drsg intact.  L upper arm AVF good bruit   Recent Labs  Lab 07/25/17 1110 07/26/17 0400 07/28/17 0844 07/29/17 1810  NA 135 135 132* 129*  K 5.1 3.9 4.5 4.2  CL 96* 98* 97* 93*  CO2 22 24 24 25   GLUCOSE 124* 121* 155* 185*  BUN 112* 58* 45* 34*  CREATININE 10.64* 7.06* 6.09* 5.32*  CALCIUM 9.2 8.3* 8.0* 8.1*  PHOS  --   --  2.7 2.4*    Recent Labs  Lab 07/25/17 1110 07/26/17 0400 07/28/17 0844 07/29/17 1810  AST 25 25  --   --   ALT 22 21  --   --   ALKPHOS 138*  131*  --   --   BILITOT 0.7 1.0  --   --   PROT 7.6 6.8  --   --   ALBUMIN 3.7 3.3* 3.1* 3.2*   Recent Labs  Lab 07/25/17 1110  LIPASE 42    Recent Labs  Lab 07/25/17 1110 07/26/17 0400 07/28/17 0350 07/29/17 2214  WBC 7.3 6.6 5.6 6.0  HGB 10.5* 9.7* 10.3* 10.9*  HCT 31.5* 29.0* 31.3* 32.9*  MCV 85.8 86.6 86.7 85.9  PLT 190 164 168 161    Recent Labs  Lab 07/29/17 0750 07/29/17 1213 07/29/17 1644 07/29/17 2211 07/30/17 0730  GLUCAP 124* 140* 166* 149* 109*   Medications: . sodium chloride    . ferric gluconate (FERRLECIT/NULECIT) IV 125 mg (07/28/17 1112)   . amLODipine  5 mg Oral QHS  . aspirin EC  81 mg Oral QHS  . carvedilol  25 mg Oral BID WC  . docusate sodium  100 mg Oral QHS  . heparin  5,000 Units Subcutaneous Q8H  . hydrALAZINE  25 mg Oral TID  . hydrOXYzine  25 mg Oral TID  . insulin aspart  0-5 Units Subcutaneous QHS  . insulin aspart  0-9 Units Subcutaneous TID WC  . isosorbide mononitrate  30 mg Oral Daily  . pantoprazole  40 mg Oral Daily  . sodium chloride flush  3 mL Intravenous Q12H  . tamsulosin  0.4 mg Oral Daily  . zolpidem  10 mg Oral QHS   Dialysis Orders:  [facility]  MWF 4 hr 180 NRe  400/800 87 kg  2.0 K/2.0 Ca -No heparin -Mircera 75 mcg IV q 2 week (recently resumed not given yet) -Venofer 100 mg IV X 10 (5/5 doses given-last dose 07/17/17 Fe 52 Tsat 21% 07/03/17) -Hectoral 1 mcg IV TIW (Last PTH 18!) BMD meds: Calcium acetate 667 mg PO TID AC (last Phos 3.2 Ca 10.4 07/03/17)  Assessment/Plan: 1. ESRD -  MWF  1. Still using TDC 2. (Will start use outpt with expert cannulator but since not regularly used have not wanted to start using inpt with need for daily HD here in the hospital)  3. HD Monday (on schedule) with 4 liter goal. Hope to get to EDW  2.  Generalized anascara/volume overload 2/2 non-compliance with HD:  1. Serial HD for volume removal.  2. Paracentesis 11/24 for ascites 3. Weight and exam markedly  better  3.  L Rib pain: Fall last Tuesday - no evidence of rib fx on Xray.  1. Still requiring IV morphine  4. Anxiety and depression - just on hydroxyzine. Has agreed now to talk to psyche to help wiuth medication that will help his anxiety (exp with HD) and depression and sleep disorder 1. Paradoxical reaction to IV and po ativan - do not use 2. Continue hydroxyzine] 3. Await psychiatry input  5. Anemia  - Cont Fe load from OP center. Aranesp with HD  6.  Metabolic bone disease  1. Resumed binders (ca acetate TID ac) 2. Holding hectorol (PTH 18 outpt)  7. Nutrition - renal carb mod diet 8. DM: per primary   Jamal Maes, MD [facility] [phone] pager 07/30/2017, 8:07 AM

## 2017-07-30 NOTE — Progress Notes (Signed)
PROGRESS NOTE    James Burke  OFB:[id number] DOB: 06/24/1966 DOA: 07/25/2017 PCP: [name], MD    Brief Narrative: James Burke is a 51 y.o. male with medical history significant of C. difficile colitis, CHF, diabetes, DVT, ESRD on dialysis, GERD.  Patient presents after being told by his dialysis center to come to the hospital due to missing dialysis for more than a week. Patient endorses missing his dialysis sessions due to intermittent episodes of nausea and vomiting with eating making her feel too weak to be able to go to dialysis. Patient typically dialyzes on Monday Wednesday and Friday. Patient denies abdominal pain, diarrhea, fevers, flank pain, neck stiffness, headache, focal neurological deficits, rash, palpitations, shortness of breath. Symptoms of nausea vomiting and food intolerance are improving without intervention.  L rib cage pain: Patient reports falling approximately 7 days ago. Mechanical fall. Pain throughout left lower rib area since that time. Constant. Worse with. Improves with home opioids. Denies palpitations, substernal chest pain with associated nausea and shortness of breath or radiation. Some relief also with icing area.  Assessment & Plan:   Principal Problem:   ESRD (end stage renal disease) ([place]) Active Problems:   Controlled type 2 diabetes with renal manifestation (HCC)   Chronic systolic CHF (congestive heart failure), NYHA class 1 (HCC)   Essential hypertension, benign   Charcot foot due to diabetes mellitus (HCC)   Chest pain   Rib pain  1-ESRD; volume overload;  Had HD again 11-21, 11-23-, 11-24 Appreciate nephrology help.  Volume status improving.  Weight 209----198--192 For HD 11-26  2-Depression, anxiety;   Appreciate Dr Daron Offer.  Plan to start Seroquel at HS. And PRN Seroquel 50 mg prior to HD.  3-Chest wall, Ribs pain.  X ary negative for fracture.  Pain management.  CT abdomen negative for hematoma, has massive  ascites.   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 No evidence of acute decompensation. - Continue beta blocker -I/O, daily weights  6-Massive ascites; discussed with Dr Lorrene Reid, will ask fro US paracentesis. Therapeutic.  Will give ativan prior to procedure.   N/V:  - Zofran PRN - suspect related to uremia.   DM: on trulicity. Last injection 07/22/17 - SSI refuse insulin coverage.   GERD: - continue PPI     DVT prophylaxis: Heparin Code Status: Full code.  Family Communication: care discussed with patient.  Disposition Plan: to be determine   Consultants:   Nephrology    Procedures: HD   Antimicrobials: none   Subjective: He report adverse effect from ativan, making him edgy.  He relates oxycodone is helping with pain, morphine is helping today to keep him calm.   Objective: Vitals:   07/29/17 2153 07/29/17 2214 07/30/17 0500 07/30/17 1315  BP: 140/78 (!) 126/95 135/78 122/89  Pulse: 79 85 85   Resp:  16 16 18   Temp:  98.3 F (36.8 C) 98.4 F (36.9 C) 97.9 F (36.6 C)  TempSrc:  Oral Oral Oral  SpO2:  99% 100% 100%  Weight:      Height:        Intake/Output Summary (Last 24 hours) at 07/30/2017 1440 Last data filed at 07/30/2017 0845 Gross per 24 hour  Intake 860 ml  Output 4000 ml  Net -3140 ml   Filed Weights   07/28/17 1254 07/29/17 1742 07/29/17 2151  Weight: 90 kg (198  lb 6.6 oz) 91.1 kg (200 lb 13.4 oz) 87.1 kg (192 lb 0.3 oz)    Examination:  General exam: NAD Respiratory system: Normal respiratory effort, bilateral crackles.  Cardiovascular system: S 1, S 2 RRR, plus 2 edema Gastrointestinal system: less distended, no rigidity  Central nervous system: non focal.  Extremities: symmetric power.     Data Reviewed: I have personally reviewed following labs and imaging  studies  CBC: Recent Labs  Lab 07/25/17 1110 07/26/17 0400 07/28/17 0350 07/29/17 2214  WBC 7.3 6.6 5.6 6.0  HGB 10.5* 9.7* 10.3* 10.9*  HCT 31.5* 29.0* 31.3* 32.9*  MCV 85.8 86.6 86.7 85.9  PLT 190 164 168 573   Basic Metabolic Panel: Recent Labs  Lab 07/25/17 1110 07/26/17 0400 07/28/17 0844 07/29/17 1810  NA 135 135 132* 129*  K 5.1 3.9 4.5 4.2  CL 96* 98* 97* 93*  CO2 22 24 24 25   GLUCOSE 124* 121* 155* 185*  BUN 112* 58* 45* 34*  CREATININE 10.64* 7.06* 6.09* 5.32*  CALCIUM 9.2 8.3* 8.0* 8.1*  PHOS  --   --  2.7 2.4*   GFR: Estimated Creatinine Clearance: 18.4 mL/min (A) (by C-G formula based on SCr of 5.32 mg/dL (H)). Liver Function Tests: Recent Labs  Lab 07/25/17 1110 07/26/17 0400 07/28/17 0844 07/29/17 1810  AST 25 25  --   --   ALT 22 21  --   --   ALKPHOS 138* 131*  --   --   BILITOT 0.7 1.0  --   --   PROT 7.6 6.8  --   --   ALBUMIN 3.7 3.3* 3.1* 3.2*   Recent Labs  Lab 07/25/17 1110  LIPASE 42   No results for input(s): AMMONIA in the last 168 hours. Coagulation Profile: No results for input(s): INR, PROTIME in the last 168 hours. Cardiac Enzymes: No results for input(s): CKTOTAL, CKMB, CKMBINDEX, TROPONINI in the last 168 hours. BNP (last 3 results) No results for input(s): PROBNP in the last 8760 hours. HbA1C: No results for input(s): HGBA1C in the last 72 hours. CBG: Recent Labs  Lab 07/29/17 1213 07/29/17 1644 07/29/17 2211 07/30/17 0730 07/30/17 1128  GLUCAP 140* 166* 149* 109* 123*   Lipid Profile: No results for input(s): CHOL, HDL, LDLCALC, TRIG, CHOLHDL, LDLDIRECT in the last 72 hours. Thyroid Function Tests: No results for input(s): TSH, T4TOTAL, FREET4, T3FREE, THYROIDAB in the last 72 hours. Anemia Panel: No results for input(s): VITAMINB12, FOLATE, FERRITIN, TIBC, IRON, RETICCTPCT in the last 72 hours. Sepsis Labs: No results for input(s): PROCALCITON, LATICACIDVEN in the last 168 hours.  No results found for  this or any previous visit (from the past 240 hour(s)).       Radiology Studies: US Paracentesis  Result Date: 07/30/2017 INDICATION: ascites EXAM: ULTRASOUND-GUIDED PARACENTESIS COMPARISON:  None. MEDICATIONS: 10 cc 1% lidocaine. COMPLICATIONS: None immediate. TECHNIQUE: Informed written consent was obtained from the patient after a discussion of the risks, benefits and alternatives to treatment. A timeout was performed prior to the initiation of the procedure. Initial ultrasound scanning demonstrates a large amount of ascites within the right lower abdominal quadrant. The right lower abdomen was prepped and draped in the usual sterile fashion. 1% lidocaine with epinephrine was used for local anesthesia. Under direct ultrasound guidance, a 19 gauge, 7-cm, Yueh catheter was introduced. An ultrasound image was saved for documentation purposed. The paracentesis was performed. The catheter was removed and a dressing was applied. The patient tolerated the procedure well without  immediate post procedural complication. FINDINGS: A total of approximately 4.6 liters of amber fluid was removed. IMPRESSION: Successful ultrasound-guided paracentesis yielding 4.6 liters of peritoneal fluid. Read by Lavonia Drafts [facility] Electronically Signed   By: Corrie Mckusick D.O.   On: 07/29/2017 14:17        Scheduled Meds: . amLODipine  5 mg Oral QHS  . aspirin EC  81 mg Oral QHS  . carvedilol  25 mg Oral BID WC  . docusate sodium  100 mg Oral QHS  . heparin  5,000 Units Subcutaneous Q8H  . hydrALAZINE  25 mg Oral TID  . hydrOXYzine  25 mg Oral TID  . insulin aspart  0-5 Units Subcutaneous QHS  . insulin aspart  0-9 Units Subcutaneous TID WC  . isosorbide mononitrate  30 mg Oral Daily  . pantoprazole  40 mg Oral Daily  . sodium chloride flush  3 mL Intravenous Q12H  . tamsulosin  0.4 mg Oral Daily  . zolpidem  10 mg Oral QHS   Continuous Infusions: . sodium chloride    . ferric gluconate (FERRLECIT/NULECIT)  IV 125 mg (07/28/17 1112)     LOS: 5 days    Time spent: 35 minutes.     Elmarie Shiley, MD [facility] Pager [phone]  If 7PM-7AM, please contact night-coverage [email or web address] Password [email or web address] 07/30/2017, 2:40 PM

## 2017-07-31 DIAGNOSIS — K5900 Constipation, unspecified: Secondary | ICD-10-CM

## 2017-07-31 DIAGNOSIS — F419 Anxiety disorder, unspecified: Secondary | ICD-10-CM

## 2017-07-31 LAB — RENAL FUNCTION PANEL
Albumin: 3 g/dL — ABNORMAL LOW (ref 3.5–5.0)
Anion gap: 10 (ref 5–15)
BUN: 33 mg/dL — ABNORMAL HIGH (ref 6–20)
CO2: 26 mmol/L (ref 22–32)
Calcium: 8.3 mg/dL — ABNORMAL LOW (ref 8.9–10.3)
Chloride: 94 mmol/L — ABNORMAL LOW (ref 101–111)
Creatinine, Ser: 5.88 mg/dL — ABNORMAL HIGH (ref 0.61–1.24)
GFR calc Af Amer: 12 mL/min — ABNORMAL LOW (ref >60)
GFR calc non Af Amer: 10 mL/min — ABNORMAL LOW (ref >60)
Glucose, Bld: 119 mg/dL — ABNORMAL HIGH (ref 65–99)
Phosphorus: 3 mg/dL (ref 2.5–4.6)
Potassium: 4 mmol/L (ref 3.5–5.1)
Sodium: 130 mmol/L — ABNORMAL LOW (ref 135–145)

## 2017-07-31 LAB — GLUCOSE, CAPILLARY
Glucose-Capillary: 150 mg/dL — ABNORMAL HIGH (ref 65–99)
Glucose-Capillary: 153 mg/dL — ABNORMAL HIGH (ref 65–99)
Glucose-Capillary: 127 mg/dL — ABNORMAL HIGH (ref 65–99)
Glucose-Capillary: 122 mg/dL — ABNORMAL HIGH (ref 65–99)

## 2017-07-31 MED ORDER — HYDROXYZINE HCL 25 MG PO TABS
25.0000 mg | ORAL_TABLET | Freq: Three times a day (TID) | ORAL | Status: DC | PRN
  Administered 2017-07-31 – 2017-08-02 (×4): 25 mg via ORAL
  Filled 2017-07-31 (×4): qty 1

## 2017-07-31 MED ORDER — QUETIAPINE FUMARATE 50 MG PO TABS
100.0000 mg | ORAL_TABLET | Freq: Every evening | ORAL | Status: DC | PRN
  Filled 2017-07-31: qty 2

## 2017-07-31 MED ORDER — QUETIAPINE FUMARATE 50 MG PO TABS
50.0000 mg | ORAL_TABLET | ORAL | Status: DC
  Administered 2017-07-31 (×2): 50 mg via ORAL
  Filled 2017-07-31 (×2): qty 1

## 2017-07-31 NOTE — Plan of Care (Signed)
  Activity: Risk for activity intolerance will decrease 07/31/2017 1358 - Progressing by Cathi Roan, RN   Pt out of bed to chair multiple times throughout shift. Pt ambulating around room, able to complete ADL's independently Coping: Level of anxiety will decrease 07/31/2017 1358 - Not Progressing by Cathi Roan, RN  Pt still complaining of increased anxiety - vistaril ordered and given. Will continue to monitor for anxiety needs

## 2017-07-31 NOTE — Consult Note (Signed)
[facility] Psych Consult Progress Note  07/31/2017 6:56 PM James Burke  MRN:  [id number] Subjective:   James Burke was sleep upon interview. He reports that he needs the sleep because for the past 2 nights he has not slept well. He denies feeling oversedated from Seroquel use. He does report a feeling of "electric shocks" in his legs that cause his legs to jerk. He believes that Ativan may have caused this. His symptoms improved today. He was informed about the risk for RLS with Seroquel and to notify his primary doctor if his symptoms worsen. His nurse reports that he was agitated overnight because he only received 100 mg of Seroquel instead of 200 mg. He was given 50 mg (x 3) today for anticipation of dialysis this afternoon. His primary team has discussed the risks of arrhythmias with him in the setting of prolonged QTc use. He would still like to receive the medication since it has been beneficial.   Principal Problem: Anxiety Diagnosis:   Patient Active Problem List   Diagnosis Date Noted  . Chest pain [R07.9] 07/25/2017  . Rib pain [R07.81] 07/25/2017  . Encounter for chronic pain management [G89.29] 06/15/2017  . Pericardial effusion without cardiac tamponade [I31.3]   . ESRD (end stage renal disease) ([place]) [N18.6]   . AKI (acute kidney injury) ([place]) [N17.9]   . Hypertensive heart and chronic kidney disease with heart failure and stage 1 through stage 4 chronic kidney disease, or chronic kidney disease ([place]) [I13.0]   . Acute on chronic combined systolic and diastolic congestive heart failure ([place]) [I50.43] 12/30/2016  . BPH (benign prostatic hyperplasia) [N40.0] 12/30/2016  . Charcot foot due to diabetes mellitus ([place]) [E11.610] 10/23/2015  . Nonischemic cardiomyopathy ([place]) [I42.8] 09/17/2014  . Insomnia [G47.00] 07/16/2014  . Pericardial effusion [I31.3] 06/25/2014  . Chronic combined systolic and diastolic heart failure ([place]) [I50.42] 04/17/2014  . Vitiligo [L80] 03/13/2014  .  Diabetic retinopathy associated with type 2 diabetes mellitus ([place]) [E11.319] 01/12/2012  . Hypertriglyceridemia [E78.1] 01/12/2012  . Essential hypertension, benign [I10] 01/12/2012  . Elevated LFTs [R94.5] 01/12/2012  . Controlled type 2 diabetes with renal manifestation ([place]) [E11.29] 01/03/2012  . Chronic systolic CHF (congestive heart failure), NYHA class 1 ([place]) [I50.22] 01/03/2012   Total Time spent with patient: 15 minutes  Past Psychiatric History: Denies  Past Medical History:  Past Medical History:  Diagnosis Date  . Anemia   . Arthritis, septic, knee ([place])   . C. difficile colitis 04/18/2008  . Congestive heart failure ([place])   . Diabetes mellitus    Type II  . DVT (deep venous thrombosis) (HCC)    right leg   . Dyspnea    "when I have too much fluid.'  . ESRD (end stage renal disease) ([place])    Tues, Thurs  . GERD (gastroesophageal reflux disease)   . History of blood transfusion 2017  . Pneumonia     Past Surgical History:  Procedure Laterality Date  . APPENDECTOMY  1995  . BASCILIC VEIN TRANSPOSITION Left 01/03/2017   Procedure: FIRST STAGE BRACHIAL VEIN TRANSPOSITION;  Surgeon: Conrad Argyle, MD;  Location: [place];  Service: Vascular;  Laterality: Left;  . BASCILIC VEIN TRANSPOSITION Left 04/24/2017   Procedure: LEFT SECOND STAGE BRACHIAL VEIN TRANSPOSITION;  Surgeon: Conrad Marietta, MD;  Location: [place];  Service: Vascular;  Laterality: Left;  . EYE SURGERY    . INSERTION OF DIALYSIS CATHETER Right 01/03/2017   Procedure: INSERTION OF DIALYSIS CATHETER RIGHT INTERNAL JUGULAR;  Surgeon: Conrad Snohomish, MD;  Location: [place];  Service: Vascular;  Laterality: Right;  . KNEE SURGERY Left 04/2014  . [place]  Bilateral   . PERICARDIOCENTESIS N/A 01/03/2017   Procedure: Pericardiocentesis;  Surgeon: Burnell Blanks, MD;  Location: [place] [facility];  Service: Cardiovascular;  Laterality: N/A;   Family History:  Family History  Problem Relation Age of Onset  . Heart  disease Unknown        No family history  . Cancer Brother    Family Psychiatric  History: Denies Social History:  Social History   Substance and Sexual Activity  Alcohol Use No  . Alcohol/week: 0.0 oz     Social History   Substance and Sexual Activity  Drug Use No    Social History   Socioeconomic History  . Marital status: Married    Spouse name: None  . Number of children: 3  . Years of education: None  . Highest education level: None  Social Needs  . Financial resource strain: None  . Food insecurity - worry: None  . Food insecurity - inability: None  . Transportation needs - medical: None  . Transportation needs - non-medical: None  Occupational History  . None  Tobacco Use  . Smoking status: Never Smoker  . Smokeless tobacco: Former Systems developer    Types: Chew  . Tobacco comment: no chewing 01/2017  Substance and Sexual Activity  . Alcohol use: No    Alcohol/week: 0.0 oz  . Drug use: No  . Sexual activity: Yes  Other Topics Concern  . None  Social History Narrative  . None    Sleep: Fair  Appetite:  Good  Current Medications: Current Facility-Administered Medications  Medication Dose Route Frequency Provider Last Rate Last Dose  . 0.9 %  sodium chloride infusion  250 mL Intravenous PRN Waldemar Dickens, MD      . acetaminophen (TYLENOL) tablet 1,000 mg  1,000 mg Oral Q6H PRN Waldemar Dickens, MD   1,000 mg at 07/29/17 1945   Or  . acetaminophen (TYLENOL) suppository 650 mg  650 mg Rectal Q6H PRN Waldemar Dickens, MD      . amLODipine (NORVASC) tablet 5 mg  5 mg Oral QHS Jamal Maes, MD   5 mg at 07/30/17 2305  . aspirin EC tablet 81 mg  81 mg Oral QHS Waldemar Dickens, MD   81 mg at 07/30/17 2305  . carvedilol (COREG) tablet 25 mg  25 mg Oral BID WC Waldemar Dickens, MD   25 mg at 07/31/17 1659  . diclofenac sodium (VOLTAREN) 1 % transdermal gel 4 g  4 g Topical QID PRN Waldemar Dickens, MD      . diphenhydrAMINE-zinc acetate (BENADRYL) 2-0.1 % cream    Topical BID PRN Regalado, Belkys A, MD      . docusate sodium (COLACE) capsule 100 mg  100 mg Oral QHS Waldemar Dickens, MD   100 mg at 07/30/17 2305  . ferric gluconate (NULECIT) 125 mg in sodium chloride 0.9 % 100 mL IVPB  125 mg Intravenous Q M,W,F-HD Jamal Maes, MD 110 mL/hr at 07/28/17 1112 125 mg at 07/28/17 1112  . heparin injection 5,000 Units  5,000 Units Subcutaneous Q8H Waldemar Dickens, MD      . hydrALAZINE (APRESOLINE) tablet 25 mg  25 mg Oral TID Waldemar Dickens, MD   25 mg at 07/31/17 0813  . hydrOXYzine (ATARAX/VISTARIL) tablet 25 mg  25 mg Oral  TID PRN Regalado, Belkys A, MD   25 mg at 07/31/17 1331  . insulin aspart (novoLOG) injection 0-5 Units  0-5 Units Subcutaneous QHS Waldemar Dickens, MD      . insulin aspart (novoLOG) injection 0-9 Units  0-9 Units Subcutaneous TID WC Waldemar Dickens, MD      . isosorbide mononitrate (IMDUR) 24 hr tablet 30 mg  30 mg Oral Daily Waldemar Dickens, MD   30 mg at 07/31/17 1700  . lidocaine (XYLOCAINE) 2 % jelly 1 application  1 application Topical TID PRN Waldemar Dickens, MD      . morphine 4 MG/ML injection 4 mg  4 mg Intravenous Q4H PRN Waldemar Dickens, MD   4 mg at 07/30/17 0601  . ondansetron (ZOFRAN) tablet 4 mg  4 mg Oral Q6H PRN Waldemar Dickens, MD       Or  . ondansetron [facility]) injection 4 mg  4 mg Intravenous Q6H PRN Waldemar Dickens, MD   4 mg at 07/26/17 1029  . oxyCODONE (Oxy IR/ROXICODONE) immediate release tablet 5-10 mg  5-10 mg Oral Q4H PRN Waldemar Dickens, MD   10 mg at 07/31/17 1757  . pantoprazole (PROTONIX) EC tablet 40 mg  40 mg Oral Daily Waldemar Dickens, MD   40 mg at 07/31/17 0813  . QUEtiapine (SEROQUEL) tablet 100 mg  100 mg Oral QHS Regalado, Belkys A, MD   100 mg at 07/30/17 2305  . QUEtiapine (SEROQUEL) tablet 100 mg  100 mg Oral QHS PRN Regalado, Belkys A, MD      . QUEtiapine (SEROQUEL) tablet 50-100 mg  50-100 mg Oral Q M,W,F Regalado, Belkys A, MD   50 mg at 07/31/17 1002  . sodium chloride flush  (NS) 0.9 % injection 3 mL  3 mL Intravenous Q12H Waldemar Dickens, MD   3 mL at 07/30/17 2306  . sodium chloride flush (NS) 0.9 % injection 3 mL  3 mL Intravenous PRN Waldemar Dickens, MD      . tamsulosin [facility]) capsule 0.4 mg  0.4 mg Oral Daily Waldemar Dickens, MD   0.4 mg at 07/31/17 1659    Lab Results:  Results for orders placed or performed during the hospital encounter of 07/25/17 (from the past 48 hour(s))  Glucose, capillary     Status: Abnormal   Collection Time: 07/29/17 10:11 PM  Result Value Ref Range   Glucose-Capillary 149 (H) 65 - 99 mg/dL  CBC     Status: Abnormal   Collection Time: 07/29/17 10:14 PM  Result Value Ref Range   WBC 6.0 4.0 - 10.5 K/uL   RBC 3.83 (L) 4.22 - 5.81 MIL/uL   Hemoglobin 10.9 (L) 13.0 - 17.0 g/dL   HCT 32.9 (L) 39.0 - 52.0 %   MCV 85.9 78.0 - 100.0 fL   MCH 28.5 26.0 - 34.0 pg   MCHC 33.1 30.0 - 36.0 g/dL   RDW 17.4 (H) 11.5 - 15.5 %   Platelets 161 150 - 400 K/uL  Glucose, capillary     Status: Abnormal   Collection Time: 07/30/17  7:30 AM  Result Value Ref Range   Glucose-Capillary 109 (H) 65 - 99 mg/dL  Glucose, capillary     Status: Abnormal   Collection Time: 07/30/17 11:28 AM  Result Value Ref Range   Glucose-Capillary 123 (H) 65 - 99 mg/dL  Glucose, capillary     Status: Abnormal   Collection Time: 07/30/17  4:45 PM  Result Value Ref Range   Glucose-Capillary 123 (H) 65 - 99 mg/dL  Glucose, capillary     Status: Abnormal   Collection Time: 07/30/17  9:27 PM  Result Value Ref Range   Glucose-Capillary 133 (H) 65 - 99 mg/dL   Comment 1 Notify RN   Glucose, capillary     Status: Abnormal   Collection Time: 07/31/17  7:49 AM  Result Value Ref Range   Glucose-Capillary 150 (H) 65 - 99 mg/dL  Glucose, capillary     Status: Abnormal   Collection Time: 07/31/17 11:31 AM  Result Value Ref Range   Glucose-Capillary 153 (H) 65 - 99 mg/dL  Glucose, capillary     Status: Abnormal   Collection Time: 07/31/17  4:27 PM  Result  Value Ref Range   Glucose-Capillary 127 (H) 65 - 99 mg/dL    Musculoskeletal: Strength & Muscle Tone: within normal limits Gait & Station: normal Patient leans: N/A  Psychiatric Specialty Exam: Physical Exam  Nursing note and vitals reviewed. Constitutional: He is oriented to person, place, and time. He appears well-developed and well-nourished.  HENT:  Head: Normocephalic and atraumatic.  Neck: Normal range of motion.  Respiratory: Effort normal.  Musculoskeletal: Normal range of motion.  Neurological: He is alert and oriented to person, place, and time.  Skin: No rash noted.  Psychiatric: He has a normal mood and affect. His behavior is normal. Judgment and thought content normal.    Review of Systems  Constitutional: Negative for chills and fever.  Eyes: Negative for blurred vision.  Gastrointestinal: Positive for constipation. Negative for diarrhea, nausea and vomiting.  Psychiatric/Behavioral: Negative for depression, hallucinations, substance abuse and suicidal ideas. The patient is nervous/anxious.        Anxiety/restlessness associated with dialysis treatment.     Blood pressure 123/79, pulse 72, temperature 97.6 F (36.4 C), temperature source Oral, resp. rate 18, height 6' (1.829 m), weight 87.3 kg (192 lb 7.4 oz), SpO2 100 %.Body mass index is 26.1 kg/m.  General Appearance: Well Groomed, middle aged, Caucasian male with low haircut and lying in bed. NAD.   Eye Contact:  Good  Speech:  Normal Rate  Volume:  Normal  Mood:  Okay  Affect:  Full Range  Thought Process:  Goal Directed and Linear  Orientation:  Full (Time, Place, and Person)  Thought Content:  Logical  Suicidal Thoughts:  No  Homicidal Thoughts:  No  Memory:  Immediate;   Good Recent;   Good Remote;   Good  Judgement:  Good  Insight:  Good  Psychomotor Activity:  Normal  Concentration:  Concentration: Good and Attention Span: Good  Recall:  Good  Fund of Knowledge:  Good  Language:  Good   Akathisia:  No  Handed:  Right  AIMS (if indicated):   N/A  Assets:  Agricultural consultant Housing Social Support  ADL's:  Intact  Cognition:  WNL  Sleep:    Fair    Assessment:  James Burke is a 51 y.o. male who was admitted with ESRD exacerbation. He has had significant difficulty tolerating HD treatments contributing to inadequate fluid coming off due to anxiety/restlessness associated with treatments. He also struggles with nausea and poor appetite contributing to missing HD treatments. He has been tried or fairly conservative measures including PO benadryl, PO Ativan, IV Ativan to assist with anxiety and nausea that were ineffective. He appears to tolerate Seroquel well but would advise caution with use due to risk for fatal arrhythmias since  QTc was borderline prolonged prior to use and now it is prolonged at 502 today. Consider alternative treatment for anxiety such as Gabapentin.    Treatment Plan Summary: -Would advise caution with Seroquel use in the setting of prolonged QTc given risk for fatal arrhythmias. Primary team has discussed this risk with the patient and he would like to continue use due to benefits.  -Consider alternative medication for anxiety/restlessness associated with hemodialysis treatments such as Gabapentin 100-300 mg daily PRN for anxiety/restlessness. Consult pharmacist to adjust dosing for ESRD. Could also use Trazodone 25-50 mg qhs PRN for sleep.  -Psychiatry will sign off patient at this time. Please consult psychiatry again as needed.    Faythe Dingwall, DO 07/31/2017, 6:56 PM

## 2017-07-31 NOTE — Progress Notes (Signed)
[facility] Progress Note  Subjective: no SOB or CP  Vitals:   07/30/17 1315 07/30/17 2124 07/30/17 2303 07/31/17 0627  BP: 122/89 124/83 122/85 125/89  Pulse:  79 79 78  Resp: 18 18  18   Temp: 97.9 F (36.6 C) 98.2 F (36.8 C)  98.3 F (36.8 C)  TempSrc: Oral Oral  Oral  SpO2: 100% 100%  100%  Weight:    87.3 kg (192 lb 7.4 oz)  Height:        Inpatient medications: . amLODipine  5 mg Oral QHS  . aspirin EC  81 mg Oral QHS  . carvedilol  25 mg Oral BID WC  . docusate sodium  100 mg Oral QHS  . heparin  5,000 Units Subcutaneous Q8H  . hydrALAZINE  25 mg Oral TID  . insulin aspart  0-5 Units Subcutaneous QHS  . insulin aspart  0-9 Units Subcutaneous TID WC  . isosorbide mononitrate  30 mg Oral Daily  . pantoprazole  40 mg Oral Daily  . QUEtiapine  100 mg Oral QHS  . QUEtiapine  50-100 mg Oral Q M,W,F  . sodium chloride flush  3 mL Intravenous Q12H  . tamsulosin  0.4 mg Oral Daily   . sodium chloride    . ferric gluconate (FERRLECIT/NULECIT) IV 125 mg (07/28/17 1112)   sodium chloride, acetaminophen **OR** acetaminophen, diclofenac sodium, diphenhydrAMINE-zinc acetate, lidocaine, morphine injection, ondansetron **OR** ondansetron (ZOFRAN) IV, oxyCODONE, sodium chloride flush  Exam: NAD  Chest clear RRR no mrg ABd less tight ascites, nontender Ext 1-2 + pitting edema bilat LE's R IJ TDC LUA AVF +bruit  Dialysis:  [facility] MWF 4h   87kg   2/2 bath  Hep none   TDC/ maturing AVF  Hep none -Mircera 75 mcg IV q 2 week (recently resumed not given yet) -Venofer 100 mg IV X 10 (5/5 doses given-last dose 07/17/17 Fe 52 Tsat 21% 07/03/17) -Hectoral 1 mcg IV TIW (Last PTH 18!) BMD meds: Calcium acetate 667 mg PO TID AC (last Phos 3.2 Ca 10.4 07/03/17)        Impression: 1. Vol overload/ anasarca - due to noncompliance w/ HD.  15kg removed w HD x 4 here, down to prior dry wt 87kg. Still has vol excess on exam 2. ESRD on HD MWF 3. Severe anxiety- on seroquel  now, had bad reaction to Ativan. Per psych/ primary 4. Anemia of CKD - Hb 9- 11, cont esa 5. MBD - cont binders, dc vit D (low pth) 6. DM per primary 7. Nut'n- renal carb mod diet  Plan - HD today, lower dry wt down   Kelly Splinter MD [place] pager [phone]   07/31/2017, 10:14 AM   Recent Labs  Lab 07/26/17 0400 07/28/17 0844 07/29/17 1810  NA 135 132* 129*  K 3.9 4.5 4.2  CL 98* 97* 93*  CO2 24 24 25   GLUCOSE 121* 155* 185*  BUN 58* 45* 34*  CREATININE 7.06* 6.09* 5.32*  CALCIUM 8.3* 8.0* 8.1*  PHOS  --  2.7 2.4*   Recent Labs  Lab 07/25/17 1110 07/26/17 0400 07/28/17 0844 07/29/17 1810  AST 25 25  --   --   ALT 22 21  --   --   ALKPHOS 138* 131*  --   --   BILITOT 0.7 1.0  --   --   PROT 7.6 6.8  --   --   ALBUMIN 3.7 3.3* 3.1* 3.2*   Recent Labs  Lab  07/26/17 0400 07/28/17 0350 07/29/17 2214  WBC 6.6 5.6 6.0  HGB 9.7* 10.3* 10.9*  HCT 29.0* 31.3* 32.9*  MCV 86.6 86.7 85.9  PLT 164 168 161   Iron/TIBC/Ferritin/ %Sat    Component Value Date/Time   FERRITIN 111 01/05/2017 1101

## 2017-07-31 NOTE — Progress Notes (Signed)
PROGRESS NOTE    James Burke  LPF:[id number] DOB: 02/01/1966 DOA: 07/25/2017 PCP: [name], MD    Brief Narrative: James Burke is a 51 y.o. male with medical history significant of C. difficile colitis, CHF, diabetes, DVT, ESRD on dialysis, GERD.  Patient presents after being told by his dialysis center to come to the hospital due to missing dialysis for more than a week. Patient endorses missing his dialysis sessions due to intermittent episodes of nausea and vomiting with eating making her feel too weak to be able to go to dialysis. Patient typically dialyzes on [name]. Patient denies abdominal pain, diarrhea, fevers, flank pain, neck stiffness, headache, focal neurological deficits, rash, palpitations, shortness of breath. Symptoms of nausea vomiting and food intolerance are improving without intervention.  L rib cage pain: Patient reports falling approximately 7 days ago. Mechanical fall. Pain throughout left lower rib area since that time. Constant. Worse with. Improves with home opioids. Denies palpitations, substernal chest pain with associated nausea and shortness of breath or radiation. Some relief also with icing area.  Assessment & Plan:   Principal Problem:   ESRD (end stage renal disease) ([place]) Active Problems:   Controlled type 2 diabetes with renal manifestation (HCC)   Chronic systolic CHF (congestive heart failure), NYHA class 1 (HCC)   Essential hypertension, benign   Charcot foot due to diabetes mellitus (HCC)   Chest pain   Rib pain  1-ESRD; volume overload;  Had HD again 11-21, 11-23-, 11-24 Appreciate nephrology help.  Volume status improving.  Weight 209----198--192 For HD 11-26 Started on Seroquel at [facility]. Also 50 to 100 mg prior to HD M, W, F PRN for anxiety. Monitor QT.   2-Depression, anxiety;   Appreciate Dr Daron Offer recommendation.  Started  Seroquel at HS 100 mg, if doesn't help ok to give another 100.  Marland Kitchen And  PRN Seroquel 50 mg prior to HD.  3-Chest wall, Ribs pain.  X ary negative for fracture.  Pain management.  CT abdomen negative for hematoma, has massive ascites.   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 No evidence of acute decompensation. - Continue beta blocker -I/O, daily weights  6-Massive ascites; discussed with Dr Lorrene Reid, will ask fro US paracentesis. Therapeutic.  Will give ativan prior to procedure.   N/V:  - Zofran PRN - suspect related to uremia.   DM: on trulicity. Last injection 07/22/17 - SSI refuse insulin coverage.   GERD: - continue PPI     DVT prophylaxis: Heparin Code Status: Full code.  Family Communication: care discussed with patient.  Disposition Plan: to be determine   Consultants:   Nephrology    Procedures: HD   Antimicrobials: none   Subjective: He was not able to sleep last night. He is very anxious, fidgety.   Objective: Vitals:   07/30/17 1315 07/30/17 2124 07/30/17 2303 07/31/17 0627  BP: 122/89 124/83 122/85 125/89  Pulse:  79 79 78  Resp: 18 18  18   Temp: 97.9 F (36.6 C) 98.2 F (36.8 C)  98.3 F (36.8 C)  TempSrc: Oral Oral  Oral  SpO2: 100% 100%  100%  Weight:    87.3 kg (192 lb 7.4 oz)  Height:        Intake/Output Summary (Last 24 hours) at 07/31/2017 1424 Last data filed at 07/30/2017 2300 Gross per 24 hour  Intake 240  ml  Output 100 ml  Net 140 ml   Filed Weights   07/29/17 1742 07/29/17 2151 07/31/17 0627  Weight: 91.1 kg (200 lb 13.4 oz) 87.1 kg (192 lb 0.3 oz) 87.3 kg (192 lb 7.4 oz)    Examination:  General exam: NAD Respiratory system: Normal respiratory effort.  Cardiovascular system: S 1, S 2 RRR, plus 2 edema Gastrointestinal system: BS present, soft, nt Central nervous system: non focal.  Extremities: symmetric power.     Data Reviewed: I  have personally reviewed following labs and imaging studies  CBC: Recent Labs  Lab 07/25/17 1110 07/26/17 0400 07/28/17 0350 07/29/17 2214  WBC 7.3 6.6 5.6 6.0  HGB 10.5* 9.7* 10.3* 10.9*  HCT 31.5* 29.0* 31.3* 32.9*  MCV 85.8 86.6 86.7 85.9  PLT 190 164 168 329   Basic Metabolic Panel: Recent Labs  Lab 07/25/17 1110 07/26/17 0400 07/28/17 0844 07/29/17 1810  NA 135 135 132* 129*  K 5.1 3.9 4.5 4.2  CL 96* 98* 97* 93*  CO2 22 24 24 25   GLUCOSE 124* 121* 155* 185*  BUN 112* 58* 45* 34*  CREATININE 10.64* 7.06* 6.09* 5.32*  CALCIUM 9.2 8.3* 8.0* 8.1*  PHOS  --   --  2.7 2.4*   GFR: Estimated Creatinine Clearance: 18.4 mL/min (A) (by C-G formula based on SCr of 5.32 mg/dL (H)). Liver Function Tests: Recent Labs  Lab 07/25/17 1110 07/26/17 0400 07/28/17 0844 07/29/17 1810  AST 25 25  --   --   ALT 22 21  --   --   ALKPHOS 138* 131*  --   --   BILITOT 0.7 1.0  --   --   PROT 7.6 6.8  --   --   ALBUMIN 3.7 3.3* 3.1* 3.2*   Recent Labs  Lab 07/25/17 1110  LIPASE 42   No results for input(s): AMMONIA in the last 168 hours. Coagulation Profile: No results for input(s): INR, PROTIME in the last 168 hours. Cardiac Enzymes: No results for input(s): CKTOTAL, CKMB, CKMBINDEX, TROPONINI in the last 168 hours. BNP (last 3 results) No results for input(s): PROBNP in the last 8760 hours. HbA1C: No results for input(s): HGBA1C in the last 72 hours. CBG: Recent Labs  Lab 07/30/17 1128 07/30/17 1645 07/30/17 2127 07/31/17 0749 07/31/17 1131  GLUCAP 123* 123* 133* 150* 153*   Lipid Profile: No results for input(s): CHOL, HDL, LDLCALC, TRIG, CHOLHDL, LDLDIRECT in the last 72 hours. Thyroid Function Tests: No results for input(s): TSH, T4TOTAL, FREET4, T3FREE, THYROIDAB in the last 72 hours. Anemia Panel: No results for input(s): VITAMINB12, FOLATE, FERRITIN, TIBC, IRON, RETICCTPCT in the last 72 hours. Sepsis Labs: No results for input(s): PROCALCITON,  LATICACIDVEN in the last 168 hours.  No results found for this or any previous visit (from the past 240 hour(s)).       Radiology Studies: US Paracentesis  Result Date: 07/30/2017 INDICATION: ascites EXAM: ULTRASOUND-GUIDED PARACENTESIS COMPARISON:  None. MEDICATIONS: 10 cc 1% lidocaine. COMPLICATIONS: None immediate. TECHNIQUE: Informed written consent was obtained from the patient after a discussion of the risks, benefits and alternatives to treatment. A timeout was performed prior to the initiation of the procedure. Initial ultrasound scanning demonstrates a large amount of ascites within the right lower abdominal quadrant. The right lower abdomen was prepped and draped in the usual sterile fashion. 1% lidocaine with epinephrine was used for local anesthesia. Under direct ultrasound guidance, a 19 gauge, 7-cm, Yueh catheter was introduced. An ultrasound image was saved  for documentation purposed. The paracentesis was performed. The catheter was removed and a dressing was applied. The patient tolerated the procedure well without immediate post procedural complication. FINDINGS: A total of approximately 4.6 liters of amber fluid was removed. IMPRESSION: Successful ultrasound-guided paracentesis yielding 4.6 liters of peritoneal fluid. Read by Lavonia Drafts [facility] Electronically Signed   By: Corrie Mckusick D.O.   On: 07/29/2017 14:17        Scheduled Meds: . amLODipine  5 mg Oral QHS  . aspirin EC  81 mg Oral QHS  . carvedilol  25 mg Oral BID WC  . docusate sodium  100 mg Oral QHS  . heparin  5,000 Units Subcutaneous Q8H  . hydrALAZINE  25 mg Oral TID  . insulin aspart  0-5 Units Subcutaneous QHS  . insulin aspart  0-9 Units Subcutaneous TID WC  . isosorbide mononitrate  30 mg Oral Daily  . pantoprazole  40 mg Oral Daily  . QUEtiapine  100 mg Oral QHS  . QUEtiapine  50-100 mg Oral Q M,W,F  . sodium chloride flush  3 mL Intravenous Q12H  . tamsulosin  0.4 mg Oral Daily   Continuous  Infusions: . sodium chloride    . ferric gluconate (FERRLECIT/NULECIT) IV 125 mg (07/28/17 1112)     LOS: 6 days    Time spent: 35 minutes.     Elmarie Shiley, MD [facility] Pager [phone]  If 7PM-7AM, please contact night-coverage [email or web address] Password [email or web address] 07/31/2017, 2:24 PM

## 2017-07-31 NOTE — Progress Notes (Signed)
During bedside report, patient very anxious and fidgety. Pt continuing to state that he was "suppose to have received 200mg  of Seroquel last night, but only received 100mg . It did nothing to calm me or help me sleep. I'm not going to dialysis like this today unless yall get this under control." Off going nurse attempted to explain why the Seroquel dose was reduced however patient continued to repeatedly cut the nurse off during multiple attempts of explanation. This RN re-rounded on patient after speaking with Dr. Tyrell Antonio regarding the above concerns. Stated to go ahead and give morning dose of Seroquel and she will re-evaluate later this morning. Explained to patient the plan - seroquel given. Also spoke with dialysis who stated patient was scheduled for 2nd shift, meaning he would be picked up between 12:00 and 2:00pm. Pt updated on this also. Will continue to monitor closely.

## 2017-08-01 LAB — BASIC METABOLIC PANEL
Anion gap: 10 (ref 5–15)
BUN: 23 mg/dL — ABNORMAL HIGH (ref 6–20)
CO2: 27 mmol/L (ref 22–32)
Calcium: 8.3 mg/dL — ABNORMAL LOW (ref 8.9–10.3)
Chloride: 96 mmol/L — ABNORMAL LOW (ref 101–111)
Creatinine, Ser: 4.46 mg/dL — ABNORMAL HIGH (ref 0.61–1.24)
GFR calc Af Amer: 16 mL/min — ABNORMAL LOW (ref >60)
GFR calc non Af Amer: 14 mL/min — ABNORMAL LOW (ref >60)
Glucose, Bld: 109 mg/dL — ABNORMAL HIGH (ref 65–99)
Potassium: 3.8 mmol/L (ref 3.5–5.1)
Sodium: 133 mmol/L — ABNORMAL LOW (ref 135–145)

## 2017-08-01 LAB — GLUCOSE, CAPILLARY
Glucose-Capillary: 153 mg/dL — ABNORMAL HIGH (ref 65–99)
Glucose-Capillary: 141 mg/dL — ABNORMAL HIGH (ref 65–99)
Glucose-Capillary: 144 mg/dL — ABNORMAL HIGH (ref 65–99)
Glucose-Capillary: 126 mg/dL — ABNORMAL HIGH (ref 65–99)

## 2017-08-01 LAB — MAGNESIUM: Magnesium: 2 mg/dL (ref 1.7–2.4)

## 2017-08-01 MED ORDER — TRAZODONE HCL 50 MG PO TABS
50.0000 mg | ORAL_TABLET | Freq: Every day | ORAL | Status: DC
  Administered 2017-08-01: 50 mg via ORAL
  Filled 2017-08-01: qty 1

## 2017-08-01 MED ORDER — GABAPENTIN 100 MG PO CAPS
200.0000 mg | ORAL_CAPSULE | Freq: Every day | ORAL | Status: DC | PRN
  Administered 2017-08-02: 200 mg via ORAL
  Filled 2017-08-01: qty 2

## 2017-08-01 MED ORDER — OXYCODONE HCL 5 MG PO TABS
ORAL_TABLET | ORAL | Status: AC
  Filled 2017-08-01: qty 2

## 2017-08-01 NOTE — Progress Notes (Signed)
[facility] Progress Note  Subjective: no SOB or CP  Vitals:   08/01/17 0444 08/01/17 0530 08/01/17 0741 08/01/17 0900  BP: 127/74 125/76 117/71 126/78  Pulse: 73 76    Resp: 18     Temp: 97.8 F (36.6 C) 98.2 F (36.8 C)    TempSrc: Oral Oral    SpO2: 95% 99%    Weight: 84.7 kg (186 lb 11.7 oz)     Height:        Inpatient medications: . amLODipine  5 mg Oral QHS  . aspirin EC  81 mg Oral QHS  . carvedilol  25 mg Oral BID WC  . docusate sodium  100 mg Oral QHS  . heparin  5,000 Units Subcutaneous Q8H  . hydrALAZINE  25 mg Oral TID  . insulin aspart  0-5 Units Subcutaneous QHS  . insulin aspart  0-9 Units Subcutaneous TID WC  . isosorbide mononitrate  30 mg Oral Daily  . pantoprazole  40 mg Oral Daily  . QUEtiapine  100 mg Oral QHS  . QUEtiapine  50-100 mg Oral Q M,W,F  . sodium chloride flush  3 mL Intravenous Q12H  . tamsulosin  0.4 mg Oral Daily   . sodium chloride    . ferric gluconate (FERRLECIT/NULECIT) IV 125 mg (08/01/17 0344)   sodium chloride, acetaminophen **OR** acetaminophen, diclofenac sodium, diphenhydrAMINE-zinc acetate, hydrOXYzine, lidocaine, morphine injection, ondansetron **OR** ondansetron (ZOFRAN) IV, oxyCODONE, QUEtiapine, sodium chloride flush  Exam: NAD  Chest clear RRR no mrg ABd less tight ascites, nontender Ext 1-2 + pitting edema bilat LE's R IJ TDC LUA AVF +bruit  Dialysis:  [facility] MWF 4h   87kg   2/2 bath  Hep none   TDC/ maturing AVF  Hep none -Mircera 75 mcg IV q 2 week (recently resumed not given yet) -Venofer 100 mg IV X 10 (5/5 doses given-last dose 07/17/17 Fe 52 Tsat 21% 07/03/17) -Hectoral 1 mcg IV TIW (Last PTH 18!) BMD meds: Calcium acetate 667 mg PO TID AC (last Phos 3.2 Ca 10.4 07/03/17)        Impression: 1. Vol overload/ anasarca - due to noncompliance w/ HD.  Under dry wt , still sig edema -  lowering dry wt now.  2. ESRD on HD MWF. Had HD last night.  3. Severe anxiety- on seroquel now, had bad  reaction to Ativan. Per psych/ primary 4. Anemia of CKD - Hb 9- 11, cont esa 5. MBD - cont binders, dc vit D (low pth) 6. DM per primary 7. Nut'n- renal carb mod diet 8. Dispo - per primary, stable for d/c from renal standpoint   Plan - HD Wed if still here. Lowering dry wt.    Kelly Splinter MD [facility] pager [phone]   08/01/2017, 12:25 PM   Recent Labs  Lab 07/28/17 0844 07/29/17 1810 07/31/17 2053 08/01/17 0537  NA 132* 129* 130* 133*  K 4.5 4.2 4.0 3.8  CL 97* 93* 94* 96*  CO2 24 25 26 27   GLUCOSE 155* 185* 119* 109*  BUN 45* 34* 33* 23*  CREATININE 6.09* 5.32* 5.88* 4.46*  CALCIUM 8.0* 8.1* 8.3* 8.3*  PHOS 2.7 2.4* 3.0  --    Recent Labs  Lab 07/26/17 0400 07/28/17 0844 07/29/17 1810 07/31/17 2053  AST 25  --   --   --   ALT 21  --   --   --   ALKPHOS 131*  --   --   --  BILITOT 1.0  --   --   --   PROT 6.8  --   --   --   ALBUMIN 3.3* 3.1* 3.2* 3.0*   Recent Labs  Lab 07/26/17 0400 07/28/17 0350 07/29/17 2214  WBC 6.6 5.6 6.0  HGB 9.7* 10.3* 10.9*  HCT 29.0* 31.3* 32.9*  MCV 86.6 86.7 85.9  PLT 164 168 161   Iron/TIBC/Ferritin/ %Sat    Component Value Date/Time   FERRITIN 111 01/05/2017 1101

## 2017-08-01 NOTE — Progress Notes (Addendum)
PROGRESS NOTE    James Burke  GDJ:[id number] DOB: [birth year] DOA: 07/25/2017 PCP: [name], MD    Brief Narrative: James Burke is a 51 y.o. male with medical history significant of C. difficile colitis, CHF, diabetes, DVT, ESRD on dialysis, GERD.  Patient presents after being told by his dialysis center to come to the hospital due to missing dialysis for more than a week. Patient endorses missing his dialysis sessions due to intermittent episodes of nausea and vomiting with eating making her feel too weak to be able to go to dialysis. Also he is not able to sit still for 4 hours, he gets anxious. Patient typically dialyzes on Monday Wednesday and Friday. Patient denies abdominal pain, diarrhea, fevers, flank pain, neck stiffness, headache, focal neurological deficits, rash, palpitations, shortness of breath. Symptoms of nausea vomiting and food intolerance are improving without intervention.  L rib cage pain: Patient reports falling approximately 7 days ago. Mechanical fall. Pain throughout left lower rib area since that time. Constant. Worse with. Improves with home opioids. Denies palpitations, substernal chest pain with associated nausea and shortness of breath or radiation. Some relief also with icing area.  Admitted with volume overload, anasarca, uremic from missing HD. Psych was consulted for medications management for anxiety during HD.   Assessment & Plan:   Principal Problem:   Anxiety Active Problems:   Controlled type 2 diabetes with renal manifestation (HCC)   Chronic systolic CHF (congestive heart failure), NYHA class 1 (HCC)   Essential hypertension, benign   Charcot foot due to diabetes mellitus (HCC)   ESRD (end stage renal disease) (HCC)   Chest pain   Rib pain  1-ESRD; volume overload;  Had HD again 11-21, 11-23-, 11-24, 11-26 Appreciate nephrology help.  Volume status improving. Still with significant Lower extremities edema.  Weight  209----198--192--186 For HD 11-27  2-Depression, anxiety;  Appreciate Dr Mariea Clonts recommendation.  He received Seroquel yesterday. He doesn't know if medication help, he finished  HD at 4 am.  Due to concern with prolong QT, with Seroquel. Will stop Seroquel and will try trazodone schedule at hs to help with sleep and will order gabapentin prior to HD to help with anxiety.    3-Chest wall, Ribs pain.  X ary negative for fracture.  Pain management.  CT abdomen negative for hematoma, has massive ascites.   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 No evidence of acute decompensation. - Continue beta blocker -I/O, daily weights  6-Massive ascites; discussed with Dr Lorrene Reid, will ask fro US paracentesis. Therapeutic.  Will give ativan prior to procedure.   N/V:  - Zofran PRN - suspect related to uremia.  resolved.   DM: on trulicity. Last injection 07/22/17 - SSI refuse insulin coverage.   GERD: - continue PPI     DVT prophylaxis: Heparin Code Status: Full code.  Family Communication: care discussed with patient.  Disposition Plan: need to see how he does with new medications regimen during HD. And monitor for adverse effect/    Consultants:   Nephrology    Procedures: HD   Antimicrobials: none   Subjective: He was not able to sleep last night, he was in HD . They came to get him really late last night.  He doesn't know if Seroquel help.  He is willing to try trazodone.   Objective: Vitals:   08/01/17 0530 08/01/17 0741  08/01/17 0900 08/01/17 1327  BP: 125/76 117/71 126/78 117/67  Pulse: 76   75  Resp:    16  Temp: 98.2 F (36.8 C)   97.8 F (36.6 C)  TempSrc: Oral   Oral  SpO2: 99%   99%  Weight:      Height:        Intake/Output Summary (Last 24 hours) at 08/01/2017 1452 Last data filed at 08/01/2017  1325 Gross per 24 hour  Intake 823 ml  Output 3930 ml  Net -3107 ml   Filed Weights   07/31/17 0627 08/01/17 0135 08/01/17 0444  Weight: 87.3 kg (192 lb 7.4 oz) 88.5 kg (195 lb 1.7 oz) 84.7 kg (186 lb 11.7 oz)    Examination:  General exam: NAD Respiratory system: Crackles bases.  Cardiovascular system: S 1, S 2 RRR plus 2 edema Gastrointestinal system: BS present, soft, nt Central nervous system: non focal.  Extremities: Symmetric power.     Data Reviewed: I have personally reviewed following labs and imaging studies  CBC: Recent Labs  Lab 07/26/17 0400 07/28/17 0350 07/29/17 2214  WBC 6.6 5.6 6.0  HGB 9.7* 10.3* 10.9*  HCT 29.0* 31.3* 32.9*  MCV 86.6 86.7 85.9  PLT 164 168 654   Basic Metabolic Panel: Recent Labs  Lab 07/26/17 0400 07/28/17 0844 07/29/17 1810 07/31/17 2053 08/01/17 0537  NA 135 132* 129* 130* 133*  K 3.9 4.5 4.2 4.0 3.8  CL 98* 97* 93* 94* 96*  CO2 24 24 25 26 27   GLUCOSE 121* 155* 185* 119* 109*  BUN 58* 45* 34* 33* 23*  CREATININE 7.06* 6.09* 5.32* 5.88* 4.46*  CALCIUM 8.3* 8.0* 8.1* 8.3* 8.3*  MG  --   --   --   --  2.0  PHOS  --  2.7 2.4* 3.0  --    GFR: Estimated Creatinine Clearance: 22 mL/min (A) (by C-G formula based on SCr of 4.46 mg/dL (H)). Liver Function Tests: Recent Labs  Lab 07/26/17 0400 07/28/17 0844 07/29/17 1810 07/31/17 2053  AST 25  --   --   --   ALT 21  --   --   --   ALKPHOS 131*  --   --   --   BILITOT 1.0  --   --   --   PROT 6.8  --   --   --   ALBUMIN 3.3* 3.1* 3.2* 3.0*   No results for input(s): LIPASE, AMYLASE in the last 168 hours. No results for input(s): AMMONIA in the last 168 hours. Coagulation Profile: No results for input(s): INR, PROTIME in the last 168 hours. Cardiac Enzymes: No results for input(s): CKTOTAL, CKMB, CKMBINDEX, TROPONINI in the last 168 hours. BNP (last 3 results) No results for input(s): PROBNP in the last 8760 hours. HbA1C: No results for input(s): HGBA1C in the  last 72 hours. CBG: Recent Labs  Lab 07/31/17 1131 07/31/17 1627 07/31/17 2120 08/01/17 0748 08/01/17 1106  GLUCAP 153* 127* 122* 153* 141*   Lipid Profile: No results for input(s): CHOL, HDL, LDLCALC, TRIG, CHOLHDL, LDLDIRECT in the last 72 hours. Thyroid Function Tests: No results for input(s): TSH, T4TOTAL, FREET4, T3FREE, THYROIDAB in the last 72 hours. Anemia Panel: No results for input(s): VITAMINB12, FOLATE, FERRITIN, TIBC, IRON, RETICCTPCT in the last 72 hours. Sepsis Labs: No results for input(s): PROCALCITON, LATICACIDVEN in the last 168 hours.  No results found for this or any previous visit (from the past 240 hour(s)).  Radiology Studies: No results found.      Scheduled Meds: . amLODipine  5 mg Oral QHS  . aspirin EC  81 mg Oral QHS  . carvedilol  25 mg Oral BID WC  . docusate sodium  100 mg Oral QHS  . heparin  5,000 Units Subcutaneous Q8H  . hydrALAZINE  25 mg Oral TID  . insulin aspart  0-5 Units Subcutaneous QHS  . insulin aspart  0-9 Units Subcutaneous TID WC  . isosorbide mononitrate  30 mg Oral Daily  . pantoprazole  40 mg Oral Daily  . sodium chloride flush  3 mL Intravenous Q12H  . tamsulosin  0.4 mg Oral Daily  . traZODone  50 mg Oral QHS   Continuous Infusions: . sodium chloride    . ferric gluconate (FERRLECIT/NULECIT) IV 125 mg (08/01/17 0344)     LOS: 7 days    Time spent: 35 minutes.     Elmarie Shiley, MD [facility] Pager [phone]  If 7PM-7AM, please contact night-coverage [email or web address] Password [email or web address] 08/01/2017, 2:52 PM

## 2017-08-02 DIAGNOSIS — R0781 Pleurodynia: Secondary | ICD-10-CM

## 2017-08-02 DIAGNOSIS — R0789 Other chest pain: Secondary | ICD-10-CM

## 2017-08-02 DIAGNOSIS — E1122 Type 2 diabetes mellitus with diabetic chronic kidney disease: Secondary | ICD-10-CM

## 2017-08-02 DIAGNOSIS — Z794 Long term (current) use of insulin: Secondary | ICD-10-CM

## 2017-08-02 DIAGNOSIS — E11610 Type 2 diabetes mellitus with diabetic neuropathic arthropathy: Secondary | ICD-10-CM

## 2017-08-02 DIAGNOSIS — I5022 Chronic systolic (congestive) heart failure: Secondary | ICD-10-CM

## 2017-08-02 DIAGNOSIS — I10 Essential (primary) hypertension: Secondary | ICD-10-CM

## 2017-08-02 DIAGNOSIS — F419 Anxiety disorder, unspecified: Secondary | ICD-10-CM

## 2017-08-02 DIAGNOSIS — R112 Nausea with vomiting, unspecified: Secondary | ICD-10-CM

## 2017-08-02 DIAGNOSIS — E8779 Other fluid overload: Secondary | ICD-10-CM

## 2017-08-02 DIAGNOSIS — R140 Abdominal distension (gaseous): Secondary | ICD-10-CM

## 2017-08-02 LAB — GLUCOSE, CAPILLARY: Glucose-Capillary: 161 mg/dL — ABNORMAL HIGH (ref 65–99)

## 2017-08-02 MED ORDER — HEPARIN SODIUM (PORCINE) 1000 UNIT/ML DIALYSIS: 1000.0000 [IU] | INTRAMUSCULAR | Status: DC | PRN

## 2017-08-02 MED ORDER — SODIUM CHLORIDE 0.9 % IV SOLN: 100.0000 mL | INTRAVENOUS | Status: DC | PRN

## 2017-08-02 MED ORDER — PENTAFLUOROPROP-TETRAFLUOROETH EX AERO: 1.0000 "application " | INHALATION_SPRAY | CUTANEOUS | Status: DC | PRN

## 2017-08-02 MED ORDER — LIDOCAINE HCL (PF) 1 % IJ SOLN: 5.0000 mL | INTRAMUSCULAR | Status: DC | PRN

## 2017-08-02 MED ORDER — HEPARIN SODIUM (PORCINE) 1000 UNIT/ML DIALYSIS
1000.0000 [IU] | INTRAMUSCULAR | Status: DC | PRN
  Filled 2017-08-02: qty 1

## 2017-08-02 MED ORDER — TRAZODONE HCL 50 MG PO TABS: 50.0000 mg | ORAL_TABLET | Freq: Every day | ORAL | 0 refills | Status: DC

## 2017-08-02 MED ORDER — MORPHINE SULFATE (PF) 4 MG/ML IV SOLN
INTRAVENOUS | Status: AC
  Administered 2017-08-02: 4 mg via INTRAVENOUS
  Filled 2017-08-02: qty 1

## 2017-08-02 MED ORDER — LIDOCAINE-PRILOCAINE 2.5-2.5 % EX CREA: 1.0000 "application " | TOPICAL_CREAM | CUTANEOUS | Status: DC | PRN

## 2017-08-02 MED ORDER — ALTEPLASE 2 MG IJ SOLR: 2.0000 mg | Freq: Once | INTRAMUSCULAR | Status: DC | PRN

## 2017-08-02 MED ORDER — GABAPENTIN 100 MG PO CAPS: 200.0000 mg | ORAL_CAPSULE | Freq: Every day | ORAL | 0 refills | Status: DC | PRN

## 2017-08-02 NOTE — Progress Notes (Signed)
[facility] Progress Note  Subjective: no SOB or CP. On HD now.  Seroquel dc'd d/t prolonged QTc.  Started on neurontin.    Vitals:   08/02/17 0830 08/02/17 0900 08/02/17 0930 08/02/17 1000  BP: 138/84 132/82 116/78 125/80  Pulse: 76 74 72 75  Resp:      Temp:      TempSrc:      SpO2:      Weight:      Height:        Inpatient medications: . amLODipine  5 mg Oral QHS  . aspirin EC  81 mg Oral QHS  . carvedilol  25 mg Oral BID WC  . docusate sodium  100 mg Oral QHS  . heparin  5,000 Units Subcutaneous Q8H  . hydrALAZINE  25 mg Oral TID  . insulin aspart  0-5 Units Subcutaneous QHS  . insulin aspart  0-9 Units Subcutaneous TID WC  . isosorbide mononitrate  30 mg Oral Daily  . pantoprazole  40 mg Oral Daily  . sodium chloride flush  3 mL Intravenous Q12H  . tamsulosin  0.4 mg Oral Daily  . traZODone  50 mg Oral QHS   . sodium chloride    . sodium chloride    . sodium chloride    . ferric gluconate (FERRLECIT/NULECIT) IV Stopped (08/02/17 0940)   sodium chloride, sodium chloride, sodium chloride, acetaminophen **OR** acetaminophen, alteplase, diclofenac sodium, diphenhydrAMINE-zinc acetate, gabapentin, heparin, hydrOXYzine, lidocaine (PF), lidocaine, lidocaine-prilocaine, morphine injection, ondansetron **OR** ondansetron (ZOFRAN) IV, oxyCODONE, pentafluoroprop-tetrafluoroeth, sodium chloride flush  Exam: NAD  Chest clear RRR no mrg ABd less tight ascites, nontender Ext 1-2 + pitting edema bilat LE's R IJ TDC LUA AVF +bruit  Dialysis:  [facility] MWF 4h   87kg   2/2 bath  Hep none   TDC/ maturing AVF  Hep none -Mircera 75 mcg IV q 2 week (recently resumed not given yet) -Venofer 100 mg IV X 10 (5/5 doses given-last dose 07/17/17 Fe 52 Tsat 21% 07/03/17) -Hectoral 1 mcg IV TIW (Last PTH 18!) BMD meds: Calcium acetate 667 mg PO TID AC (last Phos 3.2 Ca 10.4 07/03/17)        Impression: 1. Anxiety/ restlessness - bad rxn to Ativan, seroquel stopped d/t  ^QTc. Now on neurontin.  Looks better overall. Per psych/ primary 2. Vol overload/ anasarca - due to noncompliance w/ HD.  Under dry wt but diffuse pitting edema persists, still has much volume on board.  3. ESRD on HD MWF. HD today.  4. Anemia of CKD - Hb 9- 11, cont esa 5. MBD - cont binders, dc vit D (low pth) 6. DM per primary 7. Nut'n- renal carb mod diet 8. Dispo - stable for d/c from renal standpoint   Plan - HD MWF while here, cont to lower volume and dry wt maximally as tolerated. Stable for d/c from renal standpoint.   Kelly Splinter MD [facility] pager [phone]   08/02/2017, 10:30 AM   Recent Labs  Lab 07/28/17 0844 07/29/17 1810 07/31/17 2053 08/01/17 0537  NA 132* 129* 130* 133*  K 4.5 4.2 4.0 3.8  CL 97* 93* 94* 96*  CO2 24 25 26 27   GLUCOSE 155* 185* 119* 109*  BUN 45* 34* 33* 23*  CREATININE 6.09* 5.32* 5.88* 4.46*  CALCIUM 8.0* 8.1* 8.3* 8.3*  PHOS 2.7 2.4* 3.0  --    Recent Labs  Lab 07/28/17 0844 07/29/17 1810 07/31/17 2053  ALBUMIN 3.1* 3.2* 3.0*  Recent Labs  Lab 07/28/17 0350 07/29/17 2214  WBC 5.6 6.0  HGB 10.3* 10.9*  HCT 31.3* 32.9*  MCV 86.7 85.9  PLT 168 161   Iron/TIBC/Ferritin/ %Sat    Component Value Date/Time   FERRITIN 111 01/05/2017 1101

## 2017-08-02 NOTE — Progress Notes (Signed)
Pt discharged home. AVS reviewed. PIV removed. Pt dc with R IJ HD access in place. Pt left unit with belongings in hand, to pick up new prescriptions tonight. Pt to be transported home by wife.

## 2017-08-02 NOTE — Discharge Instructions (Signed)
James Burke was an inpatient at [facility] from 07/25/17 until 08/02/17.

## 2017-08-02 NOTE — Discharge Summary (Addendum)
Discharge Summary  James Burke ZOX:[id number] DOB: [birth year]  PCP: [name], MD  Admit date: 07/25/2017 Discharge date: 08/02/2017  Time spent: 25 minutes  Recommendations for Outpatient Follow-up:  1. Follow up with nephrology within a wee 2. Keep hemodialysis appointment 3. Follow up with PCP within a week 4. Take your medications as prescribed  Discharge Diagnoses:  Active Hospital Problems   Diagnosis Date Noted  . Anxiety 07/31/2017  . Chest pain 07/25/2017  . Rib pain 07/25/2017  . ESRD (end stage renal disease) ([place])   . Charcot foot due to diabetes mellitus ([place]) 10/23/2015  . Essential hypertension, benign 01/12/2012  . Controlled type 2 diabetes with renal manifestation ([place]) 01/03/2012  . Chronic systolic CHF (congestive heart failure), NYHA class 1 ([place]) 01/03/2012    Resolved Hospital Problems  No resolved problems to display.    Discharge Condition: Stable  Diet recommendation: Renal dialysis diet, low salt.  Vitals:   08/02/17 1500 08/02/17 1730  BP: 119/73 131/82  Pulse: 76   Resp: 18   Temp: 98.2 F (36.8 C)   SpO2: 99%     History of present illness:  James A Bohannonis a 51 y.o.malewith medical history significant ofC. difficile colitis, CHF, diabetes, DVT, ESRD on dialysis M,W,F, GERD.  Patient presents after being told by his dialysis center to come to the hospital due to missing dialysis for more than a week. Patient endorses missing his dialysis sessions due to intermittent episodes of nausea and vomiting making him feel too weak to be able to go to dialysis. Also he is not able to sit still for 4 hours, he gets anxious. Patient typically dialyzes on Monday Wednesday and Friday. Patient denies abdominal pain, diarrhea, fevers, flank pain, neck stiffness, headache, focal neurological deficits, rash, palpitations, shortness of breath. Symptoms of nausea vomiting and food intolerance are improving without intervention.  L  rib cage pain:Patient reports falling approximately 7 days ago. Mechanical fall. Pain throughout left lower rib area since that time. Constant. Worse with. Improves with home opioids. Denies palpitations, substernal chest pain with associated nausea and shortness of breath or radiation. Some relief also with icing area.  Admitted with volume overload, anasarca, uremic from missing HD. Psych was consulted for medications management for anxiety during HD. Tolerated dialysis well. Under dry weight but still has fluid to be removed. Stable for dc from nephrology standpoint. Patient will need to keep his hemodialysis appointments.  On the day of discharge the pt was hemodynamically stable. O2 saturation 99% on RA. BP stable.   Hospital Course:  Principal Problem:   Anxiety Active Problems:   Controlled type 2 diabetes with renal manifestation (H[facility])   Chronic systolic CHF (congestive heart failure), NYHA class 1 (H[facility])   Essential hypertension, benign   Charcot foot due to diabetes mellitus (H[facility])   ESRD (end stage renal disease) (H[facility])   Chest pain   Rib pain  1-ESRD; volume overload;  Had HD again 11-21, 11-23-, 11-24, 11-26, 11-28 Volume status improving.  Weight 209----198--192--186 Under dry weight per nephro Continue HD outpatent  2-Depression, anxiety;  Appreciate Dr Mariea Clonts recommendation.  Avoid seroquel due to QT prolongation Trazodone  3-Chest wall, Ribs pain.  Xray negative for fracture.  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 No evidence of acute  decompensation. Continue beta blocker I/O, daily weights Fluid removed per dialysis  6-Massive abdominal ascites; discussed with Dr Lorrene Reid, will ask fro US paracentesis. Therapeutic.    Will give ativan prior to procedure.   7. N/V:  - suspect related to uremia.  resolved.   DM: on trulicity. Last injection 07/22/17 - SSI refused insulin coverage.   GERD: - continue PPI   Procedures:  Hemodialysis  Consultations:  Nephrology  Discharge Exam: BP 131/82   Pulse 76   Temp 98.2 F (36.8 C) (Oral)   Resp 18   Ht 6' (1.829 m)   Wt 81.9 kg (180 lb 8.9 oz)   SpO2 99%   BMI 24.49 kg/m   General: 51 yo CM WD WN NAD A&O x3.  Cardiovascular: RRR with no rubs or gallops Respiratory: CTA with no wheezes or rales  Discharge Instructions You were cared for by a hospitalist during your hospital stay. If you have any questions about your discharge medications or the care you received while you were in the hospital after you are discharged, you can call the unit and asked to speak with the hospitalist on call if the hospitalist that took care of you is not available. Once you are discharged, your primary care physician will handle any further medical issues. Please note that NO REFILLS for any discharge medications will be authorized once you are discharged, as it is imperative that you return to your primary care physician (or establish a relationship with a primary care physician if you do not have one) for your aftercare needs so that they can reassess your need for medications and monitor your lab values.   Allergies as of 08/02/2017      Reactions   Prednisone Other (See Comments)   BLINDNESS > [ ? CSCR ? ]   Simvastatin Other (See Comments)   MYALGIAS, JOINT PAIN   Lorazepam Anxiety, Other (See Comments)   Nervousness and "fidgets"; legs "need to kick   Methocarbamol Diarrhea      Medication List    STOP taking these medications   glipiZIDE 5 MG tablet Commonly known as:  GLUCOTROL     TAKE these medications   amLODipine 10 MG tablet Commonly known as:  NORVASC Take 1 tablet (10 mg total) by mouth daily.   aspirin 81 MG tablet Take 81 mg  by mouth at bedtime.   carvedilol 25 MG tablet Commonly known as:  COREG Take 1 tablet (25 mg total) by mouth 2 (two) times daily with a meal.   docusate sodium 100 MG capsule Commonly known as:  COLACE Take 1 capsule (100 mg total) by mouth at bedtime.   furosemide 40 MG tablet Commonly known as:  LASIX Take 2 tablets (80 mg total) by mouth 2 (two) times daily.   gabapentin 100 MG capsule Commonly known as:  NEURONTIN Take 2 capsules (200 mg total) by mouth daily as needed (Give one hour prior to hemodialysis session as needed for anxiety/restlessness).   hydrALAZINE 25 MG tablet Commonly known as:  APRESOLINE Take 1 tablet (25 mg total) 3 (three) times daily by mouth.   hydrOXYzine 25 MG tablet Commonly known as:  ATARAX/VISTARIL Take 1-2 tablets (25-50 mg total) by mouth daily as needed.   isosorbide mononitrate 30 MG 24 hr tablet Commonly known as:  IMDUR Take 1 tablet (30 mg total) by mouth daily.   omeprazole 40 MG capsule Commonly known as:  PRILOSEC Take 1 capsule (40 mg total) by mouth daily.  oxyCODONE-acetaminophen 10-325 MG tablet Commonly known as:  PERCOCET Take 1 tablet by mouth every 6 (six) hours as needed for pain.   polyethylene glycol powder powder Commonly known as:  MIRALAX Take 17 g by mouth daily as needed for moderate constipation.   tamsulosin 0.4 MG Caps capsule Commonly known as:  FLOMAX TAKE 2 CAPSULES BY MOUTH DAILY AFTER SUPPER   traZODone 50 MG tablet Commonly known as:  DESYREL Take 1 tablet (50 mg total) by mouth at bedtime.   TRULICITY 8.56 DJ/4.9FW Sopn Generic drug:  Dulaglutide INJECT 0.75MG  EVERY 7 DAYS ON SATURDAY   zolpidem 10 MG tablet Commonly known as:  AMBIEN Take 1 tablet (10 mg total) by mouth at bedtime.      Allergies  Allergen Reactions  . Prednisone Other (See Comments)    BLINDNESS > [ ? CSCR ? ]  . Simvastatin Other (See Comments)    MYALGIAS, JOINT PAIN  . Lorazepam Anxiety and Other (See  Comments)    Nervousness and "fidgets"; legs "need to kick  . Methocarbamol Diarrhea      The results of significant diagnostics from this hospitalization (including imaging, microbiology, ancillary and laboratory) are listed below for reference.    Significant Diagnostic Studies: Ct Abdomen Pelvis Wo Contrast  Result Date: 07/26/2017 CLINICAL DATA:  Vomiting, recent fall, pain with deep inspiration, increased fluid in the abdomen and 1 8. Left rib pain. Initial encounter. Worsening in severe left-sided abdominal pain. EXAM: CT ABDOMEN AND PELVIS WITHOUT CONTRAST TECHNIQUE: Multidetector CT imaging of the abdomen and pelvis was performed following the standard protocol without IV contrast. COMPARISON:  03/05/2014. FINDINGS: Lower chest: Small right pleural effusion. Heart is enlarged. Decreased attenuation of the intravascular compartment is indicative of anemia. No pericardial effusion. Hepatobiliary: There is streak artifact throughout the posterior abdomen due to the patient's arms. This limits diagnostic quality. Liver and gallbladder are grossly unremarkable. No definite biliary ductal dilatation. Pancreas: Negative. Spleen: Partially obscured by streak artifact from the patient's arms. Grossly unremarkable. Adrenals/Urinary Tract: Adrenal glands are unremarkable. Kidneys are poorly evaluated due to streak artifact from the patient's arms as well as lack of post-contrast imaging. Kidneys are grossly unremarkable. Bladder is low in volume. Stomach/Bowel: Stomach, small bowel and colon are grossly unremarkable. Vascular/Lymphatic: Left renal artery and bilateral common iliac artery calcification. Inguinal lymph nodes measure up to 13 mm on the left. Borderline enlarged abdominal retroperitoneal lymph nodes, up to 12 mm in the left periaortic station. Reproductive: Prostate is visualized. Other: Large ascites. No definite high attenuation to suggest hemorrhage. Diffuse body wall edema. Presacral  edema. Musculoskeletal: No fracture. IMPRESSION: 1. Exam quality is limited by lack of post-contrast imaging and streak artifact through the posterior abdomen from the patient's arms. No definite evidence of acute trauma. 2. Large ascites, diffuse body wall edema and small right pleural effusion. Electronically Signed   By: Lorin Picket M.D.   On: 07/26/2017 14:43   Dg Chest 2 View  Result Date: 07/25/2017 CLINICAL DATA:  Left anterior rib pain after fall times 1-1/2 weeks. Dyspnea. EXAM: CHEST  2 VIEW COMPARISON:  01/03/2017 CXR FINDINGS: Stable cardiomegaly with minimal aortic atherosclerosis at the arch. No aneurysm. Dialysis catheter tips are seen in the proximal and mid right atrium. Lungs are clear without pneumonic consolidation, effusion or pneumothorax. No acute fracture of the included ribs. No acute osseous abnormality of the dorsal spine. IMPRESSION: Dialysis catheter tips are seen within the right atrium. Stable cardiomegaly is noted with minimal aortic atherosclerosis.  No acute fracture of the included ribs and dorsal spine. Electronically Signed   By: Ashley Royalty M.D.   On: 07/25/2017 15:00   US Paracentesis  Result Date: 07/30/2017 INDICATION: ascites EXAM: ULTRASOUND-GUIDED PARACENTESIS COMPARISON:  None. MEDICATIONS: 10 cc 1% lidocaine. COMPLICATIONS: None immediate. TECHNIQUE: Informed written consent was obtained from the patient after a discussion of the risks, benefits and alternatives to treatment. A timeout was performed prior to the initiation of the procedure. Initial ultrasound scanning demonstrates a large amount of ascites within the right lower abdominal quadrant. The right lower abdomen was prepped and draped in the usual sterile fashion. 1% lidocaine with epinephrine was used for local anesthesia. Under direct ultrasound guidance, a 19 gauge, 7-cm, Yueh catheter was introduced. An ultrasound image was saved for documentation purposed. The paracentesis was performed. The  catheter was removed and a dressing was applied. The patient tolerated the procedure well without immediate post procedural complication. FINDINGS: A total of approximately 4.6 liters of amber fluid was removed. IMPRESSION: Successful ultrasound-guided paracentesis yielding 4.6 liters of peritoneal fluid. Read by Lavonia Drafts [facility] Electronically Signed   By: Corrie Mckusick D.O.   On: 07/29/2017 14:17    Microbiology: No results found for this or any previous visit (from the past 240 hour(s)).   Labs: Basic Metabolic Panel: Recent Labs  Lab 07/28/17 0844 07/29/17 1810 07/31/17 2053 08/01/17 0537  NA 132* 129* 130* 133*  K 4.5 4.2 4.0 3.8  CL 97* 93* 94* 96*  CO2 24 25 26 27   GLUCOSE 155* 185* 119* 109*  BUN 45* 34* 33* 23*  CREATININE 6.09* 5.32* 5.88* 4.46*  CALCIUM 8.0* 8.1* 8.3* 8.3*  MG  --   --   --  2.0  PHOS 2.7 2.4* 3.0  --    Liver Function Tests: Recent Labs  Lab 07/28/17 0844 07/29/17 1810 07/31/17 2053  ALBUMIN 3.1* 3.2* 3.0*   No results for input(s): LIPASE, AMYLASE in the last 168 hours. No results for input(s): AMMONIA in the last 168 hours. CBC: Recent Labs  Lab 07/28/17 0350 07/29/17 2214  WBC 5.6 6.0  HGB 10.3* 10.9*  HCT 31.3* 32.9*  MCV 86.7 85.9  PLT 168 161   Cardiac Enzymes: No results for input(s): CKTOTAL, CKMB, CKMBINDEX, TROPONINI in the last 168 hours. BNP: BNP (last 3 results) Recent Labs    12/30/16 0042  BNP 2,695.8*    ProBNP (last 3 results) No results for input(s): PROBNP in the last 8760 hours.  CBG: Recent Labs  Lab 08/01/17 0748 08/01/17 1106 08/01/17 1619 08/01/17 2125 08/02/17 1602  GLUCAP 153* 141* 144* 126* 161*       Signed:  Kayleen Memos, MD [facility] 08/02/2017, 6:27 PM

## 2017-08-08 ENCOUNTER — Other Ambulatory Visit: Payer: Self-pay | Admitting: Family Medicine

## 2017-08-14 ENCOUNTER — Other Ambulatory Visit: Payer: Self-pay | Admitting: Family Medicine

## 2017-08-14 NOTE — Telephone Encounter (Signed)
This was given to pt while in the hospital. Ok to refill. It is for #15?

## 2017-08-15 ENCOUNTER — Ambulatory Visit: Payer: BLUE CROSS/BLUE SHIELD | Admitting: Family Medicine

## 2017-08-15 ENCOUNTER — Encounter: Payer: Self-pay | Admitting: Family Medicine

## 2017-08-15 VITALS — BP 121/64 | HR 73

## 2017-08-15 DIAGNOSIS — R0789 Other chest pain: Secondary | ICD-10-CM

## 2017-08-15 DIAGNOSIS — N186 End stage renal disease: Secondary | ICD-10-CM

## 2017-08-15 DIAGNOSIS — R0781 Pleurodynia: Secondary | ICD-10-CM | POA: Diagnosis not present

## 2017-08-15 DIAGNOSIS — G8929 Other chronic pain: Secondary | ICD-10-CM | POA: Diagnosis not present

## 2017-08-15 DIAGNOSIS — Z794 Long term (current) use of insulin: Secondary | ICD-10-CM | POA: Diagnosis not present

## 2017-08-15 DIAGNOSIS — Z992 Dependence on renal dialysis: Secondary | ICD-10-CM

## 2017-08-15 DIAGNOSIS — E1122 Type 2 diabetes mellitus with diabetic chronic kidney disease: Secondary | ICD-10-CM | POA: Diagnosis not present

## 2017-08-15 LAB — POCT GLYCOSYLATED HEMOGLOBIN (HGB A1C): Hemoglobin A1C: 5.1

## 2017-08-15 MED ORDER — OXYCODONE-ACETAMINOPHEN 10-325 MG PO TABS: 1.0000 | ORAL_TABLET | ORAL | 0 refills | Status: DC | PRN

## 2017-08-15 MED ORDER — ROPINIROLE HCL 0.25 MG PO TABS: 0.2500 mg | ORAL_TABLET | Freq: Every day | ORAL | 1 refills | Status: DC

## 2017-08-15 NOTE — Patient Instructions (Signed)
Cut glipizide in half or stop med completely.

## 2017-08-15 NOTE — Progress Notes (Signed)
   Subjective:    Patient ID: James Burke, male    DOB: 07-20-1966, 51 y.o.   MRN: [id number]  HPI 51 year old male with ESRD  comes in today to follow-up for chronic pain management.  He was actually recently admitted to the hospital.  Admit date: 07/25/2017.  Discharge date: 08/02/2017.  He had actually skipped dialysis for almost a week.  He gets extremely anxious when he goes in and really has a hard time sitting still in fact most of the time he has into sessions early because he just starts to get incredibly anxious.  He was told to go to the hospital.  He typically is supposed to dialyze on Mondays, Wednesdays, and Fridays.  He was not having any specific symptoms at the time.  That he did not complain of some left rib cage pain after falling about a week before.  He was admitted with volume overload, anasarca and uremia from missing his hemodialysis.  Psychiatry was consulted for medication management for anxiety during dialysis.  Started him on trazodone 50 mg at bedtime.  He also takes Ambien 10 mg at bedtime.  Also stopped his glipizide.     Diabetes-he had actually stopped his evening dose of glipizide because he was having some lows.  He is still taking 1 tab in the morning but still had some occasional lows particularly if he ends up eating later than he means to.  He has been developing restless leg syndrome.  He says his legs will start moving and jerking.  He has been taking his wife's restless leg medication and it does seem to help but he wanted to make sure it was safe  ESRD -better and feels like his appetite is back.  The gabapentin before dialysis has really helped him be able to get through a full dialysis session.  Review of Systems     Objective:   Physical Exam  Constitutional: He is oriented to person, place, and time. He appears well-developed and well-nourished.  HENT:  Head: Normocephalic and atraumatic.  Cardiovascular: Normal rate, regular rhythm and normal  heart sounds.  Pulmonary/Chest: Effort normal and breath sounds normal.  Neurological: He is alert and oriented to person, place, and time.  Skin: Skin is warm and dry.  Psychiatric: He has a normal mood and affect. His behavior is normal.         Assessment & Plan:  Anxiety-he is doing well with taking a dose of gabapentin about an hour before dialysis and then taking the trazodone.  His sleep is been getting a little bit better as well.  Left rib pain- improved.    End-stage renal disease-back on dialysis consistently.  He has not missed another treatment.  Diabetes-discussed stopping the glipizide completely but he was a little hesitant to do that so he is at least going to cut the tab in half and just take a half a tab once a day in the mornings and keep an eye on it.  If he continues to have hypoglycemia that I want him to stop the tab completely for sure.  Follow-up in 3 months.  Chronic pain management - Percocet refilled today we did adjust these Sig to read every 4 hours instead of every 6 hours as needed.  He was previously getting every 4 hours with his medication.  I verified in the controlled substance registry before filling medication today.    Restless leg syndrome - will start requip 0.25mg  QD.

## 2017-08-25 ENCOUNTER — Other Ambulatory Visit: Payer: Self-pay | Admitting: Physician Assistant

## 2017-10-02 ENCOUNTER — Other Ambulatory Visit: Payer: Self-pay | Admitting: Family Medicine

## 2017-10-03 ENCOUNTER — Encounter: Payer: Self-pay | Admitting: Family Medicine

## 2017-10-03 ENCOUNTER — Other Ambulatory Visit: Payer: Self-pay | Admitting: Family Medicine

## 2017-10-05 ENCOUNTER — Other Ambulatory Visit: Payer: Self-pay

## 2017-10-05 MED ORDER — OXYCODONE-ACETAMINOPHEN 10-325 MG PO TABS: 1.0000 | ORAL_TABLET | ORAL | 0 refills | Status: DC | PRN

## 2017-10-05 NOTE — Telephone Encounter (Signed)
Refill request for oxycodone.  

## 2017-10-20 ENCOUNTER — Other Ambulatory Visit: Payer: Self-pay | Admitting: Family Medicine

## 2017-10-20 MED ORDER — ATORVASTATIN CALCIUM 20 MG PO TABS: 20.0000 mg | ORAL_TABLET | Freq: Every day | ORAL | 3 refills | Status: DC

## 2017-10-23 ENCOUNTER — Other Ambulatory Visit: Payer: Self-pay | Admitting: *Deleted

## 2017-10-23 ENCOUNTER — Other Ambulatory Visit: Payer: Self-pay | Admitting: Family Medicine

## 2017-10-23 DIAGNOSIS — Z794 Long term (current) use of insulin: Secondary | ICD-10-CM

## 2017-10-23 DIAGNOSIS — E1122 Type 2 diabetes mellitus with diabetic chronic kidney disease: Secondary | ICD-10-CM

## 2017-10-23 MED ORDER — GLIPIZIDE 5 MG PO TABS: 5.0000 mg | ORAL_TABLET | Freq: Two times a day (BID) | ORAL | 1 refills | Status: DC

## 2017-10-29 ENCOUNTER — Other Ambulatory Visit: Payer: Self-pay | Admitting: Family Medicine

## 2017-11-03 ENCOUNTER — Other Ambulatory Visit: Payer: Self-pay | Admitting: Family Medicine

## 2017-11-09 ENCOUNTER — Other Ambulatory Visit: Payer: Self-pay | Admitting: Family Medicine

## 2017-11-14 ENCOUNTER — Ambulatory Visit: Payer: Self-pay | Admitting: Family Medicine

## 2017-11-14 DIAGNOSIS — Z0189 Encounter for other specified special examinations: Secondary | ICD-10-CM

## 2017-11-21 ENCOUNTER — Encounter: Payer: Self-pay | Admitting: Family Medicine

## 2017-11-21 ENCOUNTER — Ambulatory Visit: Payer: BLUE CROSS/BLUE SHIELD | Admitting: Family Medicine

## 2017-11-21 VITALS — BP 119/60 | HR 81 | Ht 72.0 in

## 2017-11-21 DIAGNOSIS — E1122 Type 2 diabetes mellitus with diabetic chronic kidney disease: Secondary | ICD-10-CM | POA: Diagnosis not present

## 2017-11-21 DIAGNOSIS — G8929 Other chronic pain: Secondary | ICD-10-CM | POA: Diagnosis not present

## 2017-11-21 DIAGNOSIS — Z794 Long term (current) use of insulin: Secondary | ICD-10-CM | POA: Diagnosis not present

## 2017-11-21 DIAGNOSIS — I5042 Chronic combined systolic (congestive) and diastolic (congestive) heart failure: Secondary | ICD-10-CM

## 2017-11-21 DIAGNOSIS — I10 Essential (primary) hypertension: Secondary | ICD-10-CM | POA: Diagnosis not present

## 2017-11-21 LAB — POCT GLYCOSYLATED HEMOGLOBIN (HGB A1C): Hemoglobin A1C: 5.2

## 2017-11-21 MED ORDER — OXYCODONE-ACETAMINOPHEN 10-325 MG PO TABS: 1.0000 | ORAL_TABLET | ORAL | 0 refills | Status: DC | PRN

## 2017-11-21 NOTE — Progress Notes (Signed)
Subjective:    CC: DM, HTN   HPI:  Diabetes - no hypoglycemic events. No wounds or sores that are not healing well. No increased thirst or urination. Checking glucose at home. Taking medications as prescribed without any side effects.  Currently on Trulicity and glipizide.  Taking half a tab of the glipizide once a day.  When I last saw him he had Artie decreased down to once a day from twice a day.  Hypertension- Pt denies chest pain, SOB, dizziness, or heart palpitations.  Taking meds as directed w/o problems.  Denies medication side effects.    F/U combined heart failure.    Stage renal disease-he feels like his little volume overloaded right now and is actually I have an extra dialysis treatment.  He is been having upper respiratory symptoms on and off for about 4 weeks.  He says he and his wife have been passing it back and forth.  He is been getting into things like chicken noodle soup and so he is had a little more salt than usual and feels like he has more fluid and at the his abdomen.  But is not been short of breath.  Chronic pain medication for his left knee and for his foot.  He normally takes 1 or 2 tabs at bedtime and then they have been having him take 1 tab right after dialysis on Monday Wednesdays and Fridays because the dialysis will washout the remaining narcotic from his system and he would actually start to get a most like a withdrawal he would start to shake after dialysis.  This regimen seems to be working well for him.  Past medical history, Surgical history, Family history not pertinant except as noted below, Social history, Allergies, and medications have been entered into the medical record, reviewed, and corrections made.   Review of Systems: No fevers, chills, night sweats, weight loss, chest pain, or shortness of breath.   Objective:    General: Well Developed, well nourished, and in no acute distress.  Neuro: Alert and oriented x3, extra-ocular muscles intact,  sensation grossly intact.  HEENT: Normocephalic, atraumatic  Skin: Warm and dry, no rashes.  No lower extremity edema. Cardiac: Regular rate and rhythm, no murmurs rubs or gallops, no lower extremity edema.  Respiratory: Clear to auscultation bilaterally. Not using accessory muscles, speaking in full sentences.   Impression and Recommendations:    DM - well controlled. Stop the glipizide.  Continue with the Trulicity as it is less likely to cause hypoglycemia.  If his next hemoglobin A1c looks great then we might even be able to discontinue the Trulicity and just keep him diet controlled.  HTN  - Well controlled. Continue current regimen. Follow up in  4 months.    Combined heart failure -stable.  I think the volume overload is directly from his abdomen and not around his heart.  Is not having any chest pain or shortness of breath.  And no lower extremity edema today.  Pain management -filled hydrocodone today.  Because of end-stage renal failure he is unable to give a urine sample.

## 2017-11-21 NOTE — Patient Instructions (Signed)
Sop the glipizide.

## 2017-11-23 DIAGNOSIS — E8770 Fluid overload, unspecified: Secondary | ICD-10-CM | POA: Insufficient documentation

## 2017-12-11 ENCOUNTER — Other Ambulatory Visit (HOSPITAL_COMMUNITY): Payer: Self-pay | Admitting: Nephrology

## 2017-12-11 DIAGNOSIS — R188 Other ascites: Secondary | ICD-10-CM

## 2017-12-14 ENCOUNTER — Ambulatory Visit (HOSPITAL_COMMUNITY)
Admission: RE | Admit: 2017-12-14 | Discharge: 2017-12-14 | Disposition: A | Discharge status: Home / Self Care | Payer: BLUE CROSS/BLUE SHIELD | Source: Ambulatory Visit | Attending: Nephrology | Admitting: Nephrology

## 2017-12-14 ENCOUNTER — Encounter (HOSPITAL_COMMUNITY): Payer: Self-pay | Admitting: Diagnostic Radiology

## 2017-12-14 DIAGNOSIS — R188 Other ascites: Secondary | ICD-10-CM | POA: Diagnosis not present

## 2017-12-14 HISTORY — PX: IR PARACENTESIS: IMG2679

## 2017-12-14 MED ORDER — LIDOCAINE HCL (PF) 2 % IJ SOLN
INTRAMUSCULAR | Status: AC | PRN
  Administered 2017-12-14: 10 mL

## 2017-12-14 MED ORDER — LIDOCAINE HCL (PF) 2 % IJ SOLN
INTRAMUSCULAR | Status: AC
  Filled 2017-12-14: qty 10

## 2017-12-14 NOTE — Procedures (Signed)
PROCEDURE SUMMARY:  Successful US guided paracentesis from RLQ.  Yielded 6.6 L of clear yellow fluid.  No immediate complications.  Pt tolerated well.   Specimen was sent for labs.  Ascencion Dike PA-C 12/14/2017 9:53 AM

## 2018-01-14 ENCOUNTER — Other Ambulatory Visit: Payer: Self-pay | Admitting: Family Medicine

## 2018-01-15 ENCOUNTER — Other Ambulatory Visit: Payer: Self-pay | Admitting: Family Medicine

## 2018-01-18 ENCOUNTER — Other Ambulatory Visit: Payer: Self-pay | Admitting: Family Medicine

## 2018-01-24 ENCOUNTER — Other Ambulatory Visit: Payer: Self-pay

## 2018-01-24 MED ORDER — OXYCODONE-ACETAMINOPHEN 10-325 MG PO TABS: 1.0000 | ORAL_TABLET | ORAL | 0 refills | Status: DC | PRN

## 2018-01-24 NOTE — Telephone Encounter (Signed)
Pt called requesting RF on his Percocet be sent to [facility]  Last RX written 11-21-17 for #180. Next OV scheduled for 02-20-18  RX pended. Please send if appropriate

## 2018-01-30 ENCOUNTER — Other Ambulatory Visit (HOSPITAL_COMMUNITY): Payer: Self-pay | Admitting: Nephrology

## 2018-01-30 DIAGNOSIS — R188 Other ascites: Secondary | ICD-10-CM

## 2018-02-01 ENCOUNTER — Encounter (HOSPITAL_COMMUNITY): Payer: Self-pay | Admitting: Student

## 2018-02-01 ENCOUNTER — Ambulatory Visit (HOSPITAL_COMMUNITY)
Admission: RE | Admit: 2018-02-01 | Discharge: 2018-02-01 | Disposition: A | Discharge status: Home / Self Care | Payer: BLUE CROSS/BLUE SHIELD | Source: Ambulatory Visit | Attending: Nephrology | Admitting: Nephrology

## 2018-02-01 DIAGNOSIS — K7460 Unspecified cirrhosis of liver: Secondary | ICD-10-CM | POA: Insufficient documentation

## 2018-02-01 DIAGNOSIS — R188 Other ascites: Secondary | ICD-10-CM | POA: Diagnosis present

## 2018-02-01 HISTORY — PX: IR PARACENTESIS: IMG2679

## 2018-02-01 MED ORDER — LIDOCAINE HCL (PF) 2 % IJ SOLN
INTRAMUSCULAR | Status: AC
  Filled 2018-02-01: qty 20

## 2018-02-01 MED ORDER — LIDOCAINE HCL (PF) 2 % IJ SOLN
INTRAMUSCULAR | Status: DC | PRN
  Administered 2018-02-01: 10 mL

## 2018-02-01 NOTE — Procedures (Signed)
PROCEDURE SUMMARY:  Successful US guided paracentesis from right lateral abdomen.  Yielded 7.5 liters of yellow fluid.  No immediate complications.  Pt tolerated well.   Specimen was not sent for labs.  Docia Barrier PA-C 02/01/2018 3:28 PM

## 2018-02-06 ENCOUNTER — Other Ambulatory Visit: Payer: Self-pay | Admitting: Family Medicine

## 2018-02-08 ENCOUNTER — Other Ambulatory Visit: Payer: Self-pay | Admitting: Family Medicine

## 2018-02-13 ENCOUNTER — Other Ambulatory Visit: Payer: Self-pay | Admitting: Family Medicine

## 2018-02-15 ENCOUNTER — Telehealth: Payer: Self-pay | Admitting: Family Medicine

## 2018-02-19 NOTE — Telephone Encounter (Signed)
error 

## 2018-02-20 ENCOUNTER — Ambulatory Visit: Payer: Self-pay | Admitting: Family Medicine

## 2018-02-23 ENCOUNTER — Other Ambulatory Visit: Payer: Self-pay | Admitting: Family Medicine

## 2018-03-06 ENCOUNTER — Ambulatory Visit: Payer: Self-pay | Admitting: Family Medicine

## 2018-03-19 ENCOUNTER — Other Ambulatory Visit: Payer: Self-pay | Admitting: Cardiology

## 2018-03-23 ENCOUNTER — Other Ambulatory Visit: Payer: Self-pay

## 2018-03-23 ENCOUNTER — Other Ambulatory Visit: Payer: Self-pay | Admitting: Family Medicine

## 2018-03-23 MED ORDER — OXYCODONE-ACETAMINOPHEN 10-325 MG PO TABS: 1.0000 | ORAL_TABLET | ORAL | 0 refills | Status: DC | PRN

## 2018-03-23 NOTE — Telephone Encounter (Signed)
James Burke requests a refill on Oxycodone. Next follow up is next Thursday.

## 2018-03-27 ENCOUNTER — Other Ambulatory Visit (HOSPITAL_COMMUNITY): Payer: Self-pay | Admitting: Nephrology

## 2018-03-27 DIAGNOSIS — R188 Other ascites: Secondary | ICD-10-CM

## 2018-03-28 ENCOUNTER — Encounter (HOSPITAL_COMMUNITY): Payer: Self-pay | Admitting: Physician Assistant

## 2018-03-28 ENCOUNTER — Ambulatory Visit (HOSPITAL_COMMUNITY)
Admission: RE | Admit: 2018-03-28 | Discharge: 2018-03-28 | Disposition: A | Discharge status: Home / Self Care | Payer: BLUE CROSS/BLUE SHIELD | Source: Ambulatory Visit | Attending: Nephrology | Admitting: Nephrology

## 2018-03-28 DIAGNOSIS — R188 Other ascites: Secondary | ICD-10-CM | POA: Insufficient documentation

## 2018-03-28 DIAGNOSIS — Z992 Dependence on renal dialysis: Secondary | ICD-10-CM | POA: Diagnosis not present

## 2018-03-28 DIAGNOSIS — K7460 Unspecified cirrhosis of liver: Secondary | ICD-10-CM | POA: Diagnosis not present

## 2018-03-28 DIAGNOSIS — N186 End stage renal disease: Secondary | ICD-10-CM | POA: Diagnosis not present

## 2018-03-28 HISTORY — PX: IR PARACENTESIS: IMG2679

## 2018-03-28 MED ORDER — LIDOCAINE HCL (PF) 2 % IJ SOLN
INTRAMUSCULAR | Status: AC
  Filled 2018-03-28: qty 20

## 2018-03-28 MED ORDER — LIDOCAINE HCL (PF) 2 % IJ SOLN
INTRAMUSCULAR | Status: DC | PRN
  Administered 2018-03-28: 10 mL

## 2018-03-28 NOTE — Procedures (Signed)
PROCEDURE SUMMARY:  Successful image-guided paracentesis from the right abdomen.  Yielded 9.4 liters of amber fluid.  No immediate complications.  Patient tolerated well.   Specimen was not sent for labs.  Joaquim Nam PA-C 03/28/2018 2:58 PM

## 2018-03-29 ENCOUNTER — Ambulatory Visit (INDEPENDENT_AMBULATORY_CARE_PROVIDER_SITE_OTHER): Payer: BLUE CROSS/BLUE SHIELD | Admitting: Family Medicine

## 2018-03-29 ENCOUNTER — Encounter: Payer: Self-pay | Admitting: Family Medicine

## 2018-03-29 VITALS — BP 100/54 | HR 72 | Temp 98.0°F | Ht 72.0 in | Wt 178.0 lb

## 2018-03-29 DIAGNOSIS — Z992 Dependence on renal dialysis: Secondary | ICD-10-CM

## 2018-03-29 DIAGNOSIS — Z794 Long term (current) use of insulin: Secondary | ICD-10-CM

## 2018-03-29 DIAGNOSIS — R197 Diarrhea, unspecified: Secondary | ICD-10-CM

## 2018-03-29 DIAGNOSIS — R188 Other ascites: Secondary | ICD-10-CM

## 2018-03-29 DIAGNOSIS — E1122 Type 2 diabetes mellitus with diabetic chronic kidney disease: Secondary | ICD-10-CM | POA: Diagnosis not present

## 2018-03-29 DIAGNOSIS — N186 End stage renal disease: Secondary | ICD-10-CM | POA: Diagnosis not present

## 2018-03-29 DIAGNOSIS — Z125 Encounter for screening for malignant neoplasm of prostate: Secondary | ICD-10-CM | POA: Diagnosis not present

## 2018-03-29 LAB — POCT GLYCOSYLATED HEMOGLOBIN (HGB A1C): Hemoglobin A1C: 6.1 % — AB (ref 4.0–5.6)

## 2018-03-29 MED ORDER — AMOXICILLIN 500 MG PO CAPS: 500.0000 mg | ORAL_CAPSULE | Freq: Two times a day (BID) | ORAL | 0 refills | Status: DC

## 2018-03-29 NOTE — Progress Notes (Signed)
Subjective:    CC: Diarrhea   HPI:  Diabetes - no hypoglycemic events. No wounds or sores that are not healing well. No increased thirst or urination. Checking glucose at home. Taking medications as prescribed without any side effects.  He actually stopped his glipizide at last office visit.  Reports intermittent diarrhea since July 4.  He does not think it gets better and start to try to eat normally again and then he gets diarrhea again.  Not having any blood in his stool.  He has been passing a lot of gas.  No recent travel.  Had some old amoxicillin tabs that he took and says that really seem to help him.  He does report a prior history of C. difficile but says is quite different and the dry diarrhea is not nearly as severe.  Is been using Imodium as needed.  For his mood he says the gabapentin and as needed hydroxyzine seem to be working well.  End-stage renal disease-he is on dialysis 3 times per week.  He is actually been doing some research and is considering doing home dialysis.  For several months now he has been getting some ascites in his abdomen.  He was told that his liver enzymes were normal so is not exactly clear why he is getting the swelling.  He says it starts out very slowly over a few weeks and then seems to build up.  Once it is elevated then he feels like every time he drinks something it seems to get worse.  He says yesterday he just had 9.6 L drained off and says today just feels extremely sore in his abdomen and his ribs.  He still feels like his abdomen is a little bit swollen but it looks much better than it was previously.  Past medical history, Surgical history, Family history not pertinant except as noted below, Social history, Allergies, and medications have been entered into the medical record, reviewed, and corrections made.   Review of Systems: No fevers, chills, night sweats, weight loss, chest pain, or shortness of breath.   Objective:    General: Well  Developed, well nourished, and in no acute distress.  Neuro: Alert and oriented x3, extra-ocular muscles intact, sensation grossly intact.  HEENT: Normocephalic, atraumatic  Skin: Warm and dry, no rashes. Cardiac: Regular rate and rhythm, no murmurs rubs or gallops, no lower extremity edema.  Respiratory: Clear to auscultation bilaterally. Not using accessory muscles, speaking in full sentences.   Impression and Recommendations:    DM -controlled though his hemoglobin A1c is up from previous but still well-controlled overall.  Is been purposely eating more sugar ever since he had that severe hypoglycemic event where he could not get his blood sugar up and had to go to the emergency department.  Is actually scheduled for retinal surgery on his left eye.  He is followed at [facility]. Lab Results  Component Value Date   HGBA1C 6.1 (A) 03/29/2018     Diarrhea x 3 weeks.  Unclear etiology.  He really does not want to do any stool cultures etc. right now.  He does have a prior history of C. difficile.  He really wants to try another round of amoxicillin since it really seem to help him.  I explained that often times this tends to cause diarrhea but he seems that seems to feel that it really did make a big difference.  So we will try the amoxicillin again.  Or offered to try the  azithromycin which is often used for traveler's diarrhea.  He is not having any significant pain or blood in the stool but if he does not improve then we will he will need to do stool cultures etc.  Declined colonoscopy, says we will consider scheduling later this year  End-stage renal disease-continue with dialysis treatments.  Ascites-unclear if this is coming from his liver or not.  Plan to recheck liver enzymes.  Due for screening PSA.

## 2018-04-06 DIAGNOSIS — N2581 Secondary hyperparathyroidism of renal origin: Secondary | ICD-10-CM | POA: Insufficient documentation

## 2018-04-06 LAB — HEPATIC FUNCTION PANEL
AG Ratio: 1.2 (calc) (ref 1.0–2.5)
ALT: 11 U/L (ref 9–46)
AST: 14 U/L (ref 10–35)
Albumin: 3.6 g/dL (ref 3.6–5.1)
Alkaline phosphatase (APISO): 94 U/L (ref 40–115)
Bilirubin, Direct: 0.2 mg/dL (ref 0.0–0.2)
Globulin: 2.9 g/dL (calc) (ref 1.9–3.7)
Indirect Bilirubin: 0.5 mg/dL (calc) (ref 0.2–1.2)
Total Bilirubin: 0.7 mg/dL (ref 0.2–1.2)
Total Protein: 6.5 g/dL (ref 6.1–8.1)

## 2018-04-06 LAB — LIPID PANEL
Cholesterol: 115 mg/dL (ref <200)
HDL: 31 mg/dL — ABNORMAL LOW (ref >40)
LDL Cholesterol (Calc): 62 mg/dL (calc)
Non-HDL Cholesterol (Calc): 84 mg/dL (calc) (ref <130)
Total CHOL/HDL Ratio: 3.7 (calc) (ref <5.0)
Triglycerides: 141 mg/dL (ref <150)

## 2018-04-06 LAB — PSA: PSA: 0.4 ng/mL (ref <=4.0)

## 2018-04-09 NOTE — Progress Notes (Signed)
All labs are normal. 

## 2018-04-18 ENCOUNTER — Other Ambulatory Visit: Payer: Self-pay | Admitting: Family Medicine

## 2018-04-19 ENCOUNTER — Other Ambulatory Visit (HOSPITAL_COMMUNITY): Payer: Self-pay | Admitting: Nephrology

## 2018-04-20 ENCOUNTER — Other Ambulatory Visit (HOSPITAL_COMMUNITY): Payer: Self-pay | Admitting: Nephrology

## 2018-04-20 DIAGNOSIS — R188 Other ascites: Secondary | ICD-10-CM

## 2018-04-24 ENCOUNTER — Ambulatory Visit (HOSPITAL_COMMUNITY): Admission: RE | Admit: 2018-04-24 | Payer: BLUE CROSS/BLUE SHIELD | Source: Ambulatory Visit

## 2018-04-24 ENCOUNTER — Encounter (HOSPITAL_COMMUNITY): Payer: Self-pay

## 2018-04-24 ENCOUNTER — Ambulatory Visit (HOSPITAL_COMMUNITY)
Admission: RE | Admit: 2018-04-24 | Discharge: 2018-04-24 | Disposition: A | Discharge status: Home / Self Care | Payer: BLUE CROSS/BLUE SHIELD | Source: Ambulatory Visit | Attending: Nephrology | Admitting: Nephrology

## 2018-04-24 DIAGNOSIS — R188 Other ascites: Secondary | ICD-10-CM | POA: Insufficient documentation

## 2018-04-24 MED ORDER — LIDOCAINE HCL (PF) 1 % IJ SOLN
INTRAMUSCULAR | Status: AC
  Filled 2018-04-24: qty 10

## 2018-04-24 NOTE — Procedures (Signed)
PROCEDURE SUMMARY:  Successful US guided paracentesis from right lateral abdomen.  Yielded 8.1 liters of yellow fluid.  No immediate complications.  Pt tolerated well.   Specimen was not sent for labs.  Docia Barrier PA-C 04/24/2018 1:08 PM

## 2018-04-30 IMAGING — DX DG CHEST 1V PORT
1 series · 1 of 1 positions shown · non-contrast
Comparison: PA and lateral chest 06/04/2014.

CLINICAL DATA: Status post dialysis catheter placement.

EXAM:
PORTABLE CHEST 1 VIEW

[chest ap]
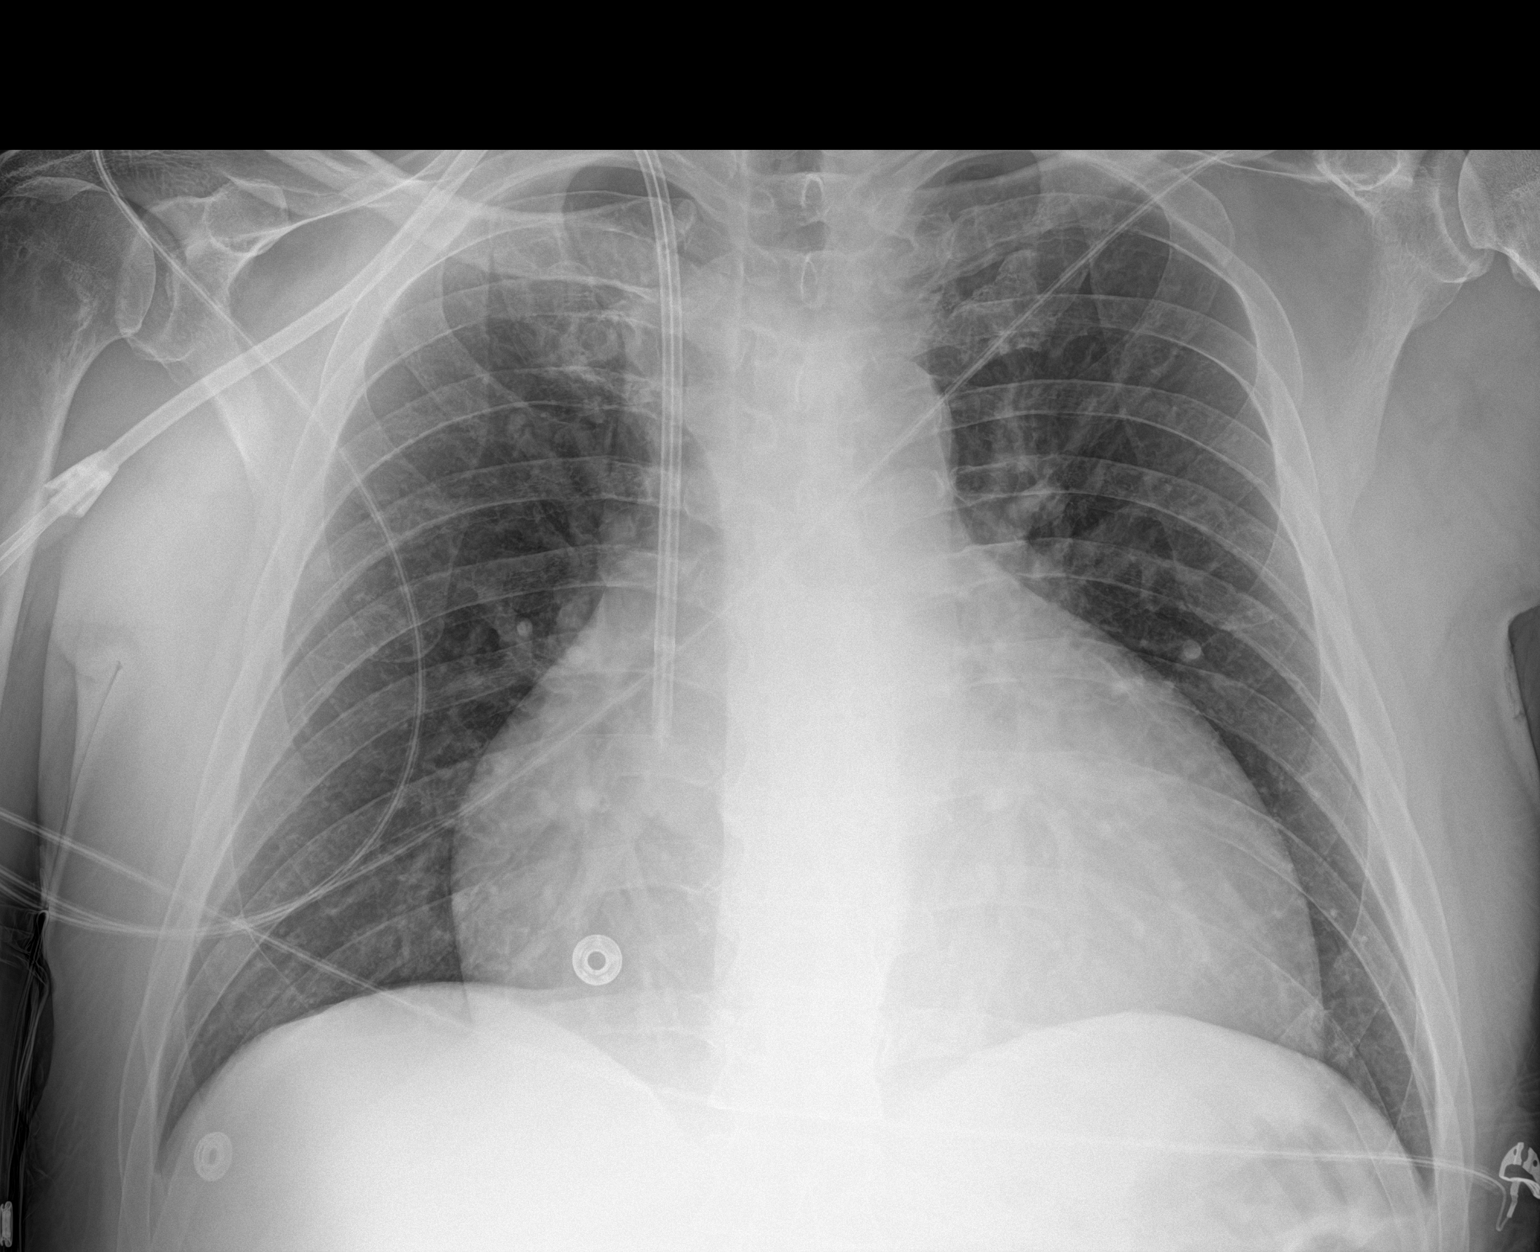

[1 of 1 positions shown; findings below may reference images not displayed]

FINDINGS: Right IJ approach double lumen central venous catheter is in place.
Tip of the catheter projects just within the right atrium. There is
no pneumothorax. Cardiomegaly is seen. No pulmonary edema. No
pleural effusion.
IMPRESSION: Dialysis catheter tip projects just within the right atrium.
Negative for pneumothorax.

Cardiomegaly without edema.

## 2018-05-01 ENCOUNTER — Other Ambulatory Visit: Payer: Self-pay | Admitting: Family Medicine

## 2018-05-09 ENCOUNTER — Other Ambulatory Visit: Payer: Self-pay | Admitting: Family Medicine

## 2018-05-14 ENCOUNTER — Other Ambulatory Visit (HOSPITAL_COMMUNITY): Payer: Self-pay | Admitting: Nephrology

## 2018-05-14 ENCOUNTER — Encounter: Payer: Self-pay | Admitting: Family Medicine

## 2018-05-14 DIAGNOSIS — R188 Other ascites: Secondary | ICD-10-CM

## 2018-05-14 MED ORDER — OXYCODONE-ACETAMINOPHEN 10-325 MG PO TABS: 1.0000 | ORAL_TABLET | ORAL | 0 refills | Status: DC | PRN

## 2018-05-17 ENCOUNTER — Ambulatory Visit (HOSPITAL_COMMUNITY)
Admission: RE | Admit: 2018-05-17 | Discharge: 2018-05-17 | Disposition: A | Discharge status: Home / Self Care | Payer: BLUE CROSS/BLUE SHIELD | Source: Ambulatory Visit | Attending: Nephrology | Admitting: Nephrology

## 2018-05-17 ENCOUNTER — Encounter (HOSPITAL_COMMUNITY): Payer: Self-pay | Admitting: Radiology

## 2018-05-17 DIAGNOSIS — Z992 Dependence on renal dialysis: Secondary | ICD-10-CM | POA: Insufficient documentation

## 2018-05-17 DIAGNOSIS — N186 End stage renal disease: Secondary | ICD-10-CM | POA: Diagnosis not present

## 2018-05-17 DIAGNOSIS — R188 Other ascites: Secondary | ICD-10-CM | POA: Diagnosis present

## 2018-05-17 DIAGNOSIS — K7460 Unspecified cirrhosis of liver: Secondary | ICD-10-CM | POA: Insufficient documentation

## 2018-05-17 HISTORY — PX: IR PARACENTESIS: IMG2679

## 2018-05-17 MED ORDER — LIDOCAINE HCL (PF) 2 % IJ SOLN
INTRAMUSCULAR | Status: AC | PRN
  Administered 2018-05-17: 10 mL

## 2018-05-17 MED ORDER — LIDOCAINE HCL (PF) 2 % IJ SOLN
INTRAMUSCULAR | Status: AC
  Filled 2018-05-17: qty 20

## 2018-05-17 NOTE — Procedures (Signed)
PROCEDURE SUMMARY:  Successful US guided paracentesis from RLQ.  Yielded 7.4 L of clear yellow fluid.  No immediate complications.  Pt tolerated well.   Specimen was not sent for labs.  Ascencion Dike PA-C 05/17/2018 11:36 AM

## 2018-05-29 ENCOUNTER — Other Ambulatory Visit (HOSPITAL_COMMUNITY): Payer: Self-pay | Admitting: Nephrology

## 2018-05-29 DIAGNOSIS — R188 Other ascites: Secondary | ICD-10-CM

## 2018-05-31 ENCOUNTER — Encounter (HOSPITAL_COMMUNITY): Payer: Self-pay | Admitting: Radiology

## 2018-05-31 ENCOUNTER — Ambulatory Visit (HOSPITAL_COMMUNITY)
Admission: RE | Admit: 2018-05-31 | Discharge: 2018-05-31 | Disposition: A | Discharge status: Home / Self Care | Payer: BLUE CROSS/BLUE SHIELD | Source: Ambulatory Visit | Attending: Nephrology | Admitting: Nephrology

## 2018-05-31 DIAGNOSIS — R188 Other ascites: Secondary | ICD-10-CM

## 2018-05-31 HISTORY — PX: IR PARACENTESIS: IMG2679

## 2018-05-31 MED ORDER — LIDOCAINE HCL (PF) 2 % IJ SOLN
INTRAMUSCULAR | Status: AC
  Filled 2018-05-31: qty 20

## 2018-05-31 MED ORDER — LIDOCAINE HCL (PF) 2 % IJ SOLN
INTRAMUSCULAR | Status: AC | PRN
  Administered 2018-05-31: 10 mL

## 2018-05-31 NOTE — Procedures (Signed)
Ultrasound-guided therapeutic paracentesis performed yielding 7.4 liters of yellow fluid. No immediate complications.

## 2018-06-12 ENCOUNTER — Encounter (HOSPITAL_COMMUNITY): Payer: Self-pay | Admitting: Physician Assistant

## 2018-06-12 ENCOUNTER — Ambulatory Visit (HOSPITAL_COMMUNITY): Admission: RE | Admit: 2018-06-12 | Payer: BLUE CROSS/BLUE SHIELD | Source: Ambulatory Visit

## 2018-06-12 ENCOUNTER — Ambulatory Visit (HOSPITAL_COMMUNITY)
Admission: RE | Admit: 2018-06-12 | Discharge: 2018-06-12 | Disposition: A | Discharge status: Home / Self Care | Payer: BLUE CROSS/BLUE SHIELD | Source: Ambulatory Visit | Attending: Nephrology | Admitting: Nephrology

## 2018-06-12 DIAGNOSIS — R188 Other ascites: Secondary | ICD-10-CM | POA: Insufficient documentation

## 2018-06-12 DIAGNOSIS — N186 End stage renal disease: Secondary | ICD-10-CM | POA: Insufficient documentation

## 2018-06-12 DIAGNOSIS — I509 Heart failure, unspecified: Secondary | ICD-10-CM | POA: Diagnosis not present

## 2018-06-12 DIAGNOSIS — Z992 Dependence on renal dialysis: Secondary | ICD-10-CM | POA: Diagnosis not present

## 2018-06-12 HISTORY — PX: IR PARACENTESIS: IMG2679

## 2018-06-12 MED ORDER — LIDOCAINE HCL 1 % IJ SOLN
INTRAMUSCULAR | Status: AC | PRN
  Administered 2018-06-12: 10 mL

## 2018-06-12 MED ORDER — LIDOCAINE HCL 1 % IJ SOLN
INTRAMUSCULAR | Status: AC
  Filled 2018-06-12: qty 20

## 2018-06-12 NOTE — Procedures (Signed)
PROCEDURE SUMMARY:  Successful image-guided paracentesis from the right lower abdomen.  Yielded 5.5 liters of dark yellow fluid.  No immediate complications.  Patient tolerated well.   Specimen was not sent for labs.  Joaquim Nam PA-C 06/12/2018 2:43 PM

## 2018-06-22 ENCOUNTER — Other Ambulatory Visit (HOSPITAL_COMMUNITY): Payer: Self-pay | Admitting: Nephrology

## 2018-06-22 DIAGNOSIS — E8770 Fluid overload, unspecified: Secondary | ICD-10-CM

## 2018-06-26 ENCOUNTER — Ambulatory Visit (HOSPITAL_COMMUNITY)
Admission: RE | Admit: 2018-06-26 | Discharge: 2018-06-26 | Disposition: A | Discharge status: Home / Self Care | Payer: BLUE CROSS/BLUE SHIELD | Source: Ambulatory Visit | Attending: Nephrology | Admitting: Nephrology

## 2018-06-26 ENCOUNTER — Encounter (HOSPITAL_COMMUNITY): Payer: Self-pay | Admitting: Student

## 2018-06-26 DIAGNOSIS — E8770 Fluid overload, unspecified: Secondary | ICD-10-CM

## 2018-06-26 DIAGNOSIS — R188 Other ascites: Secondary | ICD-10-CM | POA: Diagnosis present

## 2018-06-26 HISTORY — PX: IR PARACENTESIS: IMG2679

## 2018-06-26 MED ORDER — LIDOCAINE HCL 1 % IJ SOLN
INTRAMUSCULAR | Status: DC | PRN
  Administered 2018-06-26: 10 mL

## 2018-06-26 MED ORDER — LIDOCAINE HCL 1 % IJ SOLN
INTRAMUSCULAR | Status: AC
  Filled 2018-06-26: qty 20

## 2018-06-26 NOTE — Procedures (Signed)
PROCEDURE SUMMARY:  Successful US guided paracentesis from right lateral abomen.  Yielded 7.5 liters of yellow fluid.  No immediate complications.  Pt tolerated well.   Specimen was not sent for labs.  Docia Barrier PA-C 06/26/2018 11:35 AM

## 2018-06-28 ENCOUNTER — Encounter: Payer: Self-pay | Admitting: Family Medicine

## 2018-06-29 MED ORDER — OXYCODONE-ACETAMINOPHEN 10-325 MG PO TABS: 1.0000 | ORAL_TABLET | ORAL | 0 refills | Status: DC | PRN

## 2018-07-05 ENCOUNTER — Ambulatory Visit: Payer: Self-pay | Admitting: Family Medicine

## 2018-07-09 ENCOUNTER — Other Ambulatory Visit (HOSPITAL_COMMUNITY): Payer: Self-pay | Admitting: Nephrology

## 2018-07-09 DIAGNOSIS — E8770 Fluid overload, unspecified: Secondary | ICD-10-CM

## 2018-07-10 ENCOUNTER — Encounter (HOSPITAL_COMMUNITY): Payer: Self-pay | Admitting: Physician Assistant

## 2018-07-10 ENCOUNTER — Ambulatory Visit (HOSPITAL_COMMUNITY)
Admission: RE | Admit: 2018-07-10 | Discharge: 2018-07-10 | Disposition: A | Discharge status: Home / Self Care | Payer: BLUE CROSS/BLUE SHIELD | Source: Ambulatory Visit | Attending: Nephrology | Admitting: Nephrology

## 2018-07-10 DIAGNOSIS — Z992 Dependence on renal dialysis: Secondary | ICD-10-CM | POA: Diagnosis not present

## 2018-07-10 DIAGNOSIS — N186 End stage renal disease: Secondary | ICD-10-CM | POA: Diagnosis not present

## 2018-07-10 DIAGNOSIS — R188 Other ascites: Secondary | ICD-10-CM | POA: Diagnosis not present

## 2018-07-10 DIAGNOSIS — I509 Heart failure, unspecified: Secondary | ICD-10-CM | POA: Insufficient documentation

## 2018-07-10 DIAGNOSIS — E8770 Fluid overload, unspecified: Secondary | ICD-10-CM

## 2018-07-10 HISTORY — PX: IR PARACENTESIS: IMG2679

## 2018-07-10 MED ORDER — LIDOCAINE HCL (PF) 1 % IJ SOLN
INTRAMUSCULAR | Status: DC | PRN
  Administered 2018-07-10: 10 mL

## 2018-07-10 MED ORDER — LIDOCAINE HCL 1 % IJ SOLN
INTRAMUSCULAR | Status: AC
  Filled 2018-07-10: qty 20

## 2018-07-10 NOTE — Procedures (Signed)
PROCEDURE SUMMARY:  Successful image-guided paracentesis from the right lower abdomen.  Yielded 6.3 liters of clear yellow fluid.  No immediate complications.  Patient tolerated well.   Specimen was not sent for labs.  Joaquim Nam PA-C 07/10/2018 10:46 AM

## 2018-07-22 ENCOUNTER — Other Ambulatory Visit: Payer: Self-pay | Admitting: Family Medicine

## 2018-07-25 ENCOUNTER — Other Ambulatory Visit (HOSPITAL_COMMUNITY): Payer: Self-pay | Admitting: Nephrology

## 2018-07-25 DIAGNOSIS — R188 Other ascites: Secondary | ICD-10-CM

## 2018-07-30 ENCOUNTER — Ambulatory Visit (HOSPITAL_COMMUNITY)
Admission: RE | Admit: 2018-07-30 | Discharge: 2018-07-30 | Disposition: A | Discharge status: Home / Self Care | Payer: BLUE CROSS/BLUE SHIELD | Source: Ambulatory Visit | Attending: Nephrology | Admitting: Nephrology

## 2018-07-30 ENCOUNTER — Encounter (HOSPITAL_COMMUNITY): Payer: Self-pay | Admitting: Physician Assistant

## 2018-07-30 DIAGNOSIS — N186 End stage renal disease: Secondary | ICD-10-CM | POA: Diagnosis not present

## 2018-07-30 DIAGNOSIS — Z992 Dependence on renal dialysis: Secondary | ICD-10-CM | POA: Diagnosis not present

## 2018-07-30 DIAGNOSIS — R188 Other ascites: Secondary | ICD-10-CM | POA: Diagnosis not present

## 2018-07-30 DIAGNOSIS — I509 Heart failure, unspecified: Secondary | ICD-10-CM | POA: Insufficient documentation

## 2018-07-30 HISTORY — PX: IR PARACENTESIS: IMG2679

## 2018-07-30 MED ORDER — LIDOCAINE HCL (PF) 1 % IJ SOLN
INTRAMUSCULAR | Status: DC | PRN
  Administered 2018-07-30: 10 mL

## 2018-07-30 MED ORDER — LIDOCAINE HCL 1 % IJ SOLN
INTRAMUSCULAR | Status: AC
  Filled 2018-07-30: qty 20

## 2018-07-30 NOTE — Procedures (Signed)
PROCEDURE SUMMARY:  Successful image-guided paracentesis from the right lower abdomen.  Yielded 5 liters of clear yellow fluid.  No immediate complications.  Patient tolerated well.   Specimen was not sent for labs.  Please see imaging section of Epic for full dictation.  Joaquim Nam PA-C 07/30/2018 11:42 AM

## 2018-07-31 ENCOUNTER — Other Ambulatory Visit: Payer: Self-pay | Admitting: *Deleted

## 2018-07-31 MED ORDER — ROPINIROLE HCL 0.25 MG PO TABS: ORAL_TABLET | ORAL | 1 refills | Status: DC

## 2018-07-31 NOTE — Telephone Encounter (Signed)
Pt thought that he had an appointment today. He scheduled an appointment before leaving the office today for 08/07/2018. Routing to pcp for signature.Maryruth Eve, Lahoma Crocker, CMA

## 2018-08-01 MED ORDER — OXYCODONE-ACETAMINOPHEN 10-325 MG PO TABS: 1.0000 | ORAL_TABLET | ORAL | 0 refills | Status: DC | PRN

## 2018-08-07 ENCOUNTER — Other Ambulatory Visit: Payer: Self-pay | Admitting: Family Medicine

## 2018-08-07 ENCOUNTER — Ambulatory Visit: Payer: Self-pay | Admitting: Family Medicine

## 2018-08-07 NOTE — Progress Notes (Deleted)
Established Patient Office Visit  Subjective:  Patient ID: James Burke, male    DOB: May 29, 1966  Age: 52 y.o. MRN: [id number]  CC: No chief complaint on file.   HPI James Burke presents for chronic pain management.  He is followed for chronic knee and foot pain.  He has Charcot foot secondary to his previously poorly controlled diabetes.  He typically takes an extra tab after dialysis.  He tends to feel like he is going through withdrawals after dialysis.  Diabetes - no hypoglycemic events. No wounds or sores that are not healing well. No increased thirst or urination. Checking glucose at home. Taking medications as prescribed without any side effects.  We actually discontinued his glipizide when I saw him 3 months ago.  With the hope that we would continue to be able to wean his medications he is still taking Trulicity.   Past Medical History:  Diagnosis Date  . Anemia   . 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 C. difficile colitis 04/18/2008  . Congestive heart failure ([place])   . Diabetes mellitus    Type II  . DVT (deep venous thrombosis) ([facility])    right leg   . Dyspnea    "when I have too much fluid.'  . ESRD (end stage renal disease) ([place])    Tues, Thurs  . GERD (gastroesophageal reflux disease)   . History of blood transfusion 2017  . Pneumonia     Past Surgical History:  Procedure Laterality Date  . APPENDECTOMY  1995  . BASCILIC VEIN TRANSPOSITION Left 01/03/2017   Procedure: FIRST STAGE BRACHIAL VEIN TRANSPOSITION;  Surgeon: Conrad Malabar, MD;  Location: [facility];  Service: Vascular;  Laterality: Left;  . BASCILIC VEIN TRANSPOSITION Left 04/24/2017   Procedure: LEFT SECOND STAGE BRACHIAL VEIN TRANSPOSITION;  Surgeon: Conrad , MD;  Location: [facility];  Service: Vascular;  Laterality: Left;  . EYE SURGERY    . INSERTION OF DIALYSIS CATHETER Right 01/03/2017   Procedure: INSERTION OF DIALYSIS CATHETER RIGHT INTERNAL JUGULAR;  Surgeon: Conrad , MD;  Location: [facility];   Service: Vascular;  Laterality: Right;  . IR PARACENTESIS  12/14/2017  . IR PARACENTESIS  02/01/2018  . IR PARACENTESIS  03/28/2018  . IR PARACENTESIS  05/17/2018  . IR PARACENTESIS  05/31/2018  . IR PARACENTESIS  06/12/2018  . IR PARACENTESIS  06/26/2018  . IR PARACENTESIS  07/10/2018  . IR PARACENTESIS  07/30/2018  . KNEE SURGERY Left 04/2014  . [place]  Bilateral   . PERICARDIOCENTESIS N/A 01/03/2017   Procedure: Pericardiocentesis;  Surgeon: Burnell Blanks, MD;  Location: [facility];  Service: Cardiovascular;  Laterality: N/A;    Family History  Problem Relation Age of Onset  . Heart disease Unknown        No family history  . Cancer Brother     Social History   Socioeconomic History  . Marital status: Married    Spouse name: Not on file  . Number of children: 3  . Years of education: Not on file  . Highest education level: Not on file  Occupational History  . Not on file  Social Needs  . Financial resource strain: Not on file  . Food insecurity:    Worry: Not on file    Inability: Not on file  . Transportation needs:    Medical: Not on file    Non-medical: Not on file  Tobacco Use  . Smoking status: Never Smoker  .  Smokeless tobacco: Former Systems developer    Types: Chew  . Tobacco comment: no chewing 01/2017  Substance and Sexual Activity  . Alcohol use: No    Alcohol/week: 0.0 standard drinks  . Drug use: No  . Sexual activity: Yes  Lifestyle  . Physical activity:    Days per week: Not on file    Minutes per session: Not on file  . Stress: Not on file  Relationships  . Social connections:    Talks on phone: Not on file    Gets together: Not on file    Attends religious service: Not on file    Active member of club or organization: Not on file    Attends meetings of clubs or organizations: Not on file    Relationship status: Not on file  . Intimate partner violence:    Fear of current or ex partner: Not on file    Emotionally abused: Not on file     Physically abused: Not on file    Forced sexual activity: Not on file  Other Topics Concern  . Not on file  Social History Narrative  . Not on file    Outpatient Medications Prior to Visit  Medication Sig Dispense Refill  . amLODipine (NORVASC) 10 MG tablet TAKE 1 TABLET BY MOUTH EVERY DAY 90 tablet 3  . aspirin 81 MG tablet Take 81 mg by mouth at bedtime.     Lorin Picket 1 GM 210 MG(Fe) tablet TAKE 2 TABLETS 3 TIMES A DAY WITH FOOD  0  . b complex-vitamin c-folic acid (NEPHRO-VITE) 0.8 MG TABS tablet Take 1 tablet by mouth daily.  6  . carvedilol (COREG) 25 MG tablet TAKE 1 TABLET TWICE A DAY WITH FOOD 180 tablet 1  . docusate sodium (COLACE) 100 MG capsule Take 1 capsule (100 mg total) by mouth at bedtime.    . furosemide (LASIX) 80 MG tablet Take 80 mg by mouth 2 (two) times daily.  3  . gabapentin (NEURONTIN) 100 MG capsule Take 2 capsules (200 mg total) by mouth daily as needed (Give one hour prior to hemodialysis session as needed for anxiety/restlessness). 15 capsule 0  . hydrALAZINE (APRESOLINE) 25 MG tablet TAKE 1 TABLET (25 MG TOTAL) 3 (THREE) TIMES DAILY BY MOUTH. 90 tablet 1  . hydrOXYzine (ATARAX/VISTARIL) 25 MG tablet TAKE 1-2 TABLETS (25-50 MG TOTAL) BY MOUTH DAILY AS NEEDED. 180 tablet 0  . isosorbide mononitrate (IMDUR) 30 MG 24 hr tablet TAKE 1 TABLET BY MOUTH EVERY DAY 90 tablet 3  . lidocaine-prilocaine (EMLA) cream APPLY THREE TIMES A WEEK AS DIRECTED 1 HOUR PRIOR TO DIALYSIS AND WRAP WITH PLASTIC WRAP  3  . omeprazole (PRILOSEC) 40 MG capsule TAKE 1 CAPSULE BY MOUTH EVERY DAY 90 capsule 1  . oxyCODONE-acetaminophen (PERCOCET) 10-325 MG tablet Take 1 tablet by mouth every 4 (four) hours as needed for pain. 180 tablet 0  . polyethylene glycol powder (MIRALAX) powder Take 17 g by mouth daily as needed for moderate constipation.    Marland Kitchen rOPINIRole (REQUIP) 0.25 MG tablet Take 1-2 tablets (0.25-0.5 mg total) by mouth at bedtime. 180 tablet 1  . tamsulosin (FLOMAX) 0.4 MG CAPS  capsule TAKE 2 CAPSULES BY MOUTH DAILY AFTER SUPPER 180 capsule 2  . traZODone (DESYREL) 50 MG tablet TAKE 1 TABLET AT BEDTIME 90 tablet 1  . TRULICITY 4.19 FX/9.0WI SOPN INJECT 0.75MG EVERY 7 DAYS 6 pen 6  . zolpidem (AMBIEN) 10 MG tablet TAKE 1 TABLET AT BEDTIME 90 tablet  0   No facility-administered medications prior to visit.     Allergies  Allergen Reactions  . Prednisone Other (See Comments)    BLINDNESS > [ ? CSCR ? ]  . Simvastatin Other (See Comments)    MYALGIAS, JOINT PAIN  . Lorazepam Anxiety and Other (See Comments)    Nervousness and "fidgets"; legs "need to kick, "electrical currents"  . Methocarbamol Diarrhea  . Seroquel [Quetiapine Fumarate] Other (See Comments)    QT prolongation    ROS Review of Systems    Objective:    Physical Exam  There were no vitals taken for this visit. Wt Readings from Last 3 Encounters:  03/29/18 178 lb (80.7 kg)  08/02/17 180 lb 8.9 oz (81.9 kg)  06/15/17 202 lb (91.6 kg)     There are no preventive care reminders to display for this patient.  There are no preventive care reminders to display for this patient.  Lab Results  Component Value Date   TSH 2.218 12/30/2016   Lab Results  Component Value Date   WBC 6.0 07/29/2017   HGB 10.9 (L) 07/29/2017   HCT 32.9 (L) 07/29/2017   MCV 85.9 07/29/2017   PLT 161 07/29/2017   Lab Results  Component Value Date   NA 133 (L) 08/01/2017   K 3.8 08/01/2017   CO2 27 08/01/2017   GLUCOSE 109 (H) 08/01/2017   BUN 23 (H) 08/01/2017   CREATININE 4.46 (H) 08/01/2017   BILITOT 0.7 04/06/2018   ALKPHOS 131 (H) 07/26/2017   AST 14 04/06/2018   ALT 11 04/06/2018   PROT 6.5 04/06/2018   ALBUMIN 3.0 (L) 07/31/2017   CALCIUM 8.3 (L) 08/01/2017   ANIONGAP 10 08/01/2017   EGFR 26.0 04/16/2014   Lab Results  Component Value Date   CHOL 115 04/06/2018   Lab Results  Component Value Date   HDL 31 (L) 04/06/2018   Lab Results  Component Value Date   LDLCALC 62 04/06/2018    Lab Results  Component Value Date   TRIG 141 04/06/2018   Lab Results  Component Value Date   CHOLHDL 3.7 04/06/2018   Lab Results  Component Value Date   HGBA1C 6.1 (A) 03/29/2018      Assessment & Plan:   Problem List Items Addressed This Visit      Endocrine   Controlled type 2 diabetes with renal manifestation ([place]) - Primary (Chronic)   Charcot foot due to diabetes mellitus ([place])     Other   Encounter for chronic pain management      No orders of the defined types were placed in this encounter.   Follow-up: No follow-ups on file.    [name], MD

## 2018-08-23 ENCOUNTER — Ambulatory Visit: Payer: BLUE CROSS/BLUE SHIELD | Admitting: Family Medicine

## 2018-08-23 ENCOUNTER — Encounter: Payer: Self-pay | Admitting: Family Medicine

## 2018-08-23 VITALS — BP 137/83 | HR 70 | Ht 72.0 in | Wt 205.0 lb

## 2018-08-23 DIAGNOSIS — Z794 Long term (current) use of insulin: Secondary | ICD-10-CM

## 2018-08-23 DIAGNOSIS — I10 Essential (primary) hypertension: Secondary | ICD-10-CM | POA: Diagnosis not present

## 2018-08-23 DIAGNOSIS — E1122 Type 2 diabetes mellitus with diabetic chronic kidney disease: Secondary | ICD-10-CM

## 2018-08-23 DIAGNOSIS — E11610 Type 2 diabetes mellitus with diabetic neuropathic arthropathy: Secondary | ICD-10-CM

## 2018-08-23 DIAGNOSIS — G8929 Other chronic pain: Secondary | ICD-10-CM

## 2018-08-23 DIAGNOSIS — I5042 Chronic combined systolic (congestive) and diastolic (congestive) heart failure: Secondary | ICD-10-CM

## 2018-08-23 DIAGNOSIS — Z8619 Personal history of other infectious and parasitic diseases: Secondary | ICD-10-CM | POA: Insufficient documentation

## 2018-08-23 LAB — POCT GLYCOSYLATED HEMOGLOBIN (HGB A1C): Hemoglobin A1C: 6.1 % — AB (ref 4.0–5.6)

## 2018-08-23 MED ORDER — OXYCODONE-ACETAMINOPHEN 10-325 MG PO TABS: 1.0000 | ORAL_TABLET | ORAL | 0 refills | Status: DC | PRN

## 2018-08-23 NOTE — Progress Notes (Signed)
Subjective:    CC: BP  HPI:  Hypertension- Pt denies chest pain, SOB, dizziness, or heart palpitations.  Taking meds as directed w/o problems.  Denies medication side effects.    F/U Heart failure -he is doing well overall.  He is not had any recent flares or exacerbations.  Has been getting paracentesis every few weeks.  He was able to go 20 days more recently and did really well.  Encounter for chronic pain management-left knee and left foot chronic pain.- He normally takes 1 or 2 tabs at bedtime and then they have been having him take 1 tab right after dialysis on Monday Wednesdays and Fridays because the dialysis will washout the remaining narcotic from his system and he would actually start to get a most like a withdrawal he would start to shake after dialysis.  This regimen seems to be working well for him.  Diabetes - no hypoglycemic events. No wounds or sores that are not healing well. No increased thirst or urination. Checking glucose at home. Taking medications as prescribed without any side effects.   Past medical history, Surgical history, Family history not pertinant except as noted below, Social history, Allergies, and medications have been entered into the medical record, reviewed, and corrections made.   Review of Systems: No fevers, chills, night sweats, weight loss, chest pain, or shortness of breath.   Objective:    General: Well Developed, well nourished, and in no acute distress.  Neuro: Alert and oriented x3, extra-ocular muscles intact, sensation grossly intact.  HEENT: Normocephalic, atraumatic  Skin: Warm and dry, no rashes. Cardiac: Regular rate and rhythm, no murmurs rubs or gallops, no lower extremity edema.  Respiratory: Clear to auscultation bilaterally. Not using accessory muscles, speaking in full sentences.   Impression and Recommendations:   HTN  - Well controlled. Continue current regimen. Follow up in  4 months.    Heart failure-sign of volume  overload.  Though he is getting frequent paracentesis abdominal ascites.  Chronic pain management for chronic back and Charcot foot pain-due for drug screen today.  He was unable to give enough urine but he only urinates about twice a day now that he is on chronic dialysis.  Pain medications refilled today call if any problems or concerns we will continue with current regimen.  It seems to be working well for him. Diabetes- Well controlled. Continue current regimen. Follow up in  4 months.

## 2018-08-24 ENCOUNTER — Other Ambulatory Visit (HOSPITAL_COMMUNITY): Payer: Self-pay | Admitting: Nephrology

## 2018-08-24 ENCOUNTER — Encounter: Payer: Self-pay | Admitting: Family Medicine

## 2018-08-24 DIAGNOSIS — E8770 Fluid overload, unspecified: Secondary | ICD-10-CM

## 2018-08-24 DIAGNOSIS — R188 Other ascites: Secondary | ICD-10-CM

## 2018-08-27 LAB — PAIN MGMT, PROFILE 5 W/O MEDMATCH U
Amphetamines: NEGATIVE ng/mL (ref <500)
Barbiturates: NEGATIVE ng/mL (ref <300)
Benzodiazepines: NEGATIVE ng/mL (ref <100)
Cocaine Metabolite: NEGATIVE ng/mL (ref <150)
Codeine: NEGATIVE ng/mL (ref <50)
Creatinine: 49.7 mg/dL
Hydrocodone: NEGATIVE ng/mL (ref <50)
Hydromorphone: NEGATIVE ng/mL (ref <50)
Marijuana Metabolite: NEGATIVE ng/mL (ref <20)
Methadone Metabolite: NEGATIVE ng/mL (ref <100)
Morphine: NEGATIVE ng/mL (ref <50)
Norhydrocodone: NEGATIVE ng/mL (ref <50)
Noroxycodone: 494 ng/mL — ABNORMAL HIGH (ref <50)
Opiates: NEGATIVE ng/mL (ref <100)
Oxidant: NEGATIVE ug/mL (ref <200)
Oxycodone: 964 ng/mL — ABNORMAL HIGH (ref <50)
Oxycodone: POSITIVE ng/mL — AB (ref <100)
Oxymorphone: 1286 ng/mL — ABNORMAL HIGH (ref <50)
pH: 6.4 (ref 4.5–9.0)

## 2018-08-30 ENCOUNTER — Ambulatory Visit (HOSPITAL_COMMUNITY)
Admission: RE | Admit: 2018-08-30 | Discharge: 2018-08-30 | Disposition: A | Discharge status: Home / Self Care | Payer: BLUE CROSS/BLUE SHIELD | Source: Ambulatory Visit | Attending: Nephrology | Admitting: Nephrology

## 2018-08-30 ENCOUNTER — Encounter: Payer: Self-pay | Admitting: Physician Assistant

## 2018-08-30 DIAGNOSIS — R188 Other ascites: Secondary | ICD-10-CM | POA: Insufficient documentation

## 2018-08-30 DIAGNOSIS — E8770 Fluid overload, unspecified: Secondary | ICD-10-CM

## 2018-08-30 HISTORY — PX: IR PARACENTESIS: IMG2679

## 2018-08-30 MED ORDER — LIDOCAINE HCL 1 % IJ SOLN
INTRAMUSCULAR | Status: DC | PRN
  Administered 2018-08-30: 10 mL

## 2018-08-30 MED ORDER — LIDOCAINE HCL 1 % IJ SOLN
INTRAMUSCULAR | Status: AC
  Filled 2018-08-30: qty 20

## 2018-08-30 NOTE — Procedures (Signed)
PROCEDURE SUMMARY:  Successful image-guided paracentesis from the right lower abdomen.  Yielded 7.0 liters of clear yellow fluid.  No immediate complications.  EBL: zero Patient tolerated well.   Specimen was not sent for labs.  Please see imaging section of Epic for full dictation.  Joaquim Nam PA-C 08/30/2018 1:59 PM

## 2018-09-06 ENCOUNTER — Other Ambulatory Visit: Payer: Self-pay | Admitting: Family Medicine

## 2018-09-10 ENCOUNTER — Other Ambulatory Visit: Payer: Self-pay | Admitting: Nephrology

## 2018-09-10 ENCOUNTER — Other Ambulatory Visit (HOSPITAL_COMMUNITY): Payer: Self-pay | Admitting: Nephrology

## 2018-09-10 DIAGNOSIS — R188 Other ascites: Secondary | ICD-10-CM

## 2018-09-11 ENCOUNTER — Encounter: Payer: Self-pay | Admitting: Family Medicine

## 2018-09-12 ENCOUNTER — Other Ambulatory Visit: Payer: Self-pay | Admitting: Family Medicine

## 2018-09-12 MED ORDER — FUROSEMIDE 80 MG PO TABS: 80.0000 mg | ORAL_TABLET | Freq: Two times a day (BID) | ORAL | 3 refills | Status: DC

## 2018-09-13 ENCOUNTER — Encounter (HOSPITAL_COMMUNITY): Payer: Self-pay | Admitting: Radiology

## 2018-09-13 ENCOUNTER — Ambulatory Visit (HOSPITAL_COMMUNITY)
Admission: RE | Admit: 2018-09-13 | Discharge: 2018-09-13 | Disposition: A | Discharge status: Home / Self Care | Payer: BLUE CROSS/BLUE SHIELD | Source: Ambulatory Visit | Attending: Nephrology | Admitting: Nephrology

## 2018-09-13 DIAGNOSIS — R188 Other ascites: Secondary | ICD-10-CM | POA: Diagnosis present

## 2018-09-13 HISTORY — PX: IR PARACENTESIS: IMG2679

## 2018-09-13 MED ORDER — LIDOCAINE HCL 1 % IJ SOLN
INTRAMUSCULAR | Status: AC
  Filled 2018-09-13: qty 20

## 2018-09-13 MED ORDER — LIDOCAINE HCL 1 % IJ SOLN
INTRAMUSCULAR | Status: DC | PRN
  Administered 2018-09-13: 10 mL

## 2018-09-13 NOTE — Procedures (Signed)
Ultrasound-guided therapeutic paracentesis performed yielding 6.9 liters of yellow fluid. No immediate complications. EBL< 1cc.

## 2018-09-25 ENCOUNTER — Other Ambulatory Visit (HOSPITAL_COMMUNITY): Payer: Self-pay | Admitting: Nephrology

## 2018-09-25 ENCOUNTER — Other Ambulatory Visit: Payer: Self-pay | Admitting: Nephrology

## 2018-09-25 DIAGNOSIS — R188 Other ascites: Secondary | ICD-10-CM

## 2018-09-27 ENCOUNTER — Ambulatory Visit (HOSPITAL_COMMUNITY)
Admission: RE | Admit: 2018-09-27 | Discharge: 2018-09-27 | Disposition: A | Discharge status: Home / Self Care | Payer: BLUE CROSS/BLUE SHIELD | Source: Ambulatory Visit | Attending: Nephrology | Admitting: Nephrology

## 2018-09-27 ENCOUNTER — Encounter (HOSPITAL_COMMUNITY): Payer: Self-pay | Admitting: Physician Assistant

## 2018-09-27 DIAGNOSIS — R188 Other ascites: Secondary | ICD-10-CM | POA: Insufficient documentation

## 2018-09-27 HISTORY — PX: IR PARACENTESIS: IMG2679

## 2018-09-27 MED ORDER — LIDOCAINE HCL 1 % IJ SOLN
INTRAMUSCULAR | Status: AC
  Filled 2018-09-27: qty 20

## 2018-09-27 MED ORDER — LIDOCAINE HCL 1 % IJ SOLN
INTRAMUSCULAR | Status: DC | PRN
  Administered 2018-09-27: 10 mL

## 2018-09-27 NOTE — Procedures (Signed)
PROCEDURE SUMMARY:  Successful image-guided paracentesis from the right lower abdomen.  Yielded 5.1 liters of amber fluid.  No immediate complications.  EBL: zero Patient tolerated well.   Specimen was not sent for labs.  Please see imaging section of Epic for full dictation.  Joaquim Nam PA-C 09/27/2018 2:04 PM

## 2018-10-05 ENCOUNTER — Encounter (HOSPITAL_COMMUNITY): Payer: Self-pay | Admitting: Physician Assistant

## 2018-10-09 ENCOUNTER — Other Ambulatory Visit (HOSPITAL_COMMUNITY): Payer: Self-pay | Admitting: Chiropractic Medicine

## 2018-10-09 ENCOUNTER — Other Ambulatory Visit: Payer: Self-pay | Admitting: Chiropractic Medicine

## 2018-10-09 DIAGNOSIS — R188 Other ascites: Secondary | ICD-10-CM

## 2018-10-11 ENCOUNTER — Ambulatory Visit (HOSPITAL_COMMUNITY)
Admission: RE | Admit: 2018-10-11 | Discharge: 2018-10-11 | Disposition: A | Discharge status: Home / Self Care | Payer: BLUE CROSS/BLUE SHIELD | Source: Ambulatory Visit | Attending: Chiropractic Medicine | Admitting: Chiropractic Medicine

## 2018-10-11 ENCOUNTER — Encounter (HOSPITAL_COMMUNITY): Payer: Self-pay | Admitting: Radiology

## 2018-10-11 DIAGNOSIS — R188 Other ascites: Secondary | ICD-10-CM | POA: Diagnosis not present

## 2018-10-11 HISTORY — PX: IR PARACENTESIS: IMG2679

## 2018-10-11 MED ORDER — LIDOCAINE HCL (PF) 1 % IJ SOLN
INTRAMUSCULAR | Status: AC | PRN
  Administered 2018-10-11: 10 mL

## 2018-10-11 NOTE — Procedures (Signed)
PROCEDURE SUMMARY:  Successful US guided paracentesis from RLQ.  Yielded 5.7 L of clear yellow fluid.  No immediate complications.  Pt tolerated well.   Specimen was not sent for labs.  EBL < 67mL  Ascencion Dike PA-C 10/11/2018 2:29 PM

## 2018-10-13 ENCOUNTER — Other Ambulatory Visit: Payer: Self-pay | Admitting: Family Medicine

## 2018-10-23 ENCOUNTER — Encounter: Payer: Self-pay | Admitting: Family Medicine

## 2018-10-24 MED ORDER — OXYCODONE-ACETAMINOPHEN 10-325 MG PO TABS: 1.0000 | ORAL_TABLET | ORAL | 0 refills | Status: DC | PRN

## 2018-10-26 ENCOUNTER — Other Ambulatory Visit (HOSPITAL_COMMUNITY): Payer: Self-pay | Admitting: Nephrology

## 2018-10-26 ENCOUNTER — Encounter: Payer: Self-pay | Admitting: Family Medicine

## 2018-10-26 DIAGNOSIS — R188 Other ascites: Secondary | ICD-10-CM

## 2018-10-29 DIAGNOSIS — E441 Mild protein-calorie malnutrition: Secondary | ICD-10-CM | POA: Insufficient documentation

## 2018-10-30 ENCOUNTER — Encounter (HOSPITAL_COMMUNITY): Payer: Self-pay | Admitting: Student

## 2018-10-30 ENCOUNTER — Ambulatory Visit (HOSPITAL_COMMUNITY)
Admission: RE | Admit: 2018-10-30 | Discharge: 2018-10-30 | Disposition: A | Discharge status: Home / Self Care | Payer: BLUE CROSS/BLUE SHIELD | Source: Ambulatory Visit | Attending: Nephrology | Admitting: Nephrology

## 2018-10-30 DIAGNOSIS — R188 Other ascites: Secondary | ICD-10-CM

## 2018-10-30 HISTORY — PX: IR PARACENTESIS: IMG2679

## 2018-10-30 MED ORDER — LIDOCAINE HCL 1 % IJ SOLN
INTRAMUSCULAR | Status: AC
  Filled 2018-10-30: qty 20

## 2018-10-30 MED ORDER — LIDOCAINE HCL (PF) 1 % IJ SOLN
INTRAMUSCULAR | Status: DC | PRN
  Administered 2018-10-30: 10 mL

## 2018-10-30 NOTE — Procedures (Signed)
PROCEDURE SUMMARY:  Successful US guided paracentesis from right lateral abdomen.  Yielded 5.1 of yellow fluid.  No immediate complications.  Pt tolerated well.   Specimen was not sent for labs.  EBL < 32mL  Docia Barrier PA-C 10/30/2018 2:38 PM

## 2018-11-12 ENCOUNTER — Encounter: Payer: Self-pay | Admitting: Family Medicine

## 2018-11-13 MED ORDER — LIDOCAINE-PRILOCAINE 2.5-2.5 % EX CREA: TOPICAL_CREAM | CUTANEOUS | 3 refills | Status: DC

## 2018-11-17 IMAGING — US US RENAL
1 series · 14 of 25 positions shown · non-contrast
Comparison: None.

CLINICAL DATA: Acute renal injury

EXAM:
RENAL / URINARY TRACT ULTRASOUND COMPLETE

[Series 1: us renal · 0.28mm/px · 14 of 32 slices shown]
[im 1/32]
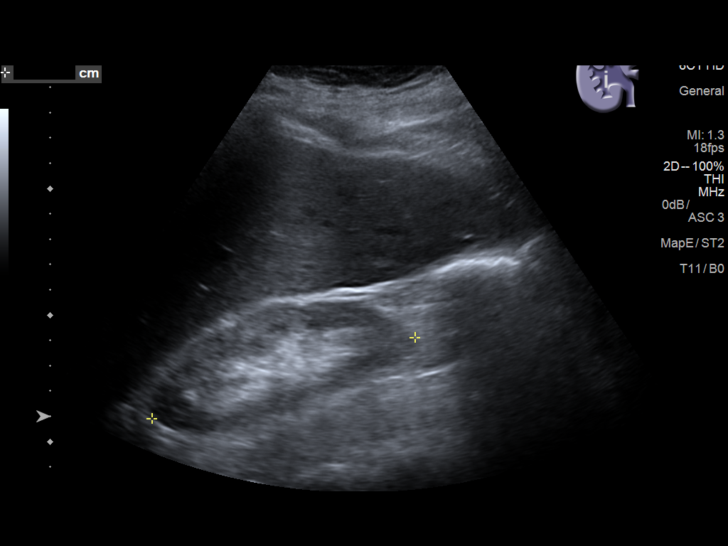
[im 3/32]
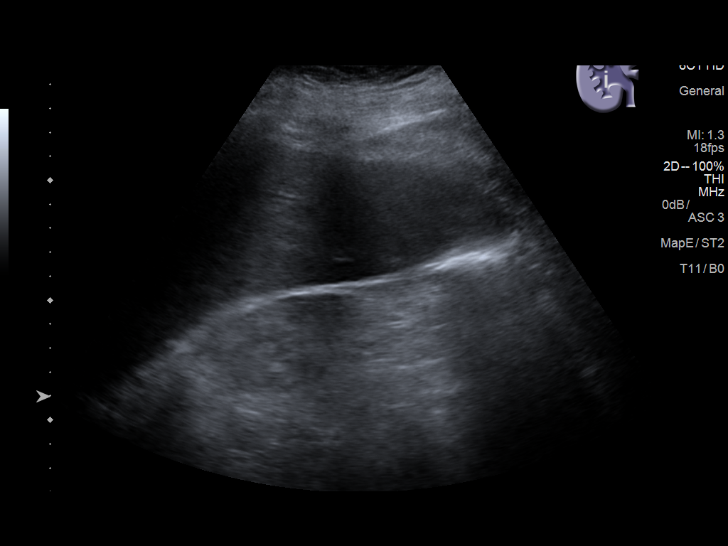
[im 6/32]
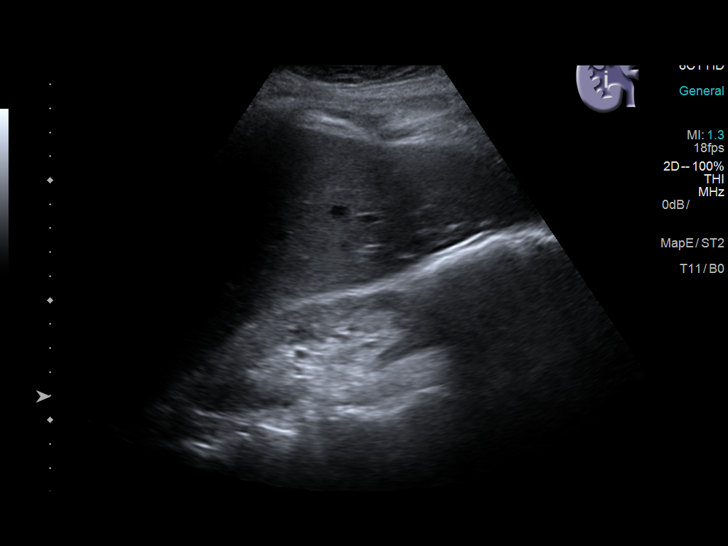
[im 8/32]
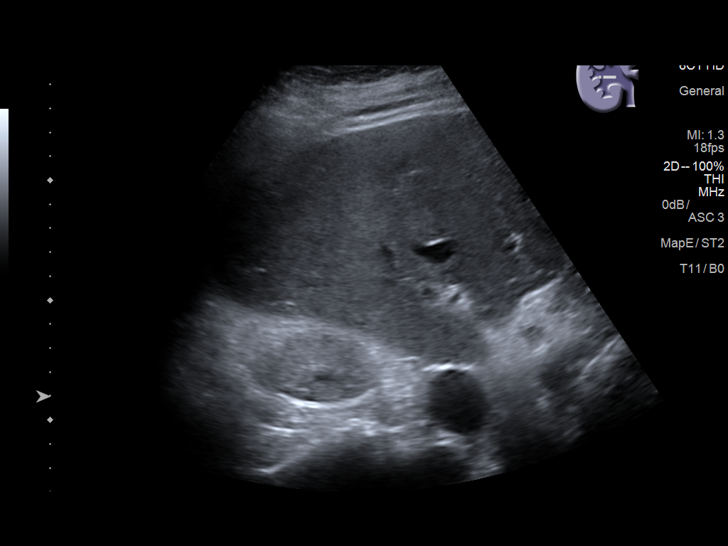
[im 11/32]
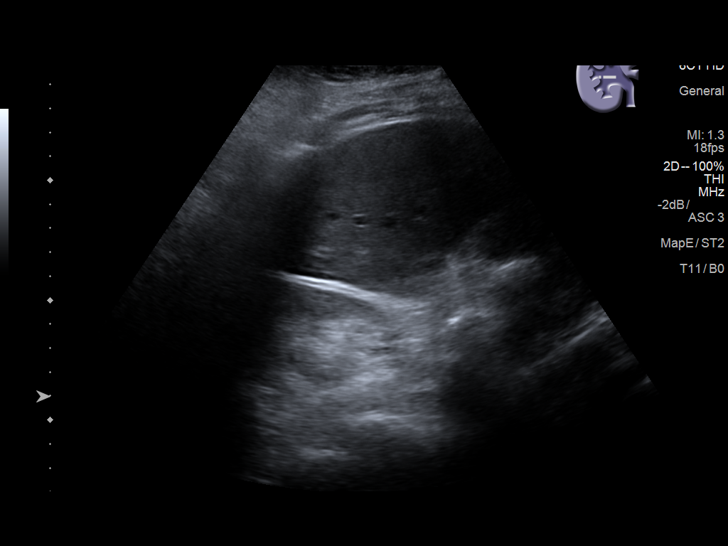
[im 12/32]
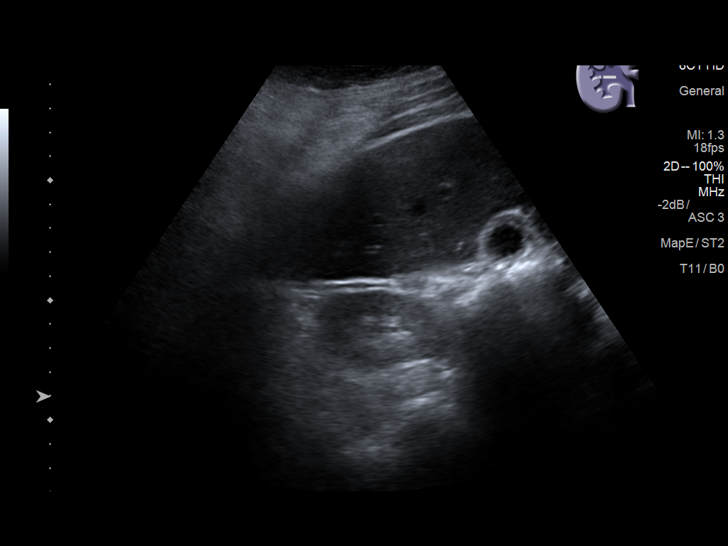
[im 15/32]
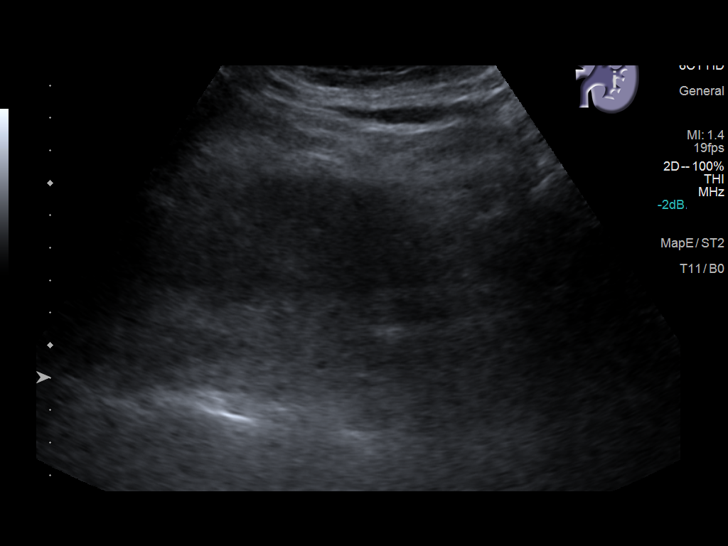
[im 17/32]
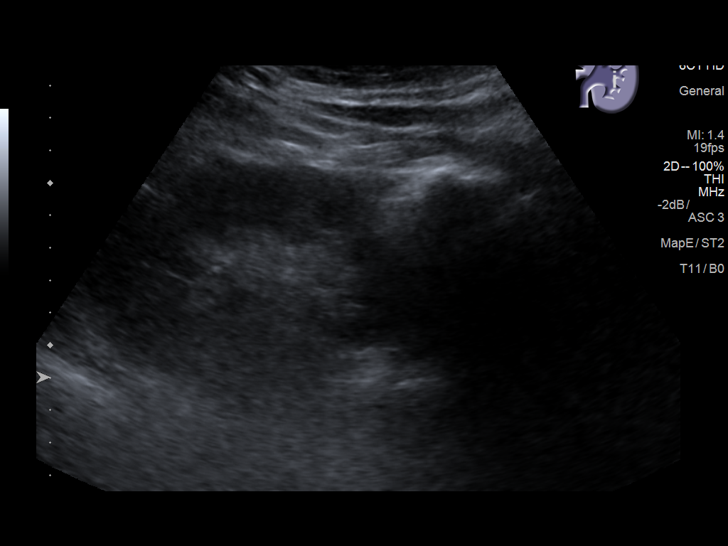
[im 20/32]
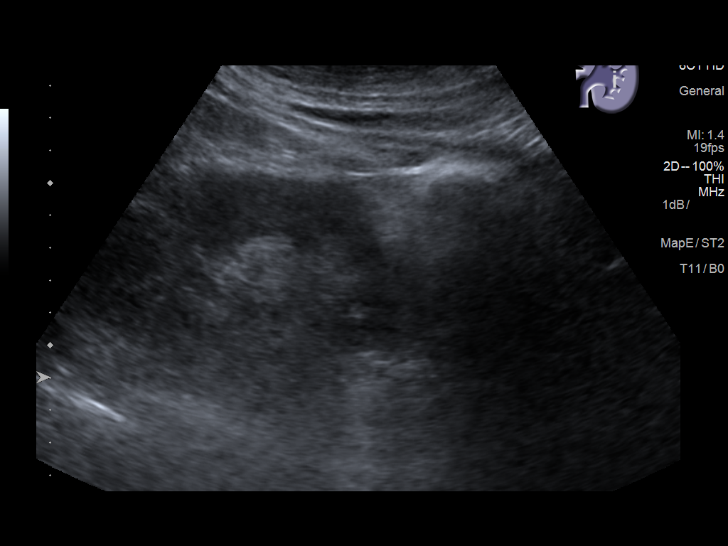
[im 21/32]
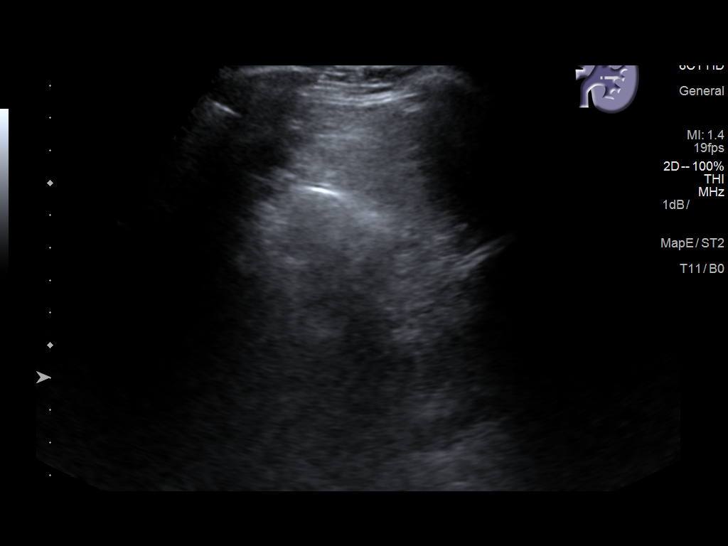
[im 24/32]
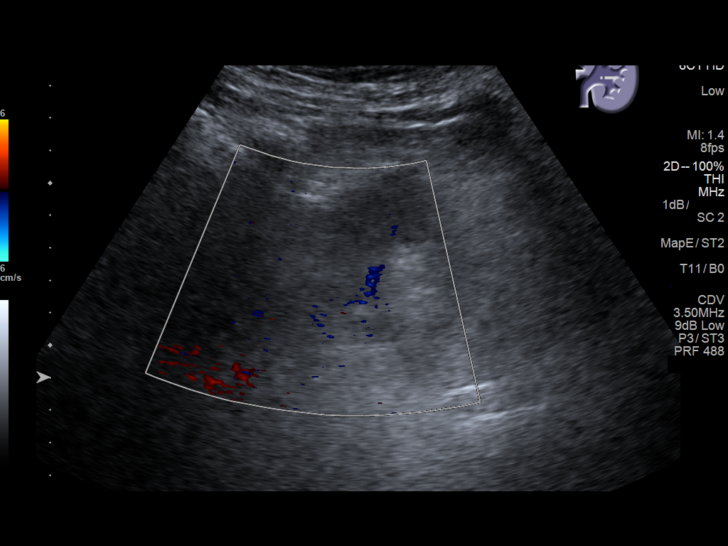
[im 26/32]
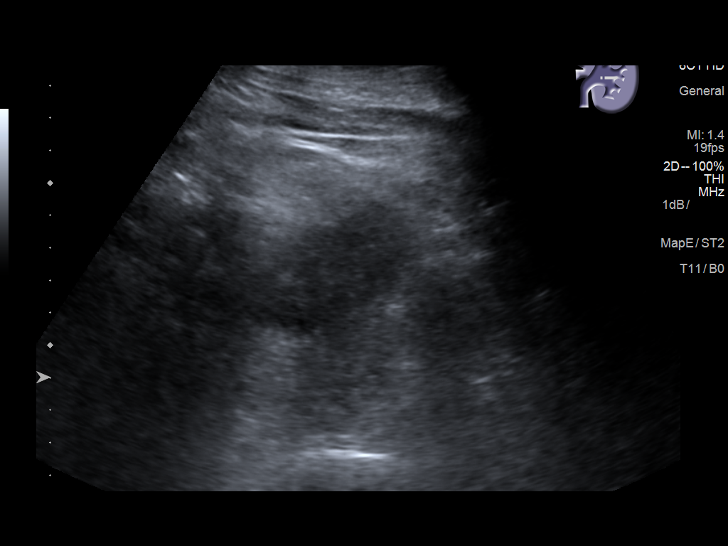
[im 29/32]
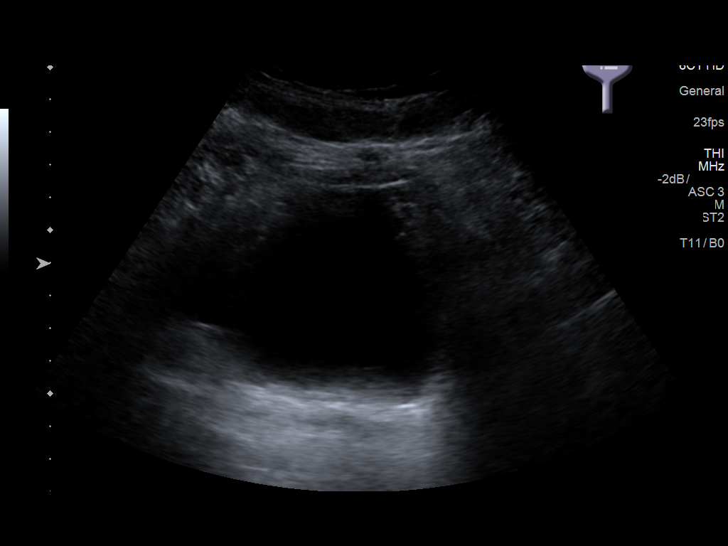
[im 32/32]
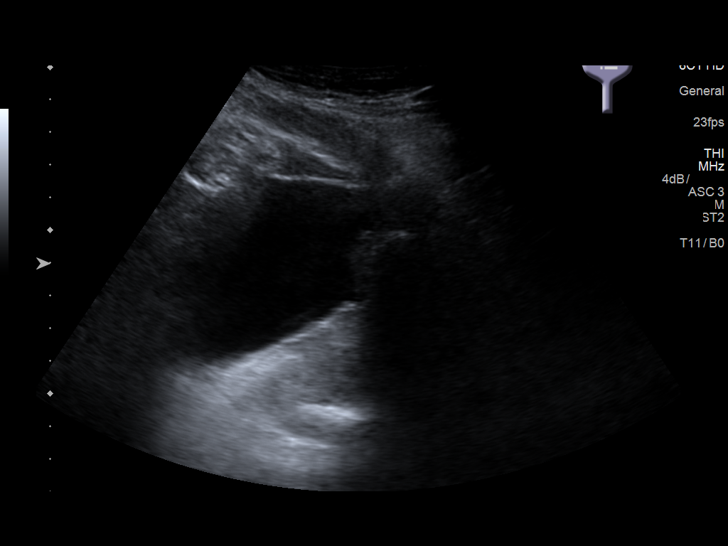

[14 of 25 positions shown; findings below may reference images not displayed]

FINDINGS: Right Kidney:

Length: 10.9 cm.. Echogenicity within normal limits. No mass or
hydronephrosis visualized.

Left Kidney:

Length: 10.7 cm.. Echogenicity within normal limits. No mass or
hydronephrosis visualized.

Bladder:

Decompressed.

Small right-sided pleural effusion is noted.
IMPRESSION: Kidneys are within normal limits.

Small right-sided pleural effusion.

## 2018-11-20 ENCOUNTER — Encounter (HOSPITAL_COMMUNITY): Payer: Self-pay | Admitting: Emergency Medicine

## 2018-11-20 ENCOUNTER — Observation Stay (HOSPITAL_COMMUNITY)
Admission: EM | Admit: 2018-11-20 | Discharge: 2018-11-21 | Disposition: A | Discharge status: Home / Self Care | Payer: BLUE CROSS/BLUE SHIELD | Attending: Internal Medicine | Admitting: Internal Medicine

## 2018-11-20 ENCOUNTER — Other Ambulatory Visit: Payer: Self-pay

## 2018-11-20 ENCOUNTER — Emergency Department (HOSPITAL_COMMUNITY): Payer: BLUE CROSS/BLUE SHIELD

## 2018-11-20 DIAGNOSIS — N186 End stage renal disease: Secondary | ICD-10-CM | POA: Insufficient documentation

## 2018-11-20 DIAGNOSIS — E1122 Type 2 diabetes mellitus with diabetic chronic kidney disease: Secondary | ICD-10-CM | POA: Insufficient documentation

## 2018-11-20 DIAGNOSIS — Z86718 Personal history of other venous thrombosis and embolism: Secondary | ICD-10-CM | POA: Diagnosis not present

## 2018-11-20 DIAGNOSIS — R001 Bradycardia, unspecified: Secondary | ICD-10-CM | POA: Diagnosis not present

## 2018-11-20 DIAGNOSIS — Z8249 Family history of ischemic heart disease and other diseases of the circulatory system: Secondary | ICD-10-CM | POA: Insufficient documentation

## 2018-11-20 DIAGNOSIS — E11610 Type 2 diabetes mellitus with diabetic neuropathic arthropathy: Secondary | ICD-10-CM | POA: Diagnosis present

## 2018-11-20 DIAGNOSIS — Z888 Allergy status to other drugs, medicaments and biological substances status: Secondary | ICD-10-CM | POA: Diagnosis not present

## 2018-11-20 DIAGNOSIS — Z992 Dependence on renal dialysis: Secondary | ICD-10-CM | POA: Diagnosis not present

## 2018-11-20 DIAGNOSIS — I10 Essential (primary) hypertension: Secondary | ICD-10-CM | POA: Diagnosis present

## 2018-11-20 DIAGNOSIS — R188 Other ascites: Secondary | ICD-10-CM | POA: Diagnosis not present

## 2018-11-20 DIAGNOSIS — Z79899 Other long term (current) drug therapy: Secondary | ICD-10-CM | POA: Insufficient documentation

## 2018-11-20 DIAGNOSIS — E1129 Type 2 diabetes mellitus with other diabetic kidney complication: Secondary | ICD-10-CM | POA: Diagnosis present

## 2018-11-20 DIAGNOSIS — K219 Gastro-esophageal reflux disease without esophagitis: Secondary | ICD-10-CM | POA: Insufficient documentation

## 2018-11-20 DIAGNOSIS — E781 Pure hyperglyceridemia: Secondary | ICD-10-CM | POA: Diagnosis present

## 2018-11-20 DIAGNOSIS — E875 Hyperkalemia: Principal | ICD-10-CM

## 2018-11-20 DIAGNOSIS — Z8619 Personal history of other infectious and parasitic diseases: Secondary | ICD-10-CM | POA: Diagnosis not present

## 2018-11-20 DIAGNOSIS — F419 Anxiety disorder, unspecified: Secondary | ICD-10-CM | POA: Insufficient documentation

## 2018-11-20 DIAGNOSIS — R531 Weakness: Secondary | ICD-10-CM | POA: Diagnosis present

## 2018-11-20 DIAGNOSIS — Z7982 Long term (current) use of aspirin: Secondary | ICD-10-CM | POA: Diagnosis not present

## 2018-11-20 DIAGNOSIS — I132 Hypertensive heart and chronic kidney disease with heart failure and with stage 5 chronic kidney disease, or end stage renal disease: Secondary | ICD-10-CM | POA: Diagnosis not present

## 2018-11-20 DIAGNOSIS — I5042 Chronic combined systolic (congestive) and diastolic (congestive) heart failure: Secondary | ICD-10-CM | POA: Diagnosis not present

## 2018-11-20 DIAGNOSIS — I5022 Chronic systolic (congestive) heart failure: Secondary | ICD-10-CM | POA: Diagnosis present

## 2018-11-20 LAB — COMPREHENSIVE METABOLIC PANEL
ALT: 16 U/L (ref 0–44)
AST: 15 U/L (ref 15–41)
Albumin: 3.7 g/dL (ref 3.5–5.0)
Alkaline Phosphatase: 83 U/L (ref 38–126)
Anion gap: 17 — ABNORMAL HIGH (ref 5–15)
BUN: 102 mg/dL — ABNORMAL HIGH (ref 6–20)
CO2: 19 mmol/L — ABNORMAL LOW (ref 22–32)
Calcium: 8.9 mg/dL (ref 8.9–10.3)
Chloride: 93 mmol/L — ABNORMAL LOW (ref 98–111)
Creatinine, Ser: 10.69 mg/dL — ABNORMAL HIGH (ref 0.61–1.24)
GFR calc Af Amer: 6 mL/min — ABNORMAL LOW (ref >60)
GFR calc non Af Amer: 5 mL/min — ABNORMAL LOW (ref >60)
Glucose, Bld: 232 mg/dL — ABNORMAL HIGH (ref 70–99)
Potassium: >7.5 mmol/L (ref 3.5–5.1)
Sodium: 129 mmol/L — ABNORMAL LOW (ref 135–145)
Total Bilirubin: 0.8 mg/dL (ref 0.3–1.2)
Total Protein: 7.5 g/dL (ref 6.5–8.1)

## 2018-11-20 LAB — CBC WITH DIFFERENTIAL/PLATELET
Abs Immature Granulocytes: 0.13 10*3/uL — ABNORMAL HIGH (ref 0.00–0.07)
Basophils Absolute: 0.1 10*3/uL (ref 0.0–0.1)
Basophils Relative: 1 %
Eosinophils Absolute: 0.2 10*3/uL (ref 0.0–0.5)
Eosinophils Relative: 2 %
HCT: 40.1 % (ref 39.0–52.0)
Hemoglobin: 12.4 g/dL — ABNORMAL LOW (ref 13.0–17.0)
Immature Granulocytes: 1 %
Lymphocytes Relative: 8 %
Lymphs Abs: 0.9 10*3/uL (ref 0.7–4.0)
MCH: 27.9 pg (ref 26.0–34.0)
MCHC: 30.9 g/dL (ref 30.0–36.0)
MCV: 90.3 fL (ref 80.0–100.0)
Monocytes Absolute: 1.1 10*3/uL — ABNORMAL HIGH (ref 0.1–1.0)
Monocytes Relative: 10 %
Neutro Abs: 8.4 10*3/uL — ABNORMAL HIGH (ref 1.7–7.7)
Neutrophils Relative %: 78 %
Platelets: 176 10*3/uL (ref 150–400)
RBC: 4.44 MIL/uL (ref 4.22–5.81)
RDW: 14.6 % (ref 11.5–15.5)
WBC: 10.7 10*3/uL — ABNORMAL HIGH (ref 4.0–10.5)
nRBC: 0 % (ref 0.0–0.2)

## 2018-11-20 LAB — GLUCOSE, CAPILLARY
Glucose-Capillary: 211 mg/dL — ABNORMAL HIGH (ref 70–99)
Glucose-Capillary: 178 mg/dL — ABNORMAL HIGH (ref 70–99)

## 2018-11-20 LAB — MRSA PCR SCREENING: MRSA by PCR: NEGATIVE

## 2018-11-20 LAB — TROPONIN I: Troponin I: <0.03 ng/mL (ref <0.03)

## 2018-11-20 MED ORDER — SODIUM CHLORIDE 0.9 % IV SOLN: 1.0000 g | Freq: Once | INTRAVENOUS | Status: DC

## 2018-11-20 MED ORDER — GABAPENTIN 400 MG PO CAPS: 400.0000 mg | ORAL_CAPSULE | ORAL | Status: DC

## 2018-11-20 MED ORDER — LIDOCAINE HCL (PF) 1 % IJ SOLN: 5.0000 mL | INTRAMUSCULAR | Status: DC | PRN

## 2018-11-20 MED ORDER — GABAPENTIN 100 MG PO CAPS
100.0000 mg | ORAL_CAPSULE | ORAL | Status: DC
  Administered 2018-11-20: 100 mg via ORAL
  Filled 2018-11-20: qty 1

## 2018-11-20 MED ORDER — ROPINIROLE HCL 0.25 MG PO TABS
0.2500 mg | ORAL_TABLET | Freq: Every day | ORAL | Status: DC
  Administered 2018-11-20: 0.5 mg via ORAL
  Filled 2018-11-20: qty 2

## 2018-11-20 MED ORDER — RENA-VITE PO TABS
1.0000 | ORAL_TABLET | Freq: Every day | ORAL | Status: DC
  Administered 2018-11-20: 1 via ORAL
  Filled 2018-11-20: qty 1

## 2018-11-20 MED ORDER — SODIUM CHLORIDE 0.9 % IV SOLN: 100.0000 mL | INTRAVENOUS | Status: DC | PRN

## 2018-11-20 MED ORDER — HYDROXYZINE HCL 25 MG PO TABS: 25.0000 mg | ORAL_TABLET | Freq: Two times a day (BID) | ORAL | Status: DC | PRN

## 2018-11-20 MED ORDER — CHLORHEXIDINE GLUCONATE CLOTH 2 % EX PADS: 6.0000 | MEDICATED_PAD | Freq: Every day | CUTANEOUS | Status: DC

## 2018-11-20 MED ORDER — LIDOCAINE-PRILOCAINE 2.5-2.5 % EX CREA
1.0000 "application " | TOPICAL_CREAM | CUTANEOUS | Status: DC | PRN
  Filled 2018-11-20: qty 5

## 2018-11-20 MED ORDER — GABAPENTIN 100 MG PO CAPS: 200.0000 mg | ORAL_CAPSULE | ORAL | Status: DC

## 2018-11-20 MED ORDER — ISOSORBIDE MONONITRATE ER 30 MG PO TB24
30.0000 mg | ORAL_TABLET | Freq: Every day | ORAL | Status: DC
  Filled 2018-11-20: qty 1

## 2018-11-20 MED ORDER — ALBUTEROL (5 MG/ML) CONTINUOUS INHALATION SOLN
10.0000 mg/h | INHALATION_SOLUTION | RESPIRATORY_TRACT | Status: DC
  Administered 2018-11-20: 10 mg/h via RESPIRATORY_TRACT
  Filled 2018-11-20: qty 20

## 2018-11-20 MED ORDER — INSULIN ASPART 100 UNIT/ML ~~LOC~~ SOLN: 0.0000 [IU] | Freq: Three times a day (TID) | SUBCUTANEOUS | Status: DC

## 2018-11-20 MED ORDER — AMLODIPINE BESYLATE 10 MG PO TABS
10.0000 mg | ORAL_TABLET | Freq: Every day | ORAL | Status: DC
  Administered 2018-11-20: 10 mg via ORAL
  Filled 2018-11-20: qty 1

## 2018-11-20 MED ORDER — HEPARIN SODIUM (PORCINE) 5000 UNIT/ML IJ SOLN
5000.0000 [IU] | Freq: Three times a day (TID) | INTRAMUSCULAR | Status: DC
  Filled 2018-11-20: qty 1

## 2018-11-20 MED ORDER — CALCIUM GLUCONATE-NACL 1-0.675 GM/50ML-% IV SOLN
1.0000 g | Freq: Once | INTRAVENOUS | Status: AC
  Administered 2018-11-20: 1000 mg via INTRAVENOUS

## 2018-11-20 MED ORDER — CARVEDILOL 25 MG PO TABS
25.0000 mg | ORAL_TABLET | Freq: Two times a day (BID) | ORAL | Status: DC
  Administered 2018-11-20: 25 mg via ORAL
  Filled 2018-11-20: qty 1

## 2018-11-20 MED ORDER — POLYETHYLENE GLYCOL 3350 17 G PO PACK: 17.0000 g | PACK | Freq: Every day | ORAL | Status: DC | PRN

## 2018-11-20 MED ORDER — ZOLPIDEM TARTRATE 5 MG PO TABS: 10.0000 mg | ORAL_TABLET | Freq: Every evening | ORAL | Status: DC | PRN

## 2018-11-20 MED ORDER — DEXTROSE 50 % IV SOLN
1.0000 | Freq: Once | INTRAVENOUS | Status: AC
  Administered 2018-11-20: 50 mL via INTRAVENOUS

## 2018-11-20 MED ORDER — OXYCODONE-ACETAMINOPHEN 10-325 MG PO TABS: 1.0000 | ORAL_TABLET | ORAL | Status: DC | PRN

## 2018-11-20 MED ORDER — OXYCODONE HCL 5 MG PO TABS
5.0000 mg | ORAL_TABLET | ORAL | Status: DC | PRN
  Administered 2018-11-20 – 2018-11-21 (×4): 5 mg via ORAL
  Filled 2018-11-20 (×3): qty 1

## 2018-11-20 MED ORDER — LORAZEPAM 2 MG/ML IJ SOLN
0.5000 mg | Freq: Once | INTRAMUSCULAR | Status: DC
  Filled 2018-11-20: qty 1

## 2018-11-20 MED ORDER — PENTAFLUOROPROP-TETRAFLUOROETH EX AERO
1.0000 "application " | INHALATION_SPRAY | CUTANEOUS | Status: DC | PRN
  Filled 2018-11-20: qty 116

## 2018-11-20 MED ORDER — TAMSULOSIN HCL 0.4 MG PO CAPS
0.8000 mg | ORAL_CAPSULE | Freq: Every day | ORAL | Status: DC
  Administered 2018-11-20: 0.8 mg via ORAL
  Filled 2018-11-20: qty 2

## 2018-11-20 MED ORDER — PANTOPRAZOLE SODIUM 40 MG PO TBEC
80.0000 mg | DELAYED_RELEASE_TABLET | Freq: Every day | ORAL | Status: DC
  Administered 2018-11-20: 80 mg via ORAL
  Filled 2018-11-20: qty 2

## 2018-11-20 MED ORDER — GABAPENTIN 100 MG PO CAPS
200.0000 mg | ORAL_CAPSULE | ORAL | Status: DC
  Administered 2018-11-21: 200 mg via ORAL
  Filled 2018-11-20: qty 2

## 2018-11-20 MED ORDER — FUROSEMIDE 80 MG PO TABS
80.0000 mg | ORAL_TABLET | Freq: Two times a day (BID) | ORAL | Status: DC
  Administered 2018-11-20: 80 mg via ORAL
  Filled 2018-11-20: qty 1

## 2018-11-20 MED ORDER — ALBUTEROL SULFATE (2.5 MG/3ML) 0.083% IN NEBU: 10.0000 mg | INHALATION_SOLUTION | Freq: Once | RESPIRATORY_TRACT | Status: DC

## 2018-11-20 MED ORDER — ATROPINE SULFATE 1 MG/ML IJ SOLN
INTRAMUSCULAR | Status: AC
  Filled 2018-11-20: qty 1

## 2018-11-20 MED ORDER — CALCIUM GLUCONATE 10 % IV SOLN
INTRAVENOUS | Status: AC
  Filled 2018-11-20: qty 10

## 2018-11-20 MED ORDER — CALCIUM GLUCONATE-NACL 1-0.675 GM/50ML-% IV SOLN: 1.0000 g | Freq: Once | INTRAVENOUS | Status: DC

## 2018-11-20 MED ORDER — TRAZODONE HCL 50 MG PO TABS
50.0000 mg | ORAL_TABLET | Freq: Every day | ORAL | Status: DC
  Administered 2018-11-20: 50 mg via ORAL
  Filled 2018-11-20: qty 1

## 2018-11-20 MED ORDER — HYDRALAZINE HCL 25 MG PO TABS
25.0000 mg | ORAL_TABLET | Freq: Three times a day (TID) | ORAL | Status: DC
  Administered 2018-11-20 – 2018-11-21 (×2): 25 mg via ORAL
  Filled 2018-11-20 (×2): qty 1

## 2018-11-20 MED ORDER — INSULIN ASPART 100 UNIT/ML IV SOLN
5.0000 [IU] | Freq: Once | INTRAVENOUS | Status: AC
  Administered 2018-11-20: 5 [IU] via INTRAVENOUS

## 2018-11-20 MED ORDER — ALTEPLASE 2 MG IJ SOLR
2.0000 mg | Freq: Once | INTRAMUSCULAR | Status: DC | PRN
  Filled 2018-11-20: qty 2

## 2018-11-20 MED ORDER — GABAPENTIN 100 MG PO CAPS: 200.0000 mg | ORAL_CAPSULE | Freq: Every day | ORAL | Status: DC | PRN

## 2018-11-20 MED ORDER — DEXTROSE 50 % IV SOLN
INTRAVENOUS | Status: AC
  Filled 2018-11-20: qty 50

## 2018-11-20 MED ORDER — FERRIC CITRATE 1 GM 210 MG(FE) PO TABS
420.0000 mg | ORAL_TABLET | Freq: Three times a day (TID) | ORAL | Status: DC
  Administered 2018-11-21: 420 mg via ORAL
  Filled 2018-11-20: qty 2

## 2018-11-20 MED ORDER — LIDOCAINE-PRILOCAINE 2.5-2.5 % EX CREA
1.0000 "application " | TOPICAL_CREAM | CUTANEOUS | Status: DC
  Administered 2018-11-21: 1 via TOPICAL
  Filled 2018-11-20: qty 5

## 2018-11-20 MED ORDER — CALCIUM GLUCONATE 10 % IV SOLN
1.0000 g | Freq: Once | INTRAVENOUS | Status: AC
  Administered 2018-11-20: 1 g via INTRAVENOUS

## 2018-11-20 MED ORDER — SODIUM BICARBONATE 8.4 % IV SOLN
50.0000 meq | Freq: Once | INTRAVENOUS | Status: AC
  Administered 2018-11-20: 50 meq via INTRAVENOUS
  Filled 2018-11-20: qty 50

## 2018-11-20 MED ORDER — OXYCODONE-ACETAMINOPHEN 5-325 MG PO TABS
1.0000 | ORAL_TABLET | ORAL | Status: DC | PRN
  Administered 2018-11-20 – 2018-11-21 (×3): 1 via ORAL
  Filled 2018-11-20 (×2): qty 1

## 2018-11-20 MED ORDER — POLYETHYLENE GLYCOL 3350 17 GM/SCOOP PO POWD
17.0000 g | Freq: Every day | ORAL | Status: DC | PRN
  Filled 2018-11-20: qty 255

## 2018-11-20 MED ORDER — ASPIRIN 81 MG PO CHEW
81.0000 mg | CHEWABLE_TABLET | Freq: Every day | ORAL | Status: DC
  Administered 2018-11-20: 81 mg via ORAL
  Filled 2018-11-20: qty 1

## 2018-11-20 MED ORDER — ONDANSETRON HCL 4 MG/2ML IJ SOLN: 4.0000 mg | Freq: Once | INTRAMUSCULAR | Status: DC

## 2018-11-20 MED ORDER — HEPARIN SODIUM (PORCINE) 1000 UNIT/ML DIALYSIS
1000.0000 [IU] | INTRAMUSCULAR | Status: DC | PRN
  Filled 2018-11-20: qty 1

## 2018-11-20 MED ORDER — DOCUSATE SODIUM 100 MG PO CAPS
100.0000 mg | ORAL_CAPSULE | Freq: Every day | ORAL | Status: DC
  Filled 2018-11-20: qty 1

## 2018-11-20 MED ORDER — CALCIUM GLUCONATE-NACL 1-0.675 GM/50ML-% IV SOLN
INTRAVENOUS | Status: AC
  Filled 2018-11-20: qty 50

## 2018-11-20 MED ORDER — ALBUTEROL (5 MG/ML) CONTINUOUS INHALATION SOLN: 5.0000 mg/h | INHALATION_SOLUTION | Freq: Once | RESPIRATORY_TRACT | Status: DC

## 2018-11-20 NOTE — Procedures (Signed)
   I was present at this dialysis session, have reviewed the session itself and made  appropriate changes Kelly Splinter MD [place] pager [phone]   11/20/2018, 12:10 PM  '

## 2018-11-20 NOTE — ED Triage Notes (Signed)
Pt here from home with c/o "cant walk", pt was able to get up out of w/c and sit in the bed, pt is on dialysis and is due today

## 2018-11-20 NOTE — ED Provider Notes (Signed)
[facility] EMERGENCY DEPARTMENT Provider Note   CSN: [id number] Arrival date & time: 11/20/18  0749    History   Chief Complaint Chief Complaint  Patient presents with  . Weakness    HPI James Burke is a 53 y.o. male.     Patient is a 53 y/o male with extensive comorbidity including ESRD on dialysis, cardiomyopathy, diabetes, pulmonary HTN presents to the ED for complaints of weakness. Patient has had transient episodes of global weakness since yesterday afternoon x3. Patient reports he was in his usual state of health and driving home yesterday when he began to feel generalized weakness in both arms and legs with lightheadedness and needing assistance to get out of his vehicle and walk into the house. This lasted for about 30 minutes and resolved on its own, followed by another episode. During the second episode the patient reports his wife took his blood pressure which was 90/51. His blood sugar was normal at that time. This also resolved on its own. Patient had a third occurrence this morning on the way to the ED. Reports feeling so weak he needed a wheelchair to enter the building. Currently feeling mostly at his baseline but still a little weak. Denies n/v/d, chest pain, SOB, cough. He missed his dialysis treatment yesterday and was rescheduled for this morning.      Past Medical History:  Diagnosis Date  . Anemia   . Arthritis, septic, knee ([place])   . C. difficile colitis 04/18/2008  . Congestive heart failure ([place])   . Diabetes mellitus    Type II  . DVT (deep venous thrombosis) ([facility])    right leg   . Dyspnea    "when I have too much fluid.'  . ESRD (end stage renal disease) ([place])    Tues, Thurs  . GERD (gastroesophageal reflux disease)   . History of blood transfusion 2017  . Pneumonia     Patient Active Problem List   Diagnosis Date Noted  . History of Clostridium difficile colitis 08/23/2018  . Anxiety 07/31/2017  . Encounter for chronic  pain management 06/15/2017  . Pericardial effusion without cardiac tamponade   . ESRD (end stage renal disease) on dialysis ([place])   . Hypertensive heart and chronic kidney disease with heart failure and stage 1 through stage 4 chronic kidney disease, or chronic kidney disease ([place])   . Acute on chronic combined systolic and diastolic congestive heart failure ([place]) 12/30/2016  . BPH (benign prostatic hyperplasia) 12/30/2016  . Charcot foot due to diabetes mellitus ([place]) 10/23/2015  . Nonischemic cardiomyopathy ([place]) 09/17/2014  . Insomnia 07/16/2014  . Pericardial effusion 06/25/2014  . Bilateral pleural effusion 06/10/2014  . Constipation 04/20/2014  . Chronic combined systolic and diastolic heart failure ([place]) 04/17/2014  . Type 2 diabetes mellitus with diabetic chronic kidney disease ([place]) 04/17/2014  . Leukocytosis 04/17/2014  . Microcytic anemia 04/17/2014  . Mild pulmonary hypertension ([place]) 04/17/2014  . Vitiligo 03/13/2014  . Diabetic retinopathy associated with type 2 diabetes mellitus ([place]) 01/12/2012  . Hypertriglyceridemia 01/12/2012  . Essential hypertension, benign 01/12/2012  . Elevated LFTs 01/12/2012  . Controlled type 2 diabetes with renal manifestation ([place]) 01/03/2012  . Chronic systolic CHF (congestive heart failure), NYHA class 1 ([place]) 01/03/2012    Past Surgical History:  Procedure Laterality Date  . APPENDECTOMY  1995  . BASCILIC VEIN TRANSPOSITION Left 01/03/2017   Procedure: FIRST STAGE BRACHIAL VEIN TRANSPOSITION;  Surgeon: Conrad Dry Run, MD;  Location: [facility];  Service: Vascular;  Laterality: Left;  . BASCILIC VEIN TRANSPOSITION Left 04/24/2017   Procedure: LEFT SECOND STAGE BRACHIAL VEIN TRANSPOSITION;  Surgeon: Conrad Nedrow, MD;  Location: [facility];  Service: Vascular;  Laterality: Left;  . EYE SURGERY    . INSERTION OF DIALYSIS CATHETER Right 01/03/2017   Procedure: INSERTION OF DIALYSIS CATHETER RIGHT INTERNAL JUGULAR;  Surgeon: Conrad , MD;  Location: [facility];  Service: Vascular;  Laterality: Right;  . IR PARACENTESIS  12/14/2017  . IR PARACENTESIS  02/01/2018  . IR PARACENTESIS  03/28/2018  . IR PARACENTESIS  05/17/2018  . IR PARACENTESIS  05/31/2018  . IR PARACENTESIS  06/12/2018  . IR PARACENTESIS  06/26/2018  . IR PARACENTESIS  07/10/2018  . IR PARACENTESIS  07/30/2018  . IR PARACENTESIS  08/30/2018  . IR PARACENTESIS  09/13/2018  . IR PARACENTESIS  09/27/2018  . IR PARACENTESIS  10/11/2018  . IR PARACENTESIS  10/30/2018  . KNEE SURGERY Left 04/2014  . [place]  Bilateral   . PERICARDIOCENTESIS N/A 01/03/2017   Procedure: Pericardiocentesis;  Surgeon: Burnell Blanks, MD;  Location: [facility];  Service: Cardiovascular;  Laterality: N/A;        Home Medications    Prior to Admission medications   Medication Sig Start Date End Date Taking? Authorizing Provider  amLODipine (NORVASC) 10 MG tablet TAKE 1 TABLET BY MOUTH EVERY DAY Patient taking differently: Take 10 mg by mouth daily.  07/23/18  Yes [name], MD  aspirin 81 MG tablet Take 81 mg by mouth at bedtime.    Yes [provider]  AURYXIA 1 GM 210 MG(Fe) tablet Take 420 mg by mouth 3 (three) times daily with meals.  03/04/18  Yes [provider]  b complex-vitamin c-folic acid (NEPHRO-VITE) 0.8 MG TABS tablet Take 1 tablet by mouth daily. 03/12/18  Yes [provider]  carvedilol (COREG) 25 MG tablet TAKE 1 TABLET TWICE A DAY WITH FOOD Patient taking differently: Take 25 mg by mouth 2 (two) times daily with a meal.  10/15/18  Yes [name], MD  docusate sodium (COLACE) 100 MG capsule Take 1 capsule (100 mg total) by mouth at bedtime. 04/24/17  Yes Alvia Grove, PA-C  furosemide (LASIX) 80 MG tablet Take 1 tablet (80 mg total) by mouth 2 (two) times daily. 09/12/18  Yes [name], MD  gabapentin (NEURONTIN) 100 MG capsule Take 2 capsules (200 mg total) by mouth daily as needed (Give one hour prior to hemodialysis  session as needed for anxiety/restlessness). Patient taking differently: Take 100-200 mg by mouth See admin instructions. Take 1 capsule by mouth twice daily on Tues, Thurs, Sat, Sunday. Then take 2 capsules every morning and evening on Mon, Wed, and Fridays. 08/02/17  Yes Hall, Carole N, DO  hydrALAZINE (APRESOLINE) 25 MG tablet TAKE 1 TABLET (25 MG TOTAL) 3 (THREE) TIMES DAILY BY MOUTH. 02/13/18  Yes [name], MD  hydrOXYzine (ATARAX/VISTARIL) 25 MG tablet TAKE 1-2 TABLETS (25-50 MG TOTAL) BY MOUTH DAILY AS NEEDED. Patient taking differently: Take 25 mg by mouth 2 (two) times daily as needed for anxiety.  05/09/18  Yes [name], MD  isosorbide mononitrate (IMDUR) 30 MG 24 hr tablet TAKE 1 TABLET BY MOUTH EVERY DAY Patient taking differently: Take 30 mg by mouth daily.  03/19/18  Yes Crenshaw, Denice Bors, MD  lidocaine-prilocaine (EMLA) cream APPLY THREE TIMES A WEEK AS DIRECTED 1 HOUR PRIOR TO DIALYSIS AND WRAP WITH  PLASTIC WRAP Patient taking differently: Apply 1 application topically See admin instructions. APPLY THREE TIMES A WEEK AS DIRECTED 1 HOUR PRIOR TO DIALYSIS AND WRAP WITH PLASTIC WRAP 11/13/18  Yes [name], MD  omeprazole (PRILOSEC) 40 MG capsule TAKE 1 CAPSULE BY MOUTH EVERY DAY Patient taking differently: Take 40 mg by mouth daily.  10/15/18  Yes [name], MD  oxyCODONE-acetaminophen (PERCOCET) 10-325 MG tablet Take 1 tablet by mouth every 4 (four) hours as needed for pain. 10/24/18  Yes [name], MD  polyethylene glycol powder (MIRALAX) powder Take 17 g by mouth daily as needed for moderate constipation. 04/24/17  Yes Alvia Grove, PA-C  rOPINIRole (REQUIP) 0.25 MG tablet Take 1-2 tablets (0.25-0.5 mg total) by mouth at bedtime. Patient taking differently: Take 0.25-0.5 mg by mouth at bedtime.  07/31/18  Yes [name], MD  tamsulosin (FLOMAX) 0.4 MG CAPS capsule TAKE 2 CAPSULES BY MOUTH DAILY AFTER SUPPER Patient  taking differently: Take 0.8 mg by mouth daily after supper.  03/23/18  Yes [name], MD  traZODone (DESYREL) 50 MG tablet TAKE 1 TABLET AT BEDTIME Patient taking differently: Take 50 mg by mouth at bedtime.  09/06/18  Yes [name], MD  TRULICITY 1.51 VO/1.6WV SOPN INJECT 0.75MG  EVERY 7 DAYS Patient taking differently: Inject 0.75 mg into the skin every 7 (seven) days.  01/15/18  Yes [name], MD  zolpidem (AMBIEN) 10 MG tablet TAKE 1 TABLET AT BEDTIME Patient taking differently: Take 10 mg by mouth at bedtime as needed for sleep.  10/03/17  Yes [name], MD    Family History Family History  Problem Relation Age of Onset  . Heart disease Other        No family history  . Cancer Brother     Social History Social History   Tobacco Use  . Smoking status: Never Smoker  . Smokeless tobacco: Former Systems developer    Types: Chew  . Tobacco comment: no chewing 01/2017  Substance Use Topics  . Alcohol use: No    Alcohol/week: 0.0 standard drinks  . Drug use: No     Allergies   Prednisone; Simvastatin; Lorazepam; Methocarbamol; and Seroquel [quetiapine fumarate]   Review of Systems Review of Systems  Constitutional: Positive for fatigue. Negative for activity change.  HENT: Negative for ear discharge and facial swelling.   Eyes: Negative for visual disturbance.  Respiratory: Negative for cough, chest tightness and shortness of breath.   Cardiovascular: Negative for chest pain, palpitations and leg swelling.  Gastrointestinal: Negative for abdominal pain, diarrhea, nausea and vomiting.  Genitourinary: Negative for dysuria and hematuria.  Musculoskeletal: Negative for back pain.  Neurological: Positive for weakness and light-headedness. Negative for dizziness, tremors, seizures, syncope, facial asymmetry, numbness and headaches.     Physical Exam Updated Vital Signs BP (!) 150/84 (BP Location: Right Arm)   Pulse 89   Temp 97.9 F (36.6 C)  (Oral)   Resp 14   SpO2 98%   Physical Exam Vitals signs and nursing note reviewed.  Constitutional:      General: He is not in acute distress.    Appearance: Normal appearance. He is obese. He is not ill-appearing, toxic-appearing or diaphoretic.  HENT:     Head: Normocephalic and atraumatic.     Mouth/Throat:     Mouth: Mucous membranes are moist.     Pharynx: Oropharynx is clear. No oropharyngeal exudate or posterior oropharyngeal erythema.  Eyes:  Conjunctiva/sclera: Conjunctivae normal.  Cardiovascular:     Rate and Rhythm: Regular rhythm. Bradycardia present.     Heart sounds: No murmur.  Pulmonary:     Effort: Pulmonary effort is normal.  Abdominal:     General: Abdomen is flat. Bowel sounds are normal.  Skin:    General: Skin is dry.     Capillary Refill: Capillary refill takes less than 2 seconds.  Neurological:     Mental Status: He is alert.  Psychiatric:        Mood and Affect: Mood normal.      ED Treatments / Results  Labs (all labs ordered are listed, but only abnormal results are displayed) Labs Reviewed  COMPREHENSIVE METABOLIC PANEL - Abnormal; Notable for the following components:      Result Value   Sodium 129 (*)    Potassium >7.5 (*)    Chloride 93 (*)    CO2 19 (*)    Glucose, Bld 232 (*)    BUN 102 (*)    Creatinine, Ser 10.69 (*)    GFR calc non Af Amer 5 (*)    GFR calc Af Amer 6 (*)    Anion gap 17 (*)    All other components within normal limits  CBC WITH DIFFERENTIAL/PLATELET - Abnormal; Notable for the following components:   WBC 10.7 (*)    Hemoglobin 12.4 (*)    Neutro Abs 8.4 (*)    Monocytes Absolute 1.1 (*)    Abs Immature Granulocytes 0.13 (*)    All other components within normal limits  TROPONIN I  URINALYSIS, ROUTINE W REFLEX MICROSCOPIC    EKG EKG Interpretation  Date/Time:  Tuesday November 20 2018 07:57:28 EDT Ventricular Rate:  53 PR Interval:    QRS Duration: 162 QT Interval:  493 QTC Calculation: 463 R  Axis:   -60 Text Interpretation:  Junctional rhythm Left bundle branch block Confirmed by Gerlene Fee [phone]) on 11/20/2018 8:00:59 AM   Radiology Dg Chest 2 View  Result Date: 11/20/2018 CLINICAL DATA:  Bradycardia EXAM: CHEST - 2 VIEW COMPARISON:  July 25, 2017 FINDINGS: There is mild scarring in the left base. There is no edema or consolidation. Heart is mildly enlarged with pulmonary vascularity normal. No adenopathy. No pneumothorax. No bone lesions. IMPRESSION: Stable scarring left base. No edema or consolidation. Stable cardiac prominence. Electronically Signed   By: Lowella Grip III M.D.   On: 11/20/2018 08:42    Procedures Procedures (including critical care time)  Medications Ordered in ED Medications  LORazepam (ATIVAN) injection 0.5 mg (0 mg Intravenous Hold 11/20/18 0842)  dextrose 50 % solution (has no administration in time range)  calcium gluconate in NaCl 1-0.675 GM/50ML-% IVPB (has no administration in time range)  albuterol (PROVENTIL,VENTOLIN) solution continuous neb (10 mg/hr Nebulization Transfusing/Transfer 11/20/18 1046)  Chlorhexidine Gluconate Cloth 2 % PADS 6 each (has no administration in time range)  pentafluoroprop-tetrafluoroeth (GEBAUERS) aerosol 1 application (has no administration in time range)  lidocaine (PF) (XYLOCAINE) 1 % injection 5 mL (has no administration in time range)  lidocaine-prilocaine (EMLA) cream 1 application (has no administration in time range)  0.9 %  sodium chloride infusion (has no administration in time range)  0.9 %  sodium chloride infusion (has no administration in time range)  heparin injection 1,000 Units (has no administration in time range)  alteplase (CATHFLO ACTIVASE) injection 2 mg (has no administration in time range)  Chlorhexidine Gluconate Cloth 2 % PADS 6 each (has no administration in time  range)  calcium gluconate inj 10% (1 g) URGENT USE ONLY! (1 g Intravenous Given 11/20/18 0839)  dextrose 50 % solution  50 mL (50 mLs Intravenous Given 11/20/18 0840)  insulin aspart (novoLOG) injection 5 Units (5 Units Intravenous Given 11/20/18 0841)  sodium bicarbonate injection 50 mEq (50 mEq Intravenous Given 11/20/18 1020)  calcium gluconate 1 g/ 50 mL sodium chloride IVPB (0 g Intravenous Stopped 11/20/18 1100)     Initial Impression / Assessment and Plan / ED Course  I have reviewed the triage vital signs and the nursing notes.  Pertinent labs & imaging results that were available during my care of the patient were reviewed by me and considered in my medical decision making (see chart for details).  Clinical Course as of Nov 20 1555  Tue Nov 20, 2018  0848 Dialysis patient coming to ED for presyncope. Missed dialysis yesterday. Noted to have bradycardia and EKG appearing more widened than previous with prolonged QT. Hyperkalemia vs symptomatic bradycardia. Dr. Sedonia Small in to see the patient and ordered for hyperkalemia treatment initiated including insulin and calcium. Labs pending.    [KM]  1025 Patient remains bradycardic and feeling weak after calcium and insulin.  Potassium noted to be greater than 7.5.  Heart rates in the 40s.  Patient is normotensive and alert and oriented.  I have ordered continuous albuterol and a consult with nephrology is pending for emergent dialysis   [KM]  1033 Spoke with Dr. Jonnie Finner Ne[facility]rology who will get patient in for emergent dialysis.   [KM]    Clinical Course User Index [KM] Alveria Apley, PA-C       CRITICAL CARE Performed by: Alveria Apley   Total critical care time:45 minutes  Critical care time was exclusive of separately billable procedures and treating other patients.  Critical care was necessary to treat or prevent imminent or life-threatening deterioration.  Critical care was time spent personally by me on the following activities: development of treatment plan with patient and/or surrogate as well as nursing, discussions with consultants, evaluation  of patient's response to treatment, examination of patient, obtaining history from patient or surrogate, ordering and performing treatments and interventions, ordering and review of laboratory studies, ordering and review of radiographic studies, pulse oximetry and re-evaluation of patient's condition.   Final Clinical Impressions(s) / ED Diagnoses   Final diagnoses:  Hyperkalemia  ESRD (end stage renal disease) [facility])  Weakness    ED Discharge Orders    None       Kristine Royal 11/20/18 1557    Maudie Flakes, MD 11/20/18 [phone]

## 2018-11-20 NOTE — H&P (Signed)
History and Physical    NEKO BOYAJIAN JTT:[id number] DOB: [birth year] DOA: 11/20/2018  PCP: [name], MD  Patient coming from: Home  I have personally briefly reviewed patient's old medical records in [place]  Chief Complaint: Weak with syncope  HPI: James Burke is a 53 y.o. male with medical history significant of stage renal disease on hemodialysis, diabetes type 2, history of C. difficile colitis, deep venous thrombosis, gastroesophageal reflux disease, and pneumonia.  Hemodialysis was started in May 2018.  Patient missed his hemodialysis yesterday on Tuesday and came to the emergency department this morning for diffuse muscle weakness and inability to walk.  He was found to be hyperkalemic with a potassium of greater than 7.5 and was having junctional bradycardia with QRS widening on EKG.  Was seen by nephrology and taking emergently to dialysis.  He needs Neurontin prior to hemodialysis and lidocaine cream due to discomfort that he has with it.  Denies any shortness of breath, cough, chest pain, abdominal pain, no nausea, vomiting, diarrhea, no fever, no chills, no focal weakness or speech issues.  ED Course: Noted to be hyperkalemic and bradycardic referred urgently for dialysis  Review of Systems: As per HPI otherwise all other systems reviewed and  negative.   Past Medical History:  Diagnosis Date  . Anemia   . Arthritis, septic, knee ([place])   . C. difficile colitis 04/18/2008  . Congestive heart failure ([place])   . Diabetes mellitus    Type II  . DVT (deep venous thrombosis) (HCC)    right leg   . Dyspnea    "when I have too much fluid.'  . ESRD (end stage renal disease) ([place])    Tues, Thurs  . GERD (gastroesophageal reflux disease)   . History of blood transfusion 2017  . Pneumonia     Past Surgical History:  Procedure Laterality Date  . APPENDECTOMY  1995  . BASCILIC VEIN TRANSPOSITION Left 01/03/2017   Procedure: FIRST STAGE BRACHIAL VEIN  TRANSPOSITION;  Surgeon: Conrad Thynedale, MD;  Location: [facility];  Service: Vascular;  Laterality: Left;  . BASCILIC VEIN TRANSPOSITION Left 04/24/2017   Procedure: LEFT SECOND STAGE BRACHIAL VEIN TRANSPOSITION;  Surgeon: Conrad Las Lomas, MD;  Location: [facility];  Service: Vascular;  Laterality: Left;  . EYE SURGERY    . INSERTION OF DIALYSIS CATHETER Right 01/03/2017   Procedure: INSERTION OF DIALYSIS CATHETER RIGHT INTERNAL JUGULAR;  Surgeon: Conrad Bucksport, MD;  Location: [facility];  Service: Vascular;  Laterality: Right;  . IR PARACENTESIS  12/14/2017  . IR PARACENTESIS  02/01/2018  . IR PARACENTESIS  03/28/2018  . IR PARACENTESIS  05/17/2018  . IR PARACENTESIS  05/31/2018  . IR PARACENTESIS  06/12/2018  . IR PARACENTESIS  06/26/2018  . IR PARACENTESIS  07/10/2018  . IR PARACENTESIS  07/30/2018  . IR PARACENTESIS  08/30/2018  . IR PARACENTESIS  09/13/2018  . IR PARACENTESIS  09/27/2018  . IR PARACENTESIS  10/11/2018  . IR PARACENTESIS  10/30/2018  . KNEE SURGERY Left 04/2014  . [place]  Bilateral   . PERICARDIOCENTESIS N/A 01/03/2017   Procedure: Pericardiocentesis;  Surgeon: Burnell Blanks, MD;  Location: [facility];  Service: Cardiovascular;  Laterality: N/A;    Social History   Social History Narrative  . Not on file     reports that he has never smoked. He has quit using smokeless tobacco.  His smokeless tobacco use included chew. He reports that he does not  drink alcohol or use drugs.  Allergies  Allergen Reactions  . Prednisone Other (See Comments)    BLINDNESS > [ ? CSCR ? ]  . Simvastatin Other (See Comments)    MYALGIAS, JOINT PAIN  . Lorazepam Anxiety and Other (See Comments)    Nervousness and "fidgets"; legs "need to kick, "electrical currents"  . Methocarbamol Diarrhea  . Seroquel [Quetiapine Fumarate] Other (See Comments)    QT prolongation    Family History  Problem Relation Age of Onset  . Heart disease Other        No family history  . Cancer Brother     Prior  to Admission medications   Medication Sig Start Date End Date Taking? Authorizing Provider  amLODipine (NORVASC) 10 MG tablet TAKE 1 TABLET BY MOUTH EVERY DAY Patient taking differently: Take 10 mg by mouth daily.  07/23/18  Yes [name], MD  aspirin 81 MG tablet Take 81 mg by mouth at bedtime.    Yes [provider]  AURYXIA 1 GM 210 MG(Fe) tablet Take 420 mg by mouth 3 (three) times daily with meals.  03/04/18  Yes [provider]  b complex-vitamin c-folic acid (NEPHRO-VITE) 0.8 MG TABS tablet Take 1 tablet by mouth daily. 03/12/18  Yes [provider]  carvedilol (COREG) 25 MG tablet TAKE 1 TABLET TWICE A DAY WITH FOOD Patient taking differently: Take 25 mg by mouth 2 (two) times daily with a meal.  10/15/18  Yes [name], MD  docusate sodium (COLACE) 100 MG capsule Take 1 capsule (100 mg total) by mouth at bedtime. 04/24/17  Yes Alvia Grove, PA-C  furosemide (LASIX) 80 MG tablet Take 1 tablet (80 mg total) by mouth 2 (two) times daily. 09/12/18  Yes [name], MD  gabapentin (NEURONTIN) 100 MG capsule Take 2 capsules (200 mg total) by mouth daily as needed (Give one hour prior to hemodialysis session as needed for anxiety/restlessness). Patient taking differently: Take 100-200 mg by mouth See admin instructions. Take 1 capsule by mouth twice daily on Tues, Thurs, Sat, Sunday. Then take 2 capsules every morning and evening on Mon, Wed, and Fridays. 08/02/17  Yes Hall, Carole N, DO  hydrALAZINE (APRESOLINE) 25 MG tablet TAKE 1 TABLET (25 MG TOTAL) 3 (THREE) TIMES DAILY BY MOUTH. 02/13/18  Yes [name], MD  hydrOXYzine (ATARAX/VISTARIL) 25 MG tablet TAKE 1-2 TABLETS (25-50 MG TOTAL) BY MOUTH DAILY AS NEEDED. Patient taking differently: Take 25 mg by mouth 2 (two) times daily as needed for anxiety.  05/09/18  Yes [name], MD  isosorbide mononitrate (IMDUR) 30 MG 24 hr tablet TAKE 1 TABLET BY MOUTH EVERY DAY  Patient taking differently: Take 30 mg by mouth daily.  03/19/18  Yes Lelon Perla, MD  lidocaine-prilocaine (EMLA) cream APPLY THREE TIMES A WEEK AS DIRECTED 1 HOUR PRIOR TO DIALYSIS AND WRAP WITH PLASTIC WRAP Patient taking differently: Apply 1 application topically See admin instructions. APPLY THREE TIMES A WEEK AS DIRECTED 1 HOUR PRIOR TO DIALYSIS AND WRAP WITH PLASTIC WRAP 11/13/18  Yes [name], MD  omeprazole (PRILOSEC) 40 MG capsule TAKE 1 CAPSULE BY MOUTH EVERY DAY Patient taking differently: Take 40 mg by mouth daily.  10/15/18  Yes [name], MD  oxyCODONE-acetaminophen (PERCOCET) 10-325 MG tablet Take 1 tablet by mouth every 4 (four) hours as needed for pain. 10/24/18  Yes [name], MD  polyethylene glycol powder (MIRALAX) powder Take 17 g by mouth  daily as needed for moderate constipation. 04/24/17  Yes Alvia Grove, PA-C  rOPINIRole (REQUIP) 0.25 MG tablet Take 1-2 tablets (0.25-0.5 mg total) by mouth at bedtime. Patient taking differently: Take 0.25-0.5 mg by mouth at bedtime.  07/31/18  Yes [name], MD  tamsulosin (FLOMAX) 0.4 MG CAPS capsule TAKE 2 CAPSULES BY MOUTH DAILY AFTER SUPPER Patient taking differently: Take 0.8 mg by mouth daily after supper.  03/23/18  Yes [name], MD  traZODone (DESYREL) 50 MG tablet TAKE 1 TABLET AT BEDTIME Patient taking differently: Take 50 mg by mouth at bedtime.  09/06/18  Yes [name], MD  TRULICITY 2.33 AQ/7.6AU SOPN INJECT 0.75MG  EVERY 7 DAYS Patient taking differently: Inject 0.75 mg into the skin every 7 (seven) days.  01/15/18  Yes [name], MD  zolpidem (AMBIEN) 10 MG tablet TAKE 1 TABLET AT BEDTIME Patient taking differently: Take 10 mg by mouth at bedtime as needed for sleep.  10/03/17  Yes [name], MD    Physical Exam: Seen after hemodialysis  Constitutional: NAD, calm, comfortable Vitals:   11/20/18 1430 11/20/18 1500 11/20/18  1519 11/20/18 1735  BP: 137/81 (!) 150/84 (!) 150/84 (!) 147/72  Pulse: 83 89 89 90  Resp: 11 14 14 18   Temp:   97.9 F (36.6 C) 98 F (36.7 C)  TempSrc:   Oral Oral  SpO2:   98% 100%   Eyes: PERRL, lids and conjunctivae normal ENMT: Mucous membranes are moist. Posterior pharynx clear of any exudate or lesions.Normal dentition.  Neck: normal, supple, no masses, no thyromegaly Respiratory: clear to auscultation bilaterally, no wheezing, no crackles. Normal respiratory effort. No accessory muscle use.  Cardiovascular: Regular rate and rhythm, no murmurs / rubs / gallops. No extremity edema. 2+ pedal pulses. No carotid bruits.  Abdomen: no tenderness, no masses palpated. No hepatosplenomegaly. Bowel sounds positive.  Musculoskeletal: no clubbing / cyanosis. No joint deformity upper and lower extremities. Good ROM, no contractures. Normal muscle tone.  Skin: no rashes, lesions, ulcers. No induration Neurologic: CN 2-12 grossly intact. Sensation intact, DTR normal. Strength 5/5 in all 4.  Psychiatric: Normal judgment and insight. Alert and oriented x 3. Normal mood.    Labs on Admission: I have personally reviewed following labs and imaging studies  CBC: Recent Labs  Lab 11/20/18 0835  WBC 10.7*  NEUTROABS 8.4*  HGB 12.4*  HCT 40.1  MCV 90.3  PLT 633   Basic Metabolic Panel: Recent Labs  Lab 11/20/18 0835  NA 129*  K >7.5*  CL 93*  CO2 19*  GLUCOSE 232*  BUN 102*  CREATININE 10.69*  CALCIUM 8.9    Liver Function Tests: Recent Labs  Lab 11/20/18 0835  AST 15  ALT 16  ALKPHOS 83  BILITOT 0.8  PROT 7.5  ALBUMIN 3.7   Cardiac Enzymes: Recent Labs  Lab 11/20/18 0835  TROPONINI <0.03   Urine analysis:    Component Value Date/Time   COLORURINE YELLOW 07/25/2017 [place] 07/25/2017 1821   LABSPEC 1.021 07/25/2017 1821   PHURINE 5.0 07/25/2017 1821   GLUCOSEU NEGATIVE 07/25/2017 1821   HGBUR SMALL (A) 07/25/2017 [place]  07/25/2017 1821   BILIRUBINUR negative 10/24/2014 [place] 07/25/2017 1821   PROTEINUR 100 (A) 07/25/2017 1821   UROBILINOGEN 0.2 10/24/2014 1659   NITRITE NEGATIVE 07/25/2017 1821   LEUKOCYTESUR TRACE (A) 07/25/2017 1821    Radiological Exams on Admission: Dg Chest 2 View  Result Date: 11/20/2018 CLINICAL DATA:  Bradycardia EXAM: CHEST - 2 VIEW COMPARISON:  July 25, 2017 FINDINGS: There is mild scarring in the left base. There is no edema or consolidation. Heart is mildly enlarged with pulmonary vascularity normal. No adenopathy. No pneumothorax. No bone lesions. IMPRESSION: Stable scarring left base. No edema or consolidation. Stable cardiac prominence. Electronically Signed   By: Lowella Grip III M.D.   On: 11/20/2018 08:42    EKG: Independently reviewed.  EKG #1 shows junctional rhythm with left bundle branch block at 827 this morning.  No EKGs were obtained with last 1 at 1012 this morning showing the same junctional rhythm and these were after treatment.  Assessment/Plan Principal Problem:   Hyperkalemia Active Problems:   Controlled type 2 diabetes with renal manifestation (HCC)   Chronic systolic CHF (congestive heart failure), NYHA class 1 (HCC)   Hypertriglyceridemia   Essential hypertension, benign   Chronic combined systolic and diastolic heart failure ([place])   Charcot foot due to diabetes mellitus ([place])   1.  Hyperkalemia with bradycardia: Now status post hemodialysis patient's heart rhythm has significantly improved up to 89.  On telemetry his EKG is improved.  Repeat EKG is pending for the morning.  Will monitor him overnight on telemetry.  2.  Controlled type 2 diabetes with renal manifestations: Sliding scale insulin coverage and home medications as ordered.  3.  Chronic systolic congestive heart failure New York Heart Association class I: Currently compensated continue home medication regimen.  4.  Hyperlipidemia continue home medication  management.  5.  Essential hypertension: Continue home medication management.  6.  Charcot foot due to diabetes mellitus: Continue special shoe and pain control  DVT prophylaxis: Subcu heparin Code Status: Full code Family Communication: Family present at the time of evaluation.  Patient retains capacity Disposition Plan: Likely home tomorrow after hemodialysis I expect will be after 2 PM Consults called: Nephrology Admission status: Observation   Lady Deutscher MD [facility] Pager [phone]  How to contact the [facility] Attending or Consulting provider [place] or covering provider during after hours [place], for this patient?  1. Check the care team in [facility] and look for a) attending/consulting [facility] provider listed and b) the [facility] team listed 2. Log into [email or web address] and use [place]'s universal password to access. If you do not have the password, please contact the hospital operator. 3. Locate the [facility] provider you are looking for under [facility] and page to a number that you can be directly reached. 4. If you still have difficulty reaching the provider, please page the [facility] (Director on Call) for the Hospitalists listed on amion for assistance.  If 7PM-7AM, please contact night-coverage [email or web address] Password [facility]  11/20/2018, 5:38 PM

## 2018-11-20 NOTE — ED Notes (Signed)
Patient transported to X-ray 

## 2018-11-20 NOTE — Consult Note (Signed)
Renal Service Consult Note James Burke 11/20/2018 James Burke Requesting Physician:  Dr Sedonia Small  Reason for Consult:  ESRD pt w/ severe hyperkalemia HPI: The patient is a 53 y.o. year-old with hx of DM2, Cdif, DVT, GERD PNA and ESRD on HD started in May 2018.  Pt missed HD yesterday and came to ED this am for diffuse muscle weakness, inability to walk. Labs returned w/ K+ > 7.5 and junctional bradycardia w/ QRS widening on EKG.  We are asked to see for hyperkalemia.    Pt missed HD yest. Otherwise looks compliant. States he has a lots of difficulty doing dialysis and needs Neurontin pre HD and lidocaine cream on his access 30 min prior, otherwise, "I won't do dialysis".  States woke up this am and couldn't move his arms and legs like normal.  No SOB, cough, CP or abd pain. No n/v/d.  No focal weakness or speech issues.       ROS  denies CP  no joint pain   no HA  no blurry vision  no rash  no diarrhea  no nausea/ vomiting     Past Medical History  Past Medical History:  Diagnosis Date  . Anemia   . Arthritis, septic, knee ([place])   . C. difficile colitis 04/18/2008  . Congestive heart failure ([place])   . Diabetes mellitus    Type II  . DVT (deep venous thrombosis) (HCC)    right leg   . Dyspnea    "when I have too much fluid.'  . ESRD (end stage renal disease) ([place])    Tues, Thurs  . GERD (gastroesophageal reflux disease)   . History of blood transfusion 2017  . Pneumonia    Past Surgical History  Past Surgical History:  Procedure Laterality Date  . APPENDECTOMY  1995  . BASCILIC VEIN TRANSPOSITION Left 01/03/2017   Procedure: FIRST STAGE BRACHIAL VEIN TRANSPOSITION;  Surgeon: Conrad Blythe, MD;  Location: [facility];  Service: Vascular;  Laterality: Left;  . BASCILIC VEIN TRANSPOSITION Left 04/24/2017   Procedure: LEFT SECOND STAGE BRACHIAL VEIN TRANSPOSITION;  Surgeon: Conrad [place], MD;  Location: [facility];  Service: Vascular;  Laterality:  Left;  . EYE SURGERY    . INSERTION OF DIALYSIS CATHETER Right 01/03/2017   Procedure: INSERTION OF DIALYSIS CATHETER RIGHT INTERNAL JUGULAR;  Surgeon: Conrad Willows, MD;  Location: [facility];  Service: Vascular;  Laterality: Right;  . IR PARACENTESIS  12/14/2017  . IR PARACENTESIS  02/01/2018  . IR PARACENTESIS  03/28/2018  . IR PARACENTESIS  05/17/2018  . IR PARACENTESIS  05/31/2018  . IR PARACENTESIS  06/12/2018  . IR PARACENTESIS  06/26/2018  . IR PARACENTESIS  07/10/2018  . IR PARACENTESIS  07/30/2018  . IR PARACENTESIS  08/30/2018  . IR PARACENTESIS  09/13/2018  . IR PARACENTESIS  09/27/2018  . IR PARACENTESIS  10/11/2018  . IR PARACENTESIS  10/30/2018  . KNEE SURGERY Left 04/2014  . [place]  Bilateral   . PERICARDIOCENTESIS N/A 01/03/2017   Procedure: Pericardiocentesis;  Surgeon: Burnell Blanks, MD;  Location: [facility];  Service: Cardiovascular;  Laterality: N/A;   Family History  Family History  Problem Relation Age of Onset  . Heart disease Other        No family history  . Cancer Brother    Social History  reports that he has never smoked. He has quit using smokeless tobacco.  His smokeless tobacco use included  chew. He reports that he does not drink alcohol or use drugs. Allergies  Allergies  Allergen Reactions  . Prednisone Other (See Comments)    BLINDNESS > [ ? CSCR ? ]  . Simvastatin Other (See Comments)    MYALGIAS, JOINT PAIN  . Lorazepam Anxiety and Other (See Comments)    Nervousness and "fidgets"; legs "need to kick, "electrical currents"  . Methocarbamol Diarrhea  . Seroquel [Quetiapine Fumarate] Other (See Comments)    QT prolongation   Home medications Prior to Admission medications   Medication Sig Start Date End Date Taking? Authorizing Provider  amLODipine (NORVASC) 10 MG tablet TAKE 1 TABLET BY MOUTH EVERY DAY Patient taking differently: Take 10 mg by mouth daily.  07/23/18  Yes [name], MD  aspirin 81 MG tablet Take 81 mg by mouth  at bedtime.    Yes [provider]  AURYXIA 1 GM 210 MG(Fe) tablet Take 420 mg by mouth 3 (three) times daily with meals.  03/04/18  Yes [provider]  b complex-vitamin c-folic acid (NEPHRO-VITE) 0.8 MG TABS tablet Take 1 tablet by mouth daily. 03/12/18  Yes [provider]  carvedilol (COREG) 25 MG tablet TAKE 1 TABLET TWICE A DAY WITH FOOD Patient taking differently: Take 25 mg by mouth 2 (two) times daily with a meal.  10/15/18  Yes [name], MD  docusate sodium (COLACE) 100 MG capsule Take 1 capsule (100 mg total) by mouth at bedtime. 04/24/17  Yes Alvia Grove, PA-C  furosemide (LASIX) 80 MG tablet Take 1 tablet (80 mg total) by mouth 2 (two) times daily. 09/12/18  Yes [name], MD  gabapentin (NEURONTIN) 100 MG capsule Take 2 capsules (200 mg total) by mouth daily as needed (Give one hour prior to hemodialysis session as needed for anxiety/restlessness). Patient taking differently: Take 100-200 mg by mouth See admin instructions. Take 1 capsule by mouth twice daily on Tues, Thurs, Sat, Sunday. Then take 2 capsules every morning and evening on Mon, Wed, and Fridays. 08/02/17  Yes Hall, Carole N, DO  hydrALAZINE (APRESOLINE) 25 MG tablet TAKE 1 TABLET (25 MG TOTAL) 3 (THREE) TIMES DAILY BY MOUTH. 02/13/18  Yes [name], MD  hydrOXYzine (ATARAX/VISTARIL) 25 MG tablet TAKE 1-2 TABLETS (25-50 MG TOTAL) BY MOUTH DAILY AS NEEDED. Patient taking differently: Take 25 mg by mouth 2 (two) times daily as needed for anxiety.  05/09/18  Yes [name], MD  isosorbide mononitrate (IMDUR) 30 MG 24 hr tablet TAKE 1 TABLET BY MOUTH EVERY DAY Patient taking differently: Take 30 mg by mouth daily.  03/19/18  Yes Lelon Perla, MD  lidocaine-prilocaine (EMLA) cream APPLY THREE TIMES A WEEK AS DIRECTED 1 HOUR PRIOR TO DIALYSIS AND WRAP WITH PLASTIC WRAP Patient taking differently: Apply 1 application topically See admin instructions. APPLY  THREE TIMES A WEEK AS DIRECTED 1 HOUR PRIOR TO DIALYSIS AND WRAP WITH PLASTIC WRAP 11/13/18  Yes [name], MD  omeprazole (PRILOSEC) 40 MG capsule TAKE 1 CAPSULE BY MOUTH EVERY DAY Patient taking differently: Take 40 mg by mouth daily.  10/15/18  Yes [name], MD  oxyCODONE-acetaminophen (PERCOCET) 10-325 MG tablet Take 1 tablet by mouth every 4 (four) hours as needed for pain. 10/24/18  Yes [name], MD  polyethylene glycol powder (MIRALAX) powder Take 17 g by mouth daily as needed for moderate constipation. 04/24/17  Yes Virgina Jock A, PA-C  rOPINIRole (REQUIP) 0.25 MG tablet Take 1-2 tablets (  0.25-0.5 mg total) by mouth at bedtime. Patient taking differently: Take 0.25-0.5 mg by mouth at bedtime.  07/31/18  Yes [name], MD  tamsulosin (FLOMAX) 0.4 MG CAPS capsule TAKE 2 CAPSULES BY MOUTH DAILY AFTER SUPPER Patient taking differently: Take 0.8 mg by mouth daily after supper.  03/23/18  Yes [name], MD  traZODone (DESYREL) 50 MG tablet TAKE 1 TABLET AT BEDTIME Patient taking differently: Take 50 mg by mouth at bedtime.  09/06/18  Yes [name], MD  TRULICITY 1.74 BS/4.9QP SOPN INJECT 0.75MG  EVERY 7 DAYS Patient taking differently: Inject 0.75 mg into the skin every 7 (seven) days.  01/15/18  Yes [name], MD  zolpidem (AMBIEN) 10 MG tablet TAKE 1 TABLET AT BEDTIME Patient taking differently: Take 10 mg by mouth at bedtime as needed for sleep.  10/03/17  Yes [name], MD   Liver Function Tests Recent Labs  Lab 11/20/18 0835  AST 15  ALT 16  ALKPHOS 83  BILITOT 0.8  PROT 7.5  ALBUMIN 3.7   No results for input(s): LIPASE, AMYLASE in the last 168 hours. CBC Recent Labs  Lab 11/20/18 0835  WBC 10.7*  NEUTROABS 8.4*  HGB 12.4*  HCT 40.1  MCV 90.3  PLT 591   Basic Metabolic Panel Recent Labs  Lab 11/20/18 0835  NA 129*  K >7.5*  CL 93*  CO2 19*  GLUCOSE 232*  BUN 102*   CREATININE 10.69*  CALCIUM 8.9   Iron/TIBC/Ferritin/ %Sat    Component Value Date/Time   FERRITIN 111 01/05/2017 1101    Vitals:   11/20/18 0930 11/20/18 0945 11/20/18 1030 11/20/18 1041  BP: 104/69 115/66 114/63   Pulse: (!) 44 (!) 44 (!) 42 (!) 40  Resp: 16 (!) 9 17 17   Temp:      TempSrc:      SpO2: 99% 97% 98% 100%   Exam Gen alert, no distress, calm No rash, cyanosis or gangrene Sclera anicteric, throat clear  No jvd or bruits Chest clear bilat to bases, no rales or wheezing RRR no MRG Abd soft ntnd no mass or ascites +bs  GU normal male MS no joint effusions or deformity Ext no sig LE or UE edema, no wounds or ulcers Neuro is alert, Ox 3 , nf, gen'd weakness  LUA AVF+bruit    Home meds:  - amlodipine 10 qd/ carvedilol 25 bid/ furosemide 80 bid/ hydralazine 25 tid  - zolpidem 10mg  hs/ trazodone 50 hs/ ropinirole 0.25-0.5 hs prn/ oxycodone-acetaminophen 10-325 prn/ gabapentin 200mg  pre HD  - aspirin 81/ isosorbide mononitrate 30 qd  - auryxia 420mg  tid ac  - omeprazole 40 qd  - trulicity 0.75mg  every 7days sq  NW  MWF  4h  450/800   85kg  2/2 bath  LFA AVF  Hep none    Assessment: 1. Severe symptomatic hyperkalemia - w/ junctional bradycardia on EKG which is c/w ^K+ effects.  Acute Rx in ED underway, plan HD asap to get K+ down.  2. Volume - looks a little up, UF 2-3 L as tol 3. ESRD on HD MWF. HD off sched today.  4. HTN - sig on 3 meds 5. DM2 per primary 6. H/o ascites - has required recurrent paracentesis since 12/2017.     Plan: 1. As above.       [facility] 11/20/2018, 11:55 AM

## 2018-11-20 NOTE — ED Notes (Signed)
Pt transferred to dialysis.

## 2018-11-20 NOTE — Progress Notes (Signed)
Admission note:  Arrival Method: Patient admitted from ED. Mental Orientation:Alert and oriented x 4. Telemetry: N/A Assessment: See doc flow sheets. Skin: Abrasion on the left chin, charcot foot right wears boot. IV: Right FA SL. Pain: Denies any pain currently. Tubes: N/A Safety Measures: Bed in low position, call bell and phone within reach. Fall Prevention Safety Plan: Reviewed the plan, verbalized understanding. Admission Screening: In process. 5100 Orientation: Patient has been oriented to the unit, staff and to the room.

## 2018-11-21 ENCOUNTER — Encounter: Payer: Self-pay | Admitting: Family Medicine

## 2018-11-21 DIAGNOSIS — E875 Hyperkalemia: Secondary | ICD-10-CM | POA: Diagnosis not present

## 2018-11-21 LAB — GLUCOSE, CAPILLARY: Glucose-Capillary: 169 mg/dL — ABNORMAL HIGH (ref 70–99)

## 2018-11-21 LAB — BASIC METABOLIC PANEL
Anion gap: 17 — ABNORMAL HIGH (ref 5–15)
BUN: 49 mg/dL — ABNORMAL HIGH (ref 6–20)
CO2: 24 mmol/L (ref 22–32)
Calcium: 8.6 mg/dL — ABNORMAL LOW (ref 8.9–10.3)
Chloride: 95 mmol/L — ABNORMAL LOW (ref 98–111)
Creatinine, Ser: 6.71 mg/dL — ABNORMAL HIGH (ref 0.61–1.24)
GFR calc Af Amer: 10 mL/min — ABNORMAL LOW (ref >60)
GFR calc non Af Amer: 9 mL/min — ABNORMAL LOW (ref >60)
Glucose, Bld: 171 mg/dL — ABNORMAL HIGH (ref 70–99)
Potassium: 4.6 mmol/L (ref 3.5–5.1)
Sodium: 136 mmol/L (ref 135–145)

## 2018-11-21 LAB — HIV ANTIBODY (ROUTINE TESTING W REFLEX): HIV Screen 4th Generation wRfx: NONREACTIVE

## 2018-11-21 MED ORDER — OXYCODONE HCL 5 MG PO TABS
ORAL_TABLET | ORAL | Status: AC
  Filled 2018-11-21: qty 1

## 2018-11-21 MED ORDER — OXYCODONE-ACETAMINOPHEN 5-325 MG PO TABS
ORAL_TABLET | ORAL | Status: AC
  Filled 2018-11-21: qty 1

## 2018-11-21 NOTE — Discharge Summary (Signed)
Physician Discharge Summary  Patient ID: James Burke MRN: [id number] DOB/AGE: July 15, 1966 53 y.o.  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 difficile colitis.  The patient has end-stage renal disease and has been on hemodialysis since 2018.  Apparently, patient missed his hemodialysis sessions.  Subsequently, patient developed severe muscle weakness and fatigue, and could barely lift his extremities.  This necessitated patient's decision to come to the hospital.  Patient was also said to have low normal blood pressure.  On presentation to the hospital, potassium was noted to be greater than 7.5, BUN was 102.  Patient was dialyzed emergently on presentation.  Patient was admitted overnight for observation, and was dialyzed again today just prior to discharge.  Patient's symptoms have resolved.  Patient is eager to be discharged back home.  Need to comply with hemodialysis was discussed with the patient extensively.  Patient will follow with a primary care provider and nephrology team on discharge.  Consults: nephrology  Significant Diagnostic Studies:  -On presentation to the hospital, potassium was greater than 7.5, BUN was 102 and serum creatinine was 10.69. -Prior to discharge, the potassium was 4.6.  Discharge Exam: Blood pressure 132/67, pulse 81, temperature 98 F (36.7 C), temperature source Oral, resp. rate 18, weight  89.2 kg, SpO2 99 %.   Disposition: Discharge disposition: 01-Home or Self Care   Discharge Instructions    Diet - low sodium heart healthy   Complete by:  As directed    Increase activity slowly   Complete by:  As directed      Allergies as of 11/21/2018      Reactions   Prednisone Other (See Comments)   BLINDNESS > [ ? CSCR ? ]   Simvastatin Other (See Comments)   MYALGIAS, JOINT PAIN   Lorazepam Anxiety, Other (See Comments)   Nervousness and "fidgets"; legs "need to kick, "electrical currents"   Methocarbamol Diarrhea   Seroquel [quetiapine Fumarate] Other (See Comments)   QT prolongation      Medication List    TAKE these medications   amLODipine 10 MG tablet Commonly known as:  NORVASC TAKE 1 TABLET BY MOUTH EVERY DAY   aspirin 81 MG tablet Take 81 mg by mouth at bedtime.   Auryxia 1 GM 210 MG(Fe) tablet Generic drug:  ferric citrate Take 420 mg by mouth 3 (three) times daily with meals.   b complex-vitamin c-folic acid 0.8 MG Tabs tablet Take 1 tablet by mouth daily.   carvedilol 25 MG tablet Commonly known as:  COREG TAKE 1 TABLET TWICE A DAY WITH FOOD   docusate sodium 100 MG capsule Commonly known as:  COLACE Take 1 capsule (100 mg total) by mouth at bedtime.   furosemide 80 MG tablet Commonly known as:  LASIX Take 1 tablet (80 mg total) by mouth 2 (two) times daily.   gabapentin 100 MG capsule Commonly known as:  NEURONTIN Take 2 capsules (200 mg total) by mouth daily as needed (Give one hour  prior to hemodialysis session as needed for anxiety/restlessness). What changed:    how much to take  when to take this  additional instructions   hydrALAZINE 25 MG tablet Commonly known as:  APRESOLINE TAKE 1 TABLET (25 MG TOTAL) 3 (THREE) TIMES DAILY BY MOUTH.   hydrOXYzine 25 MG tablet Commonly known as:  ATARAX/VISTARIL TAKE 1-2 TABLETS (25-50 MG TOTAL) BY MOUTH DAILY AS NEEDED. What changed:    how much to take  when to take  this  reasons to take this   isosorbide mononitrate 30 MG 24 hr tablet Commonly known as:  IMDUR TAKE 1 TABLET BY MOUTH EVERY DAY   lidocaine-prilocaine cream Commonly known as:  EMLA APPLY THREE TIMES A WEEK AS DIRECTED 1 HOUR PRIOR TO DIALYSIS AND WRAP WITH PLASTIC WRAP What changed:    how much to take  how to take this  when to take this   omeprazole 40 MG capsule Commonly known as:  PRILOSEC TAKE 1 CAPSULE BY MOUTH EVERY DAY What changed:  how much to take   oxyCODONE-acetaminophen 10-325 MG tablet Commonly known as:  PERCOCET Take 1 tablet by mouth every 4 (four) hours as needed for pain.   polyethylene glycol powder powder Commonly known as:  MiraLax Take 17 g by mouth daily as needed for moderate constipation.   rOPINIRole 0.25 MG tablet Commonly known as:  REQUIP Take 1-2 tablets (0.25-0.5 mg total) by mouth at bedtime. What changed:    how much to take  how to take this  when to take this  additional instructions   tamsulosin 0.4 MG Caps capsule Commonly known as:  FLOMAX TAKE 2 CAPSULES BY MOUTH DAILY AFTER SUPPER What changed:  See the new instructions.   traZODone 50 MG tablet Commonly known as:  DESYREL TAKE 1 TABLET AT BEDTIME   Trulicity 3.81 WE/9.9BZ Sopn Generic drug:  Dulaglutide INJECT 0.75MG  EVERY 7 DAYS What changed:  See the new instructions.   zolpidem 10 MG tablet Commonly known as:  AMBIEN TAKE 1 TABLET AT BEDTIME What changed:    when to take this  reasons to take this        Signed: Bonnell Public 11/21/2018, 12:57 PM

## 2018-11-21 NOTE — Progress Notes (Signed)
[facility] Progress Note  Subjective: no new c/o, "I can move my arms now".   Vitals:   11/21/18 1000 11/21/18 1030 11/21/18 1100 11/21/18 1117  BP: (!) 102/53 (!) 102/59 (!) 115/50 117/63  Pulse: 70 72 76 78  Resp: 18 17 18 18   Temp:    98 F (36.7 C)  TempSrc:    Axillary  SpO2:    95%  Weight:    89.2 kg    Inpatient medications: . amLODipine  10 mg Oral q1800  . aspirin  81 mg Oral QHS  . carvedilol  25 mg Oral BID WC  . Chlorhexidine Gluconate Cloth  6 each Topical Q0600  . Chlorhexidine Gluconate Cloth  6 each Topical Q0600  . docusate sodium  100 mg Oral QHS  . ferric citrate  420 mg Oral TID WC  . furosemide  80 mg Oral BID  . gabapentin  100 mg Oral 2 times per day on Sun Tue Thu Sat   And  . gabapentin  200 mg Oral 2 times per day on Mon Wed Fri  . heparin  5,000 Units Subcutaneous Q8H  . hydrALAZINE  25 mg Oral TID  . insulin aspart  0-9 Units Subcutaneous TID WC  . isosorbide mononitrate  30 mg Oral Daily  . lidocaine-prilocaine  1 application Topical Q M,W,F-HD  . LORazepam  0.5 mg Intravenous Once  . multivitamin  1 tablet Oral QHS  . pantoprazole  80 mg Oral q1800  . rOPINIRole  0.25-0.5 mg Oral QHS  . tamsulosin  0.8 mg Oral QPC supper  . traZODone  50 mg Oral QHS   . albuterol 10 mg/hr (11/20/18 1041)   alteplase, heparin, hydrOXYzine, lidocaine (PF), lidocaine-prilocaine, oxyCODONE-acetaminophen **AND** oxyCODONE, pentafluoroprop-tetrafluoroeth, polyethylene glycol, zolpidem  Iron/TIBC/Ferritin/ %Sat    Component Value Date/Time   FERRITIN 111 01/05/2017 1101    Exam: Gen alert, no distress, calm No rash, cyanosis or gangrene Sclera anicteric, throat clear  No jvd or bruits Chest clear bilat to bases, no rales or wheezing RRR no MRG Abd soft ntnd no mass or ascites +bs  GU normal male MS no joint effusions or deformity Ext no sig LE or UE edema, no wounds or ulcers Neuro is alert, Ox 3 , nf, gen'd weakness  LUA  AVF+bruit    Home meds:  - amlodipine 10 qd/ carvedilol 25 bid/ furosemide 80 bid/ hydralazine 25 tid  - zolpidem 10mg  hs/ trazodone 50 hs/ ropinirole 0.25-0.5 hs prn/ oxycodone-acetaminophen 10-325 prn/ gabapentin 200mg  pre HD  - aspirin 81/ isosorbide mononitrate 30 qd  - auryxia 420mg  tid ac  - omeprazole 40 qd  - trulicity 0.75mg  every 7days sq  NW  MWF  4h  450/800   85kg  2/2 bath  LFA AVF  Hep none    Assessment/ Rec: 1. Severe symptomatic hyperkalemia - w/ junctional bradycardia on EKG. Back in NSR after HD yest.  K+ 4.6 this am.  2. Volume - up 7-8 kg pre HD this am. Got > 4 L off on HD this am.  3. ESRD on HD MWF. On HD again today.   4. HTN - sig issue on 3 BP meds 5. DM2 per primary 6. H/o ascites - has required recurrent paracentesis since 12/2017. 7. Dispo - stable for d/c from renal standpoint after HD this am.    [facility] 11/21/2018, 12:10 PM  Recent Labs  Lab 11/20/18 0835 11/21/18 0616  NA 129* 136  K >  7.5* 4.6  CL 93* 95*  CO2 19* 24  GLUCOSE 232* 171*  BUN 102* 49*  CREATININE 10.69* 6.71*  CALCIUM 8.9 8.6*  ALBUMIN 3.7  --    Recent Labs  Lab 11/20/18 0835  AST 15  ALT 16  ALKPHOS 83  BILITOT 0.8  PROT 7.5   Recent Labs  Lab 11/20/18 0835  WBC 10.7*  NEUTROABS 8.4*  HGB 12.4*  HCT 40.1  MCV 90.3  PLT 176

## 2018-11-21 NOTE — Progress Notes (Signed)
Patient discharged to home. Patient AVS reviewed and signed. Patient capable re-verbalizing medications and follow-up appointments. IV removed. Patient belongings sent with patient. Patient educated to return to the ED in the event of SOB, chest pain or dizziness.   Mateen Franssen B. RN 

## 2018-11-22 ENCOUNTER — Other Ambulatory Visit: Payer: Self-pay | Admitting: Family Medicine

## 2018-11-22 MED ORDER — OXYCODONE-ACETAMINOPHEN 10-325 MG PO TABS: 1.0000 | ORAL_TABLET | ORAL | 0 refills | Status: DC | PRN

## 2018-11-29 ENCOUNTER — Ambulatory Visit (HOSPITAL_COMMUNITY)
Admission: RE | Admit: 2018-11-29 | Discharge: 2018-11-29 | Disposition: A | Discharge status: Home / Self Care | Payer: BLUE CROSS/BLUE SHIELD | Source: Ambulatory Visit | Attending: Nephrology | Admitting: Nephrology

## 2018-11-29 ENCOUNTER — Encounter (HOSPITAL_COMMUNITY): Payer: Self-pay | Admitting: Student

## 2018-11-29 ENCOUNTER — Other Ambulatory Visit: Payer: Self-pay

## 2018-11-29 DIAGNOSIS — R188 Other ascites: Secondary | ICD-10-CM | POA: Diagnosis not present

## 2018-11-29 HISTORY — PX: IR PARACENTESIS: IMG2679

## 2018-11-29 MED ORDER — LIDOCAINE HCL 1 % IJ SOLN
INTRAMUSCULAR | Status: AC | PRN
  Administered 2018-11-29: 10 mL

## 2018-11-29 MED ORDER — LIDOCAINE HCL 1 % IJ SOLN
INTRAMUSCULAR | Status: AC
  Filled 2018-11-29: qty 20

## 2018-11-29 NOTE — Procedures (Signed)
PROCEDURE SUMMARY:  Successful image-guided paracentesis from the right lower abdomen.  Yielded 2.8 liters of clear yellow fluid.  No immediate complications.  EBL = 5 mL. Patient tolerated well.   Specimen was not sent for labs.  Claris Pong Tywon Niday PA-C 11/29/2018 11:06 AM

## 2018-12-09 ENCOUNTER — Other Ambulatory Visit: Payer: Self-pay | Admitting: Family Medicine

## 2018-12-17 ENCOUNTER — Encounter: Payer: Self-pay | Admitting: Family Medicine

## 2018-12-17 ENCOUNTER — Other Ambulatory Visit: Payer: Self-pay | Admitting: Family Medicine

## 2018-12-18 MED ORDER — OXYCODONE-ACETAMINOPHEN 10-325 MG PO TABS: 1.0000 | ORAL_TABLET | ORAL | 0 refills | Status: DC | PRN

## 2018-12-19 ENCOUNTER — Encounter: Payer: Self-pay | Admitting: Family Medicine

## 2018-12-27 ENCOUNTER — Encounter: Payer: Self-pay | Admitting: Family Medicine

## 2018-12-27 ENCOUNTER — Ambulatory Visit (INDEPENDENT_AMBULATORY_CARE_PROVIDER_SITE_OTHER): Payer: BLUE CROSS/BLUE SHIELD | Admitting: Family Medicine

## 2018-12-27 VITALS — Temp 97.8°F | Wt 210.0 lb

## 2018-12-27 DIAGNOSIS — Z794 Long term (current) use of insulin: Secondary | ICD-10-CM

## 2018-12-27 DIAGNOSIS — Z992 Dependence on renal dialysis: Secondary | ICD-10-CM

## 2018-12-27 DIAGNOSIS — M1712 Unilateral primary osteoarthritis, left knee: Secondary | ICD-10-CM

## 2018-12-27 DIAGNOSIS — E11610 Type 2 diabetes mellitus with diabetic neuropathic arthropathy: Secondary | ICD-10-CM

## 2018-12-27 DIAGNOSIS — N186 End stage renal disease: Secondary | ICD-10-CM | POA: Diagnosis not present

## 2018-12-27 DIAGNOSIS — E1122 Type 2 diabetes mellitus with diabetic chronic kidney disease: Secondary | ICD-10-CM

## 2018-12-27 DIAGNOSIS — G8929 Other chronic pain: Secondary | ICD-10-CM

## 2018-12-27 MED ORDER — OXYCODONE-ACETAMINOPHEN 10-325 MG PO TABS: 1.0000 | ORAL_TABLET | ORAL | 0 refills | Status: DC | PRN

## 2018-12-27 NOTE — Progress Notes (Signed)
Called pt and he informed me that he was just leaving an appt when I called him. Maryruth Eve, Lahoma Crocker, CMA

## 2018-12-27 NOTE — Progress Notes (Signed)
Virtual Visit via Video Note  I connected with James Burke on 12/27/18 at 11:10 AM EDT by a video enabled telemedicine application and verified that I am speaking with the correct person using two identifiers.   I discussed the limitations of evaluation and management by telemedicine and the availability of in person appointments. The patient expressed understanding and agreed to proceed.  Patient was sitting in his car and I was sitting in my home offcie for this encouter.   Subjective:    CC: 3 mo Diabetic f/u  HPI: 53 year old male was recently admitted to the hospital on March 17 and discharged on the following day on March 18 for hyperkalemia.  He has a history of end-stage renal disease and is on dialysis since 2018.  He developed severe muscle weakness and fatigue and can barely lift his extremities.  He went to the hospital to be evaluated.  His potassium was 7.5 and BUN was 105.  He was Diamox dialyzed admitted for overnight observation.  He was feeling much better.He had missed dialysis treatment.   Diabetes - no hypoglycemic events. No wounds or sores that are not healing well. No increased thirst or urination. Checking glucose at home. Taking medications as prescribed without any side effects.  Chronic pain management for his charcot foot - his lef knee has been bothreing him.  Has been wearing a support sleeve. Doing well on pain medications.   Past medical history, Surgical history, Family history not pertinant except as noted below, Social history, Allergies, and medications have been entered into the medical record, reviewed, and corrections made.   Review of Systems: No fevers, chills, night sweats, weight loss, chest pain, or shortness of breath.   Objective:    General: Speaking clearly in complete sentences without any shortness of breath.  Alert and oriented x3.  Normal judgment. No apparent acute distress.    Impression and Recommendations:    ESRD - doing  well at dialysis. Saw D.r Dunaham yesterday. Hyerkalemia seemed to have resolved. Not sure  Cathlean Marse[place] it happened.     DM -  Stable. Sugars have been [place] recently. Will have to wait to get next A1C. F/U in 3 months.    Chronic pain management /left knee and left foot(charcot).  Takes 1-2 tabs at bedtime and one before dialysis.  Indication for chronic opioid: Left foot/Charcot foot, and left knee pain Medication and dose: Percocet 10/325mg  # pills per month: 90 Last UDS date: 06/2017, patient on dialysis unable to give a urine.  Opioid Treatment Agreement signed (Y/N): Yes Opioid Treatment Agreement last reviewed with patient:   N[facility] reviewed this encounter (include red flags):  Y      I discussed the assessment and treatment plan with the patient. The patient was provided an opportunity to ask questions and all were answered. The patient agreed with the plan and demonstrated an understanding of the instructions.   The patient was advised to call back or seek an in-person evaluation if the symptoms worsen or if the condition fails to improve as anticipated.   James Lecher, MD

## 2019-01-16 ENCOUNTER — Other Ambulatory Visit: Payer: Self-pay | Admitting: Family Medicine

## 2019-01-22 ENCOUNTER — Other Ambulatory Visit: Payer: Self-pay

## 2019-01-22 ENCOUNTER — Ambulatory Visit (HOSPITAL_COMMUNITY)
Admission: RE | Admit: 2019-01-22 | Discharge: 2019-01-22 | Disposition: A | Discharge status: Home / Self Care | Payer: BLUE CROSS/BLUE SHIELD | Source: Ambulatory Visit | Attending: Nephrology | Admitting: Nephrology

## 2019-01-22 ENCOUNTER — Encounter (HOSPITAL_COMMUNITY): Payer: Self-pay | Admitting: Student

## 2019-01-22 DIAGNOSIS — R188 Other ascites: Secondary | ICD-10-CM | POA: Insufficient documentation

## 2019-01-22 HISTORY — PX: IR PARACENTESIS: IMG2679

## 2019-01-22 MED ORDER — LIDOCAINE HCL 1 % IJ SOLN
INTRAMUSCULAR | Status: AC
  Filled 2019-01-22: qty 20

## 2019-01-22 MED ORDER — LIDOCAINE HCL 1 % IJ SOLN
INTRAMUSCULAR | Status: AC | PRN
  Administered 2019-01-22: 10 mL

## 2019-01-22 NOTE — Procedures (Signed)
PROCEDURE SUMMARY:  Successful image-guided paracentesis from the right lower abdomen.  Yielded 3.9 liters of clear yellow fluid.  No immediate complications.  EBL = 5 mL. Patient tolerated well.   Specimen was not sent for labs.  Claris Pong Louk PA-C 01/22/2019 10:57 AM

## 2019-02-11 ENCOUNTER — Encounter: Payer: Self-pay | Admitting: Family Medicine

## 2019-02-12 MED ORDER — OXYCODONE-ACETAMINOPHEN 10-325 MG PO TABS: 1.0000 | ORAL_TABLET | ORAL | 0 refills | Status: DC | PRN

## 2019-02-21 ENCOUNTER — Other Ambulatory Visit: Payer: Self-pay

## 2019-02-21 ENCOUNTER — Ambulatory Visit (HOSPITAL_COMMUNITY)
Admission: RE | Admit: 2019-02-21 | Discharge: 2019-02-21 | Disposition: A | Discharge status: Home / Self Care | Payer: BC Managed Care – PPO | Source: Ambulatory Visit | Attending: Nephrology | Admitting: Nephrology

## 2019-02-21 ENCOUNTER — Encounter (HOSPITAL_COMMUNITY): Payer: Self-pay | Admitting: Radiology

## 2019-02-21 DIAGNOSIS — R188 Other ascites: Secondary | ICD-10-CM | POA: Diagnosis not present

## 2019-02-21 HISTORY — PX: IR PARACENTESIS: IMG2679

## 2019-02-21 MED ORDER — LIDOCAINE HCL (PF) 1 % IJ SOLN
INTRAMUSCULAR | Status: DC | PRN
  Administered 2019-02-21: 10 mL

## 2019-02-21 MED ORDER — LIDOCAINE HCL 1 % IJ SOLN
INTRAMUSCULAR | Status: AC
  Filled 2019-02-21: qty 20

## 2019-02-21 NOTE — Procedures (Signed)
  PROCEDURE SUMMARY:  Successful US guided paracentesis from RLQ.  Yielded 6.8 L of clear yellow fluid.  No immediate complications.  Pt tolerated well.   Specimen was not sent for labs.  EBL < 51mL  Ascencion Dike PA-C 02/21/2019 2:32 PM

## 2019-03-14 ENCOUNTER — Encounter: Payer: Self-pay | Admitting: Family Medicine

## 2019-03-15 ENCOUNTER — Telehealth: Payer: Self-pay

## 2019-03-15 MED ORDER — OXYCODONE-ACETAMINOPHEN 10-325 MG PO TABS: 1.0000 | ORAL_TABLET | ORAL | 0 refills | Status: DC | PRN

## 2019-03-15 NOTE — Telephone Encounter (Signed)
Please call and schedule patient for a follow up DM/medication management appointment for the very near future.

## 2019-03-15 NOTE — Telephone Encounter (Signed)
Lvm for patient to call to schedule.  Thanks.

## 2019-03-16 ENCOUNTER — Other Ambulatory Visit: Payer: Self-pay | Admitting: Family Medicine

## 2019-03-18 ENCOUNTER — Other Ambulatory Visit (HOSPITAL_COMMUNITY): Payer: Self-pay | Admitting: Nephrology

## 2019-03-18 DIAGNOSIS — R188 Other ascites: Secondary | ICD-10-CM

## 2019-03-21 ENCOUNTER — Ambulatory Visit (HOSPITAL_COMMUNITY)
Admission: RE | Admit: 2019-03-21 | Discharge: 2019-03-21 | Disposition: A | Discharge status: Home / Self Care | Payer: BC Managed Care – PPO | Source: Ambulatory Visit | Attending: Nephrology | Admitting: Nephrology

## 2019-03-21 ENCOUNTER — Other Ambulatory Visit: Payer: Self-pay

## 2019-03-21 ENCOUNTER — Encounter (HOSPITAL_COMMUNITY): Payer: Self-pay | Admitting: Radiology

## 2019-03-21 DIAGNOSIS — R188 Other ascites: Secondary | ICD-10-CM | POA: Insufficient documentation

## 2019-03-21 HISTORY — PX: IR PARACENTESIS: IMG2679

## 2019-03-21 MED ORDER — LIDOCAINE HCL (PF) 1 % IJ SOLN
INTRAMUSCULAR | Status: DC | PRN
  Administered 2019-03-21: 10 mL

## 2019-03-21 MED ORDER — LIDOCAINE HCL 1 % IJ SOLN
INTRAMUSCULAR | Status: AC
  Filled 2019-03-21: qty 20

## 2019-03-21 NOTE — Procedures (Signed)
Ultrasound-guided  therapeutic paracentesis performed yielding 8 liters of yellow fluid. No immediate complications. EBL none.

## 2019-03-26 ENCOUNTER — Telehealth (INDEPENDENT_AMBULATORY_CARE_PROVIDER_SITE_OTHER): Payer: BC Managed Care – PPO | Admitting: Family Medicine

## 2019-03-26 ENCOUNTER — Encounter: Payer: Self-pay | Admitting: Family Medicine

## 2019-03-26 VITALS — BP 132/76 | Temp 97.2°F | Ht 72.0 in

## 2019-03-26 DIAGNOSIS — E11610 Type 2 diabetes mellitus with diabetic neuropathic arthropathy: Secondary | ICD-10-CM

## 2019-03-26 DIAGNOSIS — E1122 Type 2 diabetes mellitus with diabetic chronic kidney disease: Secondary | ICD-10-CM

## 2019-03-26 DIAGNOSIS — G8929 Other chronic pain: Secondary | ICD-10-CM | POA: Diagnosis not present

## 2019-03-26 DIAGNOSIS — I10 Essential (primary) hypertension: Secondary | ICD-10-CM | POA: Diagnosis not present

## 2019-03-26 DIAGNOSIS — M1712 Unilateral primary osteoarthritis, left knee: Secondary | ICD-10-CM

## 2019-03-26 DIAGNOSIS — Z794 Long term (current) use of insulin: Secondary | ICD-10-CM

## 2019-03-26 MED ORDER — OXYCODONE-ACETAMINOPHEN 10-325 MG PO TABS: 1.0000 | ORAL_TABLET | ORAL | 0 refills | Status: DC | PRN

## 2019-03-26 MED ORDER — GABAPENTIN 100 MG PO CAPS: 200.0000 mg | ORAL_CAPSULE | Freq: Two times a day (BID) | ORAL | 0 refills | Status: DC | PRN

## 2019-03-26 NOTE — Progress Notes (Signed)
Virtual Visit via Video Note  I connected with James Burke on 03/26/19 at  1:00 PM EDT by a video enabled telemedicine application and verified that I am speaking with the correct person using two identifiers.   I discussed the limitations of evaluation and management by telemedicine and the availability of in person appointments. The patient expressed understanding and agreed to proceed.  Pt was at home and I was in my office for the virtual visit.      Established Patient Office Visit  Subjective:  Patient ID: James Burke, male    DOB: Jan 13, 1966  Age: 53 y.o. MRN: [id number]  CC:  Chief Complaint  Patient presents with  . Diabetes    HPI James Burke presents for   Diabetes - no hypoglycemic events. No wounds or sores that are not healing well. No increased thirst or urination. Checking glucose at home. Taking medications as prescribed without any side effects. Last a1C 6.5 a few days ago. Says forgot to take his last shot.   Hypertension- Pt denies chest pain, SOB, dizziness, or heart palpitations.  Taking meds as directed w/o problems.  Denies medication side effects.   Had a pericentisis last Thursday.  Also did an extra dialysis today as well.   F/U Chronic pain management - he is doing well with her current regimen. Does OK,just depends on how much he is on it.    Past Medical History:  Diagnosis Date  . Anemia   . Arthritis, septic, knee ([place])   . C. difficile colitis 04/18/2008  . Congestive heart failure ([place])   . Diabetes mellitus    Type II  . DVT (deep venous thrombosis) ([facility])    right leg   . Dyspnea    "when I have too much fluid.'  . ESRD (end stage renal disease) ([place])    Tues, Thurs  . GERD (gastroesophageal reflux disease)   . History of blood transfusion 2017  . Pneumonia     Past Surgical History:  Procedure Laterality Date  . APPENDECTOMY  1995  . BASCILIC VEIN TRANSPOSITION Left 01/03/2017   Procedure: FIRST STAGE BRACHIAL VEIN  TRANSPOSITION;  Surgeon: Conrad Enfield, MD;  Location: [facility];  Service: Vascular;  Laterality: Left;  . BASCILIC VEIN TRANSPOSITION Left 04/24/2017   Procedure: LEFT SECOND STAGE BRACHIAL VEIN TRANSPOSITION;  Surgeon: Conrad Madisonburg, MD;  Location: [facility];  Service: Vascular;  Laterality: Left;  . EYE SURGERY    . INSERTION OF DIALYSIS CATHETER Right 01/03/2017   Procedure: INSERTION OF DIALYSIS CATHETER RIGHT INTERNAL JUGULAR;  Surgeon: Conrad Sedan, MD;  Location: [facility];  Service: Vascular;  Laterality: Right;  . IR PARACENTESIS  12/14/2017  . IR PARACENTESIS  02/01/2018  . IR PARACENTESIS  03/28/2018  . IR PARACENTESIS  05/17/2018  . IR PARACENTESIS  05/31/2018  . IR PARACENTESIS  06/12/2018  . IR PARACENTESIS  06/26/2018  . IR PARACENTESIS  07/10/2018  . IR PARACENTESIS  07/30/2018  . IR PARACENTESIS  08/30/2018  . IR PARACENTESIS  09/13/2018  . IR PARACENTESIS  09/27/2018  . IR PARACENTESIS  10/11/2018  . IR PARACENTESIS  10/30/2018  . IR PARACENTESIS  11/29/2018  . IR PARACENTESIS  01/22/2019  . IR PARACENTESIS  02/21/2019  . IR PARACENTESIS  03/21/2019  . KNEE SURGERY Left 04/2014  . [place]  Bilateral   . PERICARDIOCENTESIS N/A 01/03/2017   Procedure: Pericardiocentesis;  Surgeon: Burnell Blanks, MD;  Location: [facility];  Service: Cardiovascular;  Laterality: N/A;    Family History  Problem Relation Age of Onset  . Heart disease Other        No family history  . Cancer Brother     Social History   Socioeconomic History  . Marital status: Married    Spouse name: Not on file  . Number of children: 3  . Years of education: Not on file  . Highest education level: Not on file  Occupational History  . Not on file  Social Needs  . Financial resource strain: Not on file  . Food insecurity    Worry: Not on file    Inability: Not on file  . Transportation needs    Medical: Not on file    Non-medical: Not on file  Tobacco Use  . Smoking status: Never Smoker  . Smokeless  tobacco: Former Systems developer    Types: Chew  . Tobacco comment: no chewing 01/2017  Substance and Sexual Activity  . Alcohol use: No    Alcohol/week: 0.0 standard drinks  . Drug use: No  . Sexual activity: Yes  Lifestyle  . Physical activity    Days per week: Not on file    Minutes per session: Not on file  . Stress: Not on file  Relationships  . Social Herbalist on phone: Not on file    Gets together: Not on file    Attends religious service: Not on file    Active member of club or organization: Not on file    Attends meetings of clubs or organizations: Not on file    Relationship status: Not on file  . Intimate partner violence    Fear of current or ex partner: Not on file    Emotionally abused: Not on file    Physically abused: Not on file    Forced sexual activity: Not on file  Other Topics Concern  . Not on file  Social History Narrative  . Not on file    Outpatient Medications Prior to Visit  Medication Sig Dispense Refill  . amLODipine (NORVASC) 10 MG tablet TAKE 1 TABLET BY MOUTH EVERY DAY (Patient taking differently: Take 10 mg by mouth daily. ) 90 tablet 3  . aspirin 81 MG tablet Take 81 mg by mouth at bedtime.     Lorin Picket 1 GM 210 MG(Fe) tablet Take 420 mg by mouth 3 (three) times daily with meals.   0  . b complex-vitamin c-folic acid (NEPHRO-VITE) 0.8 MG TABS tablet Take 1 tablet by mouth daily.  6  . carvedilol (COREG) 25 MG tablet TAKE 1 TABLET TWICE A DAY WITH FOOD (Patient taking differently: Take 25 mg by mouth 2 (two) times daily with a meal. ) 180 tablet 1  . docusate sodium (COLACE) 100 MG capsule Take 1 capsule (100 mg total) by mouth at bedtime.    . furosemide (LASIX) 80 MG tablet Take 1 tablet (80 mg total) by mouth 2 (two) times daily. 180 tablet 3  . hydrALAZINE (APRESOLINE) 25 MG tablet TAKE 1 TABLET (25 MG TOTAL) 3 (THREE) TIMES DAILY BY MOUTH. 90 tablet 1  . isosorbide mononitrate (IMDUR) 30 MG 24 hr tablet TAKE 1 TABLET BY MOUTH EVERY DAY  (Patient taking differently: Take 30 mg by mouth daily. ) 90 tablet 3  . lidocaine-prilocaine (EMLA) cream APPLY THREE TIMES A WEEK AS DIRECTED 1 HOUR PRIOR TO DIALYSIS AND WRAP WITH PLASTIC WRAP (Patient taking differently: Apply 1 application topically See  admin instructions. APPLY THREE TIMES A WEEK AS DIRECTED 1 HOUR PRIOR TO DIALYSIS AND WRAP WITH PLASTIC WRAP) 30 g 3  . omeprazole (PRILOSEC) 40 MG capsule TAKE 1 CAPSULE BY MOUTH EVERY DAY (Patient taking differently: Take 40 mg by mouth daily. ) 90 capsule 1  . polyethylene glycol powder (MIRALAX) powder Take 17 g by mouth daily as needed for moderate constipation.    Marland Kitchen rOPINIRole (REQUIP) 0.25 MG tablet TAKE 1 TO 2 TABLETS AT BEDTIME 180 tablet 1  . tamsulosin (FLOMAX) 0.4 MG CAPS capsule TAKE 2 CAPSULES BY MOUTH DAILY AFTER SUPPER 180 capsule 2  . traZODone (DESYREL) 50 MG tablet TAKE 1 TABLET AT BEDTIME 90 tablet 0  . TRULICITY 4.70 JG/2.8ZM SOPN INJECT 0.'75MG'$  EVERY 7 DAYS 6 pen 6  . gabapentin (NEURONTIN) 100 MG capsule Take 2 capsules (200 mg total) by mouth daily as needed (Give one hour prior to hemodialysis session as needed for anxiety/restlessness). (Patient taking differently: Take 100-200 mg by mouth See admin instructions. Take 1 capsule by mouth twice daily on Tues, Thurs, Sat, Sunday. Then take 2 capsules every morning and evening on Mon, Wed, and Fridays.) 15 capsule 0  . oxyCODONE-acetaminophen (PERCOCET) 10-325 MG tablet Take 1 tablet by mouth every 4 (four) hours as needed for pain. 90 tablet 0  . zolpidem (AMBIEN) 10 MG tablet TAKE 1 TABLET AT BEDTIME (Patient taking differently: Take 10 mg by mouth at bedtime as needed for sleep. ) 90 tablet 0   No facility-administered medications prior to visit.     Allergies  Allergen Reactions  . Prednisone Other (See Comments)    BLINDNESS > [ ? CSCR ? ]  . Simvastatin Other (See Comments)    MYALGIAS, JOINT PAIN  . Lorazepam Anxiety and Other (See Comments)    Nervousness and  "fidgets"; legs "need to kick, "electrical currents"  . Methocarbamol Diarrhea  . Seroquel [Quetiapine Fumarate] Other (See Comments)    QT prolongation    ROS Review of Systems    Objective:    Physical Exam   Speaking without any difficulty or increased work of breathing.  Well-groomed.  Sitting in his chair at home.  BP 132/76   Temp (!) 97.2 F (36.2 C)   Ht 6' (1.829 m)   BMI 28.48 kg/m  Wt Readings from Last 3 Encounters:  12/27/18 210 lb (95.3 kg)  11/21/18 196 lb 10.4 oz (89.2 kg)  08/23/18 205 lb (93 kg)     Health Maintenance Due  Topic Date Due  . OPHTHALMOLOGY EXAM  10/06/2018  . HEMOGLOBIN A1C  02/22/2019    There are no preventive care reminders to display for this patient.  Lab Results  Component Value Date   TSH 2.218 12/30/2016   Lab Results  Component Value Date   WBC 10.7 (H) 11/20/2018   HGB 12.4 (L) 11/20/2018   HCT 40.1 11/20/2018   MCV 90.3 11/20/2018   PLT 176 11/20/2018   Lab Results  Component Value Date   NA 136 11/21/2018   K 4.6 11/21/2018   CO2 24 11/21/2018   GLUCOSE 171 (H) 11/21/2018   BUN 49 (H) 11/21/2018   CREATININE 6.71 (H) 11/21/2018   BILITOT 0.8 11/20/2018   ALKPHOS 83 11/20/2018   AST 15 11/20/2018   ALT 16 11/20/2018   PROT 7.5 11/20/2018   ALBUMIN 3.7 11/20/2018   CALCIUM 8.6 (L) 11/21/2018   ANIONGAP 17 (H) 11/21/2018   EGFR 26.0 04/16/2014   Lab Results  Component  Value Date   CHOL 115 04/06/2018   Lab Results  Component Value Date   HDL 31 (L) 04/06/2018   Lab Results  Component Value Date   LDLCALC 62 04/06/2018   Lab Results  Component Value Date   TRIG 141 04/06/2018   Lab Results  Component Value Date   CHOLHDL 3.7 04/06/2018   Lab Results  Component Value Date   HGBA1C 6.1 (A) 08/23/2018      Assessment & Plan:   Problem List Items Addressed This Visit      Cardiovascular and Mediastinum   Essential hypertension, benign    Well controlled. Continue current regimen.  Follow up in  6 months.        Endocrine   Controlled type 2 diabetes with renal manifestation (HCC) - Primary (Chronic)    Well controlled. Continue current regimen. Follow up in  4 months.       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  Opioid Treatment Agreement signed (Y/N): Yes Opioid Treatment Agreement last reviewed with patient:   NCCSRS reviewed this encounter (include red flags):  Y         Ok to try to decrease his flomax to one tab daily and if no big change then ok to discontinue   Also to cover his gabapentin prescription.  Dr. DLorrene Reidhas been writing this for him.  He takes 2 capsules daily but then takes an extra capsule on dialysis days and every now and again he has an extra dialysis session that will require treatment.  New prescription sent to pharmacy for 90-day supply.  Meds ordered this encounter  Medications  . oxyCODONE-acetaminophen (PERCOCET) 10-325 MG tablet    Sig: Take 1 tablet by mouth every 4 (four) hours as needed for pain.    Dispense:  90 tablet    Refill:  0  . gabapentin (NEURONTIN) 100 MG capsule    Sig: Take 2 capsules (200 mg total) by mouth 2 (two) times daily as needed (Give one hour prior to hemodialysis session as needed for anxiety/restlessness).    Dispense:  220 capsule    Refill:  0    Follow-up: No follow-ups on file.    CBeatrice Lecher MD     I discussed the assessment and treatment plan with  the patient. The patient was provided an opportunity to ask questions and all were answered. The patient agreed with the plan and demonstrated an understanding of the instructions.   The patient was advised to call back or seek an in-person evaluation if the symptoms worsen or if the condition fails to improve as anticipated.   CBeatrice Lecher MD

## 2019-03-26 NOTE — Assessment & Plan Note (Signed)
Well controlled. Continue current regimen. Follow up in  6 months.  

## 2019-03-26 NOTE — Assessment & Plan Note (Signed)
Indication for chronic opioid: Left foot/Charcot foot, and left knee pain Medication and dose: Percocet 10/325mg  # pills per month: 90 Last UDS date: 06/2017, patient on dialysis unable to give a urine.  Opioid Treatment Agreement signed (Y/N): Yes Opioid Treatment Agreement last reviewed with patient:   NCCSRS reviewed this encounter (include red flags):  Darreld Mclean

## 2019-03-26 NOTE — Progress Notes (Signed)
Asked pt about eye exam and he is undergoing surgery for his eyes.   Maryruth Eve, Lahoma Crocker, CMA

## 2019-03-26 NOTE — Assessment & Plan Note (Signed)
Well controlled. Continue current regimen. Follow up in  4 months.   

## 2019-03-29 ENCOUNTER — Other Ambulatory Visit: Payer: Self-pay | Admitting: Nephrology

## 2019-03-29 ENCOUNTER — Other Ambulatory Visit (HOSPITAL_COMMUNITY): Payer: Self-pay | Admitting: Nephrology

## 2019-03-29 DIAGNOSIS — R188 Other ascites: Secondary | ICD-10-CM

## 2019-04-02 ENCOUNTER — Encounter (HOSPITAL_COMMUNITY): Payer: Self-pay | Admitting: Student

## 2019-04-02 ENCOUNTER — Other Ambulatory Visit: Payer: Self-pay

## 2019-04-02 ENCOUNTER — Ambulatory Visit (HOSPITAL_COMMUNITY)
Admission: RE | Admit: 2019-04-02 | Discharge: 2019-04-02 | Disposition: A | Discharge status: Home / Self Care | Payer: BC Managed Care – PPO | Source: Ambulatory Visit | Attending: Nephrology | Admitting: Nephrology

## 2019-04-02 ENCOUNTER — Encounter: Payer: Self-pay | Admitting: Family Medicine

## 2019-04-02 DIAGNOSIS — R188 Other ascites: Secondary | ICD-10-CM | POA: Diagnosis not present

## 2019-04-02 HISTORY — PX: IR PARACENTESIS: IMG2679

## 2019-04-02 MED ORDER — LIDOCAINE HCL 1 % IJ SOLN
INTRAMUSCULAR | Status: AC | PRN
  Administered 2019-04-02: 10 mL

## 2019-04-02 MED ORDER — LIDOCAINE HCL 1 % IJ SOLN
INTRAMUSCULAR | Status: AC
  Filled 2019-04-02: qty 20

## 2019-04-02 NOTE — Procedures (Signed)
PROCEDURE SUMMARY:  Successful image-guided paracentesis from the right lateral abdomen.  Yielded 6.6 liters of clear yellow fluid.  No immediate complications.  EBL = 0 mL. Patient tolerated well.   Specimen was not sent for labs.  Claris Pong Taliah Porche PA-C 04/02/2019 9:32 AM

## 2019-04-03 NOTE — Telephone Encounter (Signed)
RX pended, please advise

## 2019-04-04 ENCOUNTER — Other Ambulatory Visit: Payer: Self-pay | Admitting: Family Medicine

## 2019-04-04 MED ORDER — ISOSORBIDE MONONITRATE ER 30 MG PO TB24: 30.0000 mg | ORAL_TABLET | Freq: Every day | ORAL | 3 refills | Status: DC

## 2019-04-04 NOTE — Telephone Encounter (Signed)
Ok rx sent.

## 2019-04-05 ENCOUNTER — Encounter: Payer: Self-pay | Admitting: Family Medicine

## 2019-04-05 NOTE — Telephone Encounter (Signed)
Prescription was sent yesterday at 7:30 in the morning.  Did call him personally and asked him to just check with the pharmacy to make sure before the weekend that they have it.  [facility].

## 2019-04-11 ENCOUNTER — Encounter: Payer: Self-pay | Admitting: Family Medicine

## 2019-04-15 ENCOUNTER — Other Ambulatory Visit: Payer: Self-pay | Admitting: Nephrology

## 2019-04-15 DIAGNOSIS — R188 Other ascites: Secondary | ICD-10-CM

## 2019-04-18 ENCOUNTER — Encounter (HOSPITAL_COMMUNITY): Payer: Self-pay | Admitting: Radiology

## 2019-04-18 ENCOUNTER — Other Ambulatory Visit: Payer: Self-pay

## 2019-04-18 ENCOUNTER — Ambulatory Visit (HOSPITAL_COMMUNITY)
Admission: RE | Admit: 2019-04-18 | Discharge: 2019-04-18 | Disposition: A | Discharge status: Home / Self Care | Payer: BC Managed Care – PPO | Source: Ambulatory Visit | Attending: Nephrology | Admitting: Nephrology

## 2019-04-18 DIAGNOSIS — R188 Other ascites: Secondary | ICD-10-CM | POA: Diagnosis not present

## 2019-04-18 HISTORY — PX: IR PARACENTESIS: IMG2679

## 2019-04-18 MED ORDER — LIDOCAINE HCL 1 % IJ SOLN
INTRAMUSCULAR | Status: AC
  Filled 2019-04-18: qty 20

## 2019-04-18 MED ORDER — LIDOCAINE HCL 1 % IJ SOLN
INTRAMUSCULAR | Status: DC | PRN
  Administered 2019-04-18: 10 mL

## 2019-04-18 NOTE — Procedures (Signed)
Ultrasound-guided  therapeutic paracentesis performed yielding 6.9 liters of yellow  fluid. No immediate complications. EBL none.

## 2019-04-27 ENCOUNTER — Other Ambulatory Visit: Payer: Self-pay | Admitting: Family Medicine

## 2019-04-29 ENCOUNTER — Other Ambulatory Visit: Payer: Self-pay

## 2019-04-30 ENCOUNTER — Other Ambulatory Visit (HOSPITAL_COMMUNITY): Payer: Self-pay | Admitting: Nephrology

## 2019-04-30 ENCOUNTER — Other Ambulatory Visit: Payer: Self-pay | Admitting: Nephrology

## 2019-04-30 DIAGNOSIS — R188 Other ascites: Secondary | ICD-10-CM

## 2019-05-01 ENCOUNTER — Telehealth: Payer: Self-pay

## 2019-05-01 NOTE — Telephone Encounter (Signed)
Needs to go to pcp has not been seen since 2018.

## 2019-05-01 NOTE — Telephone Encounter (Signed)
[facility]os[facility]ide. Please advise if OK to refill. Last filled by PCP. Thank you!

## 2019-05-02 ENCOUNTER — Encounter (HOSPITAL_COMMUNITY): Payer: Self-pay | Admitting: Radiology

## 2019-05-02 ENCOUNTER — Ambulatory Visit (HOSPITAL_COMMUNITY)
Admission: RE | Admit: 2019-05-02 | Discharge: 2019-05-02 | Disposition: A | Discharge status: Home / Self Care | Payer: BC Managed Care – PPO | Source: Ambulatory Visit | Attending: Nephrology | Admitting: Nephrology

## 2019-05-02 ENCOUNTER — Other Ambulatory Visit: Payer: Self-pay

## 2019-05-02 DIAGNOSIS — R188 Other ascites: Secondary | ICD-10-CM | POA: Insufficient documentation

## 2019-05-02 HISTORY — PX: IR PARACENTESIS: IMG2679

## 2019-05-02 MED ORDER — LIDOCAINE HCL (PF) 1 % IJ SOLN
INTRAMUSCULAR | Status: DC | PRN
  Administered 2019-05-02: 10 mL

## 2019-05-02 MED ORDER — LIDOCAINE HCL 1 % IJ SOLN
INTRAMUSCULAR | Status: AC
  Filled 2019-05-02: qty 20

## 2019-05-07 ENCOUNTER — Encounter: Payer: Self-pay | Admitting: Family Medicine

## 2019-05-08 ENCOUNTER — Other Ambulatory Visit: Payer: Self-pay

## 2019-05-08 MED ORDER — OXYCODONE-ACETAMINOPHEN 10-325 MG PO TABS: 1.0000 | ORAL_TABLET | ORAL | 0 refills | Status: DC | PRN

## 2019-05-10 DIAGNOSIS — L299 Pruritus, unspecified: Secondary | ICD-10-CM | POA: Insufficient documentation

## 2019-05-14 ENCOUNTER — Other Ambulatory Visit (HOSPITAL_COMMUNITY): Payer: Self-pay | Admitting: Nephrology

## 2019-05-14 DIAGNOSIS — R188 Other ascites: Secondary | ICD-10-CM

## 2019-05-16 ENCOUNTER — Other Ambulatory Visit: Payer: Self-pay

## 2019-05-16 ENCOUNTER — Encounter (HOSPITAL_COMMUNITY): Payer: Self-pay | Admitting: Radiology

## 2019-05-16 ENCOUNTER — Ambulatory Visit (HOSPITAL_COMMUNITY)
Admission: RE | Admit: 2019-05-16 | Discharge: 2019-05-16 | Disposition: A | Discharge status: Home / Self Care | Payer: BC Managed Care – PPO | Source: Ambulatory Visit | Attending: Nephrology | Admitting: Nephrology

## 2019-05-16 DIAGNOSIS — R188 Other ascites: Secondary | ICD-10-CM

## 2019-05-16 HISTORY — PX: IR PARACENTESIS: IMG2679

## 2019-05-16 MED ORDER — LIDOCAINE HCL 1 % IJ SOLN
INTRAMUSCULAR | Status: AC
  Filled 2019-05-16: qty 20

## 2019-05-16 NOTE — Procedures (Signed)
Ultrasound-guided  therapeutic paracentesis performed yielding 7.2 liters of yellow fluid. No immediate complications. EBL < 1cc.

## 2019-05-20 ENCOUNTER — Telehealth: Payer: Self-pay | Admitting: Family Medicine

## 2019-05-20 NOTE — Telephone Encounter (Signed)
Call pt: please see if he is willing to try another statin even if just takes it twice a week.  Studies have shown benefit even if not taking it daily .

## 2019-05-21 NOTE — Telephone Encounter (Signed)
Patient declines. Does not even want to consider.

## 2019-05-29 ENCOUNTER — Other Ambulatory Visit: Payer: Self-pay | Admitting: Nephrology

## 2019-05-29 ENCOUNTER — Encounter: Payer: Self-pay | Admitting: Family Medicine

## 2019-05-29 ENCOUNTER — Other Ambulatory Visit (HOSPITAL_COMMUNITY): Payer: Self-pay | Admitting: Nephrology

## 2019-05-29 DIAGNOSIS — E8779 Other fluid overload: Secondary | ICD-10-CM

## 2019-05-29 MED ORDER — OXYCODONE-ACETAMINOPHEN 10-325 MG PO TABS: 1.0000 | ORAL_TABLET | ORAL | 0 refills | Status: DC | PRN

## 2019-05-29 NOTE — Telephone Encounter (Signed)
Oxy sent. I agree traz sent for 90 day

## 2019-05-30 ENCOUNTER — Encounter: Payer: Self-pay | Admitting: Family Medicine

## 2019-05-30 ENCOUNTER — Encounter (HOSPITAL_COMMUNITY): Payer: Self-pay | Admitting: Physician Assistant

## 2019-05-30 ENCOUNTER — Other Ambulatory Visit: Payer: Self-pay

## 2019-05-30 ENCOUNTER — Ambulatory Visit (HOSPITAL_COMMUNITY)
Admission: RE | Admit: 2019-05-30 | Discharge: 2019-05-30 | Disposition: A | Discharge status: Home / Self Care | Payer: BC Managed Care – PPO | Source: Ambulatory Visit | Attending: Nephrology | Admitting: Nephrology

## 2019-05-30 DIAGNOSIS — R188 Other ascites: Secondary | ICD-10-CM | POA: Diagnosis not present

## 2019-05-30 DIAGNOSIS — E8779 Other fluid overload: Secondary | ICD-10-CM | POA: Diagnosis not present

## 2019-05-30 HISTORY — PX: IR PARACENTESIS: IMG2679

## 2019-05-30 LAB — BODY FLUID CELL COUNT WITH DIFFERENTIAL
Lymphs, Fluid: 13 %
Monocyte-Macrophage-Serous Fluid: 45 % — ABNORMAL LOW (ref 50–90)
Neutrophil Count, Fluid: 42 % — ABNORMAL HIGH (ref 0–25)
Total Nucleated Cell Count, Fluid: 878 cu mm (ref 0–1000)

## 2019-05-30 LAB — GRAM STAIN

## 2019-05-30 LAB — CBC
HCT: 32.4 % — ABNORMAL LOW (ref 39.0–52.0)
Hemoglobin: 10.6 g/dL — ABNORMAL LOW (ref 13.0–17.0)
MCH: 29.9 pg (ref 26.0–34.0)
MCHC: 32.7 g/dL (ref 30.0–36.0)
MCV: 91.5 fL (ref 80.0–100.0)
Platelets: 239 10*3/uL (ref 150–400)
RBC: 3.54 MIL/uL — ABNORMAL LOW (ref 4.22–5.81)
RDW: 14.3 % (ref 11.5–15.5)
WBC: 8.6 10*3/uL (ref 4.0–10.5)
nRBC: 0 % (ref 0.0–0.2)

## 2019-05-30 LAB — PROTIME-INR
INR: 1.2 (ref 0.8–1.2)
Prothrombin Time: 14.9 seconds (ref 11.4–15.2)

## 2019-05-30 MED ORDER — LIDOCAINE HCL (PF) 1 % IJ SOLN
INTRAMUSCULAR | Status: DC | PRN
  Administered 2019-05-30: 10 mL

## 2019-05-30 MED ORDER — LIDOCAINE HCL 1 % IJ SOLN
INTRAMUSCULAR | Status: AC
  Filled 2019-05-30: qty 20

## 2019-05-30 NOTE — Procedures (Signed)
PROCEDURE SUMMARY:  Successful US guided paracentesis from right lateral abdomen.  Yielded 7.1 liters of bloody fluid.  No immediate complications.  Patient tolerated well.  EBL = trace  Spoke with Dr. Lorrene Reid regarding change in appearance of fluid.  Will order CBC, INR, and send fluid for cytology, culture, gram stain, and cell count.  Judie Grieve Luverne Farone PA-C 05/30/2019 12:27 PM

## 2019-05-31 ENCOUNTER — Other Ambulatory Visit: Payer: Self-pay | Admitting: Cardiology

## 2019-05-31 MED ORDER — OXYCODONE-ACETAMINOPHEN 10-325 MG PO TABS: 1.0000 | ORAL_TABLET | ORAL | 0 refills | Status: DC | PRN

## 2019-05-31 NOTE — Telephone Encounter (Signed)
New rx sent

## 2019-05-31 NOTE — Telephone Encounter (Signed)
Spoke with pharmacy - because the Rx is controlled the cannot override the date written. New Rx will need to be sent. Routing.

## 2019-05-31 NOTE — Telephone Encounter (Signed)
Ok, will need to call pharmacy and give them verbal to fill early the oxycodone as it had date of 10/1 on it.  Let pt know when done.

## 2019-05-31 NOTE — Telephone Encounter (Signed)
Outpatient Medication Detail   Disp Refills Start End   isosorbide mononitrate (IMDUR) 30 MG 24 hr tablet 90 tablet 3 04/04/2019    Sig - Route: Take 1 tablet (30 mg total) by mouth daily. - Oral   Sent to pharmacy as: isosorbide mononitrate (IMDUR) 30 MG 24 hr tablet   E-Prescribing Status: Receipt confirmed by pharmacy (04/04/2019 7:35 AM EDT)   Pharmacy  [id number] - [place] - [address]

## 2019-06-04 LAB — CULTURE, BODY FLUID W GRAM STAIN -BOTTLE: Culture: NO GROWTH

## 2019-06-05 LAB — CYTOLOGY - NON PAP

## 2019-06-10 ENCOUNTER — Encounter: Payer: Self-pay | Admitting: Family Medicine

## 2019-06-13 DIAGNOSIS — T782XXS Anaphylactic shock, unspecified, sequela: Secondary | ICD-10-CM | POA: Insufficient documentation

## 2019-06-14 ENCOUNTER — Other Ambulatory Visit: Payer: Self-pay

## 2019-06-14 ENCOUNTER — Other Ambulatory Visit (HOSPITAL_COMMUNITY): Payer: Self-pay | Admitting: Nephrology

## 2019-06-14 ENCOUNTER — Encounter (HOSPITAL_COMMUNITY): Payer: Self-pay | Admitting: Radiology

## 2019-06-14 ENCOUNTER — Ambulatory Visit (HOSPITAL_COMMUNITY)
Admission: RE | Admit: 2019-06-14 | Discharge: 2019-06-14 | Disposition: A | Discharge status: Home / Self Care | Payer: BC Managed Care – PPO | Source: Ambulatory Visit | Attending: Nephrology | Admitting: Nephrology

## 2019-06-14 DIAGNOSIS — N186 End stage renal disease: Secondary | ICD-10-CM

## 2019-06-14 DIAGNOSIS — I509 Heart failure, unspecified: Secondary | ICD-10-CM

## 2019-06-14 DIAGNOSIS — R188 Other ascites: Secondary | ICD-10-CM | POA: Diagnosis not present

## 2019-06-14 HISTORY — PX: IR PARACENTESIS: IMG2679

## 2019-06-14 MED ORDER — LIDOCAINE HCL (PF) 1 % IJ SOLN
INTRAMUSCULAR | Status: DC | PRN
  Administered 2019-06-14: 10 mL

## 2019-06-14 MED ORDER — LIDOCAINE HCL 1 % IJ SOLN
INTRAMUSCULAR | Status: AC
  Filled 2019-06-14: qty 20

## 2019-06-14 NOTE — Procedures (Signed)
PROCEDURE SUMMARY:  Successful US guided paracentesis from RLQ.  Yielded 6.8 L of clear dark yellow fluid.  No immediate complications.  Pt tolerated well.   Specimen was not sent for labs.  EBL < 13mL  Ascencion Dike PA-C 06/14/2019 3:11 PM

## 2019-06-16 IMAGING — US US PARACENTESIS
1 series · 10 of 10 positions shown · non-contrast
Comparison: None.

MEDICATIONS:
10 cc 1% lidocaine.

COMPLICATIONS:
None immediate.

INDICATION: ascites

EXAM:
ULTRASOUND-GUIDED PARACENTESIS
TECHNIQUE: Informed written consent was obtained from the patient after a
discussion of the risks, benefits and alternatives to treatment. A
timeout was performed prior to the initiation of the procedure.

[Series 1: us paracentesis · 0.28mm/px · 10 of 10 slices shown]
[im 1/10]
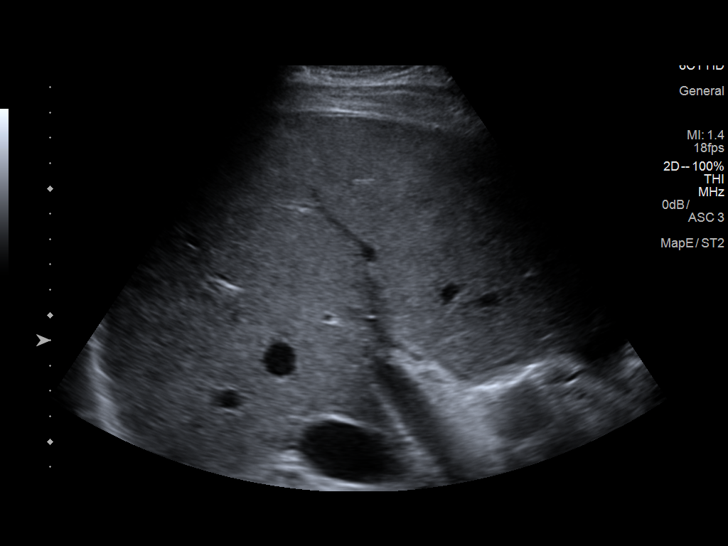
[im 2/10]
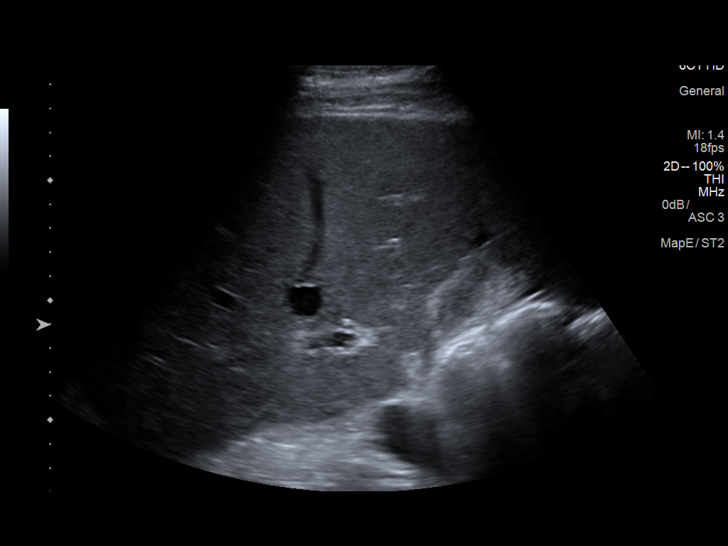
[im 3/10]
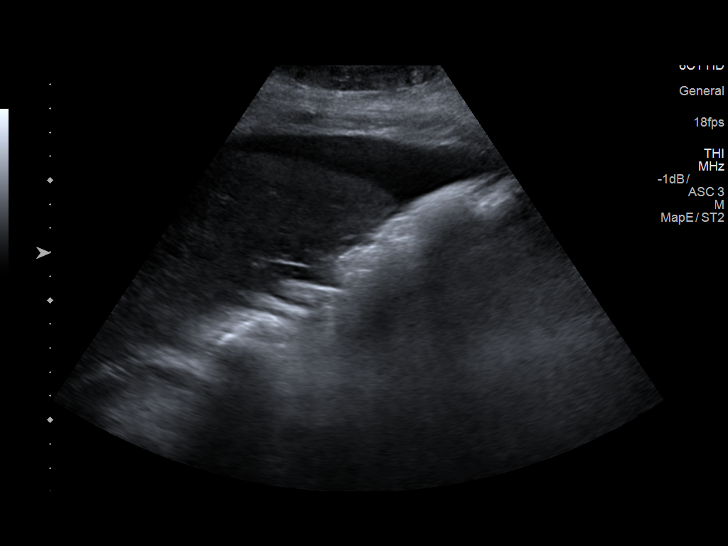
[im 4/10]
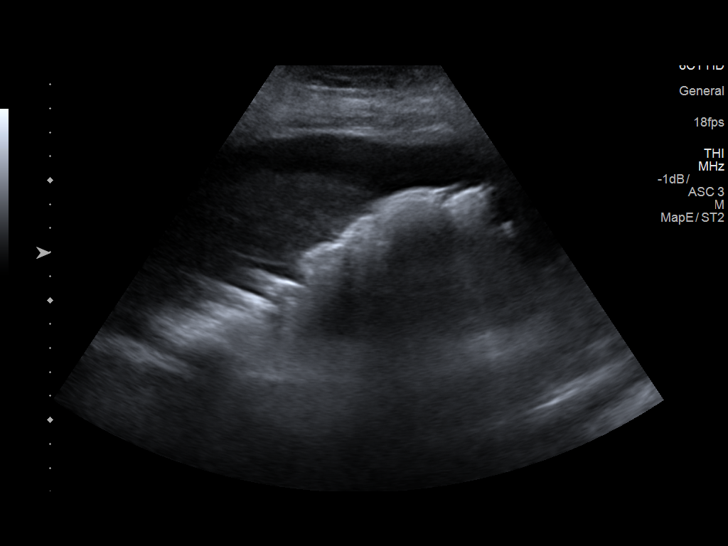
[im 5/10]
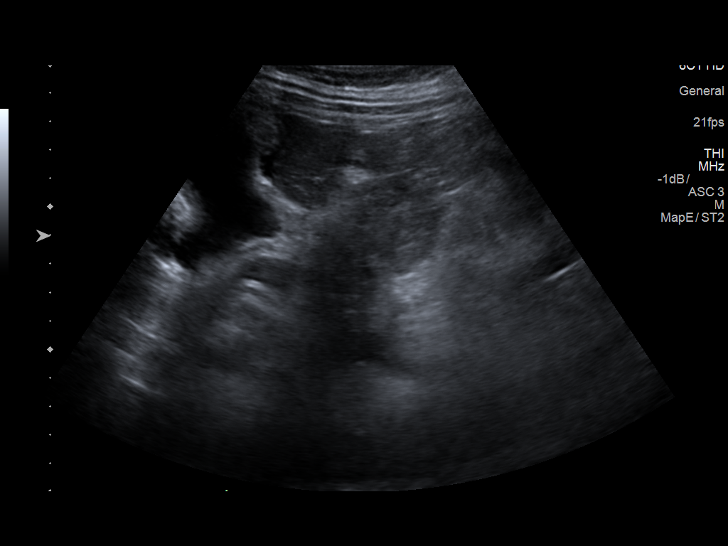
[im 6/10]
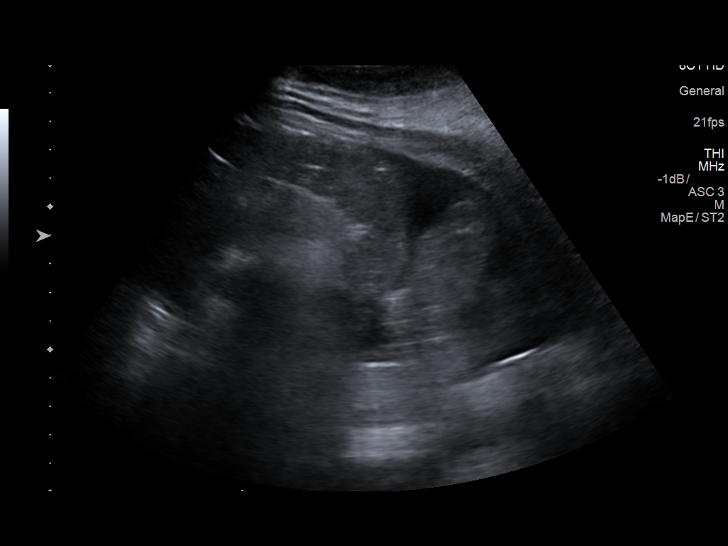
[im 7/10]
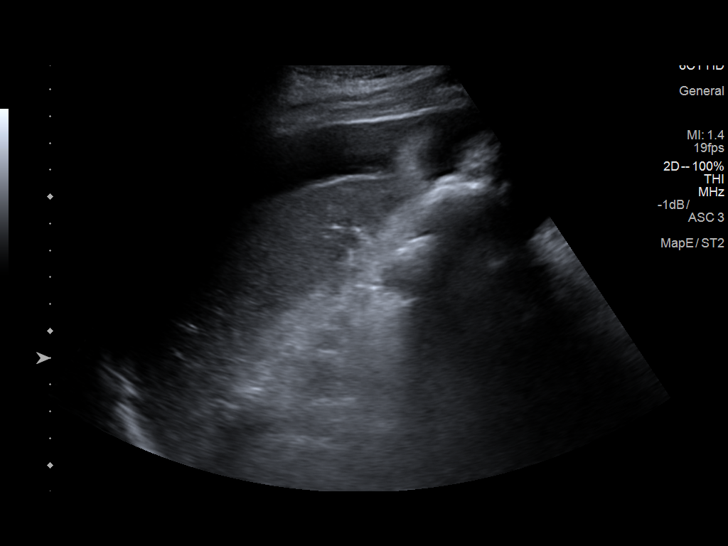
[im 8/10]
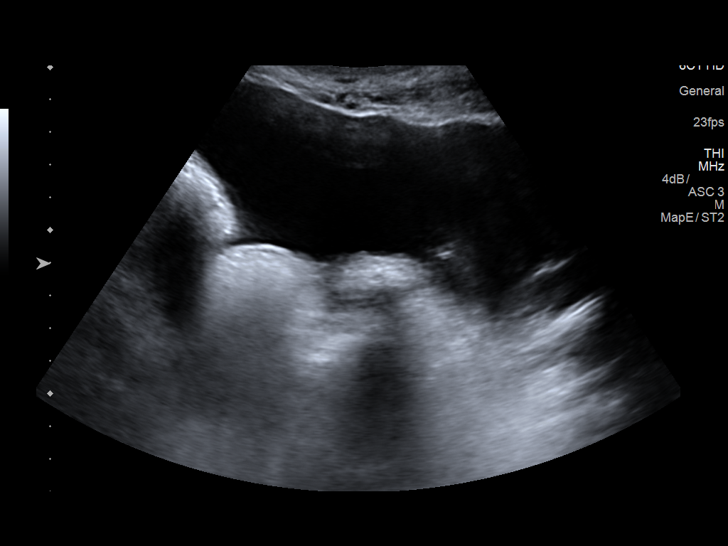
[im 9/10]
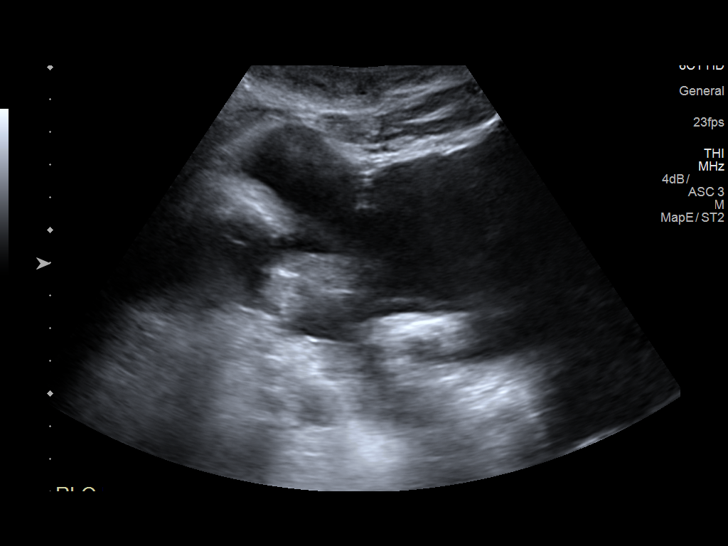
[im 10/10]
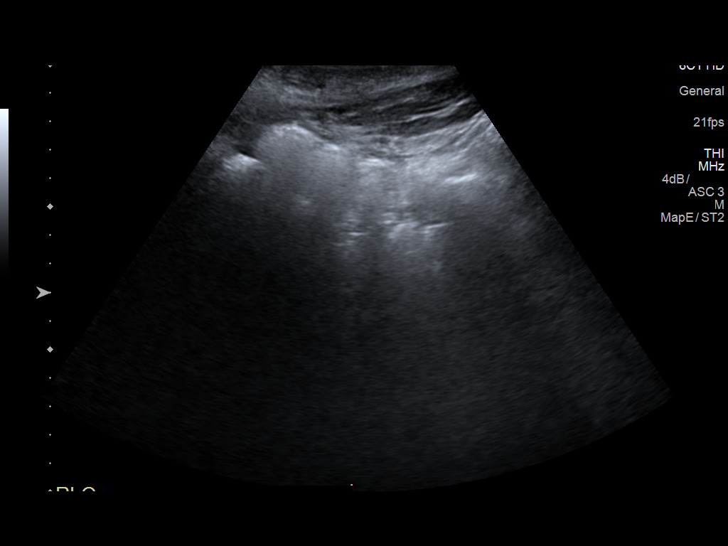

[10 of 10 positions shown; findings below may reference images not displayed]

Initial ultrasound scanning demonstrates a large amount of ascites
within the right lower abdominal quadrant. The right lower abdomen
was prepped and draped in the usual sterile fashion. 1% lidocaine
with epinephrine was used for local anesthesia. Under direct
ultrasound guidance, a 19 gauge, 7-cm, Yueh catheter was introduced.
An ultrasound image was saved for documentation purposed. The
paracentesis was performed. The catheter was removed and a dressing
was applied. The patient tolerated the procedure well without
immediate post procedural complication.
FINDINGS: A total of approximately 4.6 liters of amber fluid was removed.
IMPRESSION: Successful ultrasound-guided paracentesis yielding 4.6 liters of
peritoneal fluid.

Read by

Elisha Migiho Balnabe

## 2019-06-26 ENCOUNTER — Encounter: Payer: Self-pay | Admitting: Family Medicine

## 2019-06-26 MED ORDER — OXYCODONE-ACETAMINOPHEN 10-325 MG PO TABS: 1.0000 | ORAL_TABLET | ORAL | 0 refills | Status: DC | PRN

## 2019-06-26 NOTE — Telephone Encounter (Signed)
Last UDS 08/16/18  Last appt 03/26/19  RX pended

## 2019-06-26 NOTE — Telephone Encounter (Signed)
Med sent needs to f/u next month in person if possible

## 2019-06-27 ENCOUNTER — Other Ambulatory Visit: Payer: Self-pay

## 2019-06-27 ENCOUNTER — Ambulatory Visit (HOSPITAL_COMMUNITY)
Admission: RE | Admit: 2019-06-27 | Discharge: 2019-06-27 | Disposition: A | Discharge status: Home / Self Care | Payer: BC Managed Care – PPO | Source: Ambulatory Visit | Attending: Nephrology | Admitting: Nephrology

## 2019-06-27 ENCOUNTER — Encounter (HOSPITAL_COMMUNITY): Payer: Self-pay | Admitting: Student

## 2019-06-27 DIAGNOSIS — N186 End stage renal disease: Secondary | ICD-10-CM

## 2019-06-27 DIAGNOSIS — I509 Heart failure, unspecified: Secondary | ICD-10-CM

## 2019-06-27 DIAGNOSIS — R188 Other ascites: Secondary | ICD-10-CM | POA: Insufficient documentation

## 2019-06-27 HISTORY — PX: IR PARACENTESIS: IMG2679

## 2019-06-27 MED ORDER — LIDOCAINE HCL (PF) 1 % IJ SOLN
INTRAMUSCULAR | Status: AC | PRN
  Administered 2019-06-27: 10 mL

## 2019-06-27 MED ORDER — LIDOCAINE HCL 1 % IJ SOLN
INTRAMUSCULAR | Status: AC
  Filled 2019-06-27: qty 20

## 2019-06-27 NOTE — Procedures (Signed)
PROCEDURE SUMMARY:  Successful US guided paracentesis from right lateral abdomen.  Yielded 6.7 liters of yellow fluid.  No immediate complications.  Pt tolerated well.   Specimen was not sent for labs.  EBL < 38mL  Docia Barrier PA-C 06/27/2019 1:31 PM

## 2019-06-29 ENCOUNTER — Other Ambulatory Visit: Payer: Self-pay | Admitting: Family Medicine

## 2019-07-02 ENCOUNTER — Ambulatory Visit: Payer: BC Managed Care – PPO | Admitting: Family Medicine

## 2019-07-02 NOTE — Progress Notes (Deleted)
Established Patient Office Visit  Subjective:  Patient ID: James Burke, male    DOB: Feb 09, 1966  Age: 53 y.o. MRN: [id number]  CC: No chief complaint on file.   HPI James Burke presents for   Hypertension- Pt denies chest pain, SOB, dizziness, or heart palpitations.  Taking meds as directed w/o problems.  Denies medication side effects.    Diabetes - no hypoglycemic events. No wounds or sores that are not healing well. No increased thirst or urination. Checking glucose at home. Taking medications as prescribed without any side effects.   Past Medical History:  Diagnosis Date  . Anemia   . Arthritis, septic, knee ([place])   . C. difficile colitis 04/18/2008  . Congestive heart failure ([place])   . Diabetes mellitus    Type II  . DVT (deep venous thrombosis) (HCC)    right leg   . Dyspnea    "when I have too much fluid.'  . ESRD (end stage renal disease) ([place])    Tues, Thurs  . GERD (gastroesophageal reflux disease)   . History of blood transfusion 2017  . Pneumonia     Past Surgical History:  Procedure Laterality Date  . APPENDECTOMY  1995  . BASCILIC VEIN TRANSPOSITION Left 01/03/2017   Procedure: FIRST STAGE BRACHIAL VEIN TRANSPOSITION;  Surgeon: Conrad Keweenaw, MD;  Location: [facility];  Service: Vascular;  Laterality: Left;  . BASCILIC VEIN TRANSPOSITION Left 04/24/2017   Procedure: LEFT SECOND STAGE BRACHIAL VEIN TRANSPOSITION;  Surgeon: Conrad Smithton, MD;  Location: [facility];  Service: Vascular;  Laterality: Left;  . EYE SURGERY    . INSERTION OF DIALYSIS CATHETER Right 01/03/2017   Procedure: INSERTION OF DIALYSIS CATHETER RIGHT INTERNAL JUGULAR;  Surgeon: Conrad Collegeville, MD;  Location: [facility];  Service: Vascular;  Laterality: Right;  . IR PARACENTESIS  12/14/2017  . IR PARACENTESIS  02/01/2018  . IR PARACENTESIS  03/28/2018  . IR PARACENTESIS  05/17/2018  . IR PARACENTESIS  05/31/2018  . IR PARACENTESIS  06/12/2018  . IR PARACENTESIS  06/26/2018  . IR PARACENTESIS  07/10/2018  .  IR PARACENTESIS  07/30/2018  . IR PARACENTESIS  08/30/2018  . IR PARACENTESIS  09/13/2018  . IR PARACENTESIS  09/27/2018  . IR PARACENTESIS  10/11/2018  . IR PARACENTESIS  10/30/2018  . IR PARACENTESIS  11/29/2018  . IR PARACENTESIS  01/22/2019  . IR PARACENTESIS  02/21/2019  . IR PARACENTESIS  03/21/2019  . IR PARACENTESIS  04/02/2019  . IR PARACENTESIS  04/18/2019  . IR PARACENTESIS  05/02/2019  . IR PARACENTESIS  05/16/2019  . IR PARACENTESIS  05/30/2019  . IR PARACENTESIS  06/14/2019  . IR PARACENTESIS  06/27/2019  . KNEE SURGERY Left 04/2014  . Lazer  Bilateral   . PERICARDIOCENTESIS N/A 01/03/2017   Procedure: Pericardiocentesis;  Surgeon: Burnell Blanks, MD;  Location: [facility];  Service: Cardiovascular;  Laterality: N/A;    Family History  Problem Relation Age of Onset  . Heart disease Other        No family history  . Cancer Brother     Social History   Socioeconomic History  . Marital status: Married    Spouse name: Not on file  . Number of children: 3  . Years of education: Not on file  . Highest education level: Not on file  Occupational History  . Not on file  Social Needs  . Financial resource strain: Not on file  . Food  insecurity    Worry: Not on file    Inability: Not on file  . Transportation needs    Medical: Not on file    Non-medical: Not on file  Tobacco Use  . Smoking status: Never Smoker  . Smokeless tobacco: Former Systems developer    Types: Chew  . Tobacco comment: no chewing 01/2017  Substance and Sexual Activity  . Alcohol use: No    Alcohol/week: 0.0 standard drinks  . Drug use: No  . Sexual activity: Yes  Lifestyle  . Physical activity    Days per week: Not on file    Minutes per session: Not on file  . Stress: Not on file  Relationships  . Social Herbalist on phone: Not on file    Gets together: Not on file    Attends religious service: Not on file    Active member of club or organization: Not on file    Attends  meetings of clubs or organizations: Not on file    Relationship status: Not on file  . Intimate partner violence    Fear of current or ex partner: Not on file    Emotionally abused: Not on file    Physically abused: Not on file    Forced sexual activity: Not on file  Other Topics Concern  . Not on file  Social History Narrative  . Not on file    Outpatient Medications Prior to Visit  Medication Sig Dispense Refill  . amLODipine (NORVASC) 10 MG tablet TAKE 1 TABLET BY MOUTH EVERY DAY 90 tablet 3  . aspirin 81 MG tablet Take 81 mg by mouth at bedtime.     James Burke 1 GM 210 MG(Fe) tablet Take 420 mg by mouth 3 (three) times daily with meals.   0  . b complex-vitamin c-folic acid (NEPHRO-VITE) 0.8 MG TABS tablet Take 1 tablet by mouth daily.  6  . carvedilol (COREG) 25 MG tablet TAKE 1 TABLET TWICE A DAY WITH FOOD 180 tablet 1  . furosemide (LASIX) 80 MG tablet Take 1 tablet (80 mg total) by mouth 2 (two) times daily. 180 tablet 3  . gabapentin (NEURONTIN) 100 MG capsule Take 2 capsules (200 mg total) by mouth 2 (two) times daily as needed (Give one hour prior to hemodialysis session as needed for anxiety/restlessness). 220 capsule 0  . hydrALAZINE (APRESOLINE) 25 MG tablet TAKE 1 TABLET (25 MG TOTAL) 3 (THREE) TIMES DAILY BY MOUTH. 90 tablet 1  . isosorbide mononitrate (IMDUR) 30 MG 24 hr tablet Take 1 tablet (30 mg total) by mouth daily. 90 tablet 3  . lidocaine-prilocaine (EMLA) cream APPLY THREE TIMES A WEEK AS DIRECTED 1 HOUR PRIOR TO DIALYSIS AND WRAP WITH PLASTIC WRAP 30 g 3  . omeprazole (PRILOSEC) 40 MG capsule TAKE 1 CAPSULE BY MOUTH EVERY DAY 90 capsule 1  . oxyCODONE-acetaminophen (PERCOCET) 10-325 MG tablet Take 1 tablet by mouth every 4 (four) hours as needed for pain. 90 tablet 0  . rOPINIRole (REQUIP) 0.25 MG tablet TAKE 1 TO 2 TABLETS AT BEDTIME 180 tablet 1  . tamsulosin (FLOMAX) 0.4 MG CAPS capsule TAKE 2 CAPSULES BY MOUTH DAILY AFTER SUPPER 180 capsule 2  . traZODone  (DESYREL) 50 MG tablet TAKE 1 TABLET AT BEDTIME 90 tablet 0  . TRULICITY 7.62 GB/1.5VV SOPN INJECT 0.75MG EVERY 7 DAYS 6 pen 6  . docusate sodium (COLACE) 100 MG capsule Take 1 capsule (100 mg total) by mouth at bedtime.    James Kitchen  polyethylene glycol powder (MIRALAX) powder Take 17 g by mouth daily as needed for moderate constipation.     No facility-administered medications prior to visit.     Allergies  Allergen Reactions  . Prednisone Other (See Comments)    BLINDNESS > [ ? CSCR ? ]  . Tape     Other reaction(s): Unknown  . Simvastatin Other (See Comments)    MYALGIAS, JOINT PAIN  . Lorazepam Anxiety and Other (See Comments)    Nervousness and "fidgets"; legs "need to kick, "electrical currents"  . Methocarbamol Diarrhea  . Seroquel [Quetiapine Fumarate] Other (See Comments)    QT prolongation    ROS Review of Systems    Objective:    Physical Exam  There were no vitals taken for this visit. Wt Readings from Last 3 Encounters:  12/27/18 210 lb (95.3 kg)  11/21/18 196 lb 10.4 oz (89.2 kg)  08/23/18 205 lb (93 kg)     Health Maintenance Due  Topic Date Due  . COLONOSCOPY  05/16/2011  . OPHTHALMOLOGY EXAM  10/06/2018  . HEMOGLOBIN A1C  02/22/2019    There are no preventive care reminders to display for this patient.  Lab Results  Component Value Date   TSH 2.218 12/30/2016   Lab Results  Component Value Date   WBC 8.6 05/30/2019   HGB 10.6 (L) 05/30/2019   HCT 32.4 (L) 05/30/2019   MCV 91.5 05/30/2019   PLT 239 05/30/2019   Lab Results  Component Value Date   NA 136 11/21/2018   K 4.6 11/21/2018   CO2 24 11/21/2018   GLUCOSE 171 (H) 11/21/2018   BUN 49 (H) 11/21/2018   CREATININE 6.71 (H) 11/21/2018   BILITOT 0.8 11/20/2018   ALKPHOS 83 11/20/2018   AST 15 11/20/2018   ALT 16 11/20/2018   PROT 7.5 11/20/2018   ALBUMIN 3.7 11/20/2018   CALCIUM 8.6 (L) 11/21/2018   ANIONGAP 17 (H) 11/21/2018   EGFR 26.0 04/16/2014   Lab Results  Component Value  Date   CHOL 115 04/06/2018   Lab Results  Component Value Date   HDL 31 (L) 04/06/2018   Lab Results  Component Value Date   LDLCALC 62 04/06/2018   Lab Results  Component Value Date   TRIG 141 04/06/2018   Lab Results  Component Value Date   CHOLHDL 3.7 04/06/2018   Lab Results  Component Value Date   HGBA1C 6.1 (A) 08/23/2018      Assessment & Plan:   Problem List Items Addressed This Visit      Cardiovascular and Mediastinum   Essential hypertension, benign - Primary   Chronic combined systolic and diastolic heart failure ([place])     Endocrine   Type 2 diabetes mellitus with diabetic chronic kidney disease ([place])      No orders of the defined types were placed in this encounter.   Follow-up: No follow-ups on file.    [name], MD

## 2019-07-03 ENCOUNTER — Encounter: Payer: Self-pay | Admitting: Family Medicine

## 2019-07-03 NOTE — Telephone Encounter (Signed)
Appointment has been made. No further questions at this time.  

## 2019-07-09 ENCOUNTER — Encounter: Payer: Self-pay | Admitting: Family Medicine

## 2019-07-09 ENCOUNTER — Other Ambulatory Visit: Payer: Self-pay

## 2019-07-09 ENCOUNTER — Ambulatory Visit (INDEPENDENT_AMBULATORY_CARE_PROVIDER_SITE_OTHER): Payer: BC Managed Care – PPO | Admitting: Family Medicine

## 2019-07-09 VITALS — BP 121/56 | HR 76 | Ht 72.0 in | Wt 214.0 lb

## 2019-07-09 DIAGNOSIS — I5022 Chronic systolic (congestive) heart failure: Secondary | ICD-10-CM

## 2019-07-09 DIAGNOSIS — I10 Essential (primary) hypertension: Secondary | ICD-10-CM

## 2019-07-09 DIAGNOSIS — E1122 Type 2 diabetes mellitus with diabetic chronic kidney disease: Secondary | ICD-10-CM

## 2019-07-09 DIAGNOSIS — Z992 Dependence on renal dialysis: Secondary | ICD-10-CM

## 2019-07-09 DIAGNOSIS — G8929 Other chronic pain: Secondary | ICD-10-CM

## 2019-07-09 DIAGNOSIS — E11610 Type 2 diabetes mellitus with diabetic neuropathic arthropathy: Secondary | ICD-10-CM

## 2019-07-09 DIAGNOSIS — E11319 Type 2 diabetes mellitus with unspecified diabetic retinopathy without macular edema: Secondary | ICD-10-CM

## 2019-07-09 DIAGNOSIS — N186 End stage renal disease: Secondary | ICD-10-CM

## 2019-07-09 LAB — POCT GLYCOSYLATED HEMOGLOBIN (HGB A1C): Hemoglobin A1C: 7.2 % — AB (ref 4.0–5.6)

## 2019-07-09 MED ORDER — ISOSORBIDE MONONITRATE ER 30 MG PO TB24: 30.0000 mg | ORAL_TABLET | Freq: Every day | ORAL | 3 refills | Status: DC

## 2019-07-09 NOTE — Assessment & Plan Note (Signed)
Hemoglobin A1c did go up to 7.1 today discussed making sure that he is consistent with his medications and he has been gaining some weight, above and beyond the ascites and so just encouraged him to get back on track and make sure he is eating healthy and try to get his A1c back under 7.

## 2019-07-09 NOTE — Progress Notes (Addendum)
Established Patient Office Visit  Subjective:  Patient ID: James Burke, male    DOB: [birth year]  Age: 53 y.o. MRN: [id number]  CC:  Chief Complaint  Patient presents with  . Diabetes    HPI SABRI TEAL presents for   Diabetes - no hypoglycemic events. No wounds or sores that are not healing well. No increased thirst or urination. Checking glucose at home. Taking medications as prescribed without any side effects.  He admits he occasionally forgets to take his Trulicity in fact he was supposed to give a shot this week and and says he forgot.  He says sometimes the injection is very painful and other times its not painful at all and he just wonders why.  Hypertension- Pt denies chest pain, SOB, dizziness, or heart palpitations.  Taking meds as directed w/o problems.  Denies medication side effects.    He unfortunately has really been battling abdominal ascites.  He has been told to fluid restrict to about 32 to 40 ounces per day and so he is really been challenged by that.  He has been having to go and have a paracentesis done about every 2 weeks.  He admits he has gained some weight as well.  He notices a big difference in his breathing when he has his paracentesis done.  End-stage renal disease on dialysis-still going to dialysis regularly.  He still has to take medication before dialysis for sedation to help him sleep through it otherwise he gets so anxious it hard for him to actually complete his dialysis session.  Chronic pain management for his knees and Charcot foot.  He has been wearing his boot regularly but says he feels like it makes him more easily fall.  He is really wanting to get a more fitted custom boot but says it will be quite expensive as an insurance will cover it.  He is doing well on his current pain regimen and feels like it is adequate is not having any significant current concerns or side effects.  He is due for urine drug screen today.  BPH-he had tried to  drop down his Flomax completely but was noticing he was having decreased/weakened urinary stream so he did restart and has been taking only 1 tab a day and just wanted to give me an update.   Past Medical History:  Diagnosis Date  . Anemia   . Arthritis, septic, knee ([place])   . C. difficile colitis 04/18/2008  . Congestive heart failure ([place])   . Diabetes mellitus    Type II  . DVT (deep venous thrombosis) (HCC)    right leg   . Dyspnea    "when I have too much fluid.'  . ESRD (end stage renal disease) ([place])    Tues, Thurs  . GERD (gastroesophageal reflux disease)   . History of blood transfusion 2017  . Pneumonia     Past Surgical History:  Procedure Laterality Date  . APPENDECTOMY  1995  . BASCILIC VEIN TRANSPOSITION Left 01/03/2017   Procedure: FIRST STAGE BRACHIAL VEIN TRANSPOSITION;  Surgeon: Conrad Noonan, MD;  Location: [facility];  Service: Vascular;  Laterality: Left;  . BASCILIC VEIN TRANSPOSITION Left 04/24/2017   Procedure: LEFT SECOND STAGE BRACHIAL VEIN TRANSPOSITION;  Surgeon: Conrad Granger, MD;  Location: [facility];  Service: Vascular;  Laterality: Left;  . EYE SURGERY    . INSERTION OF DIALYSIS CATHETER Right 01/03/2017   Procedure: INSERTION OF DIALYSIS CATHETER RIGHT INTERNAL JUGULAR;  Surgeon:  Conrad Forkland, MD;  Location: [facility];  Service: Vascular;  Laterality: Right;  . IR PARACENTESIS  12/14/2017  . IR PARACENTESIS  02/01/2018  . IR PARACENTESIS  03/28/2018  . IR PARACENTESIS  05/17/2018  . IR PARACENTESIS  05/31/2018  . IR PARACENTESIS  06/12/2018  . IR PARACENTESIS  06/26/2018  . IR PARACENTESIS  07/10/2018  . IR PARACENTESIS  07/30/2018  . IR PARACENTESIS  08/30/2018  . IR PARACENTESIS  09/13/2018  . IR PARACENTESIS  09/27/2018  . IR PARACENTESIS  10/11/2018  . IR PARACENTESIS  10/30/2018  . IR PARACENTESIS  11/29/2018  . IR PARACENTESIS  01/22/2019  . IR PARACENTESIS  02/21/2019  . IR PARACENTESIS  03/21/2019  . IR PARACENTESIS  04/02/2019  . IR PARACENTESIS  04/18/2019  .  IR PARACENTESIS  05/02/2019  . IR PARACENTESIS  05/16/2019  . IR PARACENTESIS  05/30/2019  . IR PARACENTESIS  06/14/2019  . IR PARACENTESIS  06/27/2019  . KNEE SURGERY Left 04/2014  . Lazer  Bilateral   . PERICARDIOCENTESIS N/A 01/03/2017   Procedure: Pericardiocentesis;  Surgeon: Burnell Blanks, MD;  Location: [facility];  Service: Cardiovascular;  Laterality: N/A;    Family History  Problem Relation Age of Onset  . Heart disease Other        No family history  . Cancer Brother     Social History   Socioeconomic History  . Marital status: Married    Spouse name: Not on file  . Number of children: 3  . Years of education: Not on file  . Highest education level: Not on file  Occupational History  . Not on file  Social Needs  . Financial resource strain: Not on file  . Food insecurity    Worry: Not on file    Inability: Not on file  . Transportation needs    Medical: Not on file    Non-medical: Not on file  Tobacco Use  . Smoking status: Never Smoker  . Smokeless tobacco: Former Systems developer    Types: Chew  . Tobacco comment: no chewing 01/2017  Substance and Sexual Activity  . Alcohol use: No    Alcohol/week: 0.0 standard drinks  . Drug use: No  . Sexual activity: Yes  Lifestyle  . Physical activity    Days per week: Not on file    Minutes per session: Not on file  . Stress: Not on file  Relationships  . Social Herbalist on phone: Not on file    Gets together: Not on file    Attends religious service: Not on file    Active member of club or organization: Not on file    Attends meetings of clubs or organizations: Not on file    Relationship status: Not on file  . Intimate partner violence    Fear of current or ex partner: Not on file    Emotionally abused: Not on file    Physically abused: Not on file    Forced sexual activity: Not on file  Other Topics Concern  . Not on file  Social History Narrative  . Not on file    Outpatient  Medications Prior to Visit  Medication Sig Dispense Refill  . amLODipine (NORVASC) 10 MG tablet TAKE 1 TABLET BY MOUTH EVERY DAY 90 tablet 3  . aspirin 81 MG tablet Take 81 mg by mouth at bedtime.     Lorin Picket 1 GM 210 MG(Fe) tablet Take 420 mg  by mouth 3 (three) times daily with meals.   0  . b complex-vitamin c-folic acid (NEPHRO-VITE) 0.8 MG TABS tablet Take 1 tablet by mouth daily.  6  . carvedilol (COREG) 25 MG tablet TAKE 1 TABLET TWICE A DAY WITH FOOD 180 tablet 1  . furosemide (LASIX) 80 MG tablet Take 1 tablet (80 mg total) by mouth 2 (two) times daily. 180 tablet 3  . hydrALAZINE (APRESOLINE) 25 MG tablet TAKE 1 TABLET (25 MG TOTAL) 3 (THREE) TIMES DAILY BY MOUTH. 90 tablet 1  . lidocaine-prilocaine (EMLA) cream APPLY THREE TIMES A WEEK AS DIRECTED 1 HOUR PRIOR TO DIALYSIS AND WRAP WITH PLASTIC WRAP 30 g 3  . omeprazole (PRILOSEC) 40 MG capsule TAKE 1 CAPSULE BY MOUTH EVERY DAY 90 capsule 1  . oxyCODONE-acetaminophen (PERCOCET) 10-325 MG tablet Take 1 tablet by mouth every 4 (four) hours as needed for pain. 90 tablet 0  . rOPINIRole (REQUIP) 0.25 MG tablet TAKE 1 TO 2 TABLETS AT BEDTIME 180 tablet 1  . traZODone (DESYREL) 50 MG tablet TAKE 1 TABLET AT BEDTIME 90 tablet 0  . TRULICITY 4.65 KC/1.2XN SOPN INJECT 0.75MG EVERY 7 DAYS 6 pen 6  . hydrOXYzine (ATARAX/VISTARIL) 25 MG tablet Take 25 mg by mouth 2 (two) times daily as needed.    . isosorbide mononitrate (IMDUR) 30 MG 24 hr tablet Take 1 tablet (30 mg total) by mouth daily. 90 tablet 3  . tamsulosin (FLOMAX) 0.4 MG CAPS capsule TAKE 2 CAPSULES BY MOUTH DAILY AFTER SUPPER (Patient taking differently: Take 0.8 mg by mouth daily after supper. TAKE 1 CAPSULE BY MOUTH DAILY AFTER SUPPER) 180 capsule 2  . gabapentin (NEURONTIN) 100 MG capsule Take 2 capsules (200 mg total) by mouth 2 (two) times daily as needed (Give one hour prior to hemodialysis session as needed for anxiety/restlessness). 220 capsule 0   No facility-administered  medications prior to visit.     Allergies  Allergen Reactions  . Prednisone Other (See Comments)    BLINDNESS > [ ? CSCR ? ]  . Tape     Other reaction(s): Unknown  . Simvastatin Other (See Comments)    MYALGIAS, JOINT PAIN  . Lorazepam Anxiety and Other (See Comments)    Nervousness and "fidgets"; legs "need to kick, "electrical currents"  . Methocarbamol Diarrhea  . Seroquel [Quetiapine Fumarate] Other (See Comments)    QT prolongation    ROS Review of Systems    Objective:    Physical Exam  Constitutional: He is oriented to person, place, and time. He appears well-developed and well-nourished.  HENT:  Head: Normocephalic and atraumatic.  Cardiovascular: Normal rate, regular rhythm and normal heart sounds.  Pulmonary/Chest: Effort normal and breath sounds normal.  Neurological: He is alert and oriented to person, place, and time.  Skin: Skin is warm and dry.  Psychiatric: He has a normal mood and affect. His behavior is normal.    BP (!) 121/56   Pulse 76   Ht 6' (1.829 m)   Wt 214 lb (97.1 kg)   SpO2 100%   BMI 29.02 kg/m  Wt Readings from Last 3 Encounters:  07/09/19 214 lb (97.1 kg)  12/27/18 210 lb (95.3 kg)  11/21/18 196 lb 10.4 oz (89.2 kg)     Health Maintenance Due  Topic Date Due  . COLONOSCOPY  05/16/2011  . OPHTHALMOLOGY EXAM  10/06/2018    There are no preventive care reminders to display for this patient.  Lab Results  Component Value Date  TSH 2.218 12/30/2016   Lab Results  Component Value Date   WBC 8.6 05/30/2019   HGB 10.6 (L) 05/30/2019   HCT 32.4 (L) 05/30/2019   MCV 91.5 05/30/2019   PLT 239 05/30/2019   Lab Results  Component Value Date   NA 136 11/21/2018   K 4.6 11/21/2018   CO2 24 11/21/2018   GLUCOSE 171 (H) 11/21/2018   BUN 49 (H) 11/21/2018   CREATININE 6.71 (H) 11/21/2018   BILITOT 0.8 11/20/2018   ALKPHOS 83 11/20/2018   AST 15 11/20/2018   ALT 16 11/20/2018   PROT 7.5 11/20/2018   ALBUMIN 3.7  11/20/2018   CALCIUM 8.6 (L) 11/21/2018   ANIONGAP 17 (H) 11/21/2018   EGFR 26.0 04/16/2014   Lab Results  Component Value Date   CHOL 115 04/06/2018   Lab Results  Component Value Date   HDL 31 (L) 04/06/2018   Lab Results  Component Value Date   LDLCALC 62 04/06/2018   Lab Results  Component Value Date   TRIG 141 04/06/2018   Lab Results  Component Value Date   CHOLHDL 3.7 04/06/2018   Lab Results  Component Value Date   HGBA1C 7.2 (A) 07/09/2019      Assessment & Plan:   Problem List Items Addressed This Visit      Cardiovascular and Mediastinum   Essential hypertension, benign    Blood pressure looks phenomenal.      Relevant Medications   isosorbide mononitrate (IMDUR) 30 MG 24 hr tablet   Chronic systolic CHF (congestive heart failure), NYHA class 1 (HCC)    No sign of volume overload on exam today.      Relevant Medications   isosorbide mononitrate (IMDUR) 30 MG 24 hr tablet     Endocrine   Type 2 diabetes mellitus with diabetic chronic kidney disease ([place]) - Primary    Hemoglobin A1c did go up to 7.1 today discussed making sure that he is consistent with his medications and he has been gaining some weight, above and beyond the ascites and so just encouraged him to get back on track and make sure he is eating healthy and try to get his A1c back under 7.      Relevant Orders   POCT glycosylated hemoglobin (Hb A1C) (Completed)   Diabetic retinopathy associated with type 2 diabetes mellitus ([place])    He is planning on surgery on his left eye in January.  He already had surgery on the right eye.  Did encourage him to have them send [place] the notes.      Charcot foot due to diabetes mellitus [facility])    Planning on getting fitted for a new shoe.  Unfortunately his insurance will not cover it and is got a cost about $700 but he just feels like with his current boot is very unsteady and the soles worn out and he feels like it makes him more susceptible to  falling.      Relevant Orders   Pain Mgmt, Profile 5 w/o med[facility] U     Genitourinary   ESRD (end stage renal disease) on dialysis ([place])    Going to dialysis regularly.        Other   Encounter for chronic pain management    Urine drug screen performed today.  Will refill medications when due.  Oxycodone 10/325 #90 tabs per month.      Relevant Orders   Pain Mgmt, Profile 5 w/o medMatch U  Meds ordered this encounter  Medications  . isosorbide mononitrate (IMDUR) 30 MG 24 hr tablet    Sig: Take 1 tablet (30 mg total) by mouth daily.    Dispense:  90 tablet    Refill:  3  . tamsulosin (FLOMAX) 0.4 MG CAPS capsule    Sig: Take 1 capsule (0.4 mg total) by mouth daily after supper.    Dispense:  90 capsule    Refill:  0    Follow-up: Return in about 3 months (around 10/09/2019) for Diabetes follow-up.    [name], MD

## 2019-07-09 NOTE — Assessment & Plan Note (Signed)
Planning on getting fitted for a new shoe.  Unfortunately his insurance will not cover it and is got a cost about $700 but he just feels like with his current boot is very unsteady and the soles worn out and he feels like it makes him more susceptible to falling.

## 2019-07-09 NOTE — Assessment & Plan Note (Signed)
He is planning on surgery on his left eye in January.  He already had surgery on the right eye.  Did encourage him to have them send [place] the notes.

## 2019-07-09 NOTE — Assessment & Plan Note (Signed)
No sign of volume overload on exam today. °

## 2019-07-09 NOTE — Assessment & Plan Note (Signed)
Blood pressure looks phenomenal.

## 2019-07-09 NOTE — Assessment & Plan Note (Addendum)
Urine drug screen performed today.  Will refill medications when due.  Oxycodone 10/325 #90 tabs per month.

## 2019-07-09 NOTE — Assessment & Plan Note (Signed)
Going to dialysis regularly.

## 2019-07-10 MED ORDER — TAMSULOSIN HCL 0.4 MG PO CAPS: 0.4000 mg | ORAL_CAPSULE | Freq: Every day | ORAL | 0 refills | Status: DC

## 2019-07-10 NOTE — Addendum Note (Signed)
Addended by: [name] D on: 07/10/2019 02:42 PM   Modules accepted: Orders

## 2019-07-11 ENCOUNTER — Ambulatory Visit: Payer: BC Managed Care – PPO | Admitting: Osteopathic Medicine

## 2019-07-11 ENCOUNTER — Other Ambulatory Visit: Payer: Self-pay

## 2019-07-11 ENCOUNTER — Encounter (HOSPITAL_COMMUNITY): Payer: Self-pay | Admitting: Radiology

## 2019-07-11 ENCOUNTER — Ambulatory Visit (HOSPITAL_COMMUNITY)
Admission: RE | Admit: 2019-07-11 | Discharge: 2019-07-11 | Disposition: A | Discharge status: Home / Self Care | Payer: BC Managed Care – PPO | Source: Ambulatory Visit | Attending: Nephrology | Admitting: Nephrology

## 2019-07-11 DIAGNOSIS — R188 Other ascites: Secondary | ICD-10-CM | POA: Diagnosis not present

## 2019-07-11 DIAGNOSIS — I509 Heart failure, unspecified: Secondary | ICD-10-CM

## 2019-07-11 DIAGNOSIS — N186 End stage renal disease: Secondary | ICD-10-CM

## 2019-07-11 HISTORY — PX: IR PARACENTESIS: IMG2679

## 2019-07-11 LAB — PAIN MGMT, PROFILE 5 W/O MEDMATCH U
Amphetamines: NEGATIVE ng/mL
Barbiturates: NEGATIVE ng/mL
Benzodiazepines: NEGATIVE ng/mL
Cocaine Metabolite: NEGATIVE ng/mL
Codeine: NEGATIVE ng/mL
Creatinine: 72.1 mg/dL
Hydrocodone: NEGATIVE ng/mL
Hydromorphone: NEGATIVE ng/mL
Marijuana Metabolite: NEGATIVE ng/mL
Methadone Metabolite: NEGATIVE ng/mL
Morphine: NEGATIVE ng/mL
Norhydrocodone: NEGATIVE ng/mL
Noroxycodone: 379 ng/mL
Opiates: NEGATIVE ng/mL
Oxidant: NEGATIVE ug/mL
Oxycodone: 870 ng/mL
Oxycodone: POSITIVE ng/mL
Oxymorphone: 1581 ng/mL
pH: 6.5 (ref 4.5–9.0)

## 2019-07-11 MED ORDER — LIDOCAINE HCL (PF) 1 % IJ SOLN
INTRAMUSCULAR | Status: DC | PRN
  Administered 2019-07-11: 10 mL

## 2019-07-11 MED ORDER — LIDOCAINE HCL 1 % IJ SOLN
INTRAMUSCULAR | Status: AC
  Filled 2019-07-11: qty 20

## 2019-07-11 NOTE — Procedures (Signed)
Ultrasound-guided  therapeutic paracentesis performed yielding 6.5 liters of yellow  fluid. No immediate complications.EBL none.

## 2019-07-17 ENCOUNTER — Encounter: Payer: Self-pay | Admitting: Family Medicine

## 2019-07-18 MED ORDER — OXYCODONE-ACETAMINOPHEN 10-325 MG PO TABS: 1.0000 | ORAL_TABLET | ORAL | 0 refills | Status: DC | PRN

## 2019-07-22 DIAGNOSIS — R519 Headache, unspecified: Secondary | ICD-10-CM | POA: Insufficient documentation

## 2019-07-24 ENCOUNTER — Other Ambulatory Visit: Payer: Self-pay | Admitting: Nephrology

## 2019-07-24 ENCOUNTER — Encounter: Payer: Self-pay | Admitting: Family Medicine

## 2019-07-24 ENCOUNTER — Other Ambulatory Visit (HOSPITAL_COMMUNITY): Payer: Self-pay | Admitting: Nephrology

## 2019-07-24 DIAGNOSIS — E8770 Fluid overload, unspecified: Secondary | ICD-10-CM

## 2019-07-25 ENCOUNTER — Other Ambulatory Visit: Payer: Self-pay

## 2019-07-25 ENCOUNTER — Ambulatory Visit (HOSPITAL_COMMUNITY)
Admission: RE | Admit: 2019-07-25 | Discharge: 2019-07-25 | Disposition: A | Discharge status: Home / Self Care | Payer: BC Managed Care – PPO | Source: Ambulatory Visit | Attending: Nephrology | Admitting: Nephrology

## 2019-07-25 ENCOUNTER — Encounter (HOSPITAL_COMMUNITY): Payer: Self-pay | Admitting: Physician Assistant

## 2019-07-25 ENCOUNTER — Other Ambulatory Visit: Payer: Self-pay | Admitting: Nephrology

## 2019-07-25 DIAGNOSIS — E8770 Fluid overload, unspecified: Secondary | ICD-10-CM | POA: Insufficient documentation

## 2019-07-25 DIAGNOSIS — R188 Other ascites: Secondary | ICD-10-CM | POA: Diagnosis not present

## 2019-07-25 HISTORY — PX: IR PARACENTESIS: IMG2679

## 2019-07-25 MED ORDER — LIDOCAINE HCL 1 % IJ SOLN
INTRAMUSCULAR | Status: AC
  Filled 2019-07-25: qty 20

## 2019-07-25 MED ORDER — LIDOCAINE HCL 1 % IJ SOLN
INTRAMUSCULAR | Status: DC | PRN
  Administered 2019-07-25: 10 mL

## 2019-07-25 NOTE — Procedures (Signed)
PROCEDURE SUMMARY:  Successful image-guided paracentesis from the right lower abdomen.  Yielded 6.25 liters of light, clear yellow fluid.  No immediate complications.  EBL: zero Patient tolerated well.   Specimen was not sent for labs.  Please see imaging section of Epic for full dictation.  Joaquim Nam PA-C 07/25/2019 1:54 PM

## 2019-07-30 ENCOUNTER — Ambulatory Visit (HOSPITAL_COMMUNITY): Payer: BC Managed Care – PPO

## 2019-08-05 ENCOUNTER — Other Ambulatory Visit: Payer: Self-pay | Admitting: Nephrology

## 2019-08-05 ENCOUNTER — Other Ambulatory Visit (HOSPITAL_COMMUNITY): Payer: Self-pay | Admitting: Nephrology

## 2019-08-05 DIAGNOSIS — E8770 Fluid overload, unspecified: Secondary | ICD-10-CM

## 2019-08-06 ENCOUNTER — Encounter (HOSPITAL_COMMUNITY): Payer: Self-pay | Admitting: Physician Assistant

## 2019-08-06 ENCOUNTER — Other Ambulatory Visit: Payer: Self-pay

## 2019-08-06 ENCOUNTER — Ambulatory Visit (HOSPITAL_COMMUNITY)
Admission: RE | Admit: 2019-08-06 | Discharge: 2019-08-06 | Disposition: A | Discharge status: Home / Self Care | Payer: BC Managed Care – PPO | Source: Ambulatory Visit | Attending: Nephrology | Admitting: Nephrology

## 2019-08-06 DIAGNOSIS — R188 Other ascites: Secondary | ICD-10-CM | POA: Diagnosis not present

## 2019-08-06 DIAGNOSIS — E8770 Fluid overload, unspecified: Secondary | ICD-10-CM | POA: Insufficient documentation

## 2019-08-06 HISTORY — PX: IR PARACENTESIS: IMG2679

## 2019-08-06 MED ORDER — LIDOCAINE HCL (PF) 1 % IJ SOLN
INTRAMUSCULAR | Status: DC | PRN
  Administered 2019-08-06: 10 mL

## 2019-08-06 MED ORDER — LIDOCAINE HCL 1 % IJ SOLN
INTRAMUSCULAR | Status: AC
  Filled 2019-08-06: qty 20

## 2019-08-06 NOTE — Procedures (Signed)
Ultrasound-guided therapeutic paracentesis performed yielding 4.4 liters of straw colored fluid.  No immediate complications. EBL is none.

## 2019-08-15 ENCOUNTER — Ambulatory Visit (HOSPITAL_COMMUNITY)
Admission: RE | Admit: 2019-08-15 | Discharge: 2019-08-15 | Disposition: A | Discharge status: Home / Self Care | Payer: BC Managed Care – PPO | Source: Ambulatory Visit | Attending: Nephrology | Admitting: Nephrology

## 2019-08-15 ENCOUNTER — Other Ambulatory Visit: Payer: Self-pay

## 2019-08-15 DIAGNOSIS — N186 End stage renal disease: Secondary | ICD-10-CM | POA: Insufficient documentation

## 2019-08-15 DIAGNOSIS — I509 Heart failure, unspecified: Secondary | ICD-10-CM | POA: Diagnosis present

## 2019-08-15 DIAGNOSIS — R188 Other ascites: Secondary | ICD-10-CM | POA: Diagnosis not present

## 2019-08-15 HISTORY — PX: IR PARACENTESIS: IMG2679

## 2019-08-15 MED ORDER — LIDOCAINE HCL 1 % IJ SOLN
INTRAMUSCULAR | Status: AC
  Filled 2019-08-15: qty 20

## 2019-08-15 MED ORDER — LIDOCAINE HCL 1 % IJ SOLN
INTRAMUSCULAR | Status: DC | PRN
  Administered 2019-08-15: 10 mL

## 2019-08-16 ENCOUNTER — Other Ambulatory Visit: Payer: Self-pay

## 2019-08-16 DIAGNOSIS — Z20822 Contact with and (suspected) exposure to covid-19: Secondary | ICD-10-CM

## 2019-08-18 LAB — NOVEL CORONAVIRUS, NAA: SARS-CoV-2, NAA: NOT DETECTED

## 2019-08-20 ENCOUNTER — Encounter: Payer: Self-pay | Admitting: Family Medicine

## 2019-08-21 MED ORDER — OXYCODONE-ACETAMINOPHEN 10-325 MG PO TABS: 1.0000 | ORAL_TABLET | ORAL | 0 refills | Status: DC | PRN

## 2019-08-26 ENCOUNTER — Other Ambulatory Visit (HOSPITAL_COMMUNITY): Payer: Self-pay | Admitting: Nephrology

## 2019-08-26 DIAGNOSIS — E8770 Fluid overload, unspecified: Secondary | ICD-10-CM

## 2019-08-29 ENCOUNTER — Ambulatory Visit (HOSPITAL_COMMUNITY)
Admission: RE | Admit: 2019-08-29 | Discharge: 2019-08-29 | Disposition: A | Discharge status: Home / Self Care | Payer: BC Managed Care – PPO | Source: Ambulatory Visit | Attending: Nephrology | Admitting: Nephrology

## 2019-08-29 ENCOUNTER — Other Ambulatory Visit: Payer: Self-pay

## 2019-08-29 DIAGNOSIS — E8770 Fluid overload, unspecified: Secondary | ICD-10-CM

## 2019-08-29 DIAGNOSIS — R188 Other ascites: Secondary | ICD-10-CM | POA: Diagnosis not present

## 2019-08-29 HISTORY — PX: IR PARACENTESIS: IMG2679

## 2019-08-29 MED ORDER — LIDOCAINE HCL 1 % IJ SOLN
INTRAMUSCULAR | Status: AC
  Filled 2019-08-29: qty 20

## 2019-08-29 MED ORDER — LIDOCAINE HCL (PF) 1 % IJ SOLN
INTRAMUSCULAR | Status: DC | PRN
  Administered 2019-08-29: 10 mL

## 2019-08-29 NOTE — Procedures (Signed)
PROCEDURE SUMMARY:  Successful US guided paracentesis from RLQ.  Yielded 5.4 L of clear yellow fluid.  No immediate complications.  Pt tolerated well.   Specimen was not sent for labs.  EBL < 43mL  Ascencion Dike PA-C 08/29/2019 11:52 AM

## 2019-09-01 ENCOUNTER — Other Ambulatory Visit: Payer: Self-pay | Admitting: Family Medicine

## 2019-09-16 ENCOUNTER — Other Ambulatory Visit: Payer: Self-pay | Admitting: Family Medicine

## 2019-09-18 ENCOUNTER — Other Ambulatory Visit (HOSPITAL_COMMUNITY): Payer: Self-pay | Admitting: Nephrology

## 2019-09-18 DIAGNOSIS — E8770 Fluid overload, unspecified: Secondary | ICD-10-CM

## 2019-09-19 ENCOUNTER — Other Ambulatory Visit: Payer: Self-pay

## 2019-09-19 ENCOUNTER — Ambulatory Visit (HOSPITAL_COMMUNITY)
Admission: RE | Admit: 2019-09-19 | Discharge: 2019-09-19 | Disposition: A | Discharge status: Home / Self Care | Payer: BC Managed Care – PPO | Source: Ambulatory Visit | Attending: Nephrology | Admitting: Nephrology

## 2019-09-19 DIAGNOSIS — R188 Other ascites: Secondary | ICD-10-CM | POA: Diagnosis present

## 2019-09-19 DIAGNOSIS — E8770 Fluid overload, unspecified: Secondary | ICD-10-CM

## 2019-09-19 HISTORY — PX: IR PARACENTESIS: IMG2679

## 2019-09-19 MED ORDER — LIDOCAINE HCL 1 % IJ SOLN
INTRAMUSCULAR | Status: AC
  Filled 2019-09-19: qty 20

## 2019-09-20 ENCOUNTER — Encounter: Payer: Self-pay | Admitting: Family Medicine

## 2019-09-20 NOTE — Telephone Encounter (Signed)
Pended. Last OV November 2020. Last RX sent 12/16

## 2019-09-22 ENCOUNTER — Other Ambulatory Visit: Payer: Self-pay | Admitting: Family Medicine

## 2019-09-23 MED ORDER — OXYCODONE-ACETAMINOPHEN 10-325 MG PO TABS: 1.0000 | ORAL_TABLET | ORAL | 0 refills | Status: DC | PRN

## 2019-10-04 ENCOUNTER — Other Ambulatory Visit: Payer: Self-pay

## 2019-10-04 ENCOUNTER — Ambulatory Visit (HOSPITAL_COMMUNITY)
Admission: RE | Admit: 2019-10-04 | Discharge: 2019-10-04 | Disposition: A | Discharge status: Home / Self Care | Payer: BC Managed Care – PPO | Source: Ambulatory Visit | Attending: Nephrology | Admitting: Nephrology

## 2019-10-04 ENCOUNTER — Other Ambulatory Visit (HOSPITAL_COMMUNITY): Payer: Self-pay | Admitting: Nephrology

## 2019-10-04 DIAGNOSIS — R180 Malignant ascites: Secondary | ICD-10-CM

## 2019-10-04 DIAGNOSIS — R188 Other ascites: Secondary | ICD-10-CM | POA: Diagnosis not present

## 2019-10-04 HISTORY — PX: IR PARACENTESIS: IMG2679

## 2019-10-04 MED ORDER — LIDOCAINE HCL (PF) 1 % IJ SOLN
INTRAMUSCULAR | Status: DC | PRN
  Administered 2019-10-04: 10 mL

## 2019-10-04 MED ORDER — LIDOCAINE HCL 1 % IJ SOLN
INTRAMUSCULAR | Status: AC
  Filled 2019-10-04: qty 20

## 2019-10-04 NOTE — Procedures (Signed)
PROCEDURE SUMMARY:  Successful image-guided paracentesis from the right lower abdomen.  Yielded 5.9 liters of clear yellow fluid.  No immediate complications.  EBL: zero Patient tolerated well.   Specimen was not sent for labs.  Please see imaging section of Epic for full dictation.  Joaquim Nam PA-C 10/04/2019 11:06 AM

## 2019-10-08 ENCOUNTER — Ambulatory Visit: Payer: BC Managed Care – PPO | Admitting: Family Medicine

## 2019-10-21 ENCOUNTER — Encounter: Payer: Self-pay | Admitting: Family Medicine

## 2019-10-22 ENCOUNTER — Ambulatory Visit (INDEPENDENT_AMBULATORY_CARE_PROVIDER_SITE_OTHER): Payer: Medicare Other | Admitting: Family Medicine

## 2019-10-22 ENCOUNTER — Encounter: Payer: Self-pay | Admitting: Family Medicine

## 2019-10-22 ENCOUNTER — Other Ambulatory Visit: Payer: Self-pay

## 2019-10-22 VITALS — BP 117/52 | HR 79 | Ht 72.0 in | Wt 216.0 lb

## 2019-10-22 DIAGNOSIS — G8929 Other chronic pain: Secondary | ICD-10-CM | POA: Diagnosis not present

## 2019-10-22 DIAGNOSIS — Z992 Dependence on renal dialysis: Secondary | ICD-10-CM

## 2019-10-22 DIAGNOSIS — I5042 Chronic combined systolic (congestive) and diastolic (congestive) heart failure: Secondary | ICD-10-CM | POA: Diagnosis not present

## 2019-10-22 DIAGNOSIS — I10 Essential (primary) hypertension: Secondary | ICD-10-CM | POA: Diagnosis not present

## 2019-10-22 DIAGNOSIS — Z794 Long term (current) use of insulin: Secondary | ICD-10-CM

## 2019-10-22 DIAGNOSIS — N186 End stage renal disease: Secondary | ICD-10-CM

## 2019-10-22 DIAGNOSIS — E1122 Type 2 diabetes mellitus with diabetic chronic kidney disease: Secondary | ICD-10-CM

## 2019-10-22 DIAGNOSIS — E11610 Type 2 diabetes mellitus with diabetic neuropathic arthropathy: Secondary | ICD-10-CM

## 2019-10-22 LAB — POCT GLYCOSYLATED HEMOGLOBIN (HGB A1C): Hemoglobin A1C: 7.5 % — AB (ref 4.0–5.6)

## 2019-10-22 MED ORDER — OXYCODONE-ACETAMINOPHEN 10-325 MG PO TABS: 1.0000 | ORAL_TABLET | ORAL | 0 refills | Status: DC | PRN

## 2019-10-22 NOTE — Assessment & Plan Note (Signed)
Refill oxycodone today.  Urine drug screen performed.  PDMP reviewed.   Indication for chronic opioid: Chronic back pain and Charcot foot. Medication and dose: Percocet 10/325 # pills per month: 90 Last UDS date: 10/22/2019  Opioid Treatment Agreement signed (Y/N): Y Opioid Treatment Agreement last reviewed with patient:   NCCSRS reviewed this encounter (include red flags):  Yes

## 2019-10-22 NOTE — Assessment & Plan Note (Signed)
Custom orthotic boot in place but he still like to consider getting a custom fitted work boot so that he can do more yard work in the summer without having to worry about tripping/falling

## 2019-10-22 NOTE — Assessment & Plan Note (Signed)
BP looks awesome today.

## 2019-10-22 NOTE — Assessment & Plan Note (Signed)
Uncontrolled.  A1c went up.  He admits that he has not been doing his Trulicity consistently and sometimes will skip entire weeks.  He knows he needs to be more diligent and says he is committed to doing so.  Just also really encouraged him to continue to work on diet and portion control.  He really needs to lose about 5 to 10 pounds.  I had like to see him down about 3 pounds when I see him back

## 2019-10-22 NOTE — Patient Instructions (Signed)
Try to get your shot in very consistently

## 2019-10-22 NOTE — Progress Notes (Signed)
Established Patient Office Visit  Subjective:  Patient ID: James Burke, male    DOB: Sep 08, 1965  Age: 54 y.o. MRN: [id number]  CC:  Chief Complaint  Patient presents with  . Diabetes  . Hypertension    HPI AVONTE SENSABAUGH presents for   Diabetes - no hypoglycemic events. No wounds or sores that are not healing well. No increased thirst or urination. Checking glucose at home. Taking medications as prescribed without any side effects.  He admits he has not been nearly as active this winter.  Normally he gets out in the yard but just has not been outside and active and exercising.  Hypertension- Pt denies chest pain, SOB, dizziness, or heart palpitations.  Taking meds as directed w/o problems.  Denies medication side effects.     Follow-up combined systolic/diastolic heart failure- no swelling in the extremities.   Chronic pain management for his knees and Charcot foot.  He has been wearing his boot regularly but says he feels like it makes him more easily fall.  He is really wanting to get a more fitted custom boot but says it will be quite expensive as an insurance will cover it.  He is doing well on his current pain regimen and feels like it is adequate is not having any significant current concerns or side effects.  He is due for urine drug screen today.  Past Medical History:  Diagnosis Date  . Anemia   . 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 C. difficile colitis 04/18/2008  . Congestive heart failure ([place])   . Diabetes mellitus    Type II  . DVT (deep venous thrombosis) (H[facility])    right leg   . Dyspnea    "when I have too much fluid.'  . ESRD (end stage renal disease) ([place])    Tues, Thurs  . GERD (gastroesophageal reflux disease)   . History of blood transfusion 2017  . Pneumonia     Past Surgical History:  Procedure Laterality Date  . APPENDECTOMY  1995  . BASCILIC VEIN TRANSPOSITION Left 01/03/2017   Procedure: FIRST STAGE BRACHIAL VEIN TRANSPOSITION;  Surgeon: Conrad Maywood, MD;  Location: [facility];  Service: Vascular;  Laterality: Left;  . BASCILIC VEIN TRANSPOSITION Left 04/24/2017   Procedure: LEFT SECOND STAGE BRACHIAL VEIN TRANSPOSITION;  Surgeon: Conrad [place], MD;  Location: [facility];  Service: Vascular;  Laterality: Left;  . EYE SURGERY    . INSERTION OF DIALYSIS CATHETER Right 01/03/2017   Procedure: INSERTION OF DIALYSIS CATHETER RIGHT INTERNAL JUGULAR;  Surgeon: Conrad [place], MD;  Location: [facility];  Service: Vascular;  Laterality: Right;  . IR PARACENTESIS  12/14/2017  . IR PARACENTESIS  02/01/2018  . IR PARACENTESIS  03/28/2018  . IR PARACENTESIS  05/17/2018  . IR PARACENTESIS  05/31/2018  . IR PARACENTESIS  06/12/2018  . IR PARACENTESIS  06/26/2018  . IR PARACENTESIS  07/10/2018  . IR PARACENTESIS  07/30/2018  . IR PARACENTESIS  08/30/2018  . IR PARACENTESIS  09/13/2018  . IR PARACENTESIS  09/27/2018  . IR PARACENTESIS  10/11/2018  . IR PARACENTESIS  10/30/2018  . IR PARACENTESIS  11/29/2018  . IR PARACENTESIS  01/22/2019  . IR PARACENTESIS  02/21/2019  . IR PARACENTESIS  03/21/2019  . IR PARACENTESIS  04/02/2019  . IR PARACENTESIS  04/18/2019  . IR PARACENTESIS  05/02/2019  . IR PARACENTESIS  05/16/2019  . IR PARACENTESIS  05/30/2019  . IR PARACENTESIS  06/14/2019  .  IR PARACENTESIS  06/27/2019  . IR PARACENTESIS  07/11/2019  . IR PARACENTESIS  07/25/2019  . IR PARACENTESIS  08/06/2019  . IR PARACENTESIS  08/15/2019  . IR PARACENTESIS  08/29/2019  . IR PARACENTESIS  09/19/2019  . IR PARACENTESIS  10/04/2019  . KNEE SURGERY Left 04/2014  . [place]  Bilateral   . PERICARDIOCENTESIS N/A 01/03/2017   Procedure: Pericardiocentesis;  Surgeon: Burnell Blanks, MD;  Location: [facility];  Service: Cardiovascular;  Laterality: N/A;    Family History  Problem Relation Age of Onset  . Heart disease Other        No family history  . Cancer Brother     Social History   Socioeconomic History  . Marital status: Married    Spouse name: Not on file  .  Number of children: 3  . Years of education: Not on file  . Highest education level: Not on file  Occupational History  . Not on file  Tobacco Use  . Smoking status: Never Smoker  . Smokeless tobacco: Former Systems developer    Types: Chew  . Tobacco comment: no chewing 01/2017  Substance and Sexual Activity  . Alcohol use: No    Alcohol/week: 0.0 standard drinks  . Drug use: No  . Sexual activity: Yes  Other Topics Concern  . Not on file  Social History Narrative  . Not on file   Social Determinants of Health   Financial Resource Strain:   . Difficulty of Paying Living Expenses: Not on file  Food Insecurity:   . Worried About Charity fundraiser in the Last Year: Not on file  . Ran Out of Food in the Last Year: Not on file  Transportation Needs:   . Lack of Transportation (Medical): Not on file  . Lack of Transportation (Non-Medical): Not on file  Physical Activity:   . Days of Exercise per Week: Not on file  . Minutes of Exercise per Session: Not on file  Stress:   . Feeling of Stress : Not on file  Social Connections:   . Frequency of Communication with Friends and Family: Not on file  . Frequency of Social Gatherings with Friends and Family: Not on file  . Attends Religious Services: Not on file  . Active Member of Clubs or Organizations: Not on file  . Attends Archivist Meetings: Not on file  . Marital Status: Not on file  Intimate Partner Violence:   . Fear of Current or Ex-Partner: Not on file  . Emotionally Abused: Not on file  . Physically Abused: Not on file  . Sexually Abused: Not on file    Outpatient Medications Prior to Visit  Medication Sig Dispense Refill  . amLODipine (NORVASC) 10 MG tablet TAKE 1 TABLET BY MOUTH EVERY DAY 90 tablet 3  . aspirin 81 MG tablet Take 81 mg by mouth at bedtime.     Lorin Picket 1 GM 210 MG(Fe) tablet Take 420 mg by mouth 3 (three) times daily with meals.   0  . b complex-vitamin c-folic acid (NEPHRO-VITE) 0.8 MG TABS  tablet Take 1 tablet by mouth daily.  6  . carvedilol (COREG) 25 MG tablet TAKE 1 TABLET TWICE A DAY WITH FOOD 180 tablet 1  . furosemide (LASIX) 80 MG tablet TAKE 1 TABLET (80 MG TOTAL) BY MOUTH 2 (TWO) TIMES DAILY. 180 tablet 3  . hydrALAZINE (APRESOLINE) 25 MG tablet TAKE 1 TABLET (25 MG TOTAL) 3 (THREE) TIMES DAILY BY  MOUTH. 90 tablet 1  . isosorbide mononitrate (IMDUR) 30 MG 24 hr tablet Take 1 tablet (30 mg total) by mouth daily. 90 tablet 3  . lidocaine-prilocaine (EMLA) cream APPLY THREE TIMES A WEEK AS DIRECTED 1 HOUR PRIOR TO DIALYSIS AND WRAP WITH PLASTIC WRAP 30 g 3  . omeprazole (PRILOSEC) 40 MG capsule TAKE 1 CAPSULE BY MOUTH EVERY DAY 90 capsule 1  . rOPINIRole (REQUIP) 0.25 MG tablet TAKE 1 TO 2 TABLETS AT BEDTIME 180 tablet 1  . tamsulosin (FLOMAX) 0.4 MG CAPS capsule TAKE 2 CAPSULES BY MOUTH DAILY AFTER SUPPER 180 capsule 2  . traZODone (DESYREL) 50 MG tablet TAKE 1 TABLET AT BEDTIME 90 tablet 0  . TRULICITY 9.52 WU/1.3KG SOPN INJECT 0.75MG EVERY 7 DAYS 6 pen 6  . oxyCODONE-acetaminophen (PERCOCET) 10-325 MG tablet Take 1 tablet by mouth every 4 (four) hours as needed for pain. 90 tablet 0  . gabapentin (NEURONTIN) 100 MG capsule Take 2 capsules (200 mg total) by mouth 2 (two) times daily as needed (Give one hour prior to hemodialysis session as needed for anxiety/restlessness). 220 capsule 0   No facility-administered medications prior to visit.    Allergies  Allergen Reactions  . Prednisone Other (See Comments)    BLINDNESS > [ ? CSCR ? ]  . Tape     Other reaction(s): Unknown  . Simvastatin Other (See Comments)    MYALGIAS, JOINT PAIN  . Lorazepam Anxiety and Other (See Comments)    Nervousness and "fidgets"; legs "need to kick, "electrical currents"  . Methocarbamol Diarrhea  . Seroquel [Quetiapine Fumarate] Other (See Comments)    QT prolongation    ROS Review of Systems    Objective:    Physical Exam  Constitutional: He is oriented to person, place, and  time. He appears well-developed and well-nourished.  HENT:  Head: Normocephalic and atraumatic.  Cardiovascular: Normal rate, regular rhythm and normal heart sounds.  Pulmonary/Chest: Effort normal and breath sounds normal.  Neurological: He is alert and oriented to person, place, and time.  Skin: Skin is warm and dry.  Psychiatric: He has a normal mood and affect. His behavior is normal.    BP (!) 117/52   Pulse 79   Ht 6' (1.829 m)   Wt 216 lb (98 kg)   SpO2 100%   BMI 29.29 kg/m  Wt Readings from Last 3 Encounters:  10/22/19 216 lb (98 kg)  07/09/19 214 lb (97.1 kg)  12/27/18 210 lb (95.3 kg)     Health Maintenance Due  Topic Date Due  . COLONOSCOPY  05/16/2011  . OPHTHALMOLOGY EXAM  10/06/2018    There are no preventive care reminders to display for this patient.  Lab Results  Component Value Date   TSH 2.218 12/30/2016   Lab Results  Component Value Date   WBC 8.6 05/30/2019   HGB 10.6 (L) 05/30/2019   HCT 32.4 (L) 05/30/2019   MCV 91.5 05/30/2019   PLT 239 05/30/2019   Lab Results  Component Value Date   NA 136 11/21/2018   K 4.6 11/21/2018   CO2 24 11/21/2018   GLUCOSE 171 (H) 11/21/2018   BUN 49 (H) 11/21/2018   CREATININE 6.71 (H) 11/21/2018   BILITOT 0.8 11/20/2018   ALKPHOS 83 11/20/2018   AST 15 11/20/2018   ALT 16 11/20/2018   PROT 7.5 11/20/2018   ALBUMIN 3.7 11/20/2018   CALCIUM 8.6 (L) 11/21/2018   ANIONGAP 17 (H) 11/21/2018   EGFR 26.0 04/16/2014  Lab Results  Component Value Date   CHOL 115 04/06/2018   Lab Results  Component Value Date   HDL 31 (L) 04/06/2018   Lab Results  Component Value Date   LDLCALC 62 04/06/2018   Lab Results  Component Value Date   TRIG 141 04/06/2018   Lab Results  Component Value Date   CHOLHDL 3.7 04/06/2018   Lab Results  Component Value Date   HGBA1C 7.5 (A) 10/22/2019      Assessment & Plan:   Problem List Items Addressed This Visit      Cardiovascular and Mediastinum    Essential hypertension, benign    BP looks awesome today.      Chronic combined systolic and diastolic heart failure (HCC)     Endocrine   Type 2 diabetes mellitus with diabetic chronic kidney disease ([place]) - Primary    Uncontrolled.  A1c went up.  He admits that he has not been doing his Trulicity consistently and sometimes will skip entire weeks.  He knows he needs to be more diligent and says he is committed to doing so.  Just also really encouraged him to continue to work on diet and portion control.  He really needs to lose about 5 to 10 pounds.  I had like to see him down about 3 pounds when I see him back      Relevant Orders   POCT glycosylated hemoglobin (Hb A1C) (Completed)   Controlled type 2 diabetes mellitus with renal manifestation (HCC)   Charcot foot due to diabetes mellitus ([place])    Custom orthotic boot in place but he still like to consider getting a custom fitted work boot so that he can do more yard work in the summer without having to worry about tripping/falling      Relevant Medications   oxyCODONE-acetaminophen (PERCOCET) 10-325 MG tablet   Other Relevant Orders   Pain Mgmt, Profile 5 w/o medMatch U     Other   Encounter for chronic pain management    Refill oxycodone today.  Urine drug screen performed.  PDMP reviewed.   Indication for chronic opioid: Chronic back pain and Charcot foot. Medication and dose: Percocet 10/325 # pills per month: 90 Last UDS date: 10/22/2019  Opioid Treatment Agreement signed (Y/N): Y Opioid Treatment Agreement last reviewed with patient:   NCCSRS reviewed this encounter (include red flags):  Yes       Relevant Orders   Pain Mgmt, Profile 5 w/o medMatch U      Meds ordered this encounter  Medications  . oxyCODONE-acetaminophen (PERCOCET) 10-325 MG tablet    Sig: Take 1 tablet by mouth every 4 (four) hours as needed for pain.    Dispense:  90 tablet    Refill:  0    Follow-up: Return in about 3 months (around  01/19/2020) for Diabetes follow-up.    [name], MD

## 2019-10-24 LAB — PAIN MGMT, PROFILE 5 W/O MEDMATCH U
Amphetamines: NEGATIVE ng/mL
Barbiturates: NEGATIVE ng/mL
Benzodiazepines: NEGATIVE ng/mL
Cocaine Metabolite: NEGATIVE ng/mL
Codeine: NEGATIVE ng/mL
Creatinine: 117.9 mg/dL
Hydrocodone: NEGATIVE ng/mL
Hydromorphone: NEGATIVE ng/mL
Marijuana Metabolite: NEGATIVE ng/mL
Methadone Metabolite: NEGATIVE ng/mL
Morphine: NEGATIVE ng/mL
Norhydrocodone: NEGATIVE ng/mL
Noroxycodone: 994 ng/mL
Opiates: NEGATIVE ng/mL
Oxidant: NEGATIVE ug/mL
Oxycodone: 2695 ng/mL
Oxycodone: POSITIVE ng/mL
Oxymorphone: 1689 ng/mL
pH: 7.3 (ref 4.5–9.0)

## 2019-10-25 ENCOUNTER — Other Ambulatory Visit (HOSPITAL_COMMUNITY): Payer: Self-pay | Admitting: Physician Assistant

## 2019-10-25 ENCOUNTER — Other Ambulatory Visit: Payer: Self-pay | Admitting: Chiropractic Medicine

## 2019-10-25 ENCOUNTER — Other Ambulatory Visit: Payer: Self-pay | Admitting: Physician Assistant

## 2019-10-25 DIAGNOSIS — R188 Other ascites: Secondary | ICD-10-CM

## 2019-10-27 ENCOUNTER — Emergency Department (HOSPITAL_COMMUNITY): Payer: Medicare Other

## 2019-10-27 ENCOUNTER — Other Ambulatory Visit: Payer: Self-pay

## 2019-10-27 ENCOUNTER — Inpatient Hospital Stay (HOSPITAL_COMMUNITY)
Admission: EM | Admit: 2019-10-27 | Discharge: 2019-10-29 | DRG: 640 | Disposition: A | Discharge status: Home / Self Care | Payer: Medicare Other | Attending: Internal Medicine | Admitting: Internal Medicine

## 2019-10-27 ENCOUNTER — Encounter (HOSPITAL_COMMUNITY): Payer: Self-pay | Admitting: *Deleted

## 2019-10-27 DIAGNOSIS — N186 End stage renal disease: Secondary | ICD-10-CM | POA: Diagnosis not present

## 2019-10-27 DIAGNOSIS — I5022 Chronic systolic (congestive) heart failure: Secondary | ICD-10-CM

## 2019-10-27 DIAGNOSIS — Z888 Allergy status to other drugs, medicaments and biological substances status: Secondary | ICD-10-CM

## 2019-10-27 DIAGNOSIS — M25562 Pain in left knee: Secondary | ICD-10-CM | POA: Diagnosis present

## 2019-10-27 DIAGNOSIS — E1122 Type 2 diabetes mellitus with diabetic chronic kidney disease: Secondary | ICD-10-CM | POA: Diagnosis present

## 2019-10-27 DIAGNOSIS — Z87891 Personal history of nicotine dependence: Secondary | ICD-10-CM

## 2019-10-27 DIAGNOSIS — I428 Other cardiomyopathies: Secondary | ICD-10-CM | POA: Diagnosis present

## 2019-10-27 DIAGNOSIS — S0003XA Contusion of scalp, initial encounter: Secondary | ICD-10-CM | POA: Diagnosis present

## 2019-10-27 DIAGNOSIS — R188 Other ascites: Secondary | ICD-10-CM | POA: Diagnosis not present

## 2019-10-27 DIAGNOSIS — Z20822 Contact with and (suspected) exposure to covid-19: Secondary | ICD-10-CM | POA: Diagnosis present

## 2019-10-27 DIAGNOSIS — E11610 Type 2 diabetes mellitus with diabetic neuropathic arthropathy: Secondary | ICD-10-CM | POA: Diagnosis present

## 2019-10-27 DIAGNOSIS — D631 Anemia in chronic kidney disease: Secondary | ICD-10-CM | POA: Diagnosis present

## 2019-10-27 DIAGNOSIS — I132 Hypertensive heart and chronic kidney disease with heart failure and with stage 5 chronic kidney disease, or end stage renal disease: Secondary | ICD-10-CM | POA: Diagnosis present

## 2019-10-27 DIAGNOSIS — M25561 Pain in right knee: Secondary | ICD-10-CM | POA: Diagnosis present

## 2019-10-27 DIAGNOSIS — Z7982 Long term (current) use of aspirin: Secondary | ICD-10-CM

## 2019-10-27 DIAGNOSIS — F419 Anxiety disorder, unspecified: Secondary | ICD-10-CM | POA: Diagnosis present

## 2019-10-27 DIAGNOSIS — Z7984 Long term (current) use of oral hypoglycemic drugs: Secondary | ICD-10-CM

## 2019-10-27 DIAGNOSIS — I472 Ventricular tachycardia: Secondary | ICD-10-CM | POA: Diagnosis present

## 2019-10-27 DIAGNOSIS — Z992 Dependence on renal dialysis: Secondary | ICD-10-CM

## 2019-10-27 DIAGNOSIS — I071 Rheumatic tricuspid insufficiency: Secondary | ICD-10-CM | POA: Diagnosis present

## 2019-10-27 DIAGNOSIS — Z79899 Other long term (current) drug therapy: Secondary | ICD-10-CM

## 2019-10-27 DIAGNOSIS — G8929 Other chronic pain: Secondary | ICD-10-CM | POA: Diagnosis present

## 2019-10-27 DIAGNOSIS — N2581 Secondary hyperparathyroidism of renal origin: Secondary | ICD-10-CM | POA: Diagnosis present

## 2019-10-27 DIAGNOSIS — K219 Gastro-esophageal reflux disease without esophagitis: Secondary | ICD-10-CM | POA: Diagnosis present

## 2019-10-27 DIAGNOSIS — E11319 Type 2 diabetes mellitus with unspecified diabetic retinopathy without macular edema: Secondary | ICD-10-CM | POA: Diagnosis present

## 2019-10-27 DIAGNOSIS — W19XXXA Unspecified fall, initial encounter: Secondary | ICD-10-CM | POA: Diagnosis present

## 2019-10-27 DIAGNOSIS — E875 Hyperkalemia: Principal | ICD-10-CM | POA: Diagnosis present

## 2019-10-27 DIAGNOSIS — Z91048 Other nonmedicinal substance allergy status: Secondary | ICD-10-CM

## 2019-10-27 DIAGNOSIS — M278 Other specified diseases of jaws: Secondary | ICD-10-CM | POA: Diagnosis present

## 2019-10-27 DIAGNOSIS — Z79891 Long term (current) use of opiate analgesic: Secondary | ICD-10-CM

## 2019-10-27 DIAGNOSIS — Z86718 Personal history of other venous thrombosis and embolism: Secondary | ICD-10-CM

## 2019-10-27 DIAGNOSIS — R001 Bradycardia, unspecified: Secondary | ICD-10-CM | POA: Diagnosis present

## 2019-10-27 DIAGNOSIS — I5042 Chronic combined systolic (congestive) and diastolic (congestive) heart failure: Secondary | ICD-10-CM | POA: Diagnosis present

## 2019-10-27 DIAGNOSIS — I10 Essential (primary) hypertension: Secondary | ICD-10-CM

## 2019-10-27 LAB — CBC WITH DIFFERENTIAL/PLATELET
Abs Immature Granulocytes: 0.14 10*3/uL — ABNORMAL HIGH (ref 0.00–0.07)
Basophils Absolute: 0.1 10*3/uL (ref 0.0–0.1)
Basophils Relative: 1 %
Eosinophils Absolute: 0.3 10*3/uL (ref 0.0–0.5)
Eosinophils Relative: 3 %
HCT: 32.9 % — ABNORMAL LOW (ref 39.0–52.0)
Hemoglobin: 10.2 g/dL — ABNORMAL LOW (ref 13.0–17.0)
Immature Granulocytes: 2 %
Lymphocytes Relative: 10 %
Lymphs Abs: 0.9 10*3/uL (ref 0.7–4.0)
MCH: 28.7 pg (ref 26.0–34.0)
MCHC: 31 g/dL (ref 30.0–36.0)
MCV: 92.4 fL (ref 80.0–100.0)
Monocytes Absolute: 0.8 10*3/uL (ref 0.1–1.0)
Monocytes Relative: 9 %
Neutro Abs: 6.8 10*3/uL (ref 1.7–7.7)
Neutrophils Relative %: 75 %
Platelets: 231 10*3/uL (ref 150–400)
RBC: 3.56 MIL/uL — ABNORMAL LOW (ref 4.22–5.81)
RDW: 14.5 % (ref 11.5–15.5)
WBC: 9 10*3/uL (ref 4.0–10.5)
nRBC: 0.2 % (ref 0.0–0.2)

## 2019-10-27 LAB — I-STAT CHEM 8, ED
BUN: 93 mg/dL — ABNORMAL HIGH (ref 6–20)
Calcium, Ion: 1.03 mmol/L — ABNORMAL LOW (ref 1.15–1.40)
Chloride: 101 mmol/L (ref 98–111)
Creatinine, Ser: 12.1 mg/dL — ABNORMAL HIGH (ref 0.61–1.24)
Glucose, Bld: 145 mg/dL — ABNORMAL HIGH (ref 70–99)
HCT: 31 % — ABNORMAL LOW (ref 39.0–52.0)
Hemoglobin: 10.5 g/dL — ABNORMAL LOW (ref 13.0–17.0)
Potassium: 6.8 mmol/L (ref 3.5–5.1)
Sodium: 134 mmol/L — ABNORMAL LOW (ref 135–145)
TCO2: 23 mmol/L (ref 22–32)

## 2019-10-27 LAB — POCT I-STAT EG7
Acid-base deficit: 5 mmol/L — ABNORMAL HIGH (ref 0.0–2.0)
Bicarbonate: 21.9 mmol/L (ref 20.0–28.0)
Calcium, Ion: 1.05 mmol/L — ABNORMAL LOW (ref 1.15–1.40)
HCT: 32 % — ABNORMAL LOW (ref 39.0–52.0)
Hemoglobin: 10.9 g/dL — ABNORMAL LOW (ref 13.0–17.0)
O2 Saturation: 69 %
Potassium: 6.8 mmol/L (ref 3.5–5.1)
Sodium: 135 mmol/L (ref 135–145)
TCO2: 23 mmol/L (ref 22–32)
pCO2, Ven: 47.6 mmHg (ref 44.0–60.0)
pH, Ven: 7.271 (ref 7.250–7.430)
pO2, Ven: 41 mmHg (ref 32.0–45.0)

## 2019-10-27 LAB — COMPREHENSIVE METABOLIC PANEL
ALT: 17 U/L (ref 0–44)
AST: 18 U/L (ref 15–41)
Albumin: 3.6 g/dL (ref 3.5–5.0)
Alkaline Phosphatase: 69 U/L (ref 38–126)
Anion gap: 19 — ABNORMAL HIGH (ref 5–15)
BUN: 84 mg/dL — ABNORMAL HIGH (ref 6–20)
CO2: 21 mmol/L — ABNORMAL LOW (ref 22–32)
Calcium: 8.8 mg/dL — ABNORMAL LOW (ref 8.9–10.3)
Chloride: 94 mmol/L — ABNORMAL LOW (ref 98–111)
Creatinine, Ser: 11.21 mg/dL — ABNORMAL HIGH (ref 0.61–1.24)
GFR calc Af Amer: 5 mL/min — ABNORMAL LOW (ref >60)
GFR calc non Af Amer: 5 mL/min — ABNORMAL LOW (ref >60)
Glucose, Bld: 155 mg/dL — ABNORMAL HIGH (ref 70–99)
Potassium: 6.9 mmol/L (ref 3.5–5.1)
Sodium: 134 mmol/L — ABNORMAL LOW (ref 135–145)
Total Bilirubin: 0.8 mg/dL (ref 0.3–1.2)
Total Protein: 7.6 g/dL (ref 6.5–8.1)

## 2019-10-27 LAB — TROPONIN I (HIGH SENSITIVITY): Troponin I (High Sensitivity): 19 ng/L — ABNORMAL HIGH (ref <18)

## 2019-10-27 LAB — MAGNESIUM: Magnesium: 2.5 mg/dL — ABNORMAL HIGH (ref 1.7–2.4)

## 2019-10-27 LAB — BRAIN NATRIURETIC PEPTIDE: B Natriuretic Peptide: 1132.3 pg/mL — ABNORMAL HIGH (ref 0.0–100.0)

## 2019-10-27 LAB — LACTIC ACID, PLASMA: Lactic Acid, Venous: 1.9 mmol/L (ref 0.5–1.9)

## 2019-10-27 LAB — CBG MONITORING, ED: Glucose-Capillary: 99 mg/dL (ref 70–99)

## 2019-10-27 LAB — TSH: TSH: 4.26 u[IU]/mL (ref 0.350–4.500)

## 2019-10-27 MED ORDER — ROPINIROLE HCL 0.5 MG PO TABS: 0.5000 mg | ORAL_TABLET | ORAL | Status: DC

## 2019-10-27 MED ORDER — FERRIC CITRATE 1 GM 210 MG(FE) PO TABS: 210.0000 mg | ORAL_TABLET | ORAL | Status: DC

## 2019-10-27 MED ORDER — GABAPENTIN 100 MG PO CAPS
200.0000 mg | ORAL_CAPSULE | Freq: Once | ORAL | Status: AC
  Administered 2019-10-28: 200 mg via ORAL
  Filled 2019-10-27: qty 2

## 2019-10-27 MED ORDER — HEPARIN SODIUM (PORCINE) 5000 UNIT/ML IJ SOLN: 5000.0000 [IU] | Freq: Three times a day (TID) | INTRAMUSCULAR | Status: DC

## 2019-10-27 MED ORDER — ACETAMINOPHEN 325 MG PO TABS
650.0000 mg | ORAL_TABLET | Freq: Four times a day (QID) | ORAL | Status: DC | PRN
  Administered 2019-10-28: 650 mg via ORAL
  Filled 2019-10-27: qty 2

## 2019-10-27 MED ORDER — DIPHENHYDRAMINE HCL 25 MG PO CAPS
25.0000 mg | ORAL_CAPSULE | Freq: Every day | ORAL | Status: DC
  Administered 2019-10-28: 25 mg via ORAL
  Filled 2019-10-27: qty 1

## 2019-10-27 MED ORDER — ACETAMINOPHEN 650 MG RE SUPP: 650.0000 mg | Freq: Four times a day (QID) | RECTAL | Status: DC | PRN

## 2019-10-27 MED ORDER — ROPINIROLE HCL 0.25 MG PO TABS
0.2500 mg | ORAL_TABLET | Freq: Once | ORAL | Status: AC
  Administered 2019-10-28: 0.25 mg via ORAL
  Filled 2019-10-27: qty 1

## 2019-10-27 MED ORDER — OXYCODONE HCL 5 MG PO TABS
5.0000 mg | ORAL_TABLET | Freq: Every day | ORAL | Status: DC
  Administered 2019-10-28: 5 mg via ORAL
  Filled 2019-10-27: qty 1

## 2019-10-27 MED ORDER — OXYCODONE HCL 5 MG PO TABS
5.0000 mg | ORAL_TABLET | Freq: Every day | ORAL | Status: DC | PRN
  Administered 2019-10-27: 5 mg via ORAL
  Filled 2019-10-27: qty 1

## 2019-10-27 MED ORDER — SODIUM BICARBONATE 8.4 % IV SOLN
50.0000 meq | Freq: Once | INTRAVENOUS | Status: AC
  Administered 2019-10-27: 50 meq via INTRAVENOUS
  Filled 2019-10-27: qty 50

## 2019-10-27 MED ORDER — OXYCODONE-ACETAMINOPHEN 5-325 MG PO TABS
1.0000 | ORAL_TABLET | Freq: Every day | ORAL | Status: DC | PRN
  Administered 2019-10-27: 1 via ORAL
  Filled 2019-10-27: qty 1

## 2019-10-27 MED ORDER — INSULIN ASPART 100 UNIT/ML IV SOLN
5.0000 [IU] | Freq: Once | INTRAVENOUS | Status: AC
  Administered 2019-10-27: 5 [IU] via INTRAVENOUS

## 2019-10-27 MED ORDER — DIPHENHYDRAMINE HCL 25 MG PO CAPS
25.0000 mg | ORAL_CAPSULE | Freq: Once | ORAL | Status: AC
  Administered 2019-10-28: 25 mg via ORAL
  Filled 2019-10-27: qty 1

## 2019-10-27 MED ORDER — PANTOPRAZOLE SODIUM 40 MG PO TBEC
80.0000 mg | DELAYED_RELEASE_TABLET | Freq: Every day | ORAL | Status: DC
  Administered 2019-10-28 – 2019-10-29 (×2): 80 mg via ORAL
  Filled 2019-10-27 (×2): qty 2

## 2019-10-27 MED ORDER — OXYCODONE-ACETAMINOPHEN 10-325 MG PO TABS: 1.0000 | ORAL_TABLET | ORAL | Status: DC

## 2019-10-27 MED ORDER — RENA-VITE PO TABS
1.0000 | ORAL_TABLET | Freq: Every day | ORAL | Status: DC
  Administered 2019-10-28: 1 via ORAL
  Filled 2019-10-27: qty 1

## 2019-10-27 MED ORDER — ATROPINE SULFATE 1 MG/10ML IJ SOSY
0.5000 mg | PREFILLED_SYRINGE | Freq: Once | INTRAMUSCULAR | Status: AC
  Administered 2019-10-27: 0.5 mg via INTRAVENOUS

## 2019-10-27 MED ORDER — DEXTROSE 50 % IV SOLN
1.0000 | Freq: Once | INTRAVENOUS | Status: AC
  Administered 2019-10-27: 50 mL via INTRAVENOUS
  Filled 2019-10-27: qty 50

## 2019-10-27 MED ORDER — DULAGLUTIDE 0.75 MG/0.5ML ~~LOC~~ SOAJ: 0.7500 mg | SUBCUTANEOUS | Status: DC

## 2019-10-27 MED ORDER — OXYCODONE-ACETAMINOPHEN 5-325 MG PO TABS
1.0000 | ORAL_TABLET | Freq: Every day | ORAL | Status: DC
  Administered 2019-10-28: 1 via ORAL
  Filled 2019-10-27: qty 1

## 2019-10-27 MED ORDER — LIDOCAINE-EPINEPHRINE (PF) 2 %-1:200000 IJ SOLN
20.0000 mL | Freq: Once | INTRAMUSCULAR | Status: DC
  Filled 2019-10-27: qty 20

## 2019-10-27 MED ORDER — CALCIUM GLUCONATE 10 % IV SOLN
1.0000 g | Freq: Once | INTRAVENOUS | Status: AC
  Administered 2019-10-27: 1 g via INTRAVENOUS
  Filled 2019-10-27: qty 10

## 2019-10-27 MED ORDER — POLYVINYL ALCOHOL 1.4 % OP SOLN: 1.0000 [drp] | Freq: Every evening | OPHTHALMIC | Status: DC | PRN

## 2019-10-27 MED ORDER — OXYCODONE HCL 5 MG PO TABS
5.0000 mg | ORAL_TABLET | ORAL | Status: DC
  Administered 2019-10-28: 5 mg via ORAL
  Filled 2019-10-27: qty 1

## 2019-10-27 MED ORDER — OXYCODONE-ACETAMINOPHEN 5-325 MG PO TABS
1.0000 | ORAL_TABLET | ORAL | Status: DC
  Administered 2019-10-28: 1 via ORAL
  Filled 2019-10-27: qty 1

## 2019-10-27 MED ORDER — ONDANSETRON HCL 4 MG/2ML IJ SOLN: 4.0000 mg | Freq: Four times a day (QID) | INTRAMUSCULAR | Status: DC | PRN

## 2019-10-27 MED ORDER — CHLORHEXIDINE GLUCONATE CLOTH 2 % EX PADS: 6.0000 | MEDICATED_PAD | Freq: Every day | CUTANEOUS | Status: DC

## 2019-10-27 MED ORDER — INSULIN ASPART 100 UNIT/ML ~~LOC~~ SOLN: 0.0000 [IU] | Freq: Three times a day (TID) | SUBCUTANEOUS | Status: DC

## 2019-10-27 MED ORDER — GABAPENTIN 100 MG PO CAPS
200.0000 mg | ORAL_CAPSULE | ORAL | Status: DC
  Filled 2019-10-27: qty 2

## 2019-10-27 MED ORDER — LACTATED RINGERS IV BOLUS
500.0000 mL | Freq: Once | INTRAVENOUS | Status: AC
  Administered 2019-10-27: 500 mL via INTRAVENOUS

## 2019-10-27 MED ORDER — ATROPINE SULFATE 1 MG/10ML IJ SOSY
0.5000 mg | PREFILLED_SYRINGE | Freq: Once | INTRAMUSCULAR | Status: AC
  Administered 2019-10-27: 0.5 mg via INTRAVENOUS
  Filled 2019-10-27 (×2): qty 10

## 2019-10-27 MED ORDER — GABAPENTIN 100 MG PO CAPS
100.0000 mg | ORAL_CAPSULE | Freq: Every day | ORAL | Status: DC
  Administered 2019-10-28: 100 mg via ORAL
  Filled 2019-10-27: qty 1

## 2019-10-27 MED ORDER — HYDROXYZINE HCL 25 MG PO TABS
25.0000 mg | ORAL_TABLET | Freq: Two times a day (BID) | ORAL | Status: DC
  Administered 2019-10-28 – 2019-10-29 (×3): 25 mg via ORAL
  Filled 2019-10-27 (×3): qty 1

## 2019-10-27 MED ORDER — ONDANSETRON HCL 4 MG PO TABS: 4.0000 mg | ORAL_TABLET | Freq: Four times a day (QID) | ORAL | Status: DC | PRN

## 2019-10-27 MED ORDER — ASPIRIN EC 81 MG PO TBEC
81.0000 mg | DELAYED_RELEASE_TABLET | ORAL | Status: DC
  Administered 2019-10-28: 81 mg via ORAL
  Filled 2019-10-27: qty 1

## 2019-10-27 MED ORDER — GABAPENTIN 100 MG PO CAPS
100.0000 mg | ORAL_CAPSULE | ORAL | Status: DC
  Administered 2019-10-29: 100 mg via ORAL
  Filled 2019-10-27: qty 1

## 2019-10-27 MED ORDER — DEXTROSE 10 % IV SOLN: Freq: Once | INTRAVENOUS | Status: AC

## 2019-10-27 MED ORDER — TRAZODONE HCL 50 MG PO TABS
50.0000 mg | ORAL_TABLET | Freq: Every day | ORAL | Status: DC
  Administered 2019-10-28: 50 mg via ORAL
  Filled 2019-10-27: qty 1

## 2019-10-27 MED ORDER — DIPHENHYDRAMINE HCL 25 MG PO CAPS: 50.0000 mg | ORAL_CAPSULE | ORAL | Status: DC

## 2019-10-27 NOTE — ED Triage Notes (Signed)
Pt arrives via [facility], pt got dizzy and fell, lac to the back of his head. No loc, no blood thinners. Pt bradycardic 40's, initial saturations in the 80's by FD. Pressures 86-100SBP. Unable to gain IV acess.

## 2019-10-27 NOTE — ED Notes (Signed)
pts wife and/or daughter wants an update as soon as possible.

## 2019-10-27 NOTE — ED Notes (Signed)
Medications given and charted per MAR. Tolerated well.  

## 2019-10-27 NOTE — Consult Note (Signed)
[facility] Admit Date: 10/27/2019 10/27/2019 [place] Agent Requesting Physician:  Reather Converse  Reason for Consult:  ESRD, Hyperkalemia, Bradycardia, Fall HPI:  12M ESRD MWF [facility] arrived via EMS to the ER today when he became dizzy, fell and had an injury to the back of his head.  He was found to have bradycardia in the 40s and blood pressures in the 61P to 509T systolic.  On work-up EKG showed junctional rhythm with mildly prolonged QRS, no peaked T waves.  Labs are notable for potassium of 6.8 VBG shows a pH of 7.27.  Head CT with no acute intracranial abnormalities.  Hyperkalemia has received insulin, calcium, sodium bicarbonate.  Patient's last dialysis treatment was on 2/19, leaving 5 kg above his EDW of 88 kg.  He completed his full treatment.  He has cirrhosis with recurrent large volume ascites and is due for a paracentesis later this week.  Outpatient dialysis order: 4 hours, MWF, left upper arm AV fistula, BFR 450, DFR 800, EDW 88 kg, 2 potassium, 2 calcium bath.  No heparin.  He receives Mircera 50 mcg every 2 weeks and is currently receiving intravenous iron.  His potassium on 2/10 was 4.5.  He states that he received 200 mg of gabapentin, 0.25 mg of ropinirole, and 25 mg of Benadryl prior to his procedure so that he can complete them.  At the time of my exam the patient is alert, conversant.  He denies recent new medications.  No clear dietary potassium excess.   Creat (mg/dL)  Date Value  06/20/2014 2.46 (H)  06/09/2014 2.23 (H)  06/04/2014 2.36 (H)  03/27/2014 2.38 (H)  03/18/2014 2.48 (H)  03/11/2014 2.52 (H)  03/05/2014 2.59 (H)   Creatinine, Ser (mg/dL)  Date Value  10/27/2019 12.10 (H)  10/27/2019 11.21 (H)  11/21/2018 6.71 (H)  11/20/2018 10.69 (H)  08/01/2017 4.46 (H)  07/31/2017 5.88 (H)  07/29/2017 5.32 (H)  07/28/2017 6.09 (H)  07/26/2017 7.06 (H)  07/25/2017 10.64 (H)  ] ROS Balance of 12 systems is negative w/ exceptions as above  PMH  Past Medical  History:  Diagnosis Date  . Anemia   . Arthritis, septic, knee ([place])   . C. difficile colitis 04/18/2008  . Congestive heart failure ([place])   . Diabetes mellitus    Type II  . DVT (deep venous thrombosis) ([facility])    right leg   . Dyspnea    "when I have too much fluid.'  . ESRD (end stage renal disease) ([place])    Tues, Thurs  . GERD (gastroesophageal reflux disease)   . History of blood transfusion 2017  . Pneumonia    PSH  Past Surgical History:  Procedure Laterality Date  . APPENDECTOMY  1995  . BASCILIC VEIN TRANSPOSITION Left 01/03/2017   Procedure: FIRST STAGE BRACHIAL VEIN TRANSPOSITION;  Surgeon: Conrad Blythe, MD;  Location: [place];  Service: Vascular;  Laterality: Left;  . BASCILIC VEIN TRANSPOSITION Left 04/24/2017   Procedure: LEFT SECOND STAGE BRACHIAL VEIN TRANSPOSITION;  Surgeon: Conrad Crainville, MD;  Location: [place];  Service: Vascular;  Laterality: Left;  . EYE SURGERY    . INSERTION OF DIALYSIS CATHETER Right 01/03/2017   Procedure: INSERTION OF DIALYSIS CATHETER RIGHT INTERNAL JUGULAR;  Surgeon: Conrad Rowena, MD;  Location: [place];  Service: Vascular;  Laterality: Right;  . IR PARACENTESIS  12/14/2017  . IR PARACENTESIS  02/01/2018  . IR PARACENTESIS  03/28/2018  . IR PARACENTESIS  05/17/2018  . IR PARACENTESIS  05/31/2018  . IR PARACENTESIS  06/12/2018  . IR PARACENTESIS  06/26/2018  . IR PARACENTESIS  07/10/2018  . IR PARACENTESIS  07/30/2018  . IR PARACENTESIS  08/30/2018  . IR PARACENTESIS  09/13/2018  . IR PARACENTESIS  09/27/2018  . IR PARACENTESIS  10/11/2018  . IR PARACENTESIS  10/30/2018  . IR PARACENTESIS  11/29/2018  . IR PARACENTESIS  01/22/2019  . IR PARACENTESIS  02/21/2019  . IR PARACENTESIS  03/21/2019  . IR PARACENTESIS  04/02/2019  . IR PARACENTESIS  04/18/2019  . IR PARACENTESIS  05/02/2019  . IR PARACENTESIS  05/16/2019  . IR PARACENTESIS  05/30/2019  . IR PARACENTESIS  06/14/2019  . IR PARACENTESIS  06/27/2019  . IR PARACENTESIS  07/11/2019  . IR PARACENTESIS   07/25/2019  . IR PARACENTESIS  08/06/2019  . IR PARACENTESIS  08/15/2019  . IR PARACENTESIS  08/29/2019  . IR PARACENTESIS  09/19/2019  . IR PARACENTESIS  10/04/2019  . KNEE SURGERY Left 04/2014  . [place]  Bilateral   . PERICARDIOCENTESIS N/A 01/03/2017   Procedure: Pericardiocentesis;  Surgeon: Burnell Blanks, MD;  Location: [facility];  Service: Cardiovascular;  Laterality: N/A;   FH  Family History  Problem Relation Age of Onset  . Heart disease Other        No family history  . Cancer Brother    Gans  reports that he has never smoked. He has quit using smokeless tobacco.  His smokeless tobacco use included chew. He reports that he does not drink alcohol or use drugs. Allergies  Allergies  Allergen Reactions  . Prednisone Other (See Comments)    BLINDNESS > [ ? CSCR ? ]  . Tape Other (See Comments)    Paper tape causes blisters   . Simvastatin Other (See Comments)    Myalgia, joint pain  . Lorazepam Anxiety and Other (See Comments)    Nervousness and "fidgets"; legs "need to kick, "electrical currents"  . Methocarbamol Diarrhea  . Seroquel [Quetiapine Fumarate] Other (See Comments)    QT prolongation   Home medications Prior to Admission medications   Medication Sig Start Date End Date Taking? Authorizing Provider  acetaminophen (TYLENOL) 500 MG tablet Take 1,000 mg by mouth See admin instructions. Take 2 tablets (1000 mg) by mouth during dialysis on Monday, Wednesday, Friday, take 2 tablets (1000 mg) daily at bedtime   Yes [provider]  amLODipine (NORVASC) 10 MG tablet TAKE 1 TABLET BY MOUTH EVERY DAY Patient taking differently: Take 10 mg by mouth at bedtime.  07/01/19  Yes [name], MD  aspirin EC 81 MG tablet Take 81 mg by mouth See admin instructions. Take one tablet (81 mg) by mouth every other night   Yes [provider]  b complex-vitamin c-folic acid (NEPHRO-VITE) 0.8 MG TABS tablet Take 1 tablet by mouth at bedtime.   03/12/18  Yes [provider]  carvedilol (COREG) 25 MG tablet TAKE 1 TABLET TWICE A DAY WITH FOOD Patient taking differently: Take 25 mg by mouth in the morning and at bedtime.  09/16/19  Yes [name], MD  diphenhydrAMINE (BENADRYL) 25 MG tablet Take 25 mg by mouth See admin instructions. Take 2 tablets (50 mg) by mouth during dialysis on Monday, Wednesday, Friday, take 1-2 tablets (25-50 mg) daily at bedtime   Yes [provider]  ferric citrate (AURYXIA) 1 GM 210 MG(Fe) tablet Take 210-420 mg by mouth See admin instructions. Take 2 tablets (420 mg) by  mouth three times daily with meals, take 1 tablet (210 mg) with snacks   Yes [provider]  furosemide (LASIX) 80 MG tablet TAKE 1 TABLET (80 MG TOTAL) BY MOUTH 2 (TWO) TIMES DAILY. Patient taking differently: Take 80-160 mg by mouth See admin instructions. Take 2 tablets (160 mg) by mouth twice daily, may take an additional 1 or 2 tablets (80-160 mg) as needed for bloating 09/23/19  Yes [name], MD  gabapentin (NEURONTIN) 100 MG capsule Take 2 capsules (200 mg total) by mouth 2 (two) times daily as needed (Give one hour prior to hemodialysis session as needed for anxiety/restlessness). Patient taking differently: Take 100-200 mg by mouth See admin instructions. On Monday, Wednesday, Friday (dialysis days) - take 2 capsules (200 mg) by mouth before dialysis and take 1 capsule (100 mg) at bedtime; on Sunday, Tuesday, Thursday, Saturday (non-dialysis days) - take 1 capsule (100 mg) twice daily 03/26/19 02/03/20 Yes [name], MD  hydrALAZINE (APRESOLINE) 25 MG tablet TAKE 1 TABLET (25 MG TOTAL) 3 (THREE) TIMES DAILY BY MOUTH. Patient taking differently: Take 25 mg by mouth in the morning and at bedtime.  02/13/18  Yes [name], MD  hydrOXYzine (ATARAX/VISTARIL) 25 MG tablet Take 25 mg by mouth 2 (two) times daily.  08/06/19  Yes [provider]  isosorbide mononitrate (IMDUR)  30 MG 24 hr tablet Take 1 tablet (30 mg total) by mouth daily. Patient taking differently: Take 30 mg by mouth at bedtime.  07/09/19  Yes [name], MD  lidocaine-prilocaine (EMLA) cream APPLY THREE TIMES A WEEK AS DIRECTED 1 HOUR PRIOR TO DIALYSIS AND WRAP WITH PLASTIC WRAP Patient taking differently: Apply 1 application topically See admin instructions. Apply topically one hour before dialysis on Monday, Wednesday, Friday (cover with plastic wrap) 09/02/19  Yes [name], MD  omeprazole (PRILOSEC) 40 MG capsule TAKE 1 CAPSULE BY MOUTH EVERY DAY Patient taking differently: Take 40 mg by mouth daily.  09/16/19  Yes [name], MD  oxyCODONE-acetaminophen (PERCOCET) 10-325 MG tablet Take 1 tablet by mouth every 4 (four) hours as needed for pain. Patient taking differently: Take 1 tablet by mouth See admin instructions. Take 1 tablet by mouth after dialysis on Monday, Wednesday, Friday, take 1 tablet daily at bedtime, may also take 1 tablet at 5 pm as needed for pain 10/22/19  Yes [name], MD  polyvinyl alcohol (ARTIFICIAL TEARS) 1.4 % ophthalmic solution Place 1 drop into both eyes at bedtime as needed for dry eyes (itching).   Yes [provider]  rOPINIRole (REQUIP) 0.25 MG tablet TAKE 1 TO 2 TABLETS AT BEDTIME Patient taking differently: Take 0.5 mg by mouth See admin instructions. Take 2 tablets (0.5 mg) by mouth before dialysis on Monday, Wednesday, Friday, take 2 tablets (0.5 mg) daily at bedtime 07/01/19  Yes [name], MD  tamsulosin (FLOMAX) 0.4 MG CAPS capsule TAKE 2 CAPSULES BY MOUTH DAILY AFTER SUPPER Patient taking differently: Take 0.8 mg by mouth at bedtime.  09/16/19  Yes [name], MD  traZODone (DESYREL) 50 MG tablet TAKE 1 TABLET AT BEDTIME Patient taking differently: Take 50 mg by mouth at bedtime.  09/02/19  Yes [name], MD  TRULICITY 6.25 WL/8.9HT SOPN INJECT 0.75MG  EVERY 7 DAYS Patient  taking differently: Inject 0.75 mg into the skin every Saturday.  01/17/19  Yes [name], MD  Vitamin D, Ergocalciferol, (DRISDOL) 1.25 MG (50000 UNIT) CAPS capsule Take 50,000 Units  by mouth every 7 (seven) days.    [provider]    Current Medications Scheduled Meds: . [START ON 10/28/2019] Chlorhexidine Gluconate Cloth  6 each Topical Q0600  . diphenhydrAMINE  25 mg Oral Once in dialysis  . gabapentin  200 mg Oral Once in dialysis  . lidocaine-EPINEPHrine  20 mL Intradermal Once  . rOPINIRole  0.25 mg Oral Once in dialysis   Continuous Infusions: PRN Meds:.  CBC Recent Labs  Lab 10/27/19 2031 10/27/19 2057  WBC 9.0  --   NEUTROABS 6.8  --   HGB 10.2* 10.9*  10.5*  HCT 32.9* 32.0*  31.0*  MCV 92.4  --   PLT 231  --    Basic Metabolic Panel Recent Labs  Lab 10/27/19 2031 10/27/19 2057  NA 134* 135  134*  K 6.9* 6.8*  6.8*  CL 94* 101  CO2 21*  --   GLUCOSE 155* 145*  BUN 84* 93*  CREATININE 11.21* 12.10*  CALCIUM 8.8*  --     Physical Exam  Blood pressure 122/68, pulse 63, temperature 97.9 F (36.6 C), temperature source Oral, resp. rate 16, height 6\' 4"  (1.93 m), weight 88.5 kg, SpO2 96 %. GEN: NAD, lying in bed, awake, conversant ENT: NCAT, scalp hematoma present EYES: EOMI CV: Regular, normal S1 and S2 PULM: Clear bilaterally ABD: Distended, nontender, soft SKIN: No rashes or lesions EXT: No significant lower extremity edema VASCULAR: Left upper arm AV fistula with stable strong bruit and thrill   Assessment 76M ESRD MWF [facility] with fall, presenting with bradycardia and hyperkalemia  1. ESRD, MWF, LUE AVF, [facility] 2. Hyperkalemia with EKG changes, severe; received calcium, insulin/dextrose, atropine, sodium bicarbonate in ER 3. Dizziness/bradycardia, heart rate has improved with treatment of hyperkalemia, atropine 4. Cirrhosis with recurrent large volume ascites, intermittent paracentesis, scheduled for next  week 5. Anxiety on at dialysis, receives gabapentin, ropinirole, Benadryl treatment, per him 6. Anemia, stable  Plan 1. HD tonight, 3 hours, start with 1K bath and transition to 2K.  No UF.  No heparin. AVF, Qb [place] pgr 10/27/2019, 10:35 PM

## 2019-10-27 NOTE — ED Notes (Signed)
Radiology notified pt stable for transport

## 2019-10-27 NOTE — ED Notes (Signed)
[place] pts wife wants an update

## 2019-10-27 NOTE — ED Provider Notes (Signed)
[facility] Provider Note   CSN: [id number] Arrival date & time: 10/27/19  2030     History Chief Complaint  Patient presents with  . Fall    [facility] is a 54 y.o. male.  The history is provided by the patient, the EMS personnel and medical records.  The patient is a 54 year old male with a past medical history of congestive heart failure, ESRD on dialysis, recurrent ascites, diabetes, Charcot foot who presents to the ED for bradycardia, hypertension and lightheadedness.  Patient reports he got up to walk earlier tonight became very weak and lightheaded and fell striking the back of his head without loss of consciousness or syncope.  The patient denies any chest pain or shortness of breath.  The patient was found to be bradycardic in the 30s with a blood pressure in the 45Y systolic on EMS arrival, the patient was awake and alert and oriented x4.  The patient denies any fevers or chills, nausea, vomiting, diarrhea.  The patient is a Monday/Wednesday/Friday dialysis patient is scheduled for dialysis tomorrow morning.     Past Medical History:  Diagnosis Date  . Anemia   . Arthritis, septic, knee ([place])   . C. difficile colitis 04/18/2008  . Congestive heart failure ([place])   . Diabetes mellitus    Type II  . DVT (deep venous thrombosis) (H[facility])    right leg   . Dyspnea    "when I have too much fluid.'  . ESRD (end stage renal disease) ([place])    Tues, Thurs  . GERD (gastroesophageal reflux disease)   . History of blood transfusion 2017  . Pneumonia     Patient Active Problem List   Diagnosis Date Noted  . Pruritus, unspecified 05/10/2019  . Primary osteoarthritis of left knee 12/27/2018  . Mild protein-calorie malnutrition ([place]) 10/29/2018  . History of Clostridium difficile colitis 08/23/2018  . Secondary hyperparathyroidism of renal origin ([place]) 04/06/2018  . Anxiety 07/31/2017  . Encounter for chronic pain management 06/15/2017    . Embolism due to vascular prosthetic devices, implants and grafts, subsequent encounter 01/31/2017  . Iron deficiency anemia, unspecified 01/26/2017  . Dependence on renal dialysis ([place]) 01/23/2017  . Coagulation defect, unspecified ([place]) 01/14/2017  . Anemia in chronic kidney disease 01/09/2017  . Pericardial effusion without cardiac tamponade   . ESRD (end stage renal disease) on dialysis ([place])   . Hypertensive heart and chronic kidney disease with heart failure and stage 1 through stage 4 chronic kidney disease, or chronic kidney disease ([place])   . Acute on chronic combined systolic and diastolic congestive heart failure ([place]) 12/30/2016  . BPH (benign prostatic hyperplasia) 12/30/2016  . Charcot foot due to diabetes mellitus ([place]) 10/23/2015  . Nonischemic cardiomyopathy ([place]) 09/17/2014  . Insomnia 07/16/2014  . Pericardial effusion 06/25/2014  . Bilateral pleural effusion 06/10/2014  . Constipation 04/20/2014  . Chronic combined systolic and diastolic heart failure ([place]) 04/17/2014  . Type 2 diabetes mellitus with diabetic chronic kidney disease ([place]) 04/17/2014  . Leukocytosis 04/17/2014  . Microcytic anemia 04/17/2014  . Mild pulmonary hypertension ([place]) 04/17/2014  . Vitiligo 03/13/2014  . Diabetic retinopathy associated with type 2 diabetes mellitus ([place]) 01/12/2012  . Hypertriglyceridemia 01/12/2012  . Essential hypertension, benign 01/12/2012  . Elevated LFTs 01/12/2012  . Controlled type 2 diabetes mellitus with renal manifestation ([place]) 01/03/2012  . Chronic systolic CHF (congestive heart failure), NYHA class 1 ([place]) 01/03/2012    Past Surgical History:  Procedure Laterality  Date  . APPENDECTOMY  1995  . BASCILIC VEIN TRANSPOSITION Left 01/03/2017   Procedure: FIRST STAGE BRACHIAL VEIN TRANSPOSITION;  Surgeon: Conrad Monte Sereno, MD;  Location: [facility];  Service: Vascular;  Laterality: Left;  . BASCILIC VEIN TRANSPOSITION Left 04/24/2017   Procedure: LEFT SECOND STAGE  BRACHIAL VEIN TRANSPOSITION;  Surgeon: Conrad Radcliff, MD;  Location: [facility];  Service: Vascular;  Laterality: Left;  . EYE SURGERY    . INSERTION OF DIALYSIS CATHETER Right 01/03/2017   Procedure: INSERTION OF DIALYSIS CATHETER RIGHT INTERNAL JUGULAR;  Surgeon: Conrad Carthage, MD;  Location: [facility];  Service: Vascular;  Laterality: Right;  . IR PARACENTESIS  12/14/2017  . IR PARACENTESIS  02/01/2018  . IR PARACENTESIS  03/28/2018  . IR PARACENTESIS  05/17/2018  . IR PARACENTESIS  05/31/2018  . IR PARACENTESIS  06/12/2018  . IR PARACENTESIS  06/26/2018  . IR PARACENTESIS  07/10/2018  . IR PARACENTESIS  07/30/2018  . IR PARACENTESIS  08/30/2018  . IR PARACENTESIS  09/13/2018  . IR PARACENTESIS  09/27/2018  . IR PARACENTESIS  10/11/2018  . IR PARACENTESIS  10/30/2018  . IR PARACENTESIS  11/29/2018  . IR PARACENTESIS  01/22/2019  . IR PARACENTESIS  02/21/2019  . IR PARACENTESIS  03/21/2019  . IR PARACENTESIS  04/02/2019  . IR PARACENTESIS  04/18/2019  . IR PARACENTESIS  05/02/2019  . IR PARACENTESIS  05/16/2019  . IR PARACENTESIS  05/30/2019  . IR PARACENTESIS  06/14/2019  . IR PARACENTESIS  06/27/2019  . IR PARACENTESIS  07/11/2019  . IR PARACENTESIS  07/25/2019  . IR PARACENTESIS  08/06/2019  . IR PARACENTESIS  08/15/2019  . IR PARACENTESIS  08/29/2019  . IR PARACENTESIS  09/19/2019  . IR PARACENTESIS  10/04/2019  . KNEE SURGERY Left 04/2014  . [place]  Bilateral   . PERICARDIOCENTESIS N/A 01/03/2017   Procedure: Pericardiocentesis;  Surgeon: Burnell Blanks, MD;  Location: [facility];  Service: Cardiovascular;  Laterality: N/A;       Family History  Problem Relation Age of Onset  . Heart disease Other        No family history  . Cancer Brother     Social History   Tobacco Use  . Smoking status: Never Smoker  . Smokeless tobacco: Former Systems developer    Types: Chew  . Tobacco comment: no chewing 01/2017  Substance Use Topics  . Alcohol use: No    Alcohol/week: 0.0 standard drinks  . Drug  use: No    Home Medications Prior to Admission medications   Medication Sig Start Date End Date Taking? Authorizing Provider  acetaminophen (TYLENOL) 500 MG tablet Take 1,000 mg by mouth See admin instructions. Take 2 tablets (1000 mg) by mouth during dialysis on Monday, Wednesday, Friday, take 2 tablets (1000 mg) daily at bedtime   Yes [provider]  amLODipine (NORVASC) 10 MG tablet TAKE 1 TABLET BY MOUTH EVERY DAY Patient taking differently: Take 10 mg by mouth at bedtime.  07/01/19  Yes [name], MD  aspirin EC 81 MG tablet Take 81 mg by mouth See admin instructions. Take one tablet (81 mg) by mouth every other night   Yes [provider]  b complex-vitamin c-folic acid (NEPHRO-VITE) 0.8 MG TABS tablet Take 1 tablet by mouth at bedtime.  03/12/18  Yes [provider]  carvedilol (COREG) 25 MG tablet TAKE 1 TABLET TWICE A DAY WITH FOOD Patient taking differently: Take 25 mg by mouth in the  morning and at bedtime.  09/16/19  Yes [name], MD  diphenhydrAMINE (BENADRYL) 25 MG tablet Take 25 mg by mouth See admin instructions. Take 2 tablets (50 mg) by mouth during dialysis on Monday, Wednesday, Friday, take 1-2 tablets (25-50 mg) daily at bedtime   Yes [provider]  ferric citrate (AURYXIA) 1 GM 210 MG(Fe) tablet Take 210-420 mg by mouth See admin instructions. Take 2 tablets (420 mg) by mouth three times daily with meals, take 1 tablet (210 mg) with snacks   Yes [provider]  furosemide (LASIX) 80 MG tablet TAKE 1 TABLET (80 MG TOTAL) BY MOUTH 2 (TWO) TIMES DAILY. Patient taking differently: Take 80-160 mg by mouth See admin instructions. Take 2 tablets (160 mg) by mouth twice daily, may take an additional 1 or 2 tablets (80-160 mg) as needed for bloating 09/23/19  Yes [name], MD  gabapentin (NEURONTIN) 100 MG capsule Take 2 capsules (200 mg total) by mouth 2 (two) times daily as needed (Give one hour prior  to hemodialysis session as needed for anxiety/restlessness). Patient taking differently: Take 100-200 mg by mouth See admin instructions. On Monday, Wednesday, Friday (dialysis days) - take 2 capsules (200 mg) by mouth before dialysis and take 1 capsule (100 mg) at bedtime; on Sunday, Tuesday, Thursday, Saturday (non-dialysis days) - take 1 capsule (100 mg) twice daily 03/26/19 02/03/20 Yes [name], MD  hydrALAZINE (APRESOLINE) 25 MG tablet TAKE 1 TABLET (25 MG TOTAL) 3 (THREE) TIMES DAILY BY MOUTH. Patient taking differently: Take 25 mg by mouth in the morning and at bedtime.  02/13/18  Yes [name], MD  hydrOXYzine (ATARAX/VISTARIL) 25 MG tablet Take 25 mg by mouth 2 (two) times daily.  08/06/19  Yes [provider]  isosorbide mononitrate (IMDUR) 30 MG 24 hr tablet Take 1 tablet (30 mg total) by mouth daily. Patient taking differently: Take 30 mg by mouth at bedtime.  07/09/19  Yes [name], MD  lidocaine-prilocaine (EMLA) cream APPLY THREE TIMES A WEEK AS DIRECTED 1 HOUR PRIOR TO DIALYSIS AND WRAP WITH PLASTIC WRAP Patient taking differently: Apply 1 application topically See admin instructions. Apply topically one hour before dialysis on Monday, Wednesday, Friday (cover with plastic wrap) 09/02/19  Yes [name], MD  omeprazole (PRILOSEC) 40 MG capsule TAKE 1 CAPSULE BY MOUTH EVERY DAY Patient taking differently: Take 40 mg by mouth daily.  09/16/19  Yes [name], MD  oxyCODONE-acetaminophen (PERCOCET) 10-325 MG tablet Take 1 tablet by mouth every 4 (four) hours as needed for pain. Patient taking differently: Take 1 tablet by mouth See admin instructions. Take 1 tablet by mouth after dialysis on Monday, Wednesday, Friday, take 1 tablet daily at bedtime, may also take 1 tablet at 5 pm as needed for pain 10/22/19  Yes [name], MD  polyvinyl alcohol (ARTIFICIAL TEARS) 1.4 % ophthalmic solution Place 1 drop into both eyes  at bedtime as needed for dry eyes (itching).   Yes [provider]  rOPINIRole (REQUIP) 0.25 MG tablet TAKE 1 TO 2 TABLETS AT BEDTIME Patient taking differently: Take 0.5 mg by mouth See admin instructions. Take 2 tablets (0.5 mg) by mouth before dialysis on Monday, Wednesday, Friday, take 2 tablets (0.5 mg) daily at bedtime 07/01/19  Yes [name], MD  tamsulosin (FLOMAX) 0.4 MG CAPS capsule TAKE 2 CAPSULES BY MOUTH DAILY AFTER SUPPER Patient taking differently: Take 0.8 mg by mouth at bedtime.  09/16/19  Yes  [name], MD  traZODone (DESYREL) 50 MG tablet TAKE 1 TABLET AT BEDTIME Patient taking differently: Take 50 mg by mouth at bedtime.  09/02/19  Yes [name], MD  TRULICITY 3.29 JM/4.2AS SOPN INJECT 0.75MG  EVERY 7 DAYS Patient taking differently: Inject 0.75 mg into the skin every Saturday.  01/17/19  Yes [name], MD  Vitamin D, Ergocalciferol, (DRISDOL) 1.25 MG (50000 UNIT) CAPS capsule Take 50,000 Units by mouth every 7 (seven) days.    [provider]    Allergies    Prednisone, Tape, Simvastatin, Lorazepam, Methocarbamol, and Seroquel [quetiapine fumarate]  Review of Systems   Review of Systems  Constitutional: Positive for fatigue. Negative for chills and fever.  Respiratory: Negative for cough and shortness of breath.   Cardiovascular: Negative for chest pain.  Gastrointestinal: Positive for abdominal distention. Negative for abdominal pain, nausea and vomiting.  Neurological: Positive for weakness and light-headedness. Negative for syncope.  All other systems reviewed and are negative.   Physical Exam Updated Vital Signs  ED Triage Vitals  Enc Vitals Group     BP 10/27/19 2031 (!) 77/41     Pulse Rate 10/27/19 2031 (!) 38     Resp 10/27/19 2031 12     Temp 10/27/19 2031 97.9 F (36.6 C)     Temp Source 10/27/19 2031 Oral     SpO2 10/27/19 2031 100 %     Weight 10/27/19 2131 195 lb (88.5 kg)     Height  10/27/19 2131 6\' 4"  (1.93 m)     Head Circumference --      Peak Flow --      Pain Score 10/27/19 2131 9     Pain Loc --      Pain Edu? --      Excl. in [place]? --     Physical Exam Vitals and nursing note reviewed.  Constitutional:      General: He is in acute distress.     Appearance: He is well-developed. He is obese. He is ill-appearing.  HENT:     Head: Normocephalic and atraumatic.     Mouth/Throat:     Mouth: Mucous membranes are moist.     Pharynx: Oropharynx is clear.  Eyes:     General: No scleral icterus.    Conjunctiva/sclera: Conjunctivae normal.  Cardiovascular:     Rate and Rhythm: Regular rhythm. Bradycardia present.     Pulses: Normal pulses.     Heart sounds: No murmur.  Pulmonary:     Effort: Pulmonary effort is normal. No respiratory distress.     Breath sounds: Normal breath sounds.  Abdominal:     General: There is distension.     Palpations: Abdomen is soft.     Tenderness: There is no abdominal tenderness. There is no guarding or rebound.  Musculoskeletal:        General: No deformity.     Cervical back: Neck supple. No rigidity.     Comments: Right lower leg in boot  Skin:    General: Skin is warm and dry.     Coloration: Skin is pale.  Neurological:     General: No focal deficit present.     Mental Status: He is alert and oriented to person, place, and time.     ED Results / Procedures / Treatments   Labs (all labs ordered are listed, but only abnormal results are displayed) Labs Reviewed  CBC WITH DIFFERENTIAL/PLATELET - Abnormal; Notable for the following components:  Result Value   RBC 3.56 (*)    Hemoglobin 10.2 (*)    HCT 32.9 (*)    Abs Immature Granulocytes 0.14 (*)    All other components within normal limits  I-STAT CHEM 8, ED - Abnormal; Notable for the following components:   Sodium 134 (*)    Potassium 6.8 (*)    BUN 93 (*)    Creatinine, Ser 12.10 (*)    Glucose, Bld 145 (*)    Calcium, Ion 1.03 (*)    Hemoglobin  10.5 (*)    HCT 31.0 (*)    All other components within normal limits  POCT I-STAT EG7 - Abnormal; Notable for the following components:   Acid-base deficit 5.0 (*)    Potassium 6.8 (*)    Calcium, Ion 1.05 (*)    HCT 32.0 (*)    Hemoglobin 10.9 (*)    All other components within normal limits  CULTURE, BLOOD (ROUTINE X 2)  CULTURE, BLOOD (ROUTINE X 2)  LACTIC ACID, PLASMA  COMPREHENSIVE METABOLIC PANEL  MAGNESIUM  BRAIN NATRIURETIC PEPTIDE  LACTIC ACID, PLASMA  TSH  I-STAT VENOUS BLOOD GAS, ED  TROPONIN I (HIGH SENSITIVITY)  TROPONIN I (HIGH SENSITIVITY)    EKG EKG Interpretation  Date/Time:  Sunday October 27 2019 20:31:31 EST Ventricular Rate:  48 PR Interval:    QRS Duration: 127 QT Interval:  520 QTC Calculation: 403 R Axis:   -51 Text Interpretation: Junctional rhythm LVH with IVCD, LAD and secondary repol abnrm When compared with ECG of 11/20/2018, No significant change was found Confirmed by Glick, David ([id number]) on 10/27/2019 11:28:16 PM Also confirmed by Glick, David ([id number]), editor Pritchett, LaVerne ([id number])  on 10/28/2019 8:26:48 AM   Radiology DG Chest Portable 1 View  Result Date: 10/27/2019 CLINICAL DATA:  Hypotension, dizziness, fell EXAM: PORTABLE CHEST 1 VIEW COMPARISON:  11/20/2018 FINDINGS: Single frontal view of the chest demonstrates stable enlargement of the cardiac silhouette. No airspace disease, effusion, or pneumothorax. No acute bony abnormalities. External defibrillator pad overlies left chest. IMPRESSION: 1. Stable enlarged cardiac silhouette. 2. No acute airspace disease. Electronically Signed   By: Michael  Brown M.D.   On: 10/27/2019 21:09    Procedures Procedures (including critical care time)  Medications Ordered in ED Medications  lactated ringers bolus 500 mL (0 mLs Intravenous Stopped 10/27/19 2142)  atropine 1 MG/10ML injection 0.5 mg (0.5 mg Intravenous Given 10/27/19 2052)  calcium gluconate inj 10% (1 g) URGENT USE ONLY! (1 g  Intravenous Given 10/27/19 2112)  insulin aspart (novoLOG) injection 5 Units (5 Units Intravenous Given 10/27/19 2112)    And  dextrose 50 % solution 50 mL (50 mLs Intravenous Given 10/27/19 2112)  dextrose 10 % infusion ( Intravenous New Bag/Given 10/27/19 2141)  sodium bicarbonate injection 50 mEq (50 mEq Intravenous Given 10/27/19 2112)  atropine 1 MG/10ML injection 0.5 mg (0.5 mg Intravenous Given 10/27/19 2106)    ED Course  I have reviewed the triage vital signs and the nursing notes.  Pertinent labs & imaging results that were available during my care of the patient were reviewed by me and considered in my medical decision making (see chart for details).    MDM Rules/Calculators/A&P                      54  year old male presents to the ED for near syncope, fell striking head.  Found to be bradycardic and hypotensive.    Symptomatic bradycardia on arrival with blood pressures in  the 17C systolic.  Awake and alert but fatigued and ill-appearing.  Given atropine with improvement in heart rate and blood pressure.  Lab evaluation shows hyperkalemia which is the likely cause of his bradycardia.  Treated hyperkalemia as above with stabilization of vital signs.  Nephrology consulted and will take the patient emergently to dialysis.  Patient has a very superficial laceration to his occiput which will not require any repair.  It was irrigated by nursing staff and dressed.  Spoke with hospitalist who will admit to their service.  Final Clinical Impression(s) / ED Diagnoses 1.  Symptomatic bradycardia 2.  Hyperkalemia 3.  Hypotension    Rx / DC Orders ED Discharge Orders    None       Barney Russomanno, [place], MD 10/29/19 [id number]    Elnora Morrison, MD 10/29/19 [id number]

## 2019-10-27 NOTE — ED Notes (Signed)
This RN irrigated head laceration and Hunter MD told the pt staples were no longer needed and to wrap the pt's head.

## 2019-10-27 NOTE — ED Notes (Signed)
Dr Reather Converse informed of chem 8 and EG7 results

## 2019-10-27 NOTE — H&P (Addendum)
History and Physical    James Burke QQP:[id number] DOB: 01-31-1966 DOA: 10/27/2019  PCP: [name], MD  Patient coming from: Home  I have personally briefly reviewed patient's old medical records in [place]  Chief Complaint: Dizzy, fall  HPI: James Burke is a 54 y.o. male with medical history significant of ESRD MWF, DM2, HTN, reported cirrhosis with recurrent ascites requiring recurrent large volume paracentesis.  Last dialysis on Friday though they did sequental dialysis instead of the usual.  Completed full treatment.  Presents to ED after episode of dizziness, fell, hit head.  Found to have bradycardia in the 40s and BPs 71I-458K systolic.  Given atropine, calcium gluconate, etc with improvement in HR to 60 and BP improved as well.   ED Course: Nephro saw patient, planning on dialysis STAT tonight.   Review of Systems: As per HPI, otherwise all review of systems negative.  Past Medical History:  Diagnosis Date  . Anemia   . Arthritis, septic, knee ([place])   . C. difficile colitis 04/18/2008  . Congestive heart failure ([place])   . Diabetes mellitus    Type II  . DVT (deep venous thrombosis) (HCC)    right leg   . Dyspnea    "when I have too much fluid.'  . ESRD (end stage renal disease) ([place])    Tues, Thurs  . GERD (gastroesophageal reflux disease)   . History of blood transfusion 2017  . Pneumonia     Past Surgical History:  Procedure Laterality Date  . APPENDECTOMY  1995  . BASCILIC VEIN TRANSPOSITION Left 01/03/2017   Procedure: FIRST STAGE BRACHIAL VEIN TRANSPOSITION;  Surgeon: Conrad Lawrenceville, MD;  Location: [facility];  Service: Vascular;  Laterality: Left;  . BASCILIC VEIN TRANSPOSITION Left 04/24/2017   Procedure: LEFT SECOND STAGE BRACHIAL VEIN TRANSPOSITION;  Surgeon: Conrad Blackwood, MD;  Location: [facility];  Service: Vascular;  Laterality: Left;  . EYE SURGERY    . INSERTION OF DIALYSIS CATHETER Right 01/03/2017   Procedure: INSERTION OF  DIALYSIS CATHETER RIGHT INTERNAL JUGULAR;  Surgeon: Conrad Bassett, MD;  Location: [facility];  Service: Vascular;  Laterality: Right;  . IR PARACENTESIS  12/14/2017  . IR PARACENTESIS  02/01/2018  . IR PARACENTESIS  03/28/2018  . IR PARACENTESIS  05/17/2018  . IR PARACENTESIS  05/31/2018  . IR PARACENTESIS  06/12/2018  . IR PARACENTESIS  06/26/2018  . IR PARACENTESIS  07/10/2018  . IR PARACENTESIS  07/30/2018  . IR PARACENTESIS  08/30/2018  . IR PARACENTESIS  09/13/2018  . IR PARACENTESIS  09/27/2018  . IR PARACENTESIS  10/11/2018  . IR PARACENTESIS  10/30/2018  . IR PARACENTESIS  11/29/2018  . IR PARACENTESIS  01/22/2019  . IR PARACENTESIS  02/21/2019  . IR PARACENTESIS  03/21/2019  . IR PARACENTESIS  04/02/2019  . IR PARACENTESIS  04/18/2019  . IR PARACENTESIS  05/02/2019  . IR PARACENTESIS  05/16/2019  . IR PARACENTESIS  05/30/2019  . IR PARACENTESIS  06/14/2019  . IR PARACENTESIS  06/27/2019  . IR PARACENTESIS  07/11/2019  . IR PARACENTESIS  07/25/2019  . IR PARACENTESIS  08/06/2019  . IR PARACENTESIS  08/15/2019  . IR PARACENTESIS  08/29/2019  . IR PARACENTESIS  09/19/2019  . IR PARACENTESIS  10/04/2019  . KNEE SURGERY Left 04/2014  . [place]  Bilateral   . PERICARDIOCENTESIS N/A 01/03/2017   Procedure: Pericardiocentesis;  Surgeon: Burnell Blanks, MD;  Location: [facility];  Service: Cardiovascular;  Laterality: N/A;     reports that he has never smoked. He has quit using smokeless tobacco.  His smokeless tobacco use included chew. He reports that he does not drink alcohol or use drugs.  Allergies  Allergen Reactions  . Prednisone Other (See Comments)    BLINDNESS > [ ? CSCR ? ]  . Tape Other (See Comments)    Paper tape causes blisters   . Simvastatin Other (See Comments)    Myalgia, joint pain  . Lorazepam Anxiety and Other (See Comments)    Nervousness and "fidgets"; legs "need to kick, "electrical currents"  . Methocarbamol Diarrhea  . Seroquel [Quetiapine Fumarate] Other  (See Comments)    QT prolongation    Family History  Problem Relation Age of Onset  . Heart disease Other        No family history  . Cancer Brother      Prior to Admission medications   Medication Sig Start Date End Date Taking? Authorizing Provider  acetaminophen (TYLENOL) 500 MG tablet Take 1,000 mg by mouth See admin instructions. Take 2 tablets (1000 mg) by mouth during dialysis on Monday, Wednesday, Friday, take 2 tablets (1000 mg) daily at bedtime   Yes [provider]  amLODipine (NORVASC) 10 MG tablet TAKE 1 TABLET BY MOUTH EVERY DAY Patient taking differently: Take 10 mg by mouth at bedtime.  07/01/19  Yes [name], MD  aspirin EC 81 MG tablet Take 81 mg by mouth See admin instructions. Take one tablet (81 mg) by mouth every other night   Yes [provider]  b complex-vitamin c-folic acid (NEPHRO-VITE) 0.8 MG TABS tablet Take 1 tablet by mouth at bedtime.  03/12/18  Yes [provider]  carvedilol (COREG) 25 MG tablet TAKE 1 TABLET TWICE A DAY WITH FOOD Patient taking differently: Take 25 mg by mouth in the morning and at bedtime.  09/16/19  Yes [name], MD  diphenhydrAMINE (BENADRYL) 25 MG tablet Take 25 mg by mouth See admin instructions. Take 2 tablets (50 mg) by mouth during dialysis on Monday, Wednesday, Friday, take 1-2 tablets (25-50 mg) daily at bedtime   Yes [provider]  ferric citrate (AURYXIA) 1 GM 210 MG(Fe) tablet Take 210-420 mg by mouth See admin instructions. Take 2 tablets (420 mg) by mouth three times daily with meals, take 1 tablet (210 mg) with snacks   Yes [provider]  furosemide (LASIX) 80 MG tablet TAKE 1 TABLET (80 MG TOTAL) BY MOUTH 2 (TWO) TIMES DAILY. Patient taking differently: Take 80-160 mg by mouth See admin instructions. Take 2 tablets (160 mg) by mouth twice daily, may take an additional 1 or 2 tablets (80-160 mg) as needed for bloating 09/23/19  Yes [name],  MD  gabapentin (NEURONTIN) 100 MG capsule Take 2 capsules (200 mg total) by mouth 2 (two) times daily as needed (Give one hour prior to hemodialysis session as needed for anxiety/restlessness). Patient taking differently: Take 100-200 mg by mouth See admin instructions. On Monday, Wednesday, Friday (dialysis days) - take 2 capsules (200 mg) by mouth before dialysis and take 1 capsule (100 mg) at bedtime; on Sunday, Tuesday, Thursday, Saturday (non-dialysis days) - take 1 capsule (100 mg) twice daily 03/26/19 02/03/20 Yes [name], MD  hydrALAZINE (APRESOLINE) 25 MG tablet TAKE 1 TABLET (25 MG TOTAL) 3 (THREE) TIMES DAILY BY MOUTH. Patient taking differently: Take 25 mg by mouth in the morning and at bedtime.  02/13/18  Yes  [name], MD  hydrOXYzine (ATARAX/VISTARIL) 25 MG tablet Take 25 mg by mouth 2 (two) times daily.  08/06/19  Yes [provider]  isosorbide mononitrate (IMDUR) 30 MG 24 hr tablet Take 1 tablet (30 mg total) by mouth daily. Patient taking differently: Take 30 mg by mouth at bedtime.  07/09/19  Yes [name], MD  lidocaine-prilocaine (EMLA) cream APPLY THREE TIMES A WEEK AS DIRECTED 1 HOUR PRIOR TO DIALYSIS AND WRAP WITH PLASTIC WRAP Patient taking differently: Apply 1 application topically See admin instructions. Apply topically one hour before dialysis on Monday, Wednesday, Friday (cover with plastic wrap) 09/02/19  Yes [name], MD  omeprazole (PRILOSEC) 40 MG capsule TAKE 1 CAPSULE BY MOUTH EVERY DAY Patient taking differently: Take 40 mg by mouth daily.  09/16/19  Yes [name], MD  oxyCODONE-acetaminophen (PERCOCET) 10-325 MG tablet Take 1 tablet by mouth every 4 (four) hours as needed for pain. Patient taking differently: Take 1 tablet by mouth See admin instructions. Take 1 tablet by mouth after dialysis on Monday, Wednesday, Friday, take 1 tablet daily at bedtime, may also take 1 tablet at 5 pm as needed for  pain 10/22/19  Yes [name], MD  polyvinyl alcohol (ARTIFICIAL TEARS) 1.4 % ophthalmic solution Place 1 drop into both eyes at bedtime as needed for dry eyes (itching).   Yes [provider]  rOPINIRole (REQUIP) 0.25 MG tablet TAKE 1 TO 2 TABLETS AT BEDTIME Patient taking differently: Take 0.5 mg by mouth See admin instructions. Take 2 tablets (0.5 mg) by mouth before dialysis on Monday, Wednesday, Friday, take 2 tablets (0.5 mg) daily at bedtime 07/01/19  Yes [name], MD  tamsulosin (FLOMAX) 0.4 MG CAPS capsule TAKE 2 CAPSULES BY MOUTH DAILY AFTER SUPPER Patient taking differently: Take 0.8 mg by mouth at bedtime.  09/16/19  Yes [name], MD  traZODone (DESYREL) 50 MG tablet TAKE 1 TABLET AT BEDTIME Patient taking differently: Take 50 mg by mouth at bedtime.  09/02/19  Yes [name], MD  TRULICITY 8.29 HB/7.1IR SOPN INJECT 0.75MG  EVERY 7 DAYS Patient taking differently: Inject 0.75 mg into the skin every Saturday.  01/17/19  Yes [name], MD  Vitamin D, Ergocalciferol, (DRISDOL) 1.25 MG (50000 UNIT) CAPS capsule Take 50,000 Units by mouth every 7 (seven) days.    [provider]    Physical Exam: Vitals:   10/27/19 2150 10/27/19 2201 10/27/19 2203 10/27/19 2211  BP: 110/73 122/68    Pulse: 61 63 63 63  Resp: 17 20 17 16   Temp:      TempSrc:      SpO2: 99% 98% 97% 96%  Weight:      Height:        Constitutional: NAD, calm, comfortable Eyes: PERRL, lids and conjunctivae normal ENMT: Mucous membranes are moist. Posterior pharynx clear of any exudate or lesions.Normal dentition.  Neck: normal, supple, no masses, no thyromegaly Respiratory: clear to auscultation bilaterally, no wheezing, no crackles. Normal respiratory effort. No accessory muscle use.  Cardiovascular: Regular rate and rhythm, no murmurs / rubs / gallops. No extremity edema. 2+ pedal pulses. No carotid bruits.  Abdomen: no tenderness, no masses  palpated. No hepatosplenomegaly. Bowel sounds positive.  Musculoskeletal: Charcot foot in boot, no ulcer per patient Skin: no rashes, lesions, ulcers. No induration Neurologic: CN 2-12 grossly intact. Sensation intact, DTR normal. Strength 5/5 in all 4.  Psychiatric: Normal judgment and insight. Alert and oriented x 3.  Normal mood.    Labs on Admission: I have personally reviewed following labs and imaging studies  CBC: Recent Labs  Lab 10/27/19 2031 10/27/19 2057  WBC 9.0  --   NEUTROABS 6.8  --   HGB 10.2* 10.9*  10.5*  HCT 32.9* 32.0*  31.0*  MCV 92.4  --   PLT 231  --    Basic Metabolic Panel: Recent Labs  Lab 10/27/19 2031 10/27/19 2057  NA 134* 135  134*  K 6.9* 6.8*  6.8*  CL 94* 101  CO2 21*  --   GLUCOSE 155* 145*  BUN 84* 93*  CREATININE 11.21* 12.10*  CALCIUM 8.8*  --   MG 2.5*  --    GFR: Estimated Creatinine Clearance: 8.8 mL/min (A) (by C-G formula based on SCr of 12.1 mg/dL (H)). Liver Function Tests: Recent Labs  Lab 10/27/19 2031  AST 18  ALT 17  ALKPHOS 69  BILITOT 0.8  PROT 7.6  ALBUMIN 3.6   No results for input(s): LIPASE, AMYLASE in the last 168 hours. No results for input(s): AMMONIA in the last 168 hours. Coagulation Profile: No results for input(s): INR, PROTIME in the last 168 hours. Cardiac Enzymes: No results for input(s): CKTOTAL, CKMB, CKMBINDEX, TROPONINI in the last 168 hours. BNP (last 3 results) No results for input(s): PROBNP in the last 8760 hours. HbA1C: No results for input(s): HGBA1C in the last 72 hours. CBG: Recent Labs  Lab 10/27/19 2222  GLUCAP 99   Lipid Profile: No results for input(s): CHOL, HDL, LDLCALC, TRIG, CHOLHDL, LDLDIRECT in the last 72 hours. Thyroid Function Tests: Recent Labs    10/27/19 2041  TSH 4.260   Anemia Panel: No results for input(s): VITAMINB12, FOLATE, FERRITIN, TIBC, IRON, RETICCTPCT in the last 72 hours. Urine analysis:    Component Value Date/Time   COLORURINE  YELLOW 07/25/2017 [place] 07/25/2017 1821   LABSPEC 1.021 07/25/2017 1821   PHURINE 5.0 07/25/2017 1821   GLUCOSEU NEGATIVE 07/25/2017 1821   HGBUR SMALL (A) 07/25/2017 1821   BILIRUBINUR NEGATIVE 07/25/2017 1821   BILIRUBINUR negative 10/24/2014 [place] 07/25/2017 1821   PROTEINUR 100 (A) 07/25/2017 1821   UROBILINOGEN 0.2 10/24/2014 1659   NITRITE NEGATIVE 07/25/2017 1821   LEUKOCYTESUR TRACE (A) 07/25/2017 1821    Radiological Exams on Admission: CT Head Wo Contrast  Result Date: 10/27/2019 CLINICAL DATA:  Head trauma and headache EXAM: CT HEAD WITHOUT CONTRAST TECHNIQUE: Contiguous axial images were obtained from the base of the skull through the vertex without intravenous contrast. COMPARISON:  None. FINDINGS: Brain: No evidence of acute infarction, hemorrhage, hydrocephalus, extra-axial collection or mass lesion/mass effect. Vascular: No hyperdense vessel or unexpected calcification. Skull: No skull fracture. Multifocal soft tissue gas within the right masticator and parotid spaces. Right parietal scalp hematoma. Sinuses/Orbits: No acute finding. Other: None. IMPRESSION: 1. No acute intracranial abnormality. 2. Right parietal scalp hematoma.  No skull fracture. 3. Multifocal soft tissue gas within the right masticator and parotid spaces. This could be a small amount of venous gas related IV infusion, though soft tissue gas related to a paranasal sinus fracture could also cause this appearance. The visualized sinuses are normal, but if there is concern for infraorbital facial fracture, maxillofacial CT should be considered. Electronically Signed   By: Ulyses Jarred M.D.   On: 10/27/2019 22:23   DG Chest Portable 1 View  Result Date: 10/27/2019 CLINICAL DATA:  Hypotension, dizziness, fell EXAM: PORTABLE CHEST 1 VIEW COMPARISON:  11/20/2018 FINDINGS: Single frontal view of the chest demonstrates stable enlargement of the cardiac silhouette. No airspace  disease, effusion, or pneumothorax. No acute bony abnormalities. External defibrillator pad overlies left chest. IMPRESSION: 1. Stable enlarged cardiac silhouette. 2. No acute airspace disease. Electronically Signed   By: Randa Ngo M.D.   On: 10/27/2019 21:09   DG Hip Unilat W or Wo Pelvis 2-3 Views Right  Result Date: 10/27/2019 CLINICAL DATA:  Hypertension EXAM: DG HIP (WITH OR WITHOUT PELVIS) 2-3V RIGHT COMPARISON:  None. FINDINGS: Bones of the pelvis are intact and congruent. Proximal femora are normally located and intact. Slight cam deformity of the right and femoral head neck. Mild degenerative changes in both hips. Surgical clips project in the right lower quadrant. Phleboliths and vascular calcium noted in the pelvis. Soft tissues are otherwise unremarkable. IMPRESSION: No acute fracture or traumatic malalignment. Mild cam type deformities of the femoral head neck junctions, could correlate for symptoms of femoroacetabular impingement. Electronically Signed   By: Lovena Le M.D.   On: 10/27/2019 22:16    EKG: Independently reviewed.  Assessment/Plan Principal Problem:   Hyperkalemia, diminished renal excretion Active Problems:   Chronic systolic CHF (congestive heart failure), NYHA class 1 (HCC)   Essential hypertension, benign   Charcot foot due to diabetes mellitus ([place])   ESRD (end stage renal disease) on dialysis ([place])   Type 2 diabetes mellitus with diabetic chronic kidney disease (HCC)   Symptomatic bradycardia   Ascites    1. Hyperkalemia in setting of ESRD causing symptomatic bradycardia - 1. Improved post temporary measures 2. Going to dialysis STAT 3. Tele monitor 4. Repeat BMP in AM 2. HTN - 1. Holding home BP meds for the moment 2. Consider resuming some in AM if depending on how he does overnight from dialysis. 3. Recurrent ascites - 1. Looks like patient getting fairly regular paracentesis since 2019 for ascites reportedly due to cirrhosis. 2. Next  scheduled paracentesis is Tues 3. However, I dont actually see where cirrhosis has been diagnosed or worked up in patient chart, and he has some odd lab findings for cirrhosis: 1. Last CT abd/pelvis in 2018 showed normal liver 2. Last INR normal, will check INR today 3. Albumin today is normal, and looks like it usually runs normal as is total protein 4. Platelets normal 5. T.bili normal 6. LFTs normal 4. Might want to look into this in more detail after we resolve issue #1 overnight and stabilize patient. 4. DM2 - 1. Continue Trulicity 2. CBG checks AC/HS  DVT prophylaxis: Heparin [place] Code Status: Full Family Communication: No family in room Disposition Plan: Home after admit Consults called: Nephrology Admission status: Place in 82   Andren Bethea, [place] [facility]  How to contact the [facility] Attending or Consulting provider [place] or covering provider during after hours [place], for this patient?  1. Check the care team in [facility] and look for a) attending/consulting [facility] provider listed and b) the [facility] team listed 2. Log into [email or web address]  Amion Physician Scheduling and messaging for groups and whole hospitals  On call and physician scheduling software for group practices, residents, hospitalists and other medical providers for call, clinic, rotation and shift schedules. On[employer] is a hospital-wide system for scheduling doctors and paging doctors on call. EasyPlot is for scientific plotting and data analysis.  [email or web address]  and use [facility]'s universal password to access. If you do not have the password, please contact the hospital operator.  3.  Locate the [facility] provider you are looking for under [facility] and page to a number that you can be directly reached. 4. If you still have difficulty reaching the provider, please page the [facility] (Director on Call) for the Hospitalists listed on amion for assistance.  10/27/2019, 11:26 PM

## 2019-10-27 NOTE — ED Notes (Signed)
Pt wife updated on status.

## 2019-10-27 NOTE — ED Notes (Signed)
Pt is MWF dialysis, has a paracentesis scheduled for Tuesday.

## 2019-10-28 ENCOUNTER — Observation Stay (HOSPITAL_COMMUNITY): Payer: Medicare Other

## 2019-10-28 DIAGNOSIS — I361 Nonrheumatic tricuspid (valve) insufficiency: Secondary | ICD-10-CM

## 2019-10-28 DIAGNOSIS — I5022 Chronic systolic (congestive) heart failure: Secondary | ICD-10-CM | POA: Diagnosis not present

## 2019-10-28 DIAGNOSIS — I472 Ventricular tachycardia: Secondary | ICD-10-CM | POA: Diagnosis present

## 2019-10-28 DIAGNOSIS — Z888 Allergy status to other drugs, medicaments and biological substances status: Secondary | ICD-10-CM | POA: Diagnosis not present

## 2019-10-28 DIAGNOSIS — G8929 Other chronic pain: Secondary | ICD-10-CM | POA: Diagnosis present

## 2019-10-28 DIAGNOSIS — Z7982 Long term (current) use of aspirin: Secondary | ICD-10-CM | POA: Diagnosis not present

## 2019-10-28 DIAGNOSIS — Z91048 Other nonmedicinal substance allergy status: Secondary | ICD-10-CM | POA: Diagnosis not present

## 2019-10-28 DIAGNOSIS — Z86718 Personal history of other venous thrombosis and embolism: Secondary | ICD-10-CM | POA: Diagnosis not present

## 2019-10-28 DIAGNOSIS — E1122 Type 2 diabetes mellitus with diabetic chronic kidney disease: Secondary | ICD-10-CM | POA: Diagnosis present

## 2019-10-28 DIAGNOSIS — I428 Other cardiomyopathies: Secondary | ICD-10-CM | POA: Diagnosis present

## 2019-10-28 DIAGNOSIS — R188 Other ascites: Secondary | ICD-10-CM | POA: Diagnosis present

## 2019-10-28 DIAGNOSIS — E11610 Type 2 diabetes mellitus with diabetic neuropathic arthropathy: Secondary | ICD-10-CM

## 2019-10-28 DIAGNOSIS — M25562 Pain in left knee: Secondary | ICD-10-CM | POA: Diagnosis present

## 2019-10-28 DIAGNOSIS — W19XXXA Unspecified fall, initial encounter: Secondary | ICD-10-CM | POA: Diagnosis present

## 2019-10-28 DIAGNOSIS — I5042 Chronic combined systolic (congestive) and diastolic (congestive) heart failure: Secondary | ICD-10-CM | POA: Diagnosis present

## 2019-10-28 DIAGNOSIS — N186 End stage renal disease: Secondary | ICD-10-CM | POA: Diagnosis present

## 2019-10-28 DIAGNOSIS — N2581 Secondary hyperparathyroidism of renal origin: Secondary | ICD-10-CM | POA: Diagnosis present

## 2019-10-28 DIAGNOSIS — S0003XA Contusion of scalp, initial encounter: Secondary | ICD-10-CM | POA: Diagnosis present

## 2019-10-28 DIAGNOSIS — E875 Hyperkalemia: Secondary | ICD-10-CM | POA: Diagnosis present

## 2019-10-28 DIAGNOSIS — M278 Other specified diseases of jaws: Secondary | ICD-10-CM | POA: Diagnosis present

## 2019-10-28 DIAGNOSIS — D631 Anemia in chronic kidney disease: Secondary | ICD-10-CM | POA: Diagnosis present

## 2019-10-28 DIAGNOSIS — M25561 Pain in right knee: Secondary | ICD-10-CM | POA: Diagnosis present

## 2019-10-28 DIAGNOSIS — K219 Gastro-esophageal reflux disease without esophagitis: Secondary | ICD-10-CM | POA: Diagnosis present

## 2019-10-28 DIAGNOSIS — I132 Hypertensive heart and chronic kidney disease with heart failure and with stage 5 chronic kidney disease, or end stage renal disease: Secondary | ICD-10-CM | POA: Diagnosis present

## 2019-10-28 DIAGNOSIS — Z20822 Contact with and (suspected) exposure to covid-19: Secondary | ICD-10-CM | POA: Diagnosis present

## 2019-10-28 DIAGNOSIS — R001 Bradycardia, unspecified: Secondary | ICD-10-CM | POA: Diagnosis present

## 2019-10-28 DIAGNOSIS — Z7984 Long term (current) use of oral hypoglycemic drugs: Secondary | ICD-10-CM | POA: Diagnosis not present

## 2019-10-28 HISTORY — PX: IR PARACENTESIS: IMG2679

## 2019-10-28 LAB — CBC WITH DIFFERENTIAL/PLATELET
Abs Immature Granulocytes: 0.1 10*3/uL — ABNORMAL HIGH (ref 0.00–0.07)
Basophils Absolute: 0.1 10*3/uL (ref 0.0–0.1)
Basophils Relative: 1 %
Eosinophils Absolute: 0.2 10*3/uL (ref 0.0–0.5)
Eosinophils Relative: 3 %
HCT: 26.6 % — ABNORMAL LOW (ref 39.0–52.0)
Hemoglobin: 8.7 g/dL — ABNORMAL LOW (ref 13.0–17.0)
Immature Granulocytes: 1 %
Lymphocytes Relative: 8 %
Lymphs Abs: 0.7 10*3/uL (ref 0.7–4.0)
MCH: 29 pg (ref 26.0–34.0)
MCHC: 32.7 g/dL (ref 30.0–36.0)
MCV: 88.7 fL (ref 80.0–100.0)
Monocytes Absolute: 1 10*3/uL (ref 0.1–1.0)
Monocytes Relative: 11 %
Neutro Abs: 6.7 10*3/uL (ref 1.7–7.7)
Neutrophils Relative %: 76 %
Platelets: 193 10*3/uL (ref 150–400)
RBC: 3 MIL/uL — ABNORMAL LOW (ref 4.22–5.81)
RDW: 14.6 % (ref 11.5–15.5)
WBC: 8.8 10*3/uL (ref 4.0–10.5)
nRBC: 0 % (ref 0.0–0.2)

## 2019-10-28 LAB — GLUCOSE, CAPILLARY
Glucose-Capillary: 171 mg/dL — ABNORMAL HIGH (ref 70–99)
Glucose-Capillary: 139 mg/dL — ABNORMAL HIGH (ref 70–99)
Glucose-Capillary: 150 mg/dL — ABNORMAL HIGH (ref 70–99)
Glucose-Capillary: 209 mg/dL — ABNORMAL HIGH (ref 70–99)

## 2019-10-28 LAB — BASIC METABOLIC PANEL
Anion gap: 19 — ABNORMAL HIGH (ref 5–15)
Anion gap: 17 — ABNORMAL HIGH (ref 5–15)
BUN: 87 mg/dL — ABNORMAL HIGH (ref 6–20)
BUN: 45 mg/dL — ABNORMAL HIGH (ref 6–20)
CO2: 21 mmol/L — ABNORMAL LOW (ref 22–32)
CO2: 25 mmol/L (ref 22–32)
Calcium: 8.7 mg/dL — ABNORMAL LOW (ref 8.9–10.3)
Calcium: 8.2 mg/dL — ABNORMAL LOW (ref 8.9–10.3)
Chloride: 96 mmol/L — ABNORMAL LOW (ref 98–111)
Chloride: 96 mmol/L — ABNORMAL LOW (ref 98–111)
Creatinine, Ser: 10.91 mg/dL — ABNORMAL HIGH (ref 0.61–1.24)
Creatinine, Ser: 6.91 mg/dL — ABNORMAL HIGH (ref 0.61–1.24)
GFR calc Af Amer: 6 mL/min — ABNORMAL LOW (ref >60)
GFR calc Af Amer: 10 mL/min — ABNORMAL LOW (ref >60)
GFR calc non Af Amer: 5 mL/min — ABNORMAL LOW (ref >60)
GFR calc non Af Amer: 8 mL/min — ABNORMAL LOW (ref >60)
Glucose, Bld: 122 mg/dL — ABNORMAL HIGH (ref 70–99)
Glucose, Bld: 155 mg/dL — ABNORMAL HIGH (ref 70–99)
Potassium: 6.9 mmol/L (ref 3.5–5.1)
Potassium: 4.4 mmol/L (ref 3.5–5.1)
Sodium: 136 mmol/L (ref 135–145)
Sodium: 138 mmol/L (ref 135–145)

## 2019-10-28 LAB — PROTIME-INR
INR: 1.3 — ABNORMAL HIGH (ref 0.8–1.2)
Prothrombin Time: 16 seconds — ABNORMAL HIGH (ref 11.4–15.2)

## 2019-10-28 LAB — ECHOCARDIOGRAM COMPLETE
Height: 76 in
Weight: 3120 oz

## 2019-10-28 LAB — TROPONIN I (HIGH SENSITIVITY): Troponin I (High Sensitivity): 19 ng/L — ABNORMAL HIGH (ref <18)

## 2019-10-28 LAB — SARS CORONAVIRUS 2 (TAT 6-24 HRS): SARS Coronavirus 2: NEGATIVE

## 2019-10-28 LAB — LACTIC ACID, PLASMA: Lactic Acid, Venous: 0.9 mmol/L (ref 0.5–1.9)

## 2019-10-28 MED ORDER — OXYCODONE HCL 5 MG PO TABS
5.0000 mg | ORAL_TABLET | Freq: Once | ORAL | Status: AC
  Administered 2019-10-28: 5 mg via ORAL
  Filled 2019-10-28: qty 1

## 2019-10-28 MED ORDER — HYDRALAZINE HCL 25 MG PO TABS
25.0000 mg | ORAL_TABLET | Freq: Two times a day (BID) | ORAL | Status: DC
  Administered 2019-10-28 – 2019-10-29 (×3): 25 mg via ORAL
  Filled 2019-10-28 (×3): qty 1

## 2019-10-28 MED ORDER — TAMSULOSIN HCL 0.4 MG PO CAPS
0.8000 mg | ORAL_CAPSULE | Freq: Every day | ORAL | Status: DC
  Administered 2019-10-28: 0.8 mg via ORAL
  Filled 2019-10-28: qty 2

## 2019-10-28 MED ORDER — LIDOCAINE HCL 1 % IJ SOLN
INTRAMUSCULAR | Status: DC | PRN
  Administered 2019-10-28: 10 mL

## 2019-10-28 MED ORDER — FUROSEMIDE 80 MG PO TABS
80.0000 mg | ORAL_TABLET | Freq: Two times a day (BID) | ORAL | Status: DC
  Administered 2019-10-28 – 2019-10-29 (×3): 80 mg via ORAL
  Filled 2019-10-28 (×3): qty 1

## 2019-10-28 MED ORDER — OXYCODONE-ACETAMINOPHEN 5-325 MG PO TABS
1.0000 | ORAL_TABLET | Freq: Every day | ORAL | Status: DC | PRN
  Administered 2019-10-28 – 2019-10-29 (×2): 1 via ORAL
  Filled 2019-10-28 (×3): qty 1

## 2019-10-28 MED ORDER — PERFLUTREN LIPID MICROSPHERE
1.0000 mL | INTRAVENOUS | Status: AC | PRN
  Administered 2019-10-28: 2 mL via INTRAVENOUS
  Filled 2019-10-28: qty 10

## 2019-10-28 MED ORDER — OXYCODONE HCL 5 MG PO TABS
5.0000 mg | ORAL_TABLET | Freq: Three times a day (TID) | ORAL | Status: DC | PRN
  Administered 2019-10-28 – 2019-10-29 (×3): 5 mg via ORAL
  Filled 2019-10-28 (×3): qty 1

## 2019-10-28 MED ORDER — FERRIC CITRATE 1 GM 210 MG(FE) PO TABS: 210.0000 mg | ORAL_TABLET | ORAL | Status: DC | PRN

## 2019-10-28 MED ORDER — FERRIC CITRATE 1 GM 210 MG(FE) PO TABS
420.0000 mg | ORAL_TABLET | Freq: Three times a day (TID) | ORAL | Status: DC
  Administered 2019-10-28 – 2019-10-29 (×5): 420 mg via ORAL
  Filled 2019-10-28 (×5): qty 2

## 2019-10-28 MED ORDER — INSULIN ASPART 100 UNIT/ML ~~LOC~~ SOLN: 0.0000 [IU] | Freq: Three times a day (TID) | SUBCUTANEOUS | Status: DC

## 2019-10-28 MED ORDER — ROPINIROLE HCL 1 MG PO TABS
0.5000 mg | ORAL_TABLET | Freq: Every day | ORAL | Status: DC
  Administered 2019-10-28: 0.5 mg via ORAL
  Filled 2019-10-28: qty 1

## 2019-10-28 MED ORDER — LIDOCAINE HCL 1 % IJ SOLN
INTRAMUSCULAR | Status: AC
  Filled 2019-10-28: qty 20

## 2019-10-28 MED ORDER — ROPINIROLE HCL 1 MG PO TABS
0.5000 mg | ORAL_TABLET | ORAL | Status: DC
  Filled 2019-10-28 (×2): qty 1

## 2019-10-28 NOTE — Progress Notes (Addendum)
Patient had 16 beat runs of V-tach. Patient asymptomatic. VSS. Ardith Dark, NP paged. No new orders received. Will continue to monitor.

## 2019-10-28 NOTE — Procedures (Signed)
PROCEDURE SUMMARY:  Successful US guided paracentesis from right lateral abdomen.  Yielded 5.5 liters of clear yellow fluid.  No immediate complications.  Patient tolerated well.  EBL = trace   Kimbery Harwood S Daymian Lill PA-C 10/28/2019 2:07 PM

## 2019-10-28 NOTE — Progress Notes (Signed)
Pt c/o pain at 9/10 in his ribs from fall. Also complains of a headache. Pt does have a laceration on back of head that is wrapped with gauze and coban. Pt states head is about a 6/10. Pt is requesting oxy. However directions say once daily and before dialysis. RN messaged NP on call to see if we can get an order. Pt states he takes oxy at home every four hours as needed. RN verified this on home med list. Pt does state that most of the time he doesn't need it except before bed and before HD. RN awaiting response.   Eleanora Neighbor, RN

## 2019-10-28 NOTE — Progress Notes (Signed)
New Admission Note:  Arrival Method: By stretcher from ED around 0530 Mental Orientation: Alert and oriented x 4  Telemetry: Box 20, CCMD notified Assessment: Completed Skin: Completed, laceration to the back of head that is wrapped in gauze and coban. Scratch marks on hands from dog per patient. Pt also has a R charcot foot and has a boot on it. Pt did not want to remove Left shoe. Pt states he does not have any open wounds on feet.   IV: Right forearm S.L.  Pain: 9/10 rib pain and 6/10 head pain Tubes: None Safety Measures: Safety Fall Prevention Plan was given, discussed  Admission: Completed [facility] [place] Orientation: Patient has been orientated to the room, unit and the staff. Family: None  Orders have been reviewed and implemented. Will continue to monitor the patient. Call light has been placed within reach and bed alarm has been activated. Pt was placed on a low bed due to admitted as a result of a fall.   Perry Mount, RN  Phone Number: [phone]

## 2019-10-28 NOTE — Progress Notes (Signed)
  Echocardiogram 2D Echocardiogram has been performed.  James Burke A James Burke 10/28/2019, 4:03 PM

## 2019-10-28 NOTE — ED Notes (Signed)
Pt transported to HD bay 3 by this RN. Pt conscious, breathing, and A&Ox4. No distress noted.

## 2019-10-28 NOTE — ED Notes (Signed)
Pt resting in bed. Pt denies new or worsening complaints. Will continue to monitor. No distress noted. Pt on continuous monitoring via blood pressure, pulse ox, and cardiac monitor.  

## 2019-10-28 NOTE — Progress Notes (Signed)
[facility] Progress Note   Subjective:  Seen in room. Headache - head bandaged. Was dialyzed overnight d/t hyperkalemia - repeat labs pending. Tells me will be staying thru tomorrow for prev scheduled paracentesis. No CP or dyspnea.   Objective Vitals:   10/27/19 2201 10/27/19 2203 10/27/19 2211 10/28/19 0610  BP: 122/68   138/77  Pulse: 63 63 63 86  Resp: 20 17 16    Temp:    98.7 F (37.1 C)  TempSrc:    Oral  SpO2: 98% 97% 96% 96%  Weight:      Height:       Physical Exam General: Well appearing man, NAD. Head bandaged. Heart: RRR; no murmur Lungs: CTAB Abdomen: soft, but distended Extremities: No LE edema Dialysis Access:  AVF + thrill  Additional Objective Labs: Basic Metabolic Panel: Recent Labs  Lab 10/27/19 2031 10/27/19 2057 10/28/19 0208  NA 134* 135  134* 136  K 6.9* 6.8*  6.8* 6.9*  CL 94* 101 96*  CO2 21*  --  21*  GLUCOSE 155* 145* 122*  BUN 84* 93* 87*  CREATININE 11.21* 12.10* 10.91*  CALCIUM 8.8*  --  8.7*   Liver Function Tests: Recent Labs  Lab 10/27/19 2031  AST 18  ALT 17  ALKPHOS 69  BILITOT 0.8  PROT 7.6  ALBUMIN 3.6   CBC: Recent Labs  Lab 10/27/19 2031 10/27/19 2057 10/28/19 1011  WBC 9.0  --  8.8  NEUTROABS 6.8  --  6.7  HGB 10.2* 10.9*  10.5* 8.7*  HCT 32.9* 32.0*  31.0* 26.6*  MCV 92.4  --  88.7  PLT 231  --  193   Studies/Results: CT Head Wo Contrast  Result Date: 10/27/2019 CLINICAL DATA:  Head trauma and headache EXAM: CT HEAD WITHOUT CONTRAST TECHNIQUE: Contiguous axial images were obtained from the base of the skull through the vertex without intravenous contrast. COMPARISON:  None. FINDINGS: Brain: No evidence of acute infarction, hemorrhage, hydrocephalus, extra-axial collection or mass lesion/mass effect. Vascular: No hyperdense vessel or unexpected calcification. Skull: No skull fracture. Multifocal soft tissue gas within the right masticator and parotid spaces. Right parietal scalp  hematoma. Sinuses/Orbits: No acute finding. Other: None. IMPRESSION: 1. No acute intracranial abnormality. 2. Right parietal scalp hematoma.  No skull fracture. 3. Multifocal soft tissue gas within the right masticator and parotid spaces. This could be a small amount of venous gas related IV infusion, though soft tissue gas related to a paranasal sinus fracture could also cause this appearance. The visualized sinuses are normal, but if there is concern for infraorbital facial fracture, maxillofacial CT should be considered. Electronically Signed   By: Ulyses Jarred M.D.   On: 10/27/2019 22:23   DG Chest Portable 1 View  Result Date: 10/27/2019 CLINICAL DATA:  Hypotension, dizziness, fell EXAM: PORTABLE CHEST 1 VIEW COMPARISON:  11/20/2018 FINDINGS: Single frontal view of the chest demonstrates stable enlargement of the cardiac silhouette. No airspace disease, effusion, or pneumothorax. No acute bony abnormalities. External defibrillator pad overlies left chest. IMPRESSION: 1. Stable enlarged cardiac silhouette. 2. No acute airspace disease. Electronically Signed   By: Randa Ngo M.D.   On: 10/27/2019 21:09   DG Hip Unilat W or Wo Pelvis 2-3 Views Right  Result Date: 10/27/2019 CLINICAL DATA:  Hypertension EXAM: DG HIP (WITH OR WITHOUT PELVIS) 2-3V RIGHT COMPARISON:  None. FINDINGS: Bones of the pelvis are intact and congruent. Proximal femora are normally located and intact. Slight cam deformity of the right and femoral  head neck. Mild degenerative changes in both hips. Surgical clips project in the right lower quadrant. Phleboliths and vascular calcium noted in the pelvis. Soft tissues are otherwise unremarkable. IMPRESSION: No acute fracture or traumatic malalignment. Mild cam type deformities of the femoral head neck junctions, could correlate for symptoms of femoroacetabular impingement. Electronically Signed   By: Lovena Le M.D.   On: 10/27/2019 22:16   CT MAXILLOFACIAL WO CONTRAST  Result  Date: 10/28/2019 CLINICAL DATA:  Fall with head trauma. Abnormal head CT, rule out facial fracture EXAM: CT MAXILLOFACIAL WITHOUT CONTRAST TECHNIQUE: Multidetector CT imaging of the maxillofacial structures was performed. Multiplanar CT image reconstructions were also generated. COMPARISON:  None. FINDINGS: Osseous: Negative for fracture. There is a ground-glass lesion with mild expansion at the angle of the right mandible measuring 13 mm. Normal mandibular location. Orbits: Negative.  No evidence of injury. Sinuses: Clear.  No hemosinus. Soft tissues: Resolved soft tissue gas which was intravenous in location. Limited intracranial: Negative. IMPRESSION: 1. Negative for facial fracture. Soft tissue gas on prior is resolved and was intravenous. 2. 13 mm ground-glass lesion in the right mandible. 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 Please correlate for any associated pain. Electronically Signed   By: Monte Fantasia M.D.   On: 10/28/2019 09:19   Medications:  . aspirin EC  81 mg Oral Q48H  . Chlorhexidine Gluconate Cloth  6 each Topical Q0600  . diphenhydrAMINE  25-50 mg Oral QHS  . diphenhydrAMINE  50 mg Oral Q M,W,F-HD  . Dulaglutide  0.75 mg Subcutaneous Q Sat  . ferric citrate  420 mg Oral TID WC  . furosemide  80 mg Oral BID  . gabapentin  100 mg Oral QHS  . [START ON 10/29/2019] gabapentin  100 mg Oral Q T,Th,S,Su  . gabapentin  200 mg Oral Q M,W,F  . heparin  5,000 Units Subcutaneous Q8H  . hydrALAZINE  25 mg Oral BID  . hydrOXYzine  25 mg Oral BID  . lidocaine-EPINEPHrine  20 mL Intradermal Once  . multivitamin  1 tablet Oral QHS  . oxyCODONE-acetaminophen  1 tablet Oral Q M,W,F-HD   And  . oxyCODONE  5 mg Oral Q M,W,F-HD  . oxyCODONE-acetaminophen  1 tablet Oral QHS   And  . oxyCODONE  5 mg Oral QHS  . pantoprazole  80 mg Oral Daily  . rOPINIRole  0.5 mg Oral Q M,W,F  . rOPINIRole  0.5 mg Oral QHS  . tamsulosin  0.8 mg Oral QHS  .  traZODone  50 mg Oral QHS    Dialysis Orders: MWF at [facility], 450/800, EDW 88kg, 2K/2Ca, AVF, no heparin  Assessment/Plan: 1. Bradycardia/dizziness: In setting of #2. Required atropine. Improved now. 2. Hyperkalemia with EKG changes: S/p medical treatment followed by urgent HD. 3. ESRD: Usual MWF sched - s/p HD overnight. Next HD on 2/24 unless high K recurs before. 4. HTN/volume: BP stable 5. Anemia: Hgb 8.7- significant drop - follow. 6. Secondary hyperparathyroidism: Ca ok, continue home binders Lorin Picket) 7. ?Cirrhosis with recurrent large volume paracentesis: Already sched for next on 2/23. 8. Head wound: S/p fall - bandaged.   Veneta Penton, PA-C 10/28/2019, 11:22 AM  [facility] Pager: [phone]

## 2019-10-28 NOTE — Progress Notes (Signed)
PROGRESS NOTE  James Burke URK:[id number] DOB: [birth year] DOA: 10/27/2019 PCP: [name], MD  HPI/Recap of past 24 hours: HPI from Dr James Burke is a 54 y.o. male with medical history significant of ESRD MWF, DM2, HTN, CHF, recurrent ascites requiring recurrent large volume paracentesis. Last dialysis on Friday, completed full treatment. Presents to ED after episode of dizziness, fell, hit head. Found to have bradycardia in the 40s and BPs 83T-517O systolic.  Labs showed hypokalemia, chronic anemia.  Patient was given atropine, calcium gluconate, with improvement in HR to 60 and BP improved as well.  Nephrology consulted, patient is status post emergent HD.  Patient admitted for further management.     Today, patient still complained of significant headache, generalized weakness, rib pain. Patient with some shortness of breath likely due to significantly distended abdomen, denies any chest pain, nausea/vomiting, abdominal pain, fever/chills.     Assessment/Plan: Principal Problem:   Hyperkalemia, diminished renal excretion Active Problems:   Chronic systolic CHF (congestive heart failure), NYHA class 1 (HCC)   Essential hypertension, benign   Charcot foot due to diabetes mellitus ([place])   ESRD (end stage renal disease) on dialysis ([place])   Type 2 diabetes mellitus with diabetic chronic kidney disease (HCC)   Symptomatic bradycardia   Ascites   Hyperkalemia in the setting of ESRD (M/W/F) Status post emergent dialysis early am on 10/28/2019 Potassium normalized Daily BMP Telemetry  Symptomatic bradycardia Resolved Presented with dizziness/fall Possibly likely 2/2 hyperkalemia Echo pending Telemetry  Head trauma status post fall CT head showed no acute intracranial abnormality, right parietal scalp hematoma, no skull fracture.  Concern for infraorbital facial fracture CT maxillofacial negative for facial fracture, 13 mm groundglass lesion in the  right mandible.  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 Lasix Fluid restriction  History of chronic diastolic/systolic HF/advanced heart failure/Hx of pericardial effusion s/p pericardiocentesis on 01/2017 Last echo done on 04/2017 showed EF 20 to 25% I don't see any follow-up with cardiology/heart failure team on care everywhere Repeat echo pending Continue Lasix Strict I's and O's, daily weights  Diabetes mellitus type 2 Last A1c 7.5 SSI, Accu-Cheks, hypoglycemic protocol, dulaglutide Continue gabapentin  Hypertension BP stable Restart home hydralazine, Lasix  Chronic pain in knees/right Charcot foot Continue home pain regimen Continue boot on right foot       Malnutrition Type:      Malnutrition Characteristics:      Nutrition Interventions:       Estimated body mass index is 23.74 kg/m as calculated from the following:   Height as of this encounter: 6\' 4"  (1.93 m).   Weight as of this encounter: 88.5 kg.     Code Status: Full  Family Communication: None at bedside  Disposition Plan: Likely home on 10/29/2019 after work-up and procedure   Consultants:  None  Procedures:  None  Antimicrobials:  None  DVT prophylaxis: Heparin Omer   Objective: Vitals:   10/27/19 2201 10/27/19 2203 10/27/19 2211 10/28/19 0610  BP: 122/68   138/77  Pulse: 63 63 63 86  Resp: 20 17 16    Temp:    98.7 F (37.1  C)  TempSrc:    Oral  SpO2: 98% 97% 96% 96%  Weight:      Height:        Intake/Output Summary (Last 24 hours) at 10/28/2019 1214 Last data filed at  10/28/2019 0900 Gross per 24 hour  Intake 300 ml  Output --  Net 300 ml   Filed Weights   10/27/19 2131  Weight: 88.5 kg    Exam:  General: NAD, head bandage noted, scalp hematoma  Cardiovascular: S1, S2 present  Respiratory:  Bibasilar crackles noted  Abdomen: Soft, nontender, +++distended, bowel sounds present  Musculoskeletal: No bilateral pedal edema noted, right Charcot foot in boot  Skin: Normal  Psychiatry: Normal mood    Data Reviewed: CBC: Recent Labs  Lab 10/27/19 2031 10/27/19 2057 10/28/19 1011  WBC 9.0  --  8.8  NEUTROABS 6.8  --  6.7  HGB 10.2* 10.9*  10.5* 8.7*  HCT 32.9* 32.0*  31.0* 26.6*  MCV 92.4  --  88.7  PLT 231  --  542   Basic Metabolic Panel: Recent Labs  Lab 10/27/19 2031 10/27/19 2057 10/28/19 0208 10/28/19 1011  NA 134* 135  134* 136 138  K 6.9* 6.8*  6.8* 6.9* 4.4  CL 94* 101 96* 96*  CO2 21*  --  21* 25  GLUCOSE 155* 145* 122* 155*  BUN 84* 93* 87* 45*  CREATININE 11.21* 12.10* 10.91* 6.91*  CALCIUM 8.8*  --  8.7* 8.2*  MG 2.5*  --   --   --    GFR: Estimated Creatinine Clearance: 15.4 mL/min (A) (by C-G formula based on SCr of 6.91 mg/dL (H)). Liver Function Tests: Recent Labs  Lab 10/27/19 2031  AST 18  ALT 17  ALKPHOS 69  BILITOT 0.8  PROT 7.6  ALBUMIN 3.6   No results for input(s): LIPASE, AMYLASE in the last 168 hours. No results for input(s): AMMONIA in the last 168 hours. Coagulation Profile: Recent Labs  Lab 10/28/19 0010  INR 1.3*   Cardiac Enzymes: No results for input(s): CKTOTAL, CKMB, CKMBINDEX, TROPONINI in the last 168 hours. BNP (last 3 results) No results for input(s): PROBNP in the last 8760 hours. HbA1C: No results for input(s): HGBA1C in the last 72 hours. CBG: Recent Labs  Lab 10/27/19 2222 10/28/19 0803 10/28/19 1117  GLUCAP 99 171* 139*   Lipid Profile: No results for input(s): CHOL, HDL, LDLCALC, TRIG, CHOLHDL, LDLDIRECT in the last 72 hours. Thyroid Function  Tests: Recent Labs    10/27/19 2041  TSH 4.260   Anemia Panel: No results for input(s): VITAMINB12, FOLATE, FERRITIN, TIBC, IRON, RETICCTPCT in the last 72 hours. Urine analysis:    Component Value Date/Time   COLORURINE YELLOW 07/25/2017 [place] 07/25/2017 1821   LABSPEC 1.021 07/25/2017 1821   PHURINE 5.0 07/25/2017 1821   GLUCOSEU NEGATIVE 07/25/2017 1821   HGBUR SMALL (A) 07/25/2017 1821   BILIRUBINUR NEGATIVE 07/25/2017 1821   BILIRUBINUR negative 10/24/2014 1659   KETONESUR NEGATIVE 07/25/2017 1821   PROTEINUR 100 (A) 07/25/2017 1821   UROBILINOGEN 0.2 10/24/2014 1659   NITRITE NEGATIVE 07/25/2017 1821   LEUKOCYTESUR TRACE (A) 07/25/2017 1821   Sepsis Labs: @LABRCNTIP (procalcitonin:4,lacticidven:4)  ) Recent Results (from the past 240 hour(s))  SARS CORONAVIRUS 2 (TAT 6-24 HRS) Nasopharyngeal Nasopharyngeal Swab     Status: None   Collection Time: 10/27/19  8:35 PM   Specimen: Nasopharyngeal Swab  Result Value Ref Range Status   SARS Coronavirus 2 NEGATIVE NEGATIVE Final  Comment: (NOTE) SARS-CoV-2 target nucleic acids are NOT DETECTED. The SARS-CoV-2 RNA is generally detectable in upper and lower respiratory specimens during the acute phase of infection. Negative results do not preclude SARS-CoV-2 infection, do not rule out co-infections with other pathogens, and should not be used as the sole basis for treatment or other patient management decisions. Negative results must be combined with clinical observations, patient history, and epidemiological information. The expected result is Negative. Fact Sheet for Patients: SugarRoll.be Fact Sheet for Healthcare Providers: https://www.woods-mathews.com/ This test is not yet approved or cleared by the [place] FDA and  has been authorized for detection and/or diagnosis of SARS-CoV-2 by FDA under an Emergency Use Authorization (EUA). This EUA will  remain  in effect (meaning this test can be used) for the duration of the COVID-19 declaration under Section 56 4(b)(1) of the Act, 21 U.S.C. section 360bbb-3(b)(1), unless the authorization is terminated or revoked sooner. Performed at [facility], [place] [address], [place], [place] [address]   Blood culture (routine x 2)     Status: None (Preliminary result)   Collection Time: 10/27/19  8:45 PM   Specimen: BLOOD  Result Value Ref Range Status   Specimen Description BLOOD LEFT ANTECUBITAL  Final   Special Requests   Final    BOTTLES DRAWN AEROBIC AND ANAEROBIC Blood Culture adequate volume   Culture   Final    NO GROWTH < 12 HOURS Performed at [facility], [place] [address], [place], [place] [address]    Report Status PENDING  Incomplete  Blood culture (routine x 2)     Status: None (Preliminary result)   Collection Time: 10/27/19  8:50 PM   Specimen: BLOOD LEFT ARM  Result Value Ref Range Status   Specimen Description BLOOD LEFT ARM  Final   Special Requests   Final    BOTTLES DRAWN AEROBIC AND ANAEROBIC Blood Culture adequate volume   Culture   Final    NO GROWTH < 12 HOURS Performed at [facility], [place] [address] [place], [place] [address]    Report Status PENDING  Incomplete      Studies: CT Head Wo Contrast  Result Date: 10/27/2019 CLINICAL DATA:  Head trauma and headache EXAM: CT HEAD WITHOUT CONTRAST TECHNIQUE: Contiguous axial images were obtained from the base of the skull through the vertex without intravenous contrast. COMPARISON:  None. FINDINGS: Brain: No evidence of acute infarction, hemorrhage, hydrocephalus, extra-axial collection or mass lesion/mass effect. Vascular: No hyperdense vessel or unexpected calcification. Skull: No skull fracture. Multifocal soft tissue gas within the right masticator and parotid spaces. Right parietal scalp hematoma. Sinuses/Orbits: No acute finding. Other: None. IMPRESSION: 1. No acute intracranial abnormality. 2.  Right parietal scalp hematoma.  No skull fracture. 3. Multifocal soft tissue gas within the right masticator and parotid spaces. This could be a small amount of venous gas related IV infusion, though soft tissue gas related to a paranasal sinus fracture could also cause this appearance. The visualized sinuses are normal, but if there is concern for infraorbital facial fracture, maxillofacial CT should be considered. Electronically Signed   By: Ulyses Jarred M.D.   On: 10/27/2019 22:23   DG Chest Portable 1 View  Result Date: 10/27/2019 CLINICAL DATA:  Hypotension, dizziness, fell EXAM: PORTABLE CHEST 1 VIEW COMPARISON:  11/20/2018 FINDINGS: Single frontal view of the chest demonstrates stable enlargement of the cardiac silhouette. No airspace disease, effusion, or pneumothorax. No acute bony abnormalities. External defibrillator pad overlies  left chest. IMPRESSION: 1. Stable enlarged cardiac silhouette. 2. No acute airspace disease. Electronically Signed   By: Randa Ngo M.D.   On: 10/27/2019 21:09   DG Hip Unilat W or Wo Pelvis 2-3 Views Right  Result Date: 10/27/2019 CLINICAL DATA:  Hypertension EXAM: DG HIP (WITH OR WITHOUT PELVIS) 2-3V RIGHT COMPARISON:  None. FINDINGS: Bones of the pelvis are intact and congruent. Proximal femora are normally located and intact. Slight cam deformity of the right and femoral head neck. Mild degenerative changes in both hips. Surgical clips project in the right lower quadrant. Phleboliths and vascular calcium noted in the pelvis. Soft tissues are otherwise unremarkable. IMPRESSION: No acute fracture or traumatic malalignment. Mild cam type deformities of the femoral head neck junctions, could correlate for symptoms of femoroacetabular impingement. Electronically Signed   By: Lovena Le M.D.   On: 10/27/2019 22:16   CT MAXILLOFACIAL WO CONTRAST  Result Date: 10/28/2019 CLINICAL DATA:  Fall with head trauma. Abnormal head CT, rule out facial fracture EXAM: CT  MAXILLOFACIAL WITHOUT CONTRAST TECHNIQUE: Multidetector CT imaging of the maxillofacial structures was performed. Multiplanar CT image reconstructions were also generated. COMPARISON:  None. FINDINGS: Osseous: Negative for fracture. There is a ground-glass lesion with mild expansion at the angle of the right mandible measuring 13 mm. Normal mandibular location. Orbits: Negative.  No evidence of injury. Sinuses: Clear.  No hemosinus. Soft tissues: Resolved soft tissue gas which was intravenous in location. Limited intracranial: Negative. IMPRESSION: 1. Negative for facial fracture. Soft tissue gas on prior is resolved and was intravenous. 2. 13 mm ground-glass lesion in the right mandible. 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 Please correlate for any associated pain. Electronically Signed   By: Monte Fantasia M.D.   On: 10/28/2019 09:19    Scheduled Meds: . aspirin EC  81 mg Oral Q48H  . Chlorhexidine Gluconate Cloth  6 each Topical Q0600  . diphenhydrAMINE  25-50 mg Oral QHS  . diphenhydrAMINE  50 mg Oral Q M,W,F-HD  . Dulaglutide  0.75 mg Subcutaneous Q Sat  . ferric citrate  420 mg Oral TID WC  . furosemide  80 mg Oral BID  . gabapentin  100 mg Oral QHS  . [START ON 10/29/2019] gabapentin  100 mg Oral Q T,Th,S,Su  . gabapentin  200 mg Oral Q M,W,F  . heparin  5,000 Units Subcutaneous Q8H  . hydrALAZINE  25 mg Oral BID  . hydrOXYzine  25 mg Oral BID  . lidocaine-EPINEPHrine  20 mL Intradermal Once  . multivitamin  1 tablet Oral QHS  . oxyCODONE-acetaminophen  1 tablet Oral Q M,W,F-HD   And  . oxyCODONE  5 mg Oral Q M,W,F-HD  . oxyCODONE-acetaminophen  1 tablet Oral QHS   And  . oxyCODONE  5 mg Oral QHS  . pantoprazole  80 mg Oral Daily  . rOPINIRole  0.5 mg Oral Q M,W,F  . rOPINIRole  0.5 mg Oral QHS  . tamsulosin  0.8 mg Oral QHS  . traZODone  50 mg Oral QHS    Continuous Infusions:   LOS: 0 days     Alma Friendly, MD [facility]  If 7PM-7AM, please contact night-coverage [email or web address] 10/28/2019, 12:14 PM

## 2019-10-29 ENCOUNTER — Ambulatory Visit (HOSPITAL_COMMUNITY): Payer: Medicare Other

## 2019-10-29 ENCOUNTER — Encounter (HOSPITAL_COMMUNITY): Payer: Self-pay

## 2019-10-29 LAB — CBC WITH DIFFERENTIAL/PLATELET
Abs Immature Granulocytes: 0.06 10*3/uL (ref 0.00–0.07)
Basophils Absolute: 0.1 10*3/uL (ref 0.0–0.1)
Basophils Relative: 1 %
Eosinophils Absolute: 0.3 10*3/uL (ref 0.0–0.5)
Eosinophils Relative: 4 %
HCT: 28.7 % — ABNORMAL LOW (ref 39.0–52.0)
Hemoglobin: 9.1 g/dL — ABNORMAL LOW (ref 13.0–17.0)
Immature Granulocytes: 1 %
Lymphocytes Relative: 15 %
Lymphs Abs: 1.1 10*3/uL (ref 0.7–4.0)
MCH: 28.7 pg (ref 26.0–34.0)
MCHC: 31.7 g/dL (ref 30.0–36.0)
MCV: 90.5 fL (ref 80.0–100.0)
Monocytes Absolute: 0.8 10*3/uL (ref 0.1–1.0)
Monocytes Relative: 10 %
Neutro Abs: 5.1 10*3/uL (ref 1.7–7.7)
Neutrophils Relative %: 69 %
Platelets: 188 10*3/uL (ref 150–400)
RBC: 3.17 MIL/uL — ABNORMAL LOW (ref 4.22–5.81)
RDW: 14.9 % (ref 11.5–15.5)
WBC: 7.3 10*3/uL (ref 4.0–10.5)
nRBC: 0 % (ref 0.0–0.2)

## 2019-10-29 LAB — BASIC METABOLIC PANEL
Anion gap: 15 (ref 5–15)
BUN: 60 mg/dL — ABNORMAL HIGH (ref 6–20)
CO2: 26 mmol/L (ref 22–32)
Calcium: 8.4 mg/dL — ABNORMAL LOW (ref 8.9–10.3)
Chloride: 96 mmol/L — ABNORMAL LOW (ref 98–111)
Creatinine, Ser: 8.79 mg/dL — ABNORMAL HIGH (ref 0.61–1.24)
GFR calc Af Amer: 7 mL/min — ABNORMAL LOW (ref >60)
GFR calc non Af Amer: 6 mL/min — ABNORMAL LOW (ref >60)
Glucose, Bld: 143 mg/dL — ABNORMAL HIGH (ref 70–99)
Potassium: 4.9 mmol/L (ref 3.5–5.1)
Sodium: 137 mmol/L (ref 135–145)

## 2019-10-29 LAB — IRON AND TIBC
Iron: 70 ug/dL (ref 45–182)
Saturation Ratios: 35 % (ref 17.9–39.5)
TIBC: 197 ug/dL — ABNORMAL LOW (ref 250–450)
UIBC: 127 ug/dL

## 2019-10-29 LAB — VITAMIN B12: Vitamin B-12: 399 pg/mL (ref 180–914)

## 2019-10-29 LAB — TYPE AND SCREEN
ABO/RH(D): O POS
Antibody Screen: NEGATIVE

## 2019-10-29 LAB — FERRITIN: Ferritin: 1819 ng/mL — ABNORMAL HIGH (ref 24–336)

## 2019-10-29 LAB — FOLATE: Folate: 35.1 ng/mL (ref >5.9)

## 2019-10-29 LAB — HEPATITIS B SURFACE ANTIGEN: Hepatitis B Surface Ag: NEGATIVE — AB

## 2019-10-29 LAB — GLUCOSE, CAPILLARY
Glucose-Capillary: 152 mg/dL — ABNORMAL HIGH (ref 70–99)
Glucose-Capillary: 225 mg/dL — ABNORMAL HIGH (ref 70–99)

## 2019-10-29 LAB — ABO/RH: ABO/RH(D): O POS

## 2019-10-29 LAB — MAGNESIUM: Magnesium: 2.1 mg/dL (ref 1.7–2.4)

## 2019-10-29 NOTE — Progress Notes (Addendum)
DISCHARGE NOTE  James Burke to be discharged Home per MD order. Patient verbalized understanding.  Skin clean, dry and intact without evidence of skin break down, no evidence of skin tears noted. IV catheter discontinued intact. Site without signs and symptoms of complications. Dressing and pressure applied. Pt denies pain at the site currently. No complaints noted.  Patient free of lines and drains. Changed dressing on the right side of head, pressure dressing in place.  Discharge packet assembled. An After Visit Summary (AVS) was printed and given to the patient. Patient escorted via wheelchair and discharged to designated family member via private transportation. All questions and concerns addressed.   Dolores Hoose, RN

## 2019-10-29 NOTE — Plan of Care (Signed)
  Problem: Education: Goal: Knowledge of General Education information will improve Description: Including pain rating scale, medication(s)/side effects and non-pharmacologic comfort measures Outcome: Progressing   Problem: Activity: Goal: Risk for activity intolerance will decrease Outcome: Progressing   

## 2019-10-29 NOTE — Evaluation (Signed)
Physical Therapy Evaluation Patient Details Name: James Burke MRN: [id number] DOB: 06-27-1966 Today's Date: 10/29/2019   History of Present Illness  54 yo male with past medical history of congestive heart failure, ESRD on dialysis MWF, recurrent ascites, diabetes, DVT, Charcot foot who presents to the ED on 2/21 for bradycardia, hypotension, and lightheadedness with fall and + head trauma (CT head negative for acute changes). S/p paracentesis with removal of 5.5 L fluid on 2/22. Pt with hyperkalemia, renal dysfunction.  Clinical Impression   Pt presents with [facility] strength, WFL bed mobility/transfers, slow and steady gait which is pt baseline per pt and wife, and decreased activity tolerance requiring rest breaks also pt baseline. Pt to benefit from acute PT to address deficits. Pt ambulated hallway distance with use of cane, and proficiently navigated flight of steps with increased time and required rest break after ascending steps which pt states is typical. Pt with no reports of dizziness or lightheadedness during mobility. No PT follow up indicated at this time, pt at baseline mobility and does not require further acute PT.     Follow Up Recommendations No PT follow up;Supervision for mobility/OOB    Equipment Recommendations  None recommended by PT    Recommendations for Other Services       Precautions / Restrictions Precautions Precautions: Fall Precaution Comments: R charcot foot, wears boot in WB Restrictions Weight Bearing Restrictions: No      Mobility  Bed Mobility Overal bed mobility: Modified Independent             General bed mobility comments: use of bedrails, no physical assist from PT with good trunk activation to come to sitting EOB.  Transfers Overall transfer level: Needs assistance Equipment used: None Transfers: Sit to/from Stand Sit to Stand: Supervision;From elevated surface         General transfer comment: supervision for safety, increased  time to rise and reached for cane one standing.  Ambulation/Gait Ambulation/Gait assistance: Supervision Gait Distance (Feet): 150 Feet Assistive device: Straight cane Gait Pattern/deviations: Step-through pattern;Decreased stride length;Trunk flexed;Antalgic Gait velocity: decr   General Gait Details: supervision for safety, verbal cuing for proper placement and holding of cane. Per pt, he has chronic L knee dysfunction so uses cane on R hand. Slow and steady gait.  Stairs Stairs: Yes Stairs assistance: Min guard Stair Management: One rail Right;With cane;Forwards;Step to pattern Number of Stairs: 12 General stair comments: min guard for safety, verbal cuing for sequencing with cane (cane not used for descending, performed [facility] with use of rail only).  Wheelchair Mobility    Modified Rankin (Stroke Patients Only)       Balance Overall balance assessment: History of Falls;Mild deficits observed, not formally tested                                           Pertinent Vitals/Pain Pain Assessment: 0-10 Pain Score: 5  Pain Location: chest, due to paracentesis Pain Descriptors / Indicators: Sore Pain Intervention(s): Limited activity within patient's tolerance;Monitored during session;Repositioned    Home Living Family/patient expects to be discharged to:: Private residence Living Arrangements: Spouse/significant other Available Help at Discharge: Family;Available 24 hours/day Type of Home: House Home Access: Stairs to enter Entrance Stairs-Rails: Right Entrance Stairs-Number of Steps: 12 Home Layout: One level Home Equipment: Walker - 2 wheels;Bedside commode;Cane - single point      Prior Function Level  of Independence: Independent with assistive device(s)         Comments: pt reports using cane for community ambulation, otherwise is independent with mobility     Hand Dominance   Dominant Hand: Right    Extremity/Trunk Assessment   Upper  Extremity Assessment Upper Extremity Assessment: Overall WFL for tasks assessed    Lower Extremity Assessment Lower Extremity Assessment: Overall WFL for tasks assessed;RLE deficits/detail RLE Deficits / Details: R charcot foot, wears boot    Cervical / Trunk Assessment Cervical / Trunk Assessment: Normal  Communication   Communication: No difficulties  Cognition Arousal/Alertness: Awake/alert Behavior During Therapy: WFL for tasks assessed/performed;Flat affect Overall Cognitive Status: Within Functional Limits for tasks assessed                                        General Comments General comments (skin integrity, edema, etc.): PT discussed concussive signs and symptoms, including sensitivity to stimulation (TV, light), headaches, N/V, difficulty concentrating. PT encouarged pt and pt's wife to monitor for s/s of concussion and rest from irritating stimuli as needed upon d/c.    Exercises     Assessment/Plan    PT Assessment Patent does not need any further PT services  PT Problem List         PT Treatment Interventions      PT Goals (Current goals can be found in the Care Plan section)  Acute Rehab PT Goals Patient Stated Goal: go home PT Goal Formulation: With patient/family Time For Goal Achievement: 10/29/19 Potential to Achieve Goals: Good    Frequency     Barriers to discharge        Co-evaluation               AM-PAC PT "6 Clicks" Mobility  Outcome Measure Help needed turning from your back to your side while in a flat bed without using bedrails?: None Help needed moving from lying on your back to sitting on the side of a flat bed without using bedrails?: None Help needed moving to and from a bed to a chair (including a wheelchair)?: None Help needed standing up from a chair using your arms (Burke.g., wheelchair or bedside chair)?: None Help needed to walk in hospital room?: None Help needed climbing 3-5 steps with a railing? : A  Little 6 Click Score: 23    End of Session   Activity Tolerance: Patient tolerated treatment well Patient left: in bed;with call bell/phone within reach;with family/visitor present Nurse Communication: Mobility status      Time: 0272-5366 PT Time Calculation (min) (ACUTE ONLY): 18 min   Charges:   PT Evaluation $PT Eval Low Complexity: 1 Low         James Burke, PT Acute Rehabilitation Services Pager [phone]  Office [phone]  Wray Goehring D Elonda Husky 10/29/2019, 12:50 PM

## 2019-10-29 NOTE — Progress Notes (Signed)
[facility] Progress Note   Subjective:   Seen in room - no CP/dyspnea. See RN note than had short run of VT overnight, patient was completely asymptomatic. Possibly for d/c today.  Objective Vitals:   10/28/19 2109 10/28/19 2217 10/29/19 0528 10/29/19 0914  BP: 120/61 119/60 122/64 128/65  Pulse: 77 74 69 73  Resp: 18 16 18 18   Temp: 98.3 F (36.8 C) 98.3 F (36.8 C) 98.3 F (36.8 C) 98.6 F (37 C)  TempSrc: Oral Oral Oral Oral  SpO2: 96% 97% 96% 96%  Weight:      Height:       Physical Exam General: Well appearing man, NAD. Head bandaged. Heart: RRR; no murmur Lungs: CTAB Abdomen: soft, but distended Extremities: No LE edema Dialysis Access:  AVF + thrill  Additional Objective Labs: Basic Metabolic Panel: Recent Labs  Lab 10/28/19 0208 10/28/19 1011 10/29/19 0443  NA 136 138 137  K 6.9* 4.4 4.9  CL 96* 96* 96*  CO2 21* 25 26  GLUCOSE 122* 155* 143*  BUN 87* 45* 60*  CREATININE 10.91* 6.91* 8.79*  CALCIUM 8.7* 8.2* 8.4*   Liver Function Tests: Recent Labs  Lab 10/27/19 2031  AST 18  ALT 17  ALKPHOS 69  BILITOT 0.8  PROT 7.6  ALBUMIN 3.6   CBC: Recent Labs  Lab 10/27/19 2031 10/27/19 2031 10/27/19 2057 10/28/19 1011 10/29/19 0443  WBC 9.0  --   --  8.8 7.3  NEUTROABS 6.8  --   --  6.7 5.1  HGB 10.2*   < > 10.9*  10.5* 8.7* 9.1*  HCT 32.9*   < > 32.0*  31.0* 26.6* 28.7*  MCV 92.4  --   --  88.7 90.5  PLT 231  --   --  193 188   < > = values in this interval not displayed.   CBG: Recent Labs  Lab 10/28/19 0803 10/28/19 1117 10/28/19 1606 10/28/19 2133 10/29/19 0701  GLUCAP 171* 139* 150* 209* 152*   Iron Studies:  Recent Labs    10/29/19 0443  IRON 70  TIBC 197*  FERRITIN 1,819*   @lablastinr3 @ Studies/Results: CT Head Wo Contrast  Result Date: 10/27/2019 CLINICAL DATA:  Head trauma and headache EXAM: CT HEAD WITHOUT CONTRAST TECHNIQUE: Contiguous axial images were obtained from the base of the skull  through the vertex without intravenous contrast. COMPARISON:  None. FINDINGS: Brain: No evidence of acute infarction, hemorrhage, hydrocephalus, extra-axial collection or mass lesion/mass effect. Vascular: No hyperdense vessel or unexpected calcification. Skull: No skull fracture. Multifocal soft tissue gas within the right masticator and parotid spaces. Right parietal scalp hematoma. Sinuses/Orbits: No acute finding. Other: None. IMPRESSION: 1. No acute intracranial abnormality. 2. Right parietal scalp hematoma.  No skull fracture. 3. Multifocal soft tissue gas within the right masticator and parotid spaces. This could be a small amount of venous gas related IV infusion, though soft tissue gas related to a paranasal sinus fracture could also cause this appearance. The visualized sinuses are normal, but if there is concern for infraorbital facial fracture, maxillofacial CT should be considered. Electronically Signed   By: Ulyses Jarred M.D.   On: 10/27/2019 22:23   DG Chest Portable 1 View  Result Date: 10/27/2019 CLINICAL DATA:  Hypotension, dizziness, fell EXAM: PORTABLE CHEST 1 VIEW COMPARISON:  11/20/2018 FINDINGS: Single frontal view of the chest demonstrates stable enlargement of the cardiac silhouette. No airspace disease, effusion, or pneumothorax. No acute bony abnormalities. External defibrillator pad overlies left chest.  IMPRESSION: 1. Stable enlarged cardiac silhouette. 2. No acute airspace disease. Electronically Signed   By: Randa Ngo M.D.   On: 10/27/2019 21:09   ECHOCARDIOGRAM COMPLETE  Result Date: 10/28/2019    ECHOCARDIOGRAM REPORT   Patient Name:   VOLNEY REIERSON Date of Exam: 10/28/2019 Medical Rec #:  [id number]       Height:       76.0 in Accession #:    [id number]      Weight:       195.0 lb Date of Birth:  [birth year]        BSA:          2.192 m Patient Age:    40 years        BP:           138/77 mmHg Patient Gender: M               HR:           86 bpm. Exam Location:  Inpatient  Procedure: 2D Echo and Intracardiac Opacification Agent Indications:    Syncope 780.2 / R55  History:        Patient has prior history of Echocardiogram examinations, most                 recent 04/12/2017. CHF; Risk Factors:Diabetes, Hypertension and                 Dyslipidemia. History of pericardial effusion.  Sonographer:    Vikki Ports Turrentine Referring Phys: [id number] [place]  1. Left ventricular ejection fraction, by estimation, is 40 to 45%. The left ventricle has mildly decreased function. The left ventricle has no regional wall motion abnormalities. The left ventricular internal cavity size was severely dilated. Left ventricular diastolic parameters are consistent with Grade II diastolic dysfunction (pseudonormalization). Elevated left ventricular end-diastolic pressure.  2. Right ventricular systolic function is normal. The right ventricular size is normal. There is mildly elevated pulmonary artery systolic pressure.  3. Left atrial size was severely dilated.  4. Right atrial size was moderately dilated.  5. The mitral valve is normal in structure and function. Trivial mitral valve regurgitation. No evidence of mitral stenosis.  6. The aortic valve is normal in structure and function. Aortic valve regurgitation is not visualized. No aortic stenosis is present.  7. The inferior vena cava is dilated in size with <50% respiratory variability, suggesting right atrial pressure of 15 mmHg. FINDINGS  Left Ventricle: Left ventricular ejection fraction, by estimation, is 40 to 45%. The left ventricle has mildly decreased function. The left ventricle has no regional wall motion abnormalities. Definity contrast agent was given IV to delineate the left ventricular endocardial borders. The left ventricular internal cavity size was severely dilated. There is no left ventricular hypertrophy. Left ventricular diastolic parameters are consistent with Grade II diastolic dysfunction  (pseudonormalization). Elevated left ventricular end-diastolic pressure. Right Ventricle: The right ventricular size is normal. No increase in right ventricular wall thickness. Right ventricular systolic function is normal. There is mildly elevated pulmonary artery systolic pressure. The tricuspid regurgitant velocity is 2.32  m/s, and with an assumed right atrial pressure of 15 mmHg, the estimated right ventricular systolic pressure is 94.4 mmHg. Left Atrium: Left atrial size was severely dilated. Right Atrium: Right atrial size was moderately dilated. Pericardium: There is no evidence of pericardial effusion. Mitral Valve: The mitral valve is normal in structure and function. Normal mobility of the mitral valve leaflets. Trivial mitral valve regurgitation.  No evidence of mitral valve stenosis. Tricuspid Valve: The tricuspid valve is normal in structure. Tricuspid valve regurgitation is mild . No evidence of tricuspid stenosis. Aortic Valve: The aortic valve is normal in structure and function. Aortic valve regurgitation is not visualized. No aortic stenosis is present. Pulmonic Valve: The pulmonic valve was normal in structure. Pulmonic valve regurgitation is not visualized. No evidence of pulmonic stenosis. Aorta: The aortic root is normal in size and structure. Venous: The inferior vena cava is dilated in size with less than 50% respiratory variability, suggesting right atrial pressure of 15 mmHg. IAS/Shunts: No atrial level shunt detected by color flow Doppler.  LEFT VENTRICLE PLAX 2D LVIDd:         6.67 cm  Diastology LVIDs:         5.44 cm  LV e' lateral:   10.10 cm/s LV PW:         1.11 cm  LV E/e' lateral: 10.6 LV IVS:        1.14 cm  LV e' medial:    4.70 cm/s LVOT diam:     2.40 cm  LV E/e' medial:  22.8 LV SV:         82.79 ml LV SV Index:   37.78 LVOT Area:     4.52 cm  RIGHT VENTRICLE RV S prime:     4.91 cm/s TAPSE (M-mode): 1.8 cm LEFT ATRIUM              Index       RIGHT ATRIUM           Index LA  diam:        5.15 cm  2.35 cm/m  RA Area:     27.00 cm LA Vol (A2C):   129.0 ml 58.86 ml/m RA Volume:   92.60 ml  42.25 ml/m LA Vol (A4C):   100.0 ml 45.63 ml/m LA Biplane Vol: 116.0 ml 52.93 ml/m  AORTIC VALVE LVOT Vmax:   102.00 cm/s LVOT Vmean:  65.500 cm/s LVOT VTI:    0.183 m  AORTA Ao Root diam: 3.20 cm MITRAL VALVE                TRICUSPID VALVE MV Area (PHT): 4.60 cm     TR Peak grad:   21.5 mmHg MV Decel Time: 165 msec     TR Vmax:        232.00 cm/s MV E velocity: 107.00 cm/s MV A velocity: 57.40 cm/s   SHUNTS MV E/A ratio:  1.86         Systemic VTI:  0.18 m                             Systemic Diam: 2.40 cm Fransico Him MD Electronically signed by Fransico Him MD Signature Date/Time: 10/28/2019/6:32:40 PM    Final    DG Hip Unilat W or Wo Pelvis 2-3 Views Right  Result Date: 10/27/2019 CLINICAL DATA:  Hypertension EXAM: DG HIP (WITH OR WITHOUT PELVIS) 2-3V RIGHT COMPARISON:  None. FINDINGS: Bones of the pelvis are intact and congruent. Proximal femora are normally located and intact. Slight cam deformity of the right and femoral head neck. Mild degenerative changes in both hips. Surgical clips project in the right lower quadrant. Phleboliths and vascular calcium noted in the pelvis. Soft tissues are otherwise unremarkable. IMPRESSION: No acute fracture or traumatic malalignment. Mild cam type deformities of the femoral head neck junctions, could  correlate for symptoms of femoroacetabular impingement. Electronically Signed   By: Lovena Le M.D.   On: 10/27/2019 22:16   CT MAXILLOFACIAL WO CONTRAST  Result Date: 10/28/2019 CLINICAL DATA:  Fall with head trauma. Abnormal head CT, rule out facial fracture EXAM: CT MAXILLOFACIAL WITHOUT CONTRAST TECHNIQUE: Multidetector CT imaging of the maxillofacial structures was performed. Multiplanar CT image reconstructions were also generated. COMPARISON:  None. FINDINGS: Osseous: Negative for fracture. There is a ground-glass lesion with mild expansion  at the angle of the right mandible measuring 13 mm. Normal mandibular location. Orbits: Negative.  No evidence of injury. Sinuses: Clear.  No hemosinus. Soft tissues: Resolved soft tissue gas which was intravenous in location. Limited intracranial: Negative. IMPRESSION: 1. Negative for facial fracture. Soft tissue gas on prior is resolved and was intravenous. 2. 13 mm ground-glass lesion in the right mandible. 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 Please correlate for any associated pain. Electronically Signed   By: Monte Fantasia M.D.   On: 10/28/2019 09:19   IR Paracentesis  Result Date: 10/28/2019 INDICATION: History of congestive heart failure, end-stage renal disease, cirrhosis with recurrent ascites. Request for therapeutic paracentesis. EXAM: ULTRASOUND GUIDED PARACENTESIS MEDICATIONS: 1% lidocaine 10 mL COMPLICATIONS: None immediate. PROCEDURE: Informed written consent was obtained from the patient after a discussion of the risks, benefits and alternatives to treatment. A timeout was performed prior to the initiation of the procedure. Initial ultrasound scanning demonstrates a large amount of ascites within the right lower abdominal quadrant. The right lower abdomen was prepped and draped in the usual sterile fashion. 1% lidocaine was used for local anesthesia. Following this, a 19 gauge, 7-cm, Yueh catheter was introduced. An ultrasound image was saved for documentation purposes. The paracentesis was performed. The catheter was removed and a dressing was applied. The patient tolerated the procedure well without immediate post procedural complication. FINDINGS: A total of approximately 5.5 L of clear yellow fluid was removed. IMPRESSION: Successful ultrasound-guided paracentesis yielding 5.5 liters of peritoneal fluid. Read by: Gareth Eagle, PA-C Electronically Signed   By: Corrie Mckusick D.O.   On: 10/28/2019 14:07   Medications:  . aspirin EC  81 mg Oral Q48H   . Chlorhexidine Gluconate Cloth  6 each Topical Q0600  . diphenhydrAMINE  25-50 mg Oral QHS  . diphenhydrAMINE  50 mg Oral Q M,W,F-HD  . Dulaglutide  0.75 mg Subcutaneous Q Sat  . ferric citrate  420 mg Oral TID WC  . furosemide  80 mg Oral BID  . gabapentin  100 mg Oral QHS  . gabapentin  100 mg Oral Q T,Th,S,Su  . gabapentin  200 mg Oral Q M,W,F  . heparin  5,000 Units Subcutaneous Q8H  . hydrALAZINE  25 mg Oral BID  . hydrOXYzine  25 mg Oral BID  . insulin aspart  0-6 Units Subcutaneous TID WC  . lidocaine-EPINEPHrine  20 mL Intradermal Once  . multivitamin  1 tablet Oral QHS  . oxyCODONE-acetaminophen  1 tablet Oral Q M,W,F-HD   And  . oxyCODONE  5 mg Oral Q M,W,F-HD  . oxyCODONE-acetaminophen  1 tablet Oral QHS   And  . oxyCODONE  5 mg Oral QHS  . pantoprazole  80 mg Oral Daily  . rOPINIRole  0.5 mg Oral Q M,W,F  . rOPINIRole  0.5 mg Oral QHS  . tamsulosin  0.8 mg Oral QHS  . traZODone  50 mg Oral QHS    Dialysis Orders: MWF at [facility], 450/800, EDW  88kg, 2K/2Ca, AVF, no heparin  Assessment/Plan: 1. Bradycardia/dizziness: In setting of #2. Required atropine. Improved now. 2. Hyperkalemia with EKG changes: S/p medical treatment followed by urgent HD. 3. ESRD: Usual MWF sched - s/p HD 2/21. Next HD tomorrow - as outpatient. 4. HTN/volume: BP stable 5. Anemia: Hgb 9.1 - follow. 6. Secondary hyperparathyroidism: Ca ok, Phos pending- continue home binders Lorin Picket) 7. ?Cirrhosis with recurrent large volume paracentesis: S/p 5.5L para 2/22. 8. Head wound: S/p fall - bandaged.  Veneta Penton, PA-C 10/29/2019, 11:01 AM  [facility] Pager: [phone]

## 2019-10-29 NOTE — Plan of Care (Signed)

## 2019-10-29 NOTE — Discharge Summary (Signed)
Discharge Summary  James Burke EXN:[id number] DOB: Feb 11, 1966  PCP: [name], MD  Admit date: 10/27/2019 Discharge date: 10/29/2019  Time spent: 40 mins  Recommendations for Outpatient Follow-up:  1. PCP in 1 week 2. Cardiology  Discharge Diagnoses:  Active Hospital Problems   Diagnosis Date Noted  . Hyperkalemia, diminished renal excretion 11/20/2018  . Symptomatic bradycardia 10/27/2019  . Ascites 10/27/2019  . ESRD (end stage renal disease) on dialysis ([facility])   . Charcot foot due to diabetes mellitus ([facility]) 10/23/2015  . Type 2 diabetes mellitus with diabetic chronic kidney disease ([facility]) 04/17/2014  . Essential hypertension, benign 01/12/2012  . Chronic systolic CHF (congestive heart failure), NYHA class 1 ([facility]) 01/03/2012    Resolved Hospital Problems  No resolved problems to display.    Discharge Condition: Stable  Diet recommendation: Renal diet  Vitals:   10/29/19 0528 10/29/19 0914  BP: 122/64 128/65  Pulse: 69 73  Resp: 18 18  Temp: 98.3 F (36.8 C) 98.6 F (37 C)  SpO2: 96% 96%    History of present illness:  James A Bohannonis a 54 y.o.malewith medical history significant ofESRD MWF, DM2, HTN, CHF, recurrent ascites requiring recurrent large volume paracentesis. Last dialysis on Friday, completed full treatment. Presents to ED after episode of dizziness, fell, hit head. Found to have bradycardia in the 40s and BPs 49Q-759F systolic.  Labs showed hypokalemia, chronic anemia.  Patient was given atropine, calcium gluconate, with improvement in HR to 60 and BP improved as well.  Nephrology consulted, patient is status post emergent HD.  Patient admitted for further management.     Today, patient still complaining of some headache, tender to palpate around scalp wound, chronic rib pain.  Patient denies any chest pain, shortness of breath, abdominal pain, nausea/vomiting, fever/chills.  Patient noted to have about 16 beat runs of V. tach,  asymptomatic.  Electrolytes WNL.  Patient advised to follow-up with cardiology, cardiology Master called to schedule an appointment for follow-up.    Hospital Course:  Principal Problem:   Hyperkalemia, diminished renal excretion Active Problems:   Chronic systolic CHF (congestive heart failure), NYHA class 1 (H[facility])   Essential hypertension, benign   Charcot foot due to diabetes mellitus ([facility])   ESRD (end stage renal disease) on dialysis ([facility])   Type 2 diabetes mellitus with diabetic chronic kidney disease ([facility])   Symptomatic bradycardia   Ascites   Hyperkalemia in the setting of ESRD (M/W/F) Status post emergent dialysis early am on 10/28/2019 Potassium normalized Follow-up with outpatient nephrology  Symptomatic bradycardia Resolved, required atropine Possibly likely 2/2 hyperkalemia Echo showed EF of 40 to 63%, grade 2 diastolic dysfunction, no pericardial effusion, improved from last echo done in 2018 which showed an EF of 20 to 25% Follow-up with cardiology  Head trauma status post fall CT head showed no acute intracranial abnormality, right parietal scalp hematoma, no skull fracture.  Concern for infraorbital facial fracture CT maxillofacial negative for facial fracture, 13 mm groundglass lesion in the right mandible.  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  No immediate complications. Continue  p.o. Lasix, Fluid restriction Follow-up with IR/PCP/nephrology  History of chronic diastolic/systolic HF/advanced heart  failure/Hx of pericardial effusion s/p pericardiocentesis on 01/2017 Recent echo as shown above Patient has been lost to follow-up with cardiology, call cardiology master on 10/29/19 to reestablish care with Dr. Stanford Breed at [facility] as well Continue Lasix, fluid restriction  Diabetes mellitus type 2 Last A1c 7.5 Continue home regimen, gabapentin  Hypertension BP stable Continue home hydralazine, Lasix  Chronic pain in knees/right Charcot foot Continue home pain regimen Continue boot on right foot        Malnutrition Type:      Malnutrition Characteristics:      Nutrition Interventions:      Estimated body mass index is 23.74 kg/m as calculated from the following:   Height as of this encounter: 6\' 4"  (1.93 m).   Weight as of this encounter: 88.5 kg.    Procedures:  Paracentesis 10/28/2019  Consultations:  Nephrology  Discharge Exam: BP 128/65 (BP Location: Right Arm)   Pulse 73   Temp 98.6 F (37 C) (Oral)   Resp 18   Ht 6\' 4"  (1.93 m)   Wt 88.5 kg   SpO2 96%   BMI 23.74 kg/m   General: NAD Cardiovascular: S1, S2 present Respiratory: CTA B  Discharge Instructions You were cared for by a hospitalist during your hospital stay. If you have any questions about your discharge medications or the care you received while you were in the hospital after you are discharged, you can call the unit and asked to speak with the hospitalist on call if the hospitalist that took care of you is not available. Once you are discharged, your primary care physician will handle any further medical issues. Please note that NO REFILLS for any discharge medications will be authorized once you are discharged, as it is imperative that you return to your primary care physician (or establish a relationship with a primary care physician if you do not have one) for your aftercare needs so that they can reassess your need for medications and monitor your lab values.  Discharge  Instructions    Diet - low sodium heart healthy   Complete by: As directed    Increase activity slowly   Complete by: As directed      Allergies as of 10/29/2019      Reactions   Prednisone Other (See Comments)   BLINDNESS > [ ? CSCR ? ]   Tape Other (See Comments)   Paper tape causes blisters    Simvastatin Other (See Comments)   Myalgia, joint pain   Lorazepam Anxiety, Other (See Comments)   Nervousness and "fidgets"; legs "need to kick, "electrical currents"   Methocarbamol Diarrhea   Seroquel [quetiapine Fumarate] Other (See Comments)   QT prolongation      Medication List    TAKE these medications   acetaminophen 500 MG tablet Commonly known as: TYLENOL Take 1,000 mg by mouth See admin instructions. Take 2 tablets (1000 mg) by mouth during dialysis on Monday, Wednesday, Friday, take 2 tablets (1000 mg) daily at bedtime   amLODipine 10 MG tablet Commonly known as: NORVASC TAKE 1 TABLET BY MOUTH EVERY DAY What changed: when to take this   Artificial Tears 1.4 % ophthalmic solution Generic drug: polyvinyl alcohol Place 1 drop into both eyes at bedtime as needed for dry eyes (itching).   aspirin EC 81 MG tablet Take 81 mg by mouth See admin instructions. Take one tablet (81 mg) by mouth every  other night   Auryxia 1 GM 210 MG(Fe) tablet Generic drug: ferric citrate Take 210-420 mg by mouth See admin instructions. Take 2 tablets (420 mg) by mouth three times daily with meals, take 1 tablet (210 mg) with snacks   b complex-vitamin c-folic acid 0.8 MG Tabs tablet Take 1 tablet by mouth at bedtime.   carvedilol 25 MG tablet Commonly known as: COREG TAKE 1 TABLET TWICE A DAY WITH FOOD What changed: when to take this   diphenhydrAMINE 25 MG tablet Commonly known as: BENADRYL Take 25 mg by mouth See admin instructions. Take 2 tablets (50 mg) by mouth during dialysis on Monday, Wednesday, Friday, take 1-2 tablets (25-50 mg) daily at bedtime   furosemide 80 MG  tablet Commonly known as: LASIX TAKE 1 TABLET (80 MG TOTAL) BY MOUTH 2 (TWO) TIMES DAILY. What changed:   how much to take  when to take this  additional instructions   gabapentin 100 MG capsule Commonly known as: NEURONTIN Take 2 capsules (200 mg total) by mouth 2 (two) times daily as needed (Give one hour prior to hemodialysis session as needed for anxiety/restlessness). What changed:   how much to take  when to take this  additional instructions   hydrALAZINE 25 MG tablet Commonly known as: APRESOLINE TAKE 1 TABLET (25 MG TOTAL) 3 (THREE) TIMES DAILY BY MOUTH. What changed: when to take this   hydrOXYzine 25 MG tablet Commonly known as: ATARAX/VISTARIL Take 25 mg by mouth 2 (two) times daily.   isosorbide mononitrate 30 MG 24 hr tablet Commonly known as: IMDUR Take 1 tablet (30 mg total) by mouth daily. What changed: when to take this   lidocaine-prilocaine cream Commonly known as: EMLA APPLY THREE TIMES A WEEK AS DIRECTED 1 HOUR PRIOR TO DIALYSIS AND WRAP WITH PLASTIC WRAP What changed: See the new instructions.   omeprazole 40 MG capsule Commonly known as: PRILOSEC TAKE 1 CAPSULE BY MOUTH EVERY DAY What changed: how much to take   oxyCODONE-acetaminophen 10-325 MG tablet Commonly known as: PERCOCET Take 1 tablet by mouth every 4 (four) hours as needed for pain. What changed:   when to take this  additional instructions   rOPINIRole 0.25 MG tablet Commonly known as: REQUIP TAKE 1 TO 2 TABLETS AT BEDTIME What changed: See the new instructions.   tamsulosin 0.4 MG Caps capsule Commonly known as: FLOMAX TAKE 2 CAPSULES BY MOUTH DAILY AFTER SUPPER What changed: See the new instructions.   traZODone 50 MG tablet Commonly known as: DESYREL TAKE 1 TABLET AT BEDTIME   Trulicity 6.28 BT/5.1VO Sopn Generic drug: Dulaglutide INJECT 0.75MG  EVERY 7 DAYS What changed: See the new instructions.   Vitamin D (Ergocalciferol) 1.25 MG (50000 UNIT) Caps  capsule Commonly known as: DRISDOL Take 50,000 Units by mouth every 7 (seven) days.      Allergies  Allergen Reactions  . Prednisone Other (See Comments)    BLINDNESS > [ ? CSCR ? ]  . Tape Other (See Comments)    Paper tape causes blisters   . Simvastatin Other (See Comments)    Myalgia, joint pain  . Lorazepam Anxiety and Other (See Comments)    Nervousness and "fidgets"; legs "need to kick, "electrical currents"  . Methocarbamol Diarrhea  . Seroquel [Quetiapine Fumarate] Other (See Comments)    QT prolongation   Follow-up Information    [name], MD. Schedule an appointment as soon as possible for a visit in 1 week(s).   Specialty: Family Medicine Contact  information: [place] [address] [phone]        Lelon Perla, MD Follow up.   Specialty: Cardiology Why: They will call you to set up an appointment, If you dont hear from them in the next couple of weeks, call them. Contact information: [address] [place] [address] [phone]            The results of significant diagnostics from this hospitalization (including imaging, microbiology, ancillary and laboratory) are listed below for reference.    Significant Diagnostic Studies: CT Head Wo Contrast  Result Date: 10/27/2019 CLINICAL DATA:  Head trauma and headache EXAM: CT HEAD WITHOUT CONTRAST TECHNIQUE: Contiguous axial images were obtained from the base of the skull through the vertex without intravenous contrast. COMPARISON:  None. FINDINGS: Brain: No evidence of acute infarction, hemorrhage, hydrocephalus, extra-axial collection or mass lesion/mass effect. Vascular: No hyperdense vessel or unexpected calcification. Skull: No skull fracture. Multifocal soft tissue gas within the right masticator and parotid spaces. Right parietal scalp hematoma. Sinuses/Orbits: No acute finding. Other: None. IMPRESSION: 1. No acute intracranial  abnormality. 2. Right parietal scalp hematoma.  No skull fracture. 3. Multifocal soft tissue gas within the right masticator and parotid spaces. This could be a small amount of venous gas related IV infusion, though soft tissue gas related to a paranasal sinus fracture could also cause this appearance. The visualized sinuses are normal, but if there is concern for infraorbital facial fracture, maxillofacial CT should be considered. Electronically Signed   By: Ulyses Jarred M.D.   On: 10/27/2019 22:23   DG Chest Portable 1 View  Result Date: 10/27/2019 CLINICAL DATA:  Hypotension, dizziness, fell EXAM: PORTABLE CHEST 1 VIEW COMPARISON:  11/20/2018 FINDINGS: Single frontal view of the chest demonstrates stable enlargement of the cardiac silhouette. No airspace disease, effusion, or pneumothorax. No acute bony abnormalities. External defibrillator pad overlies left chest. IMPRESSION: 1. Stable enlarged cardiac silhouette. 2. No acute airspace disease. Electronically Signed   By: Randa Ngo M.D.   On: 10/27/2019 21:09   ECHOCARDIOGRAM COMPLETE  Result Date: 10/28/2019    ECHOCARDIOGRAM REPORT   Patient Name:   James Burke Date of Exam: 10/28/2019 Medical Rec #:  [id number]       Height:       76.0 in Accession #:    [id number]      Weight:       195.0 lb Date of Birth:  05/18/1966        BSA:          2.192 m Patient Age:    68 years        BP:           138/77 mmHg Patient Gender: M               HR:           86 bpm. Exam Location:  Inpatient Procedure: 2D Echo and Intracardiac Opacification Agent Indications:    Syncope 780.2 / R55  History:        Patient has prior history of Echocardiogram examinations, most                 recent 04/12/2017. CHF; Risk Factors:Diabetes, Hypertension and                 Dyslipidemia. History of pericardial effusion.  Sonographer:    Vikki Ports Turrentine Referring Phys: [id number] [place]  1. Left ventricular ejection fraction, by estimation, is 40 to  45%. The left ventricle has mildly decreased function. The left ventricle has no regional wall motion abnormalities. The left ventricular internal cavity size was severely dilated. Left ventricular diastolic parameters are consistent with Grade II diastolic dysfunction (pseudonormalization). Elevated left ventricular end-diastolic pressure.  2. Right ventricular systolic function is normal. The right ventricular size is normal. There is mildly elevated pulmonary artery systolic pressure.  3. Left atrial size was severely dilated.  4. Right atrial size was moderately dilated.  5. The mitral valve is normal in structure and function. Trivial mitral valve regurgitation. No evidence of mitral stenosis.  6. The aortic valve is normal in structure and function. Aortic valve regurgitation is not visualized. No aortic stenosis is present.  7. The inferior vena cava is dilated in size with <50% respiratory variability, suggesting right atrial pressure of 15 mmHg. FINDINGS  Left Ventricle: Left ventricular ejection fraction, by estimation, is 40 to 45%. The left ventricle has mildly decreased function. The left ventricle has no regional wall motion abnormalities. Definity contrast agent was given IV to delineate the left ventricular endocardial borders. The left ventricular internal cavity size was severely dilated. There is no left ventricular hypertrophy. Left ventricular diastolic parameters are consistent with Grade II diastolic dysfunction (pseudonormalization). Elevated left ventricular end-diastolic pressure. Right Ventricle: The right ventricular size is normal. No increase in right ventricular wall thickness. Right ventricular systolic function is normal. There is mildly elevated pulmonary artery systolic pressure. The tricuspid regurgitant velocity is 2.32  m/s, and with an assumed right atrial pressure of 15 mmHg, the estimated right ventricular systolic pressure is 76.2 mmHg. Left Atrium: Left atrial size was  severely dilated. Right Atrium: Right atrial size was moderately dilated. Pericardium: There is no evidence of pericardial effusion. Mitral Valve: The mitral valve is normal in structure and function. Normal mobility of the mitral valve leaflets. Trivial mitral valve regurgitation. No evidence of mitral valve stenosis. Tricuspid Valve: The tricuspid valve is normal in structure. Tricuspid valve regurgitation is mild . No evidence of tricuspid stenosis. Aortic Valve: The aortic valve is normal in structure and function. Aortic valve regurgitation is not visualized. No aortic stenosis is present. Pulmonic Valve: The pulmonic valve was normal in structure. Pulmonic valve regurgitation is not visualized. No evidence of pulmonic stenosis. Aorta: The aortic root is normal in size and structure. Venous: The inferior vena cava is dilated in size with less than 50% respiratory variability, suggesting right atrial pressure of 15 mmHg. IAS/Shunts: No atrial level shunt detected by color flow Doppler.  LEFT VENTRICLE PLAX 2D LVIDd:         6.67 cm  Diastology LVIDs:         5.44 cm  LV e' lateral:   10.10 cm/s LV PW:         1.11 cm  LV E/e' lateral: 10.6 LV IVS:        1.14 cm  LV e' medial:    4.70 cm/s LVOT diam:     2.40 cm  LV E/e' medial:  22.8 LV SV:         82.79 ml LV SV Index:   37.78 LVOT Area:     4.52 cm  RIGHT VENTRICLE RV S prime:     4.91 cm/s TAPSE (M-mode): 1.8 cm LEFT ATRIUM              Index       RIGHT ATRIUM  Index LA diam:        5.15 cm  2.35 cm/m  RA Area:     27.00 cm LA Vol (A2C):   129.0 ml 58.86 ml/m RA Volume:   92.60 ml  42.25 ml/m LA Vol (A4C):   100.0 ml 45.63 ml/m LA Biplane Vol: 116.0 ml 52.93 ml/m  AORTIC VALVE LVOT Vmax:   102.00 cm/s LVOT Vmean:  65.500 cm/s LVOT VTI:    0.183 m  AORTA Ao Root diam: 3.20 cm MITRAL VALVE                TRICUSPID VALVE MV Area (PHT): 4.60 cm     TR Peak grad:   21.5 mmHg MV Decel Time: 165 msec     TR Vmax:        232.00 cm/s MV E velocity:  107.00 cm/s MV A velocity: 57.40 cm/s   SHUNTS MV E/A ratio:  1.86         Systemic VTI:  0.18 m                             Systemic Diam: 2.40 cm Fransico Him MD Electronically signed by Fransico Him MD Signature Date/Time: 10/28/2019/6:32:40 PM    Final    DG Hip Unilat W or Wo Pelvis 2-3 Views Right  Result Date: 10/27/2019 CLINICAL DATA:  Hypertension EXAM: DG HIP (WITH OR WITHOUT PELVIS) 2-3V RIGHT COMPARISON:  None. FINDINGS: Bones of the pelvis are intact and congruent. Proximal femora are normally located and intact. Slight cam deformity of the right and femoral head neck. Mild degenerative changes in both hips. Surgical clips project in the right lower quadrant. Phleboliths and vascular calcium noted in the pelvis. Soft tissues are otherwise unremarkable. IMPRESSION: No acute fracture or traumatic malalignment. Mild cam type deformities of the femoral head neck junctions, could correlate for symptoms of femoroacetabular impingement. Electronically Signed   By: Lovena Le M.D.   On: 10/27/2019 22:16   CT MAXILLOFACIAL WO CONTRAST  Result Date: 10/28/2019 CLINICAL DATA:  Fall with head trauma. Abnormal head CT, rule out facial fracture EXAM: CT MAXILLOFACIAL WITHOUT CONTRAST TECHNIQUE: Multidetector CT imaging of the maxillofacial structures was performed. Multiplanar CT image reconstructions were also generated. COMPARISON:  None. FINDINGS: Osseous: Negative for fracture. There is a ground-glass lesion with mild expansion at the angle of the right mandible measuring 13 mm. Normal mandibular location. Orbits: Negative.  No evidence of injury. Sinuses: Clear.  No hemosinus. Soft tissues: Resolved soft tissue gas which was intravenous in location. Limited intracranial: Negative. IMPRESSION: 1. Negative for facial fracture. Soft tissue gas on prior is resolved and was intravenous. 2. 13 mm ground-glass lesion in the right mandible. In the absence of malignancy history, incidental fibrous dysplasia is  favored. Please correlate for any associated pain. Electronically Signed   By: Monte Fantasia M.D.   On: 10/28/2019 09:19   IR Paracentesis  Result Date: 10/28/2019 INDICATION: History of congestive heart failure, end-stage renal disease, cirrhosis with recurrent ascites. Request for therapeutic paracentesis. EXAM: ULTRASOUND GUIDED PARACENTESIS MEDICATIONS: 1% lidocaine 10 mL COMPLICATIONS: None immediate. PROCEDURE: Informed written consent was obtained from the patient after a discussion of the risks, benefits and alternatives to treatment. A timeout was performed prior to the initiation of the procedure. Initial ultrasound scanning demonstrates a large amount of ascites within the right lower abdominal quadrant. The right lower abdomen was prepped and draped in the usual sterile fashion.  1% lidocaine was used for local anesthesia. Following this, a 19 gauge, 7-cm, Yueh catheter was introduced. An ultrasound image was saved for documentation purposes. The paracentesis was performed. The catheter was removed and a dressing was applied. The patient tolerated the procedure well without immediate post procedural complication. FINDINGS: A total of approximately 5.5 L of clear yellow fluid was removed. IMPRESSION: Successful ultrasound-guided paracentesis yielding 5.5 liters of peritoneal fluid. Read by: Gareth Eagle, PA-C Electronically Signed   By: Corrie Mckusick D.O.   On: 10/28/2019 14:07   IR Paracentesis  Result Date: 10/04/2019 INDICATION: Patient with history of end-stage disease on hemodialysis, history of cirrhosis and recurrent ascites. Request for therapeutic paracentesis with 7.5 L maximum. EXAM: ULTRASOUND GUIDED THERAPEUTIC PARACENTESIS MEDICATIONS: 8 mL 1% lidocaine COMPLICATIONS: None immediate. PROCEDURE: Informed written consent was obtained from the patient after a discussion of the risks, benefits and alternatives to treatment. A timeout was performed prior to the initiation of the  procedure. Initial ultrasound scanning demonstrates a large amount of ascites within the right lower abdominal quadrant. The right lower abdomen was prepped and draped in the usual sterile fashion. 1% lidocaine was used for local anesthesia. Following this, a 19 gauge, 7-cm, Yueh catheter was introduced. An ultrasound image was saved for documentation purposes. The paracentesis was performed. The catheter was removed and a dressing was applied. The patient tolerated the procedure well without immediate post procedural complication. FINDINGS: A total of approximately 5.9 L of clear yellow fluid was removed. IMPRESSION: Successful ultrasound-guided paracentesis yielding 5.9 liters of peritoneal fluid. Read by Candiss Norse, PA-C Electronically Signed   By: Sandi Mariscal M.D.   On: 10/04/2019 12:02    Microbiology: Recent Results (from the past 240 hour(s))  SARS CORONAVIRUS 2 (TAT 6-24 HRS) Nasopharyngeal Nasopharyngeal Swab     Status: None   Collection Time: 10/27/19  8:35 PM   Specimen: Nasopharyngeal Swab  Result Value Ref Range Status   SARS Coronavirus 2 NEGATIVE NEGATIVE Final    Comment: (NOTE) SARS-CoV-2 target nucleic acids are NOT DETECTED. The SARS-CoV-2 RNA is generally detectable in upper and lower respiratory specimens during the acute phase of infection. Negative results do not preclude SARS-CoV-2 infection, do not rule out co-infections with other pathogens, and should not be used as the sole basis for treatment or other patient management decisions. Negative results must be combined with clinical observations, patient history, and epidemiological information. The expected result is Negative. Fact Sheet for Patients: SugarRoll.be Fact Sheet for Healthcare Providers: https://www.woods-mathews.com/ This test is not yet approved or cleared by the [place] FDA and  has been authorized for detection and/or diagnosis of SARS-CoV-2  by FDA under an Emergency Use Authorization (EUA). This EUA will remain  in effect (meaning this test can be used) for the duration of the COVID-19 declaration under Section 56 4(b)(1) of the Act, 21 U.S.C. section 360bbb-3(b)(1), unless the authorization is terminated or revoked sooner. Performed at [facility], [place] [address] [place], [place] [address]   Blood culture (routine x 2)     Status: None (Preliminary result)   Collection Time: 10/27/19  8:45 PM   Specimen: BLOOD  Result Value Ref Range Status   Specimen Description BLOOD LEFT ANTECUBITAL  Final   Special Requests   Final    BOTTLES DRAWN AEROBIC AND ANAEROBIC Blood Culture adequate volume   Culture   Final    NO GROWTH 2 DAYS Performed at [facility], [place],  [place] [address]    Report Status PENDING  Incomplete  Blood culture (routine x 2)     Status: None (Preliminary result)   Collection Time: 10/27/19  8:50 PM   Specimen: BLOOD LEFT ARM  Result Value Ref Range Status   Specimen Description BLOOD LEFT ARM  Final   Special Requests   Final    BOTTLES DRAWN AEROBIC AND ANAEROBIC Blood Culture adequate volume   Culture   Final    NO GROWTH 2 DAYS Performed at [facility], [place] [address], [place], [place] [address]    Report Status PENDING  Incomplete     Labs: Basic Metabolic Panel: Recent Labs  Lab 10/27/19 2031 10/27/19 2057 10/28/19 0208 10/28/19 1011 10/29/19 0443  NA 134* 135  134* 136 138 137  K 6.9* 6.8*  6.8* 6.9* 4.4 4.9  CL 94* 101 96* 96* 96*  CO2 21*  --  21* 25 26  GLUCOSE 155* 145* 122* 155* 143*  BUN 84* 93* 87* 45* 60*  CREATININE 11.21* 12.10* 10.91* 6.91* 8.79*  CALCIUM 8.8*  --  8.7* 8.2* 8.4*  MG 2.5*  --   --   --  2.1   Liver Function Tests: Recent Labs  Lab 10/27/19 2031  AST 18  ALT 17  ALKPHOS 69  BILITOT 0.8  PROT 7.6  ALBUMIN 3.6   No results for input(s): LIPASE, AMYLASE in the last 168 hours. No results for input(s):  AMMONIA in the last 168 hours. CBC: Recent Labs  Lab 10/27/19 2031 10/27/19 2057 10/28/19 1011 10/29/19 0443  WBC 9.0  --  8.8 7.3  NEUTROABS 6.8  --  6.7 5.1  HGB 10.2* 10.9*  10.5* 8.7* 9.1*  HCT 32.9* 32.0*  31.0* 26.6* 28.7*  MCV 92.4  --  88.7 90.5  PLT 231  --  193 188   Cardiac Enzymes: No results for input(s): CKTOTAL, CKMB, CKMBINDEX, TROPONINI in the last 168 hours. BNP: BNP (last 3 results) Recent Labs    10/27/19 2031  BNP 1,132.3*    ProBNP (last 3 results) No results for input(s): PROBNP in the last 8760 hours.  CBG: Recent Labs  Lab 10/28/19 0803 10/28/19 1117 10/28/19 1606 10/28/19 2133 10/29/19 0701  GLUCAP 171* 139* 150* 209* 152*       Signed:  Alma Friendly, MD [facility] 10/29/2019, 10:54 AM

## 2019-10-29 NOTE — Plan of Care (Signed)
  Problem: Activity: Goal: Risk for activity intolerance will decrease Outcome: Progressing   Problem: Pain Managment: Goal: General experience of comfort will improve Outcome: Progressing   

## 2019-11-05 LAB — CULTURE, BLOOD (ROUTINE X 2)
Culture: NO GROWTH
Culture: NO GROWTH
Special Requests: ADEQUATE
Special Requests: ADEQUATE

## 2019-11-08 ENCOUNTER — Encounter: Payer: Self-pay | Admitting: *Deleted

## 2019-11-11 ENCOUNTER — Other Ambulatory Visit: Payer: Self-pay | Admitting: Nephrology

## 2019-11-11 ENCOUNTER — Other Ambulatory Visit (HOSPITAL_COMMUNITY): Payer: Self-pay | Admitting: Nephrology

## 2019-11-11 ENCOUNTER — Encounter: Payer: Self-pay | Admitting: *Deleted

## 2019-11-11 DIAGNOSIS — E8770 Fluid overload, unspecified: Secondary | ICD-10-CM

## 2019-11-14 ENCOUNTER — Encounter: Payer: Self-pay | Admitting: Family Medicine

## 2019-11-14 ENCOUNTER — Other Ambulatory Visit: Payer: Self-pay

## 2019-11-14 ENCOUNTER — Ambulatory Visit (HOSPITAL_COMMUNITY)
Admission: RE | Admit: 2019-11-14 | Discharge: 2019-11-14 | Disposition: A | Discharge status: Home / Self Care | Payer: Medicare Other | Source: Ambulatory Visit | Attending: Nephrology | Admitting: Nephrology

## 2019-11-14 ENCOUNTER — Ambulatory Visit (INDEPENDENT_AMBULATORY_CARE_PROVIDER_SITE_OTHER): Payer: Medicare Other | Admitting: Family Medicine

## 2019-11-14 VITALS — BP 142/74 | HR 77 | Ht 72.0 in | Wt 201.0 lb

## 2019-11-14 DIAGNOSIS — S0990XA Unspecified injury of head, initial encounter: Secondary | ICD-10-CM

## 2019-11-14 DIAGNOSIS — E559 Vitamin D deficiency, unspecified: Secondary | ICD-10-CM

## 2019-11-14 DIAGNOSIS — G8929 Other chronic pain: Secondary | ICD-10-CM

## 2019-11-14 DIAGNOSIS — R188 Other ascites: Secondary | ICD-10-CM | POA: Insufficient documentation

## 2019-11-14 DIAGNOSIS — S299XXA Unspecified injury of thorax, initial encounter: Secondary | ICD-10-CM

## 2019-11-14 DIAGNOSIS — E8770 Fluid overload, unspecified: Secondary | ICD-10-CM

## 2019-11-14 DIAGNOSIS — Z5189 Encounter for other specified aftercare: Secondary | ICD-10-CM

## 2019-11-14 DIAGNOSIS — S4991XA Unspecified injury of right shoulder and upper arm, initial encounter: Secondary | ICD-10-CM

## 2019-11-14 DIAGNOSIS — M25551 Pain in right hip: Secondary | ICD-10-CM | POA: Diagnosis not present

## 2019-11-14 HISTORY — PX: IR PARACENTESIS: IMG2679

## 2019-11-14 MED ORDER — OXYCODONE-ACETAMINOPHEN 10-325 MG PO TABS: 1.0000 | ORAL_TABLET | ORAL | 0 refills | Status: DC | PRN

## 2019-11-14 MED ORDER — LIDOCAINE HCL 1 % IJ SOLN
INTRAMUSCULAR | Status: AC
  Filled 2019-11-14: qty 20

## 2019-11-14 MED ORDER — LIDOCAINE HCL (PF) 1 % IJ SOLN
INTRAMUSCULAR | Status: DC | PRN
  Administered 2019-11-14: 10 mL

## 2019-11-14 NOTE — Procedures (Signed)
Ultrasound-guided therapeutic paracentesis performed yielding 5.7 liters of amber fluid. No immediate complications. EBL none.

## 2019-11-14 NOTE — Progress Notes (Addendum)
Established Patient Office Visit  Subjective:  Patient ID: James Burke, male    DOB: 13-Dec-1965  Age: 54 y.o. MRN: [id number]  CC:  Chief Complaint  Patient presents with  . Hospitalization Follow-up    HPI James Burke presents for pain in his Right shoulder  Right ribs. Right hip and Right  Side of head.  He became hypotension and bradycardic after dialysis and fell to  The side with  His arm behind him pinching his shoulder.  He had a couple of lacerations to his scalp.  He has been keeping that areas covered.    His ribs are painful with cough. Was given some pain meds but has cut back on those, he does have a bruise on his hip.    He is scheduled for paracentesis.  The extra fluid in his abdomen is making his rib hurt.    Also do for refill on chronic pain medication.   Past Medical History:  Diagnosis Date  . Anemia   . Arthritis, septic, knee ([place])   . C. difficile colitis 04/18/2008  . Congestive heart failure ([place])   . Diabetes mellitus    Type II  . DVT (deep venous thrombosis) ([facility])    right leg   . Dyspnea    "when I have too much fluid.'  . ESRD (end stage renal disease) ([place])    Tues, Thurs  . GERD (gastroesophageal reflux disease)   . History of blood transfusion 2017  . Pneumonia     Past Surgical History:  Procedure Laterality Date  . APPENDECTOMY  1995  . BASCILIC VEIN TRANSPOSITION Left 01/03/2017   Procedure: FIRST STAGE BRACHIAL VEIN TRANSPOSITION;  Surgeon: Conrad Belgreen, MD;  Location: [facility];  Service: Vascular;  Laterality: Left;  . BASCILIC VEIN TRANSPOSITION Left 04/24/2017   Procedure: LEFT SECOND STAGE BRACHIAL VEIN TRANSPOSITION;  Surgeon: Conrad Glenbeulah, MD;  Location: [facility];  Service: Vascular;  Laterality: Left;  . EYE SURGERY    . INSERTION OF DIALYSIS CATHETER Right 01/03/2017   Procedure: INSERTION OF DIALYSIS CATHETER RIGHT INTERNAL JUGULAR;  Surgeon: Conrad Williamston, MD;  Location: [facility];  Service: Vascular;  Laterality: Right;  .  IR PARACENTESIS  12/14/2017  . IR PARACENTESIS  02/01/2018  . IR PARACENTESIS  03/28/2018  . IR PARACENTESIS  05/17/2018  . IR PARACENTESIS  05/31/2018  . IR PARACENTESIS  06/12/2018  . IR PARACENTESIS  06/26/2018  . IR PARACENTESIS  07/10/2018  . IR PARACENTESIS  07/30/2018  . IR PARACENTESIS  08/30/2018  . IR PARACENTESIS  09/13/2018  . IR PARACENTESIS  09/27/2018  . IR PARACENTESIS  10/11/2018  . IR PARACENTESIS  10/30/2018  . IR PARACENTESIS  11/29/2018  . IR PARACENTESIS  01/22/2019  . IR PARACENTESIS  02/21/2019  . IR PARACENTESIS  03/21/2019  . IR PARACENTESIS  04/02/2019  . IR PARACENTESIS  04/18/2019  . IR PARACENTESIS  05/02/2019  . IR PARACENTESIS  05/16/2019  . IR PARACENTESIS  05/30/2019  . IR PARACENTESIS  06/14/2019  . IR PARACENTESIS  06/27/2019  . IR PARACENTESIS  07/11/2019  . IR PARACENTESIS  07/25/2019  . IR PARACENTESIS  08/06/2019  . IR PARACENTESIS  08/15/2019  . IR PARACENTESIS  08/29/2019  . IR PARACENTESIS  09/19/2019  . IR PARACENTESIS  10/04/2019  . IR PARACENTESIS  10/28/2019  . IR PARACENTESIS  11/14/2019  . KNEE SURGERY Left 04/2014  . [place]  Bilateral   . PERICARDIOCENTESIS N/A 01/03/2017  Procedure: Pericardiocentesis;  Surgeon: Burnell Blanks, MD;  Location: [facility];  Service: Cardiovascular;  Laterality: N/A;    Family History  Problem Relation Age of Onset  . Heart disease Other        No family history  . Cancer Brother     Social History   Socioeconomic History  . Marital status: Married    Spouse name: Not on file  . Number of children: 3  . Years of education: Not on file  . Highest education level: Not on file  Occupational History  . Not on file  Tobacco Use  . Smoking status: Never Smoker  . Smokeless tobacco: Former Systems developer    Types: Chew  . Tobacco comment: no chewing 01/2017  Substance and Sexual Activity  . Alcohol use: No    Alcohol/week: 0.0 standard drinks  . Drug use: No  . Sexual activity: Yes  Other Topics  Concern  . Not on file  Social History Narrative  . Not on file   Social Determinants of Health   Financial Resource Strain:   . Difficulty of Paying Living Expenses:   Food Insecurity:   . Worried About Charity fundraiser in the Last Year:   . Arboriculturist in the Last Year:   Transportation Needs:   . Film/video editor (Medical):   Marland Kitchen Lack of Transportation (Non-Medical):   Physical Activity:   . Days of Exercise per Week:   . Minutes of Exercise per Session:   Stress:   . Feeling of Stress :   Social Connections:   . Frequency of Communication with Friends and Family:   . Frequency of Social Gatherings with Friends and Family:   . Attends Religious Services:   . Active Member of Clubs or Organizations:   . Attends Archivist Meetings:   Marland Kitchen Marital Status:   Intimate Partner Violence:   . Fear of Current or Ex-Partner:   . Emotionally Abused:   Marland Kitchen Physically Abused:   . Sexually Abused:     Outpatient Medications Prior to Visit  Medication Sig Dispense Refill  . acetaminophen (TYLENOL) 500 MG tablet Take 1,000 mg by mouth See admin instructions. Take 2 tablets (1000 mg) by mouth during dialysis on Monday, Wednesday, Friday, take 2 tablets (1000 mg) daily at bedtime    . amLODipine (NORVASC) 10 MG tablet TAKE 1 TABLET BY MOUTH EVERY DAY (Patient taking differently: Take 10 mg by mouth at bedtime. ) 90 tablet 3  . aspirin EC 81 MG tablet Take 81 mg by mouth See admin instructions. Take one tablet (81 mg) by mouth every other night    . b complex-vitamin c-folic acid (NEPHRO-VITE) 0.8 MG TABS tablet Take 1 tablet by mouth at bedtime.   6  . carvedilol (COREG) 25 MG tablet TAKE 1 TABLET TWICE A DAY WITH FOOD (Patient taking differently: Take 25 mg by mouth in the morning and at bedtime. ) 180 tablet 1  . diphenhydrAMINE (BENADRYL) 25 MG tablet Take 25 mg by mouth See admin instructions. Take 2 tablets (50 mg) by mouth during dialysis on Monday, Wednesday, Friday,  take 1-2 tablets (25-50 mg) daily at bedtime    . ferric citrate (AURYXIA) 1 GM 210 MG(Fe) tablet Take 210-420 mg by mouth See admin instructions. Take 2 tablets (420 mg) by mouth three times daily with meals, take 1 tablet (210 mg) with snacks    . furosemide (LASIX) 80 MG tablet TAKE 1  TABLET (80 MG TOTAL) BY MOUTH 2 (TWO) TIMES DAILY. (Patient taking differently: Take 80-160 mg by mouth See admin instructions. Take 2 tablets (160 mg) by mouth twice daily, may take an additional 1 or 2 tablets (80-160 mg) as needed for bloating) 180 tablet 3  . gabapentin (NEURONTIN) 100 MG capsule Take 2 capsules (200 mg total) by mouth 2 (two) times daily as needed (Give one hour prior to hemodialysis session as needed for anxiety/restlessness). (Patient taking differently: Take 100-200 mg by mouth See admin instructions. On Monday, Wednesday, Friday (dialysis days) - take 2 capsules (200 mg) by mouth before dialysis and take 1 capsule (100 mg) at bedtime; on Sunday, Tuesday, Thursday, Saturday (non-dialysis days) - take 1 capsule (100 mg) twice daily) 220 capsule 0  . hydrALAZINE (APRESOLINE) 25 MG tablet TAKE 1 TABLET (25 MG TOTAL) 3 (THREE) TIMES DAILY BY MOUTH. (Patient taking differently: Take 25 mg by mouth in the morning and at bedtime. ) 90 tablet 1  . hydrOXYzine (ATARAX/VISTARIL) 25 MG tablet Take 25 mg by mouth 2 (two) times daily.     . isosorbide mononitrate (IMDUR) 30 MG 24 hr tablet Take 1 tablet (30 mg total) by mouth daily. (Patient taking differently: Take 30 mg by mouth at bedtime. ) 90 tablet 3  . lidocaine-prilocaine (EMLA) cream APPLY THREE TIMES A WEEK AS DIRECTED 1 HOUR PRIOR TO DIALYSIS AND WRAP WITH PLASTIC WRAP (Patient taking differently: Apply 1 application topically See admin instructions. Apply topically one hour before dialysis on Monday, Wednesday, Friday (cover with plastic wrap)) 30 g 3  . Methoxy PEG-Epoetin Beta (MIRCERA IJ) Mircera    . omeprazole (PRILOSEC) 40 MG capsule TAKE 1  CAPSULE BY MOUTH EVERY DAY (Patient taking differently: Take 40 mg by mouth daily. ) 90 capsule 1  . polyvinyl alcohol (ARTIFICIAL TEARS) 1.4 % ophthalmic solution Place 1 drop into both eyes at bedtime as needed for dry eyes (itching).    Marland Kitchen rOPINIRole (REQUIP) 0.25 MG tablet TAKE 1 TO 2 TABLETS AT BEDTIME (Patient taking differently: Take 0.5 mg by mouth See admin instructions. Take 2 tablets (0.5 mg) by mouth before dialysis on Monday, Wednesday, Friday, take 2 tablets (0.5 mg) daily at bedtime) 180 tablet 1  . tamsulosin (FLOMAX) 0.4 MG CAPS capsule TAKE 2 CAPSULES BY MOUTH DAILY AFTER SUPPER (Patient taking differently: Take 0.8 mg by mouth at bedtime. ) 180 capsule 2  . traZODone (DESYREL) 50 MG tablet TAKE 1 TABLET AT BEDTIME (Patient taking differently: Take 50 mg by mouth at bedtime. ) 90 tablet 0  . TRULICITY 2.25 JD/0.5XG SOPN INJECT 0.75MG EVERY 7 DAYS (Patient taking differently: Inject 0.75 mg into the skin every Saturday. ) 6 pen 6  . Vitamin D, Ergocalciferol, (DRISDOL) 1.25 MG (50000 UNIT) CAPS capsule Take 50,000 Units by mouth every 7 (seven) days.    Marland Kitchen oxyCODONE-acetaminophen (PERCOCET) 10-325 MG tablet Take 1 tablet by mouth every 4 (four) hours as needed for pain. (Patient taking differently: Take 1 tablet by mouth See admin instructions. Take 1 tablet by mouth after dialysis on Monday, Wednesday, Friday, take 1 tablet daily at bedtime, may also take 1 tablet at 5 pm as needed for pain) 90 tablet 0   No facility-administered medications prior to visit.    Allergies  Allergen Reactions  . Prednisone Other (See Comments)    BLINDNESS > [ ? CSCR ? ]  . Tape Other (See Comments)    Paper tape causes blisters   . Simvastatin Other (  See Comments)    Myalgia, joint pain  . Lorazepam Anxiety and Other (See Comments)    Nervousness and "fidgets"; legs "need to kick, "electrical currents"  . Methocarbamol Diarrhea  . Seroquel [Quetiapine Fumarate] Other (See Comments)    QT  prolongation    ROS Review of Systems    Objective:    Physical Exam  Constitutional: He is oriented to person, place, and time. He appears well-developed and well-nourished.  HENT:  Head: Normocephalic and atraumatic.  Cardiovascular: Normal rate, regular rhythm and normal heart sounds.  Pulmonary/Chest: Effort normal and breath sounds normal.  Musculoskeletal:     Comments: Right shoulder with NROM.  Tender laterally.    Neurological: He is alert and oriented to person, place, and time.  Skin: Skin is warm and dry.  Laceration on right side of head are healing well. They are clean dry and intact with no erythema.   Psychiatric: He has a normal mood and affect. His behavior is normal.    BP (!) 142/74   Pulse 77   Ht 6' (1.829 m)   Wt 201 lb (91.2 kg)   SpO2 99%   BMI 27.26 kg/m  Wt Readings from Last 3 Encounters:  11/14/19 201 lb (91.2 kg)  10/27/19 195 lb (88.5 kg)  10/22/19 216 lb (98 kg)     Health Maintenance Due  Topic Date Due  . COLONOSCOPY  05/16/2011  . OPHTHALMOLOGY EXAM  10/06/2018    There are no preventive care reminders to display for this patient.  Lab Results  Component Value Date   TSH 4.260 10/27/2019   Lab Results  Component Value Date   WBC 7.3 10/29/2019   HGB 9.1 (L) 10/29/2019   HCT 28.7 (L) 10/29/2019   MCV 90.5 10/29/2019   PLT 188 10/29/2019   Lab Results  Component Value Date   NA 137 10/29/2019   K 4.9 10/29/2019   CO2 26 10/29/2019   GLUCOSE 143 (H) 10/29/2019   BUN 60 (H) 10/29/2019   CREATININE 8.79 (H) 10/29/2019   BILITOT 0.8 10/27/2019   ALKPHOS 69 10/27/2019   AST 18 10/27/2019   ALT 17 10/27/2019   PROT 7.6 10/27/2019   ALBUMIN 3.6 10/27/2019   CALCIUM 8.4 (L) 10/29/2019   ANIONGAP 15 10/29/2019   EGFR 26.0 04/16/2014   Lab Results  Component Value Date   CHOL 115 04/06/2018   Lab Results  Component Value Date   HDL 31 (L) 04/06/2018   Lab Results  Component Value Date   LDLCALC 62 04/06/2018    Lab Results  Component Value Date   TRIG 141 04/06/2018   Lab Results  Component Value Date   CHOLHDL 3.7 04/06/2018   Lab Results  Component Value Date   HGBA1C 7.5 (A) 10/22/2019      Assessment & Plan:   Problem List Items Addressed This Visit      Other   Encounter for chronic pain management    Refill oxycodone today.  Indication for chronic opioid: Chronic back pain and Charcot foot. Medication and dose: Percocet 10/325 # pills per month: 90 Last UDS date: 10/22/2019       Opioid Treatment Agreement signed (Y/N): Y Opioid Treatment Agreement last reviewed with patient:   NCCSRS reviewed this encounter (include red flags):  Yes       Other Visit Diagnoses    Injury of head, initial encounter    -  Primary   Injury of right shoulder, initial encounter  Rib injury       Right hip pain       Vitamin D deficiency       Encounter for wound care          Head injury, Lacerations are healing well. Wounds are clean dry and intake.    Right shoulder injury - continue ice and rest.  Consider referral to sports med if not improving over next 2-3 weeks.    Right hip pain- contusion healing  Vit D def - has been on weekly vitamin D x 6 mo.    Wound care - apply Vaseline to scabs to moisturize the.   Hypotension - resolved.   Meds ordered this encounter  Medications  . oxyCODONE-acetaminophen (PERCOCET) 10-325 MG tablet    Sig: Take 1 tablet by mouth every 4 (four) hours as needed for pain.    Dispense:  90 tablet    Refill:  0    Follow-up: Return if symptoms worsen or fail to improve.    [name], MD

## 2019-11-17 ENCOUNTER — Encounter: Payer: Self-pay | Admitting: Family Medicine

## 2019-11-17 NOTE — Assessment & Plan Note (Signed)
Refill oxycodone today.  Indication for chronic opioid: Chronic back pain and Charcot foot. Medication and dose: Percocet 10/325 # pills per month: 90 Last UDS date: 10/22/2019       Opioid Treatment Agreement signed (Y/N): Y Opioid Treatment Agreement last reviewed with patient:   NCCSRS reviewed this encounter (include red flags):  Yes

## 2019-11-24 ENCOUNTER — Other Ambulatory Visit: Payer: Self-pay | Admitting: Family Medicine

## 2019-11-28 ENCOUNTER — Ambulatory Visit: Payer: Medicare Other

## 2019-11-28 ENCOUNTER — Other Ambulatory Visit: Payer: Self-pay | Admitting: Family Medicine

## 2019-12-02 ENCOUNTER — Other Ambulatory Visit: Payer: Self-pay | Admitting: Nephrology

## 2019-12-02 ENCOUNTER — Other Ambulatory Visit (HOSPITAL_COMMUNITY): Payer: Self-pay | Admitting: Nephrology

## 2019-12-02 DIAGNOSIS — E8770 Fluid overload, unspecified: Secondary | ICD-10-CM

## 2019-12-03 ENCOUNTER — Ambulatory Visit (HOSPITAL_COMMUNITY)
Admission: RE | Admit: 2019-12-03 | Discharge: 2019-12-03 | Disposition: A | Discharge status: Home / Self Care | Payer: Medicare Other | Source: Ambulatory Visit | Attending: Nephrology | Admitting: Nephrology

## 2019-12-03 ENCOUNTER — Other Ambulatory Visit: Payer: Self-pay

## 2019-12-03 DIAGNOSIS — E8770 Fluid overload, unspecified: Secondary | ICD-10-CM

## 2019-12-03 DIAGNOSIS — R188 Other ascites: Secondary | ICD-10-CM | POA: Diagnosis not present

## 2019-12-03 HISTORY — PX: IR PARACENTESIS: IMG2679

## 2019-12-03 MED ORDER — LIDOCAINE HCL 1 % IJ SOLN
INTRAMUSCULAR | Status: AC
  Filled 2019-12-03: qty 20

## 2019-12-03 MED ORDER — LIDOCAINE HCL (PF) 1 % IJ SOLN
INTRAMUSCULAR | Status: DC | PRN
  Administered 2019-12-03: 10 mL

## 2019-12-03 NOTE — Procedures (Signed)
PROCEDURE SUMMARY:  Successful image-guided paracentesis from the right lower abdomen.  Yielded 5.4 liters of clear yellow fluid.  No immediate complications.  EBL: zero Patient tolerated well.   Specimen was not sent for labs.  Please see imaging section of Epic for full dictation.  Joaquim Nam PA-C 12/03/2019 9:20 AM

## 2019-12-05 ENCOUNTER — Encounter: Payer: Self-pay | Admitting: Family Medicine

## 2019-12-09 ENCOUNTER — Telehealth: Payer: Self-pay

## 2019-12-09 ENCOUNTER — Encounter: Payer: Self-pay | Admitting: Family Medicine

## 2019-12-09 MED ORDER — OXYCODONE-ACETAMINOPHEN 10-325 MG PO TABS: 1.0000 | ORAL_TABLET | Freq: Three times a day (TID) | ORAL | 0 refills | Status: DC | PRN

## 2019-12-09 NOTE — Telephone Encounter (Signed)
Pt called requesting for provider to authorize early release for oxycodone rx. As per pt, provider was aware pt was taking more than directed due to fall last month.Pt will be taking his last pill tonight and will be out of medication. Pt does not want to wait until 12/13/19. Pls send response to [facility] pool. Thanks.

## 2019-12-09 NOTE — Telephone Encounter (Signed)
Last RX sent 11/14/19 for #90, 1 Q4H PRN Pain

## 2019-12-09 NOTE — Telephone Encounter (Signed)
Med sent. Ok o refill on 9th

## 2019-12-10 MED ORDER — OXYCODONE-ACETAMINOPHEN 10-325 MG PO TABS: 1.0000 | ORAL_TABLET | Freq: Three times a day (TID) | ORAL | 0 refills | Status: DC | PRN

## 2019-12-10 NOTE — Telephone Encounter (Signed)
Dr. Madilyn Fireman and I have agreed that I will receive the following:  Medication Dosage Quantity Days Direction  Oxycodone-Acetaminophen 10-325 90 30 Take 1 tablet by mouth every 4 (four) hours as needed for pain.      Above is copy of pain contract. OK to advise patient that he must adhere to this.

## 2019-12-10 NOTE — Telephone Encounter (Signed)
New rx sent. May nee to call pharm and let them know ok to dispense med today.

## 2019-12-10 NOTE — Telephone Encounter (Signed)
At provider's request, contacted [facility], spoke directly to pharmacist regarding early pick up for oxycodone-ace. Pharmacist has agree to get rx ready for pick up today. Pt has been updated. No other inquiries during call.

## 2019-12-14 ENCOUNTER — Encounter (HOSPITAL_COMMUNITY): Payer: Self-pay | Admitting: Emergency Medicine

## 2019-12-14 ENCOUNTER — Emergency Department (HOSPITAL_COMMUNITY): Payer: Medicare Other

## 2019-12-14 ENCOUNTER — Other Ambulatory Visit: Payer: Self-pay

## 2019-12-14 ENCOUNTER — Emergency Department (HOSPITAL_COMMUNITY)
Admission: EM | Admit: 2019-12-14 | Discharge: 2019-12-15 | Disposition: A | Discharge status: Home / Self Care | Payer: Medicare Other | Attending: Emergency Medicine | Admitting: Emergency Medicine

## 2019-12-14 DIAGNOSIS — I5042 Chronic combined systolic (congestive) and diastolic (congestive) heart failure: Secondary | ICD-10-CM | POA: Insufficient documentation

## 2019-12-14 DIAGNOSIS — E1122 Type 2 diabetes mellitus with diabetic chronic kidney disease: Secondary | ICD-10-CM | POA: Diagnosis not present

## 2019-12-14 DIAGNOSIS — U071 COVID-19: Secondary | ICD-10-CM

## 2019-12-14 DIAGNOSIS — I132 Hypertensive heart and chronic kidney disease with heart failure and with stage 5 chronic kidney disease, or end stage renal disease: Secondary | ICD-10-CM | POA: Diagnosis not present

## 2019-12-14 DIAGNOSIS — N186 End stage renal disease: Secondary | ICD-10-CM

## 2019-12-14 DIAGNOSIS — J029 Acute pharyngitis, unspecified: Secondary | ICD-10-CM | POA: Insufficient documentation

## 2019-12-14 DIAGNOSIS — E875 Hyperkalemia: Secondary | ICD-10-CM | POA: Insufficient documentation

## 2019-12-14 DIAGNOSIS — Z7984 Long term (current) use of oral hypoglycemic drugs: Secondary | ICD-10-CM | POA: Diagnosis not present

## 2019-12-14 LAB — CBC WITH DIFFERENTIAL/PLATELET
Abs Immature Granulocytes: 0.03 10*3/uL (ref 0.00–0.07)
Basophils Absolute: 0.1 10*3/uL (ref 0.0–0.1)
Basophils Relative: 1 %
Eosinophils Absolute: 0.2 10*3/uL (ref 0.0–0.5)
Eosinophils Relative: 5 %
HCT: 34.3 % — ABNORMAL LOW (ref 39.0–52.0)
Hemoglobin: 10.6 g/dL — ABNORMAL LOW (ref 13.0–17.0)
Immature Granulocytes: 1 %
Lymphocytes Relative: 15 %
Lymphs Abs: 0.8 10*3/uL (ref 0.7–4.0)
MCH: 29.3 pg (ref 26.0–34.0)
MCHC: 30.9 g/dL (ref 30.0–36.0)
MCV: 94.8 fL (ref 80.0–100.0)
Monocytes Absolute: 0.7 10*3/uL (ref 0.1–1.0)
Monocytes Relative: 15 %
Neutro Abs: 3.2 10*3/uL (ref 1.7–7.7)
Neutrophils Relative %: 63 %
Platelets: 209 10*3/uL (ref 150–400)
RBC: 3.62 MIL/uL — ABNORMAL LOW (ref 4.22–5.81)
RDW: 14.1 % (ref 11.5–15.5)
WBC: 5 10*3/uL (ref 4.0–10.5)
nRBC: 0 % (ref 0.0–0.2)

## 2019-12-14 LAB — BASIC METABOLIC PANEL
Anion gap: 15 (ref 5–15)
BUN: 41 mg/dL — ABNORMAL HIGH (ref 6–20)
CO2: 27 mmol/L (ref 22–32)
Calcium: 8.5 mg/dL — ABNORMAL LOW (ref 8.9–10.3)
Chloride: 96 mmol/L — ABNORMAL LOW (ref 98–111)
Creatinine, Ser: 7.16 mg/dL — ABNORMAL HIGH (ref 0.61–1.24)
GFR calc Af Amer: 9 mL/min — ABNORMAL LOW (ref >60)
GFR calc non Af Amer: 8 mL/min — ABNORMAL LOW (ref >60)
Glucose, Bld: 156 mg/dL — ABNORMAL HIGH (ref 70–99)
Potassium: 5.2 mmol/L — ABNORMAL HIGH (ref 3.5–5.1)
Sodium: 138 mmol/L (ref 135–145)

## 2019-12-14 MED ORDER — SODIUM ZIRCONIUM CYCLOSILICATE 5 G PO PACK
5.0000 g | PACK | Freq: Once | ORAL | Status: AC
  Administered 2019-12-14: 5 g via ORAL
  Filled 2019-12-14: qty 1

## 2019-12-14 NOTE — ED Provider Notes (Signed)
[facility] Provider Note   CSN: [id number] Arrival date & time: 12/14/19  1939     History Chief Complaint  Patient presents with  . COVID +    [facility] is a 54 y.o. male hx of ESRD on HD (last HD yesterday), here presenting with positive Covid.  He has some sore throat 2 days ago.  His wife and himself both tested positive for Covid.  Patient states that he did have 1 shot of his Covid vaccine several weeks ago.  He denies any fevers and has no shortness of breath.  Denies any vomiting.  The history is provided by the patient.       Past Medical History:  Diagnosis Date  . Anemia   . Arthritis, septic, knee ([place])   . C. difficile colitis 04/18/2008  . Congestive heart failure ([place])   . Diabetes mellitus    Type II  . DVT (deep venous thrombosis) (H[facility])    right leg   . Dyspnea    "when I have too much fluid.'  . ESRD (end stage renal disease) ([place])    Tues, Thurs  . GERD (gastroesophageal reflux disease)   . History of blood transfusion 2017  . Pneumonia     Patient Active Problem List   Diagnosis Date Noted  . Symptomatic bradycardia 10/27/2019  . Ascites 10/27/2019  . Headache, unspecified 07/22/2019  . Pruritus, unspecified 05/10/2019  . Primary osteoarthritis of left knee 12/27/2018  . Hyperkalemia, diminished renal excretion 11/20/2018  . Mild protein-calorie malnutrition ([place]) 10/29/2018  . History of Clostridium difficile colitis 08/23/2018  . Secondary hyperparathyroidism of renal origin ([place]) 04/06/2018  . Anxiety 07/31/2017  . Encounter for chronic pain management 06/15/2017  . Embolism due to vascular prosthetic devices, implants and grafts, subsequent encounter 01/31/2017  . Iron deficiency anemia, unspecified 01/26/2017  . Dependence on renal dialysis ([place]) 01/23/2017  . Coagulation defect, unspecified ([place]) 01/14/2017  . Anemia in chronic kidney disease 01/09/2017  . Pericardial effusion without  cardiac tamponade   . ESRD (end stage renal disease) on dialysis ([place])   . Hypertensive heart and chronic kidney disease with heart failure and stage 1 through stage 4 chronic kidney disease, or chronic kidney disease ([place])   . Acute on chronic combined systolic and diastolic congestive heart failure ([place]) 12/30/2016  . BPH (benign prostatic hyperplasia) 12/30/2016  . Charcot foot due to diabetes mellitus ([place]) 10/23/2015  . Nonischemic cardiomyopathy ([place]) 09/17/2014  . Insomnia 07/16/2014  . Bilateral pleural effusion 06/10/2014  . Constipation 04/20/2014  . Chronic combined systolic and diastolic heart failure ([place]) 04/17/2014  . Type 2 diabetes mellitus with diabetic chronic kidney disease ([place]) 04/17/2014  . Leukocytosis 04/17/2014  . Microcytic anemia 04/17/2014  . Mild pulmonary hypertension ([place]) 04/17/2014  . Vitiligo 03/13/2014  . Diabetic retinopathy associated with type 2 diabetes mellitus ([place]) 01/12/2012  . Hypertriglyceridemia 01/12/2012  . Essential hypertension, benign 01/12/2012  . Elevated LFTs 01/12/2012  . Controlled type 2 diabetes mellitus with renal manifestation ([place]) 01/03/2012  . Chronic systolic CHF (congestive heart failure), NYHA class 1 ([place]) 01/03/2012    Past Surgical History:  Procedure Laterality Date  . APPENDECTOMY  1995  . BASCILIC VEIN TRANSPOSITION Left 01/03/2017   Procedure: FIRST STAGE BRACHIAL VEIN TRANSPOSITION;  Surgeon: Conrad [place], MD;  Location: [facility];  Service: Vascular;  Laterality: Left;  . BASCILIC VEIN TRANSPOSITION Left 04/24/2017   Procedure: LEFT SECOND STAGE BRACHIAL VEIN TRANSPOSITION;  Surgeon:  Conrad [place], MD;  Location: [facility];  Service: Vascular;  Laterality: Left;  . EYE SURGERY    . INSERTION OF DIALYSIS CATHETER Right 01/03/2017   Procedure: INSERTION OF DIALYSIS CATHETER RIGHT INTERNAL JUGULAR;  Surgeon: Conrad [place], MD;  Location: [facility];  Service: Vascular;  Laterality: Right;  . IR PARACENTESIS  12/14/2017  . IR  PARACENTESIS  02/01/2018  . IR PARACENTESIS  03/28/2018  . IR PARACENTESIS  05/17/2018  . IR PARACENTESIS  05/31/2018  . IR PARACENTESIS  06/12/2018  . IR PARACENTESIS  06/26/2018  . IR PARACENTESIS  07/10/2018  . IR PARACENTESIS  07/30/2018  . IR PARACENTESIS  08/30/2018  . IR PARACENTESIS  09/13/2018  . IR PARACENTESIS  09/27/2018  . IR PARACENTESIS  10/11/2018  . IR PARACENTESIS  10/30/2018  . IR PARACENTESIS  11/29/2018  . IR PARACENTESIS  01/22/2019  . IR PARACENTESIS  02/21/2019  . IR PARACENTESIS  03/21/2019  . IR PARACENTESIS  04/02/2019  . IR PARACENTESIS  04/18/2019  . IR PARACENTESIS  05/02/2019  . IR PARACENTESIS  05/16/2019  . IR PARACENTESIS  05/30/2019  . IR PARACENTESIS  06/14/2019  . IR PARACENTESIS  06/27/2019  . IR PARACENTESIS  07/11/2019  . IR PARACENTESIS  07/25/2019  . IR PARACENTESIS  08/06/2019  . IR PARACENTESIS  08/15/2019  . IR PARACENTESIS  08/29/2019  . IR PARACENTESIS  09/19/2019  . IR PARACENTESIS  10/04/2019  . IR PARACENTESIS  10/28/2019  . IR PARACENTESIS  11/14/2019  . IR PARACENTESIS  12/03/2019  . KNEE SURGERY Left 04/2014  . [place]  Bilateral   . PERICARDIOCENTESIS N/A 01/03/2017   Procedure: Pericardiocentesis;  Surgeon: Burnell Blanks, MD;  Location: [facility];  Service: Cardiovascular;  Laterality: N/A;       Family History  Problem Relation Age of Onset  . Heart disease Other        No family history  . Cancer Brother     Social History   Tobacco Use  . Smoking status: Never Smoker  . Smokeless tobacco: Former Systems developer    Types: Chew  . Tobacco comment: no chewing 01/2017  Substance Use Topics  . Alcohol use: No    Alcohol/week: 0.0 standard drinks  . Drug use: No    Home Medications Prior to Admission medications   Medication Sig Start Date End Date Taking? Authorizing Provider  acetaminophen (TYLENOL) 500 MG tablet Take 1,000 mg by mouth See admin instructions. Take 2 tablets (1000 mg) by mouth during dialysis on Monday,  Wednesday, Friday, take 2 tablets (1000 mg) daily at bedtime    [provider]  amLODipine (NORVASC) 10 MG tablet TAKE 1 TABLET BY MOUTH EVERY DAY Patient taking differently: Take 10 mg by mouth at bedtime.  07/01/19   [name], MD  aspirin EC 81 MG tablet Take 81 mg by mouth See admin instructions. Take one tablet (81 mg) by mouth every other night    [provider]  b complex-vitamin c-folic acid (NEPHRO-VITE) 0.8 MG TABS tablet Take 1 tablet by mouth at bedtime.  03/12/18   [provider]  carvedilol (COREG) 25 MG tablet TAKE 1 TABLET TWICE A DAY WITH FOOD Patient taking differently: Take 25 mg by mouth in the morning and at bedtime.  09/16/19   [name], MD  diphenhydrAMINE (BENADRYL) 25 MG tablet Take 25 mg by mouth See admin instructions. Take 2 tablets (50 mg) by mouth during dialysis on Monday,  Wednesday, Friday, take 1-2 tablets (25-50 mg) daily at bedtime    [provider]  ferric citrate (AURYXIA) 1 GM 210 MG(Fe) tablet Take 210-420 mg by mouth See admin instructions. Take 2 tablets (420 mg) by mouth three times daily with meals, take 1 tablet (210 mg) with snacks    [provider]  furosemide (LASIX) 80 MG tablet TAKE 1 TABLET (80 MG TOTAL) BY MOUTH 2 (TWO) TIMES DAILY. Patient taking differently: Take 80-160 mg by mouth See admin instructions. Take 2 tablets (160 mg) by mouth twice daily, may take an additional 1 or 2 tablets (80-160 mg) as needed for bloating 09/23/19   [name], MD  gabapentin (NEURONTIN) 100 MG capsule Take 2 capsules (200 mg total) by mouth 2 (two) times daily as needed (Give one hour prior to hemodialysis session as needed for anxiety/restlessness). Patient taking differently: Take 100-200 mg by mouth See admin instructions. On Monday, Wednesday, Friday (dialysis days) - take 2 capsules (200 mg) by mouth before dialysis and take 1 capsule (100 mg) at bedtime; on Sunday, Tuesday,  Thursday, Saturday (non-dialysis days) - take 1 capsule (100 mg) twice daily 03/26/19 02/03/20  [name], MD  hydrALAZINE (APRESOLINE) 25 MG tablet TAKE 1 TABLET (25 MG TOTAL) 3 (THREE) TIMES DAILY BY MOUTH. Patient taking differently: Take 25 mg by mouth in the morning and at bedtime.  02/13/18   [name], MD  hydrOXYzine (ATARAX/VISTARIL) 25 MG tablet Take 25 mg by mouth 2 (two) times daily.  08/06/19   [provider]  isosorbide mononitrate (IMDUR) 30 MG 24 hr tablet Take 1 tablet (30 mg total) by mouth daily. Patient taking differently: Take 30 mg by mouth at bedtime.  07/09/19   [name], MD  lidocaine-prilocaine (EMLA) cream APPLY THREE TIMES A WEEK AS DIRECTED 1 HOUR PRIOR TO DIALYSIS AND WRAP WITH PLASTIC WRAP Patient taking differently: Apply 1 application topically See admin instructions. Apply topically one hour before dialysis on Monday, Wednesday, Friday (cover with plastic wrap) 09/02/19   [name], MD  Methoxy PEG-Epoetin Beta (MIRCERA IJ) Mircera 11/13/19 11/11/20  [provider]  omeprazole (PRILOSEC) 40 MG capsule TAKE 1 CAPSULE BY MOUTH EVERY DAY Patient taking differently: Take 40 mg by mouth daily.  09/16/19   [name], MD  oxyCODONE-acetaminophen (PERCOCET) 10-325 MG tablet Take 1 tablet by mouth every 8 (eight) hours as needed for pain. 12/10/19   [name], MD  polyvinyl alcohol (ARTIFICIAL TEARS) 1.4 % ophthalmic solution Place 1 drop into both eyes at bedtime as needed for dry eyes (itching).    [provider]  rOPINIRole (REQUIP) 0.25 MG tablet Take 2 tablets (0.5 mg total) by mouth See admin instructions. Take 2 tablets (0.5 mg) by mouth before dialysis on Monday, Wednesday, Friday, take 2 tablets (0.5 mg) daily at bedtime 11/28/19   [name], MD  tamsulosin (FLOMAX) 0.4 MG CAPS capsule TAKE 2 CAPSULES BY MOUTH DAILY AFTER SUPPER Patient taking differently: Take 0.8  mg by mouth at bedtime.  09/16/19   [name], MD  traZODone (DESYREL) 50 MG tablet TAKE 1 TABLET AT BEDTIME 11/25/19   [name], MD  TRULICITY 0.53 ZJ/6.7HA SOPN INJECT 0.75MG  EVERY 7 DAYS Patient taking differently: Inject 0.75 mg into the skin every Saturday.  01/17/19   [name], MD  Vitamin D, Ergocalciferol, (DRISDOL) 1.25 MG (50000 UNIT) CAPS capsule Take 50,000 Units by mouth every 7 (seven) days.  [provider]    Allergies    Prednisone, Tape, Simvastatin, Lorazepam, Methocarbamol, and Seroquel [quetiapine fumarate]  Review of Systems   Review of Systems  HENT: Positive for sore throat.   All other systems reviewed and are negative.   Physical Exam Updated Vital Signs BP 123/73 (BP Location: Right Arm)   Pulse 71   Temp 98 F (36.7 C) (Oral)   Resp 18   Ht 6' (1.829 m)   Wt 89.8 kg   SpO2 100%   BMI 26.85 kg/m   Physical Exam Vitals and nursing note reviewed.  HENT:     Head: Normocephalic.     Nose: Nose normal.     Mouth/Throat:     Mouth: Mucous membranes are moist.  Eyes:     Extraocular Movements: Extraocular movements intact.     Pupils: Pupils are equal, round, and reactive to light.  Cardiovascular:     Rate and Rhythm: Normal rate and regular rhythm.     Pulses: Normal pulses.     Heart sounds: Normal heart sounds.  Pulmonary:     Effort: Pulmonary effort is normal.  Abdominal:     General: Abdomen is flat.     Palpations: Abdomen is soft.  Musculoskeletal:        General: Normal range of motion.     Cervical back: Normal range of motion.  Skin:    General: Skin is warm.  Neurological:     General: No focal deficit present.     Mental Status: He is alert and oriented to person, place, and time.  Psychiatric:        Mood and Affect: Mood normal.        Behavior: Behavior normal.     ED Results / Procedures / Treatments   Labs (all labs ordered are listed, but only abnormal results are  displayed) Labs Reviewed  CBC WITH DIFFERENTIAL/PLATELET - Abnormal; Notable for the following components:      Result Value   RBC 3.62 (*)    Hemoglobin 10.6 (*)    HCT 34.3 (*)    All other components within normal limits  BASIC METABOLIC PANEL - Abnormal; Notable for the following components:   Potassium 5.2 (*)    Chloride 96 (*)    Glucose, Bld 156 (*)    BUN 41 (*)    Creatinine, Ser 7.16 (*)    Calcium 8.5 (*)    GFR calc non Af Amer 8 (*)    GFR calc Af Amer 9 (*)    All other components within normal limits    EKG None  Radiology DG Chest Port 1 View  Result Date: 12/14/2019 CLINICAL DATA:  54 year old male with positive COVID-19 and cough. EXAM: PORTABLE CHEST 1 VIEW COMPARISON:  Chest radiograph dated 10/27/2019. FINDINGS: There is mild cardiomegaly with mild vascular congestion. No focal consolidation, pleural effusion, pneumothorax. No acute osseous pathology. IMPRESSION: Mild cardiomegaly with mild vascular congestion. No focal consolidation. Electronically Signed   By: Anner Crete M.D.   On: 12/14/2019 22:19    Procedures Procedures (including critical care time)  Medications Ordered in ED Medications  sodium zirconium cyclosilicate (LOKELMA) packet 5 g (has no administration in time range)    ED Course  I have reviewed the triage vital signs and the nursing notes.  Pertinent labs & imaging results that were available during my care of the patient were reviewed by me and considered in my medical decision making (see chart for details).  MDM Rules/Calculators/A&P                      [facility] is a 54 y.o. male here presenting with sore throat and positive Covid. Patient is dialysis patient and appears well overall, he does not have any oxygen requirement.  Vitals are stable.  Will get labs and chest x-ray.  11:52 PM Patient's labs showed potassium of 5.2. Given lokelma. CXR showed congestion, no infiltrate. No oxygen requirement. Talked to  Dr. Melvia Heaps from nephrology and he will coordinate to get patient to a Covid positive dialysis unit in 1 to 2 days.  Told him to quarantine at home.  Final Clinical Impression(s) / ED Diagnoses Final diagnoses:  COVID-19  ESRD (end stage renal disease) on dialysis [facility])  Hyperkalemia    Rx / DC Orders ED Discharge Orders    None       Drenda Freeze, MD 12/14/19 2353

## 2019-12-14 NOTE — ED Triage Notes (Signed)
Pt states he was notified today he is COVID +, he advised his HD center and was told to come to the ED d/t hx.  Pt denies new pain, Thursday has "scratchy" throat and some congestion but denies now. No fever.  Pt unsure what exact s/s he was sent to the to treat.

## 2019-12-14 NOTE — Discharge Instructions (Signed)
He has Covid so please stay home.  You will be contacted on Monday regarding with dialysis center to go to.  Return to ER if you have chest pain, trouble breathing, fevers.     Person Under Monitoring Name: [facility]  Location: [place] [address]   Infection Prevention Recommendations for Individuals Confirmed to have, or Being Evaluated for, 2019 Novel Coronavirus (COVID-19) Infection Who Receive Care at Home  Individuals who are confirmed to have, or are being evaluated for, COVID-19 should follow the prevention steps below until a healthcare provider or local or state health department says they can return to normal activities.  Stay home except to get medical care You should restrict activities outside your home, except for getting medical care. Do not go to work, school, or public areas, and do not use public transportation or taxis.  Call ahead before visiting your doctor Before your medical appointment, call the healthcare provider and tell them that you have, or are being evaluated for, COVID-19 infection. This will help the healthcare providers office take steps to keep other people from getting infected. Ask your healthcare provider to call the local or state health department.  Monitor your symptoms Seek prompt medical attention if your illness is worsening (e.g., difficulty breathing). Before going to your medical appointment, call the healthcare provider and tell them that you have, or are being evaluated for, COVID-19 infection. Ask your healthcare provider to call the local or state health department.  Wear a facemask You should wear a facemask that covers your nose and mouth when you are in the same room with other people and when you visit a healthcare provider. People who live with or visit you should also wear a facemask while they are in the same room with you.  Separate yourself from other people in your home As much as  possible, you should stay in a different room from other people in your home. Also, you should use a separate bathroom, if available.  Avoid sharing household items You should not share dishes, drinking glasses, cups, eating utensils, towels, bedding, or other items with other people in your home. After using these items, you should wash them thoroughly with soap and water.  Cover your coughs and sneezes Cover your mouth and nose with a tissue when you cough or sneeze, or you can cough or sneeze into your sleeve. Throw used tissues in a lined trash can, and immediately wash your hands with soap and water for at least 20 seconds or use an alcohol-based hand rub.  Wash your [facility] your hands often and thoroughly with soap and water for at least 20 seconds. You can use an alcohol-based hand sanitizer if soap and water are not available and if your hands are not visibly dirty. Avoid touching your eyes, nose, and mouth with unwashed hands.   Prevention Steps for Caregivers and Household Members of Individuals Confirmed to have, or Being Evaluated for, COVID-19 Infection Being Cared for in the Home  If you live with, or provide care at home for, a person confirmed to have, or being evaluated for, COVID-19 infection please follow these guidelines to prevent infection:  Follow healthcare providers instructions Make sure that you understand and can help the patient follow any healthcare provider instructions for all care.  Provide for the patients basic needs You should help the patient with basic needs in the home and provide support for getting groceries, prescriptions, and other personal needs.  Monitor the patients symptoms If they are getting sicker, call his or her medical provider and tell them that the patient has, or is being evaluated for, COVID-19 infection. This will help the healthcare providers office take steps to keep other people from getting infected. Ask the  healthcare provider to call the local or state health department.  Limit the number of people who have contact with the patient If possible, have only one caregiver for the patient. Other household members should stay in another home or place of residence. If this is not possible, they should stay in another room, or be separated from the patient as much as possible. Use a separate bathroom, if available. Restrict visitors who do not have an essential need to be in the home.  Keep older adults, very young children, and other sick people away from the patient Keep older adults, very young children, and those who have compromised immune systems or chronic health conditions away from the patient. This includes people with chronic heart, lung, or kidney conditions, diabetes, and cancer.  Ensure good ventilation Make sure that shared spaces in the home have good air flow, such as from an air conditioner or an opened window, weather permitting.  Wash your hands often Wash your hands often and thoroughly with soap and water for at least 20 seconds. You can use an alcohol based hand sanitizer if soap and water are not available and if your hands are not visibly dirty. Avoid touching your eyes, nose, and mouth with unwashed hands. Use disposable paper towels to dry your hands. If not available, use dedicated cloth towels and replace them when they become wet.  Wear a facemask and gloves Wear a disposable facemask at all times in the room and gloves when you touch or have contact with the patients blood, body fluids, and/or secretions or excretions, such as sweat, saliva, sputum, nasal mucus, vomit, urine, or feces.  Ensure the mask fits over your nose and mouth tightly, and do not touch it during use. Throw out disposable facemasks and gloves after using them. Do not reuse. Wash your hands immediately after removing your facemask and gloves. If your personal clothing becomes contaminated, carefully  remove clothing and launder. Wash your hands after handling contaminated clothing. Place all used disposable facemasks, gloves, and other waste in a lined container before disposing them with other household waste. Remove gloves and wash your hands immediately after handling these items.  Do not share dishes, glasses, or other household items with the patient Avoid sharing household items. You should not share dishes, drinking glasses, cups, eating utensils, towels, bedding, or other items with a patient who is confirmed to have, or being evaluated for, COVID-19 infection. After the person uses these items, you should wash them thoroughly with soap and water.  Wash laundry thoroughly Immediately remove and wash clothes or bedding that have blood, body fluids, and/or secretions or excretions, such as sweat, saliva, sputum, nasal mucus, vomit, urine, or feces, on them. Wear gloves when handling laundry from the patient. Read and follow directions on labels of laundry or clothing items and detergent. In general, wash and dry with the warmest temperatures recommended on the label.  Clean all areas the individual has used often Clean all touchable surfaces, such as counters, tabletops, doorknobs, bathroom fixtures, toilets, phones, keyboards, tablets, and bedside tables, every day. Also, clean any surfaces that may have blood, body fluids, and/or secretions or excretions on them. Wear gloves when cleaning surfaces the patient has  come in contact with. Use a diluted bleach solution (e.g., dilute bleach with 1 part bleach and 10 parts water) or a household disinfectant with a label that says EPA-registered for coronaviruses. To make a bleach solution at home, add 1 tablespoon of bleach to 1 quart (4 cups) of water. For a larger supply, add  cup of bleach to 1 gallon (16 cups) of water. Read labels of cleaning products and follow recommendations provided on product labels. Labels contain instructions for  safe and effective use of the cleaning product including precautions you should take when applying the product, such as wearing gloves or eye protection and making sure you have good ventilation during use of the product. Remove gloves and wash hands immediately after cleaning.  Monitor yourself for signs and symptoms of illness Caregivers and household members are considered close contacts, should monitor their health, and will be asked to limit movement outside of the home to the extent possible. Follow the monitoring steps for close contacts listed on the symptom monitoring form.   ? If you have additional questions, contact your local health department or call the epidemiologist on call at [phone] (available 24/7). ? This guidance is subject to change. For the most up-to-date guidance from [facility], please refer to their website: YouBlogs.pl

## 2019-12-20 ENCOUNTER — Other Ambulatory Visit: Payer: Self-pay | Admitting: Nephrology

## 2019-12-20 DIAGNOSIS — E8770 Fluid overload, unspecified: Secondary | ICD-10-CM

## 2019-12-22 ENCOUNTER — Other Ambulatory Visit: Payer: Self-pay | Admitting: Family Medicine

## 2019-12-23 ENCOUNTER — Ambulatory Visit (HOSPITAL_COMMUNITY)
Admission: RE | Admit: 2019-12-23 | Discharge: 2019-12-23 | Disposition: A | Discharge status: Home / Self Care | Payer: Medicare Other | Source: Ambulatory Visit | Attending: Nephrology | Admitting: Nephrology

## 2019-12-23 ENCOUNTER — Other Ambulatory Visit: Payer: Self-pay

## 2019-12-23 DIAGNOSIS — R188 Other ascites: Secondary | ICD-10-CM | POA: Diagnosis not present

## 2019-12-23 DIAGNOSIS — E8770 Fluid overload, unspecified: Secondary | ICD-10-CM

## 2019-12-23 HISTORY — PX: IR PARACENTESIS: IMG2679

## 2019-12-23 MED ORDER — LIDOCAINE HCL 1 % IJ SOLN
INTRAMUSCULAR | Status: DC | PRN
  Administered 2019-12-23: 10 mL

## 2019-12-23 MED ORDER — LIDOCAINE HCL 1 % IJ SOLN
INTRAMUSCULAR | Status: AC
  Filled 2019-12-23: qty 20

## 2019-12-23 NOTE — Procedures (Signed)
PROCEDURE SUMMARY:  Successful image-guided paracentesis from the right lower abdomen.  Yielded 5.7 liters of clear yellow fluid.  No immediate complications.  EBL: zero Patient tolerated well.   Specimen was not sent for labs.  Please see imaging section of Epic for full dictation.  Joaquim Nam PA-C 12/23/2019 3:43 PM

## 2020-01-02 ENCOUNTER — Other Ambulatory Visit (HOSPITAL_COMMUNITY): Payer: Self-pay | Admitting: Nephrology

## 2020-01-02 DIAGNOSIS — E8770 Fluid overload, unspecified: Secondary | ICD-10-CM

## 2020-01-03 ENCOUNTER — Other Ambulatory Visit: Payer: Self-pay

## 2020-01-03 ENCOUNTER — Ambulatory Visit (HOSPITAL_COMMUNITY): Payer: Medicare Other

## 2020-01-03 ENCOUNTER — Ambulatory Visit (HOSPITAL_COMMUNITY)
Admission: RE | Admit: 2020-01-03 | Discharge: 2020-01-03 | Disposition: A | Discharge status: Home / Self Care | Payer: Medicare Other | Source: Ambulatory Visit | Attending: Nephrology | Admitting: Nephrology

## 2020-01-03 DIAGNOSIS — R188 Other ascites: Secondary | ICD-10-CM | POA: Diagnosis not present

## 2020-01-03 DIAGNOSIS — E8770 Fluid overload, unspecified: Secondary | ICD-10-CM | POA: Insufficient documentation

## 2020-01-03 HISTORY — PX: IR PARACENTESIS: IMG2679

## 2020-01-03 MED ORDER — LIDOCAINE HCL 1 % IJ SOLN
INTRAMUSCULAR | Status: AC
  Filled 2020-01-03: qty 20

## 2020-01-03 MED ORDER — LIDOCAINE HCL (PF) 1 % IJ SOLN
INTRAMUSCULAR | Status: DC | PRN
  Administered 2020-01-03: 10 mL

## 2020-01-03 NOTE — Procedures (Signed)
PROCEDURE SUMMARY:  Successful US guided paracentesis from left lower abdomen.  Yielded 4.4 L of clear yellow fluid.  No immediate complications.  Pt tolerated well.    EBL < 60mL  Theresa Duty, NP 01/03/2020 11:24 AM

## 2020-01-07 ENCOUNTER — Encounter: Payer: Self-pay | Admitting: Family Medicine

## 2020-01-07 MED ORDER — OXYCODONE-ACETAMINOPHEN 10-325 MG PO TABS: 1.0000 | ORAL_TABLET | Freq: Three times a day (TID) | ORAL | 0 refills | Status: DC | PRN

## 2020-01-07 NOTE — Telephone Encounter (Signed)
Last written 12/10/19  Pended for [facility]

## 2020-01-14 ENCOUNTER — Other Ambulatory Visit: Payer: Self-pay | Admitting: Family Medicine

## 2020-01-17 ENCOUNTER — Telehealth: Payer: Self-pay | Admitting: Family Medicine

## 2020-01-17 NOTE — Telephone Encounter (Signed)
Due for colon cancer screening. Would he like to do Cologuards?

## 2020-01-20 NOTE — Telephone Encounter (Signed)
Sending to assistant

## 2020-01-22 NOTE — Telephone Encounter (Signed)
Will discuss w/pt at [place] tomorrow.

## 2020-01-23 ENCOUNTER — Ambulatory Visit: Payer: BC Managed Care – PPO | Admitting: Family Medicine

## 2020-01-28 ENCOUNTER — Other Ambulatory Visit: Payer: Self-pay

## 2020-01-28 ENCOUNTER — Ambulatory Visit (HOSPITAL_COMMUNITY)
Admission: RE | Admit: 2020-01-28 | Discharge: 2020-01-28 | Disposition: A | Discharge status: Home / Self Care | Payer: Medicare Other | Source: Ambulatory Visit | Attending: Nephrology | Admitting: Nephrology

## 2020-01-28 DIAGNOSIS — K7460 Unspecified cirrhosis of liver: Secondary | ICD-10-CM | POA: Diagnosis not present

## 2020-01-28 DIAGNOSIS — E8770 Fluid overload, unspecified: Secondary | ICD-10-CM | POA: Diagnosis not present

## 2020-01-28 HISTORY — PX: IR PARACENTESIS: IMG2679

## 2020-01-28 MED ORDER — LIDOCAINE HCL 1 % IJ SOLN
INTRAMUSCULAR | Status: AC
  Filled 2020-01-28: qty 20

## 2020-01-28 NOTE — Procedures (Signed)
Ultrasound-guided therapeutic paracentesis performed yielding 7 liters of straw colored fluid. No immediate complications. EBL is none.

## 2020-01-29 ENCOUNTER — Telehealth: Payer: Self-pay | Admitting: Family Medicine

## 2020-01-29 NOTE — Telephone Encounter (Signed)
Will discuss w/pt tomorrow at [place].

## 2020-01-29 NOTE — Telephone Encounter (Signed)
Since patient did not show up for appointment please call about colon cancer screening it looks like he was due for a repeat in 3 years back in 2009 because of some abnormal polyps did he ever have a repeat done?

## 2020-01-30 ENCOUNTER — Other Ambulatory Visit: Payer: Self-pay

## 2020-01-30 ENCOUNTER — Ambulatory Visit (INDEPENDENT_AMBULATORY_CARE_PROVIDER_SITE_OTHER): Payer: Medicare Other | Admitting: Family Medicine

## 2020-01-30 ENCOUNTER — Encounter: Payer: Self-pay | Admitting: Family Medicine

## 2020-01-30 VITALS — BP 135/66 | HR 81 | Ht 72.0 in | Wt 209.0 lb

## 2020-01-30 DIAGNOSIS — Z794 Long term (current) use of insulin: Secondary | ICD-10-CM

## 2020-01-30 DIAGNOSIS — Z992 Dependence on renal dialysis: Secondary | ICD-10-CM | POA: Diagnosis not present

## 2020-01-30 DIAGNOSIS — R188 Other ascites: Secondary | ICD-10-CM

## 2020-01-30 DIAGNOSIS — G8929 Other chronic pain: Secondary | ICD-10-CM | POA: Diagnosis not present

## 2020-01-30 DIAGNOSIS — E1122 Type 2 diabetes mellitus with diabetic chronic kidney disease: Secondary | ICD-10-CM | POA: Diagnosis not present

## 2020-01-30 DIAGNOSIS — N186 End stage renal disease: Secondary | ICD-10-CM | POA: Diagnosis not present

## 2020-01-30 DIAGNOSIS — Z1211 Encounter for screening for malignant neoplasm of colon: Secondary | ICD-10-CM

## 2020-01-30 LAB — POCT GLYCOSYLATED HEMOGLOBIN (HGB A1C): Hemoglobin A1C: 7.4 % — AB (ref 4.0–5.6)

## 2020-01-30 MED ORDER — OXYCODONE-ACETAMINOPHEN 10-325 MG PO TABS: 1.0000 | ORAL_TABLET | Freq: Three times a day (TID) | ORAL | 0 refills | Status: DC | PRN

## 2020-01-30 NOTE — Progress Notes (Signed)
Established Patient Office Visit  Subjective:  Patient ID: James Burke, male    DOB: 02/06/1966  Age: 54 y.o. MRN: [id number]  CC:  Chief Complaint  Patient presents with  . Diabetes    HPI James Burke presents for   Diabetes - no hypoglycemic events. No wounds or sores that are not healing well. No increased thirst or urination. Checking glucose at home. Taking medications as prescribed without any side effects.  Chronic pain management for his knees and Charcot foot. He has been wearing his boot regularly but says he feels like it makes him more easily fall. He is really wanting to get a more fitted custom boot but says it will be quite expensive as an insurance will cover it. He is doing well on his current pain regimen and feels like it is adequate is not having any significant current concerns or side effects.   Past Medical History:  Diagnosis Date  . Anemia   . Arthritis, septic, knee ([place])   . C. difficile colitis 04/18/2008  . Congestive heart failure ([place])   . Diabetes mellitus    Type II  . DVT (deep venous thrombosis) ([facility])    right leg   . Dyspnea    "when I have too much fluid.'  . ESRD (end stage renal disease) ([place])    Tues, Thurs  . GERD (gastroesophageal reflux disease)   . History of blood transfusion 2017  . Pneumonia     Past Surgical History:  Procedure Laterality Date  . APPENDECTOMY  1995  . BASCILIC VEIN TRANSPOSITION Left 01/03/2017   Procedure: FIRST STAGE BRACHIAL VEIN TRANSPOSITION;  Surgeon: Conrad Clarksburg, MD;  Location: [facility];  Service: Vascular;  Laterality: Left;  . BASCILIC VEIN TRANSPOSITION Left 04/24/2017   Procedure: LEFT SECOND STAGE BRACHIAL VEIN TRANSPOSITION;  Surgeon: Conrad Gooding, MD;  Location: [facility];  Service: Vascular;  Laterality: Left;  . EYE SURGERY    . INSERTION OF DIALYSIS CATHETER Right 01/03/2017   Procedure: INSERTION OF DIALYSIS CATHETER RIGHT INTERNAL JUGULAR;  Surgeon: Conrad Excello, MD;  Location: [facility];   Service: Vascular;  Laterality: Right;  . IR PARACENTESIS  12/14/2017  . IR PARACENTESIS  02/01/2018  . IR PARACENTESIS  03/28/2018  . IR PARACENTESIS  05/17/2018  . IR PARACENTESIS  05/31/2018  . IR PARACENTESIS  06/12/2018  . IR PARACENTESIS  06/26/2018  . IR PARACENTESIS  07/10/2018  . IR PARACENTESIS  07/30/2018  . IR PARACENTESIS  08/30/2018  . IR PARACENTESIS  09/13/2018  . IR PARACENTESIS  09/27/2018  . IR PARACENTESIS  10/11/2018  . IR PARACENTESIS  10/30/2018  . IR PARACENTESIS  11/29/2018  . IR PARACENTESIS  01/22/2019  . IR PARACENTESIS  02/21/2019  . IR PARACENTESIS  03/21/2019  . IR PARACENTESIS  04/02/2019  . IR PARACENTESIS  04/18/2019  . IR PARACENTESIS  05/02/2019  . IR PARACENTESIS  05/16/2019  . IR PARACENTESIS  05/30/2019  . IR PARACENTESIS  06/14/2019  . IR PARACENTESIS  06/27/2019  . IR PARACENTESIS  07/11/2019  . IR PARACENTESIS  07/25/2019  . IR PARACENTESIS  08/06/2019  . IR PARACENTESIS  08/15/2019  . IR PARACENTESIS  08/29/2019  . IR PARACENTESIS  09/19/2019  . IR PARACENTESIS  10/04/2019  . IR PARACENTESIS  10/28/2019  . IR PARACENTESIS  11/14/2019  . IR PARACENTESIS  12/03/2019  . IR PARACENTESIS  12/23/2019  . IR PARACENTESIS  01/03/2020  . IR PARACENTESIS  01/28/2020  .  KNEE SURGERY Left 04/2014  . [place]  Bilateral   . PERICARDIOCENTESIS N/A 01/03/2017   Procedure: Pericardiocentesis;  Surgeon: Burnell Blanks, MD;  Location: [facility];  Service: Cardiovascular;  Laterality: N/A;    Family History  Problem Relation Age of Onset  . Heart disease Other        No family history  . Cancer Brother     Social History   Socioeconomic History  . Marital status: Married    Spouse name: Not on file  . Number of children: 3  . Years of education: Not on file  . Highest education level: Not on file  Occupational History  . Not on file  Tobacco Use  . Smoking status: Never Smoker  . Smokeless tobacco: Former Systems developer    Types: Chew  . Tobacco comment: no  chewing 01/2017  Substance and Sexual Activity  . Alcohol use: No    Alcohol/week: 0.0 standard drinks  . Drug use: No  . Sexual activity: Yes  Other Topics Concern  . Not on file  Social History Narrative  . Not on file   Social Determinants of Health   Financial Resource Strain:   . Difficulty of Paying Living Expenses:   Food Insecurity:   . Worried About Charity fundraiser in the Last Year:   . Arboriculturist in the Last Year:   Transportation Needs:   . Film/video editor (Medical):   Marland Kitchen Lack of Transportation (Non-Medical):   Physical Activity:   . Days of Exercise per Week:   . Minutes of Exercise per Session:   Stress:   . Feeling of Stress :   Social Connections:   . Frequency of Communication with Friends and Family:   . Frequency of Social Gatherings with Friends and Family:   . Attends Religious Services:   . Active Member of Clubs or Organizations:   . Attends Archivist Meetings:   Marland Kitchen Marital Status:   Intimate Partner Violence:   . Fear of Current or Ex-Partner:   . Emotionally Abused:   Marland Kitchen Physically Abused:   . Sexually Abused:     Outpatient Medications Prior to Visit  Medication Sig Dispense Refill  . acetaminophen (TYLENOL) 500 MG tablet Take 1,000 mg by mouth See admin instructions. Take 2 tablets (1000 mg) by mouth during dialysis on Monday, Wednesday, Friday, take 2 tablets (1000 mg) daily at bedtime    . amLODipine (NORVASC) 10 MG tablet TAKE 1 TABLET BY MOUTH EVERY DAY (Patient taking differently: Take 10 mg by mouth at bedtime. ) 90 tablet 3  . aspirin EC 81 MG tablet Take 81 mg by mouth See admin instructions. Take one tablet (81 mg) by mouth every other night    . b complex-vitamin c-folic acid (NEPHRO-VITE) 0.8 MG TABS tablet Take 1 tablet by mouth at bedtime.   6  . carvedilol (COREG) 25 MG tablet TAKE 1 TABLET TWICE A DAY WITH FOOD (Patient taking differently: Take 25 mg by mouth in the morning and at bedtime. ) 180 tablet 1  .  diphenhydrAMINE (BENADRYL) 25 MG tablet Take 25 mg by mouth See admin instructions. Take 2 tablets (50 mg) by mouth during dialysis on Monday, Wednesday, Friday, take 1-2 tablets (25-50 mg) daily at bedtime    . ferric citrate (AURYXIA) 1 GM 210 MG(Fe) tablet Take 210-420 mg by mouth See admin instructions. Take 2 tablets (420 mg) by mouth three times daily with meals, take  1 tablet (210 mg) with snacks    . furosemide (LASIX) 80 MG tablet TAKE 1 TABLET (80 MG TOTAL) BY MOUTH 2 (TWO) TIMES DAILY. (Patient taking differently: Take 80-160 mg by mouth See admin instructions. Take 2 tablets (160 mg) by mouth twice daily, may take an additional 1 or 2 tablets (80-160 mg) as needed for bloating) 180 tablet 3  . gabapentin (NEURONTIN) 100 MG capsule Take 2 capsules (200 mg total) by mouth 2 (two) times daily as needed (Give one hour prior to hemodialysis session as needed for anxiety/restlessness). (Patient taking differently: Take 100-200 mg by mouth See admin instructions. On Monday, Wednesday, Friday (dialysis days) - take 2 capsules (200 mg) by mouth before dialysis and take 1 capsule (100 mg) at bedtime; on Sunday, Tuesday, Thursday, Saturday (non-dialysis days) - take 1 capsule (100 mg) twice daily) 220 capsule 0  . hydrALAZINE (APRESOLINE) 25 MG tablet TAKE 1 TABLET (25 MG TOTAL) 3 (THREE) TIMES DAILY BY MOUTH. (Patient taking differently: Take 25 mg by mouth in the morning and at bedtime. ) 90 tablet 1  . hydrOXYzine (ATARAX/VISTARIL) 25 MG tablet Take 25 mg by mouth 2 (two) times daily.     . isosorbide mononitrate (IMDUR) 30 MG 24 hr tablet Take 1 tablet (30 mg total) by mouth daily. (Patient taking differently: Take 30 mg by mouth at bedtime. ) 90 tablet 3  . lidocaine-prilocaine (EMLA) cream APPLY THREE TIMES A WEEK AS DIRECTED 1 HOUR PRIOR TO DIALYSIS AND WRAP WITH PLASTIC WRAP (Patient taking differently: Apply 1 application topically See admin instructions. Apply topically one hour before dialysis on  Monday, Wednesday, Friday (cover with plastic wrap)) 30 g 3  . Methoxy PEG-Epoetin Beta (MIRCERA IJ) Mircera    . omeprazole (PRILOSEC) 40 MG capsule TAKE 1 CAPSULE BY MOUTH EVERY DAY (Patient taking differently: Take 40 mg by mouth daily. ) 90 capsule 1  . polyvinyl alcohol (ARTIFICIAL TEARS) 1.4 % ophthalmic solution Place 1 drop into both eyes at bedtime as needed for dry eyes (itching).    Marland Kitchen rOPINIRole (REQUIP) 0.25 MG tablet TAKE 2 TABLETS BEFORE DIALYSIS ON MON, WED, FRI, & 2 TABS DAILY AT BEDTIME 180 tablet 1  . tamsulosin (FLOMAX) 0.4 MG CAPS capsule TAKE 2 CAPSULES BY MOUTH DAILY AFTER SUPPER (Patient taking differently: Take 0.8 mg by mouth at bedtime. ) 180 capsule 2  . traZODone (DESYREL) 50 MG tablet TAKE 1 TABLET AT BEDTIME 90 tablet 0  . TRULICITY 6.16 WV/3.7TG SOPN INJECT 0.75MG EVERY 7 DAYS (Patient taking differently: Inject 0.75 mg into the skin every Saturday. ) 6 pen 6  . Vitamin D, Ergocalciferol, (DRISDOL) 1.25 MG (50000 UNIT) CAPS capsule Take 50,000 Units by mouth every 7 (seven) days.    Marland Kitchen oxyCODONE-acetaminophen (PERCOCET) 10-325 MG tablet Take 1 tablet by mouth every 8 (eight) hours as needed for pain. 90 tablet 0   No facility-administered medications prior to visit.    Allergies  Allergen Reactions  . Prednisone Other (See Comments)    BLINDNESS > [ ? CSCR ? ]  . Tape Other (See Comments)    Paper tape causes blisters   . Simvastatin Other (See Comments)    Myalgia, joint pain  . Lorazepam Anxiety and Other (See Comments)    Nervousness and "fidgets"; legs "need to kick, "electrical currents"  . Methocarbamol Diarrhea  . Seroquel [Quetiapine Fumarate] Other (See Comments)    QT prolongation    ROS Review of Systems    Objective:  Physical Exam  Constitutional: He is oriented to person, place, and time. He appears well-developed and well-nourished.  HENT:  Head: Normocephalic and atraumatic.  Cardiovascular: Normal rate, regular rhythm and normal  heart sounds.  Pulmonary/Chest: Effort normal and breath sounds normal.  Neurological: He is alert and oriented to person, place, and time.  Skin: Skin is warm and dry.  Psychiatric: He has a normal mood and affect. His behavior is normal.    BP 135/66   Pulse 81   Ht 6' (1.829 m)   Wt 209 lb (94.8 kg)   SpO2 98%   BMI 28.35 kg/m  Wt Readings from Last 3 Encounters:  01/30/20 209 lb (94.8 kg)  12/14/19 198 lb (89.8 kg)  11/14/19 201 lb (91.2 kg)     Health Maintenance Due  Topic Date Due  . COLONOSCOPY  05/16/2011  . OPHTHALMOLOGY EXAM  10/06/2018    There are no preventive care reminders to display for this patient.  Lab Results  Component Value Date   TSH 4.260 10/27/2019   Lab Results  Component Value Date   WBC 5.0 12/14/2019   HGB 10.6 (L) 12/14/2019   HCT 34.3 (L) 12/14/2019   MCV 94.8 12/14/2019   PLT 209 12/14/2019   Lab Results  Component Value Date   NA 138 12/14/2019   K 5.2 (H) 12/14/2019   CO2 27 12/14/2019   GLUCOSE 156 (H) 12/14/2019   BUN 41 (H) 12/14/2019   CREATININE 7.16 (H) 12/14/2019   BILITOT 0.8 10/27/2019   ALKPHOS 69 10/27/2019   AST 18 10/27/2019   ALT 17 10/27/2019   PROT 7.6 10/27/2019   ALBUMIN 3.6 10/27/2019   CALCIUM 8.5 (L) 12/14/2019   ANIONGAP 15 12/14/2019   EGFR 26.0 04/16/2014   Lab Results  Component Value Date   CHOL 115 04/06/2018   Lab Results  Component Value Date   HDL 31 (L) 04/06/2018   Lab Results  Component Value Date   LDLCALC 62 04/06/2018   Lab Results  Component Value Date   TRIG 141 04/06/2018   Lab Results  Component Value Date   CHOLHDL 3.7 04/06/2018   Lab Results  Component Value Date   HGBA1C 7.4 (A) 01/30/2020      Assessment & Plan:   Problem List Items Addressed This Visit      Endocrine   Type 2 diabetes mellitus with diabetic chronic kidney disease ([place]) - Primary (Chronic)    Has been inconsistant with his Trulicity.  Says he will work on it.  If A1C not at goal at  f/u in 3 months then will need to adjust his reigimen.        Relevant Orders   POCT glycosylated hemoglobin (Hb A1C) (Completed)   Controlled type 2 diabetes mellitus with renal manifestation ([facility])    ON Dialysis        Other   Encounter for chronic pain management    Refill oxycodone today. PDMP reviewed.   Indication for chronic opioid: Chronic back pain and Charcot foot. Medication and dose: Percocet 10/325 # pills per month: 90 Last UDS date: 10/22/2019       Opioid Treatment Agreement signed (Y/N): Y Opioid Treatment Agreement last reviewed with patient:   NCCSRS reviewed this encounter (include red flags):  Yes      Ascites (Chronic)    Has standing order for paracentesis.        Other Visit Diagnoses    Colon cancer screening  Relevant Orders   Cologuard      Colon Can Screening - really doesn't want to go for scope. Had 1 scope and had severe bleeding afterwards that required hospitalization so he hasn't wanted to do a scope since then. I would prefer scope based on previous findings but he is willing to do Cologuard.     Meds ordered this encounter  Medications  . oxyCODONE-acetaminophen (PERCOCET) 10-325 MG tablet    Sig: Take 1 tablet by mouth every 8 (eight) hours as needed for pain.    Dispense:  90 tablet    Refill:  0    Follow-up: Return in about 3 months (around 05/01/2020) for Diabetes follow-up and pain managment.  [name], MD

## 2020-01-30 NOTE — Assessment & Plan Note (Addendum)
Has been inconsistant with his Trulicity.  Says he will work on it.  If A1C not at goal at f/u in 3 months then will need to adjust his reigimen.

## 2020-01-30 NOTE — Assessment & Plan Note (Signed)
Has standing order for paracentesis.

## 2020-01-30 NOTE — Assessment & Plan Note (Signed)
Refill oxycodone today. PDMP reviewed.   Indication for chronic opioid: Chronic back pain and Charcot foot. Medication and dose: Percocet 10/325 # pills per month: 90 Last UDS date: 10/22/2019       Opioid Treatment Agreement signed (Y/N): Y Opioid Treatment Agreement last reviewed with patient:   NCCSRS reviewed this encounter (include red flags):  Yes

## 2020-01-30 NOTE — Assessment & Plan Note (Signed)
ON Dialysis

## 2020-02-10 ENCOUNTER — Other Ambulatory Visit: Payer: Self-pay | Admitting: Family Medicine

## 2020-02-12 ENCOUNTER — Ambulatory Visit (HOSPITAL_COMMUNITY)
Admission: RE | Admit: 2020-02-12 | Discharge: 2020-02-12 | Disposition: A | Discharge status: Home / Self Care | Payer: Medicare Other | Source: Ambulatory Visit | Attending: Nephrology | Admitting: Nephrology

## 2020-02-12 ENCOUNTER — Other Ambulatory Visit: Payer: Self-pay | Admitting: Nephrology

## 2020-02-12 ENCOUNTER — Other Ambulatory Visit (HOSPITAL_COMMUNITY): Payer: Self-pay | Admitting: Nephrology

## 2020-02-12 ENCOUNTER — Other Ambulatory Visit: Payer: Self-pay

## 2020-02-12 ENCOUNTER — Telehealth: Payer: Self-pay | Admitting: *Deleted

## 2020-02-12 DIAGNOSIS — E8770 Fluid overload, unspecified: Secondary | ICD-10-CM

## 2020-02-12 DIAGNOSIS — R188 Other ascites: Secondary | ICD-10-CM | POA: Diagnosis not present

## 2020-02-12 HISTORY — PX: IR PARACENTESIS: IMG2679

## 2020-02-12 MED ORDER — LIDOCAINE HCL 1 % IJ SOLN
INTRAMUSCULAR | Status: AC
  Filled 2020-02-12: qty 20

## 2020-02-12 NOTE — Telephone Encounter (Signed)
lvm advising pt that the fmla forms were completed and were up front for pick up when he was here for his appointment. There was no fax number for the forms to be faxed to. Asked that he call or send a my chart message if there a number that this should be faxed to.

## 2020-02-12 NOTE — Procedures (Signed)
Ultrasound-guided therapeutic paracentesis performed yielding 6.4 liters of straw colored colored fluid. No immediate complications. EBL is < 2 ml.

## 2020-02-13 NOTE — Telephone Encounter (Signed)
Pt's wife replied

## 2020-02-16 ENCOUNTER — Other Ambulatory Visit: Payer: Self-pay | Admitting: Family Medicine

## 2020-02-24 ENCOUNTER — Other Ambulatory Visit: Payer: Self-pay | Admitting: Family Medicine

## 2020-02-24 ENCOUNTER — Encounter: Payer: Self-pay | Admitting: Family Medicine

## 2020-02-25 ENCOUNTER — Other Ambulatory Visit: Payer: Self-pay | Admitting: Nephrology

## 2020-02-25 DIAGNOSIS — E8770 Fluid overload, unspecified: Secondary | ICD-10-CM

## 2020-02-27 ENCOUNTER — Ambulatory Visit (HOSPITAL_COMMUNITY)
Admission: RE | Admit: 2020-02-27 | Discharge: 2020-02-27 | Disposition: A | Discharge status: Home / Self Care | Payer: Medicare Other | Source: Ambulatory Visit | Attending: Nephrology | Admitting: Nephrology

## 2020-02-27 DIAGNOSIS — E8770 Fluid overload, unspecified: Secondary | ICD-10-CM

## 2020-02-27 DIAGNOSIS — R188 Other ascites: Secondary | ICD-10-CM | POA: Insufficient documentation

## 2020-02-27 HISTORY — PX: IR PARACENTESIS: IMG2679

## 2020-02-27 MED ORDER — LIDOCAINE HCL (PF) 1 % IJ SOLN
INTRAMUSCULAR | Status: DC | PRN
  Administered 2020-02-27: 10 mL

## 2020-02-27 MED ORDER — LIDOCAINE HCL 1 % IJ SOLN
INTRAMUSCULAR | Status: AC
  Filled 2020-02-27: qty 20

## 2020-02-27 NOTE — Procedures (Signed)
PROCEDURE SUMMARY:  Successful US guided paracentesis from right lateral abdomen.  Yielded 6.7 liters of yellow fluid.  No immediate complications.  Pt tolerated well.   Specimen was not sent for labs.  EBL < 48mL  Docia Barrier PA-C 02/27/2020 2:35 PM

## 2020-03-16 IMAGING — CR CHEST - 2 VIEW
2 series · 2 of 2 positions shown · non-contrast
Comparison: July 25, 2017

CLINICAL DATA: Bradycardia

EXAM:
CHEST - 2 VIEW

[chest lat]
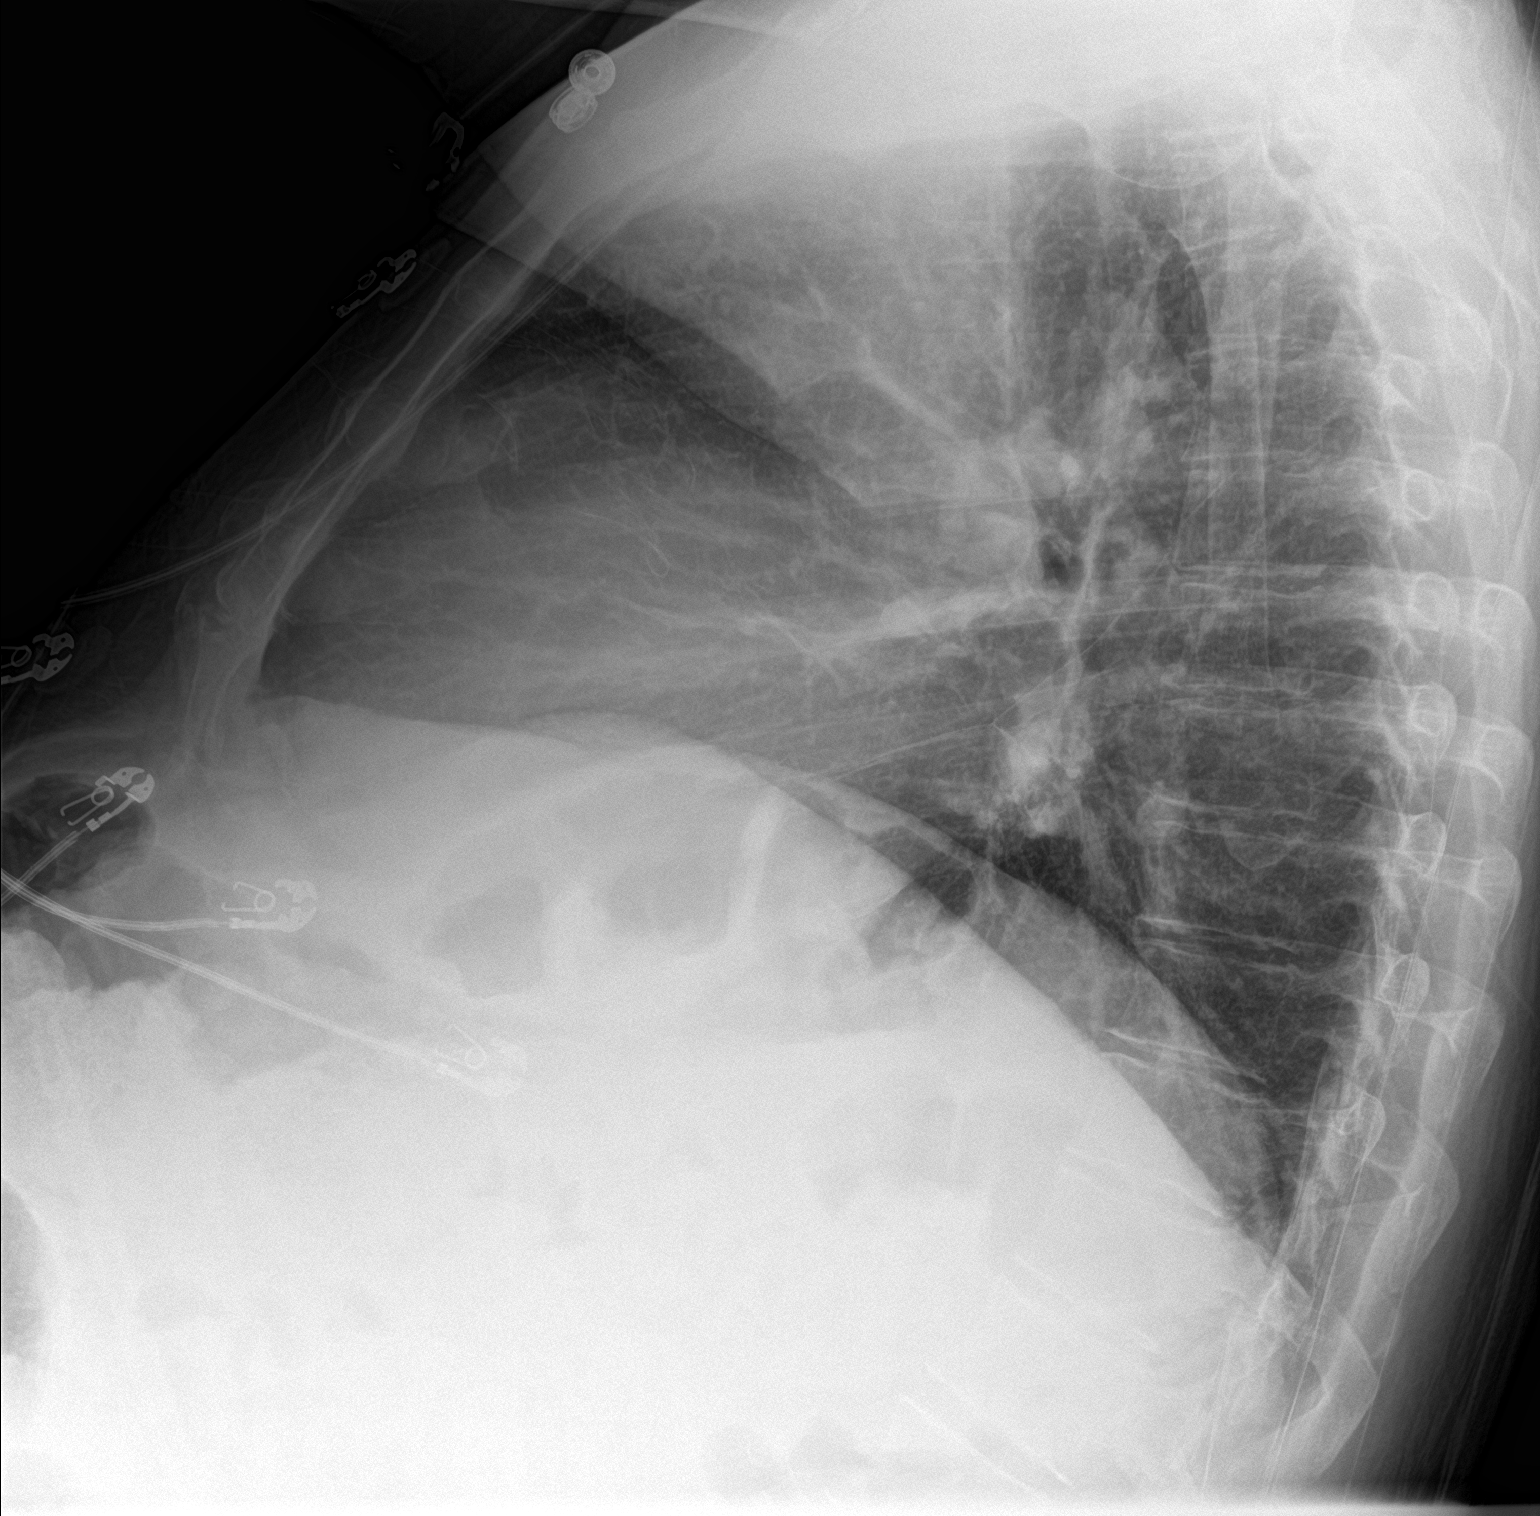

[chest ap]
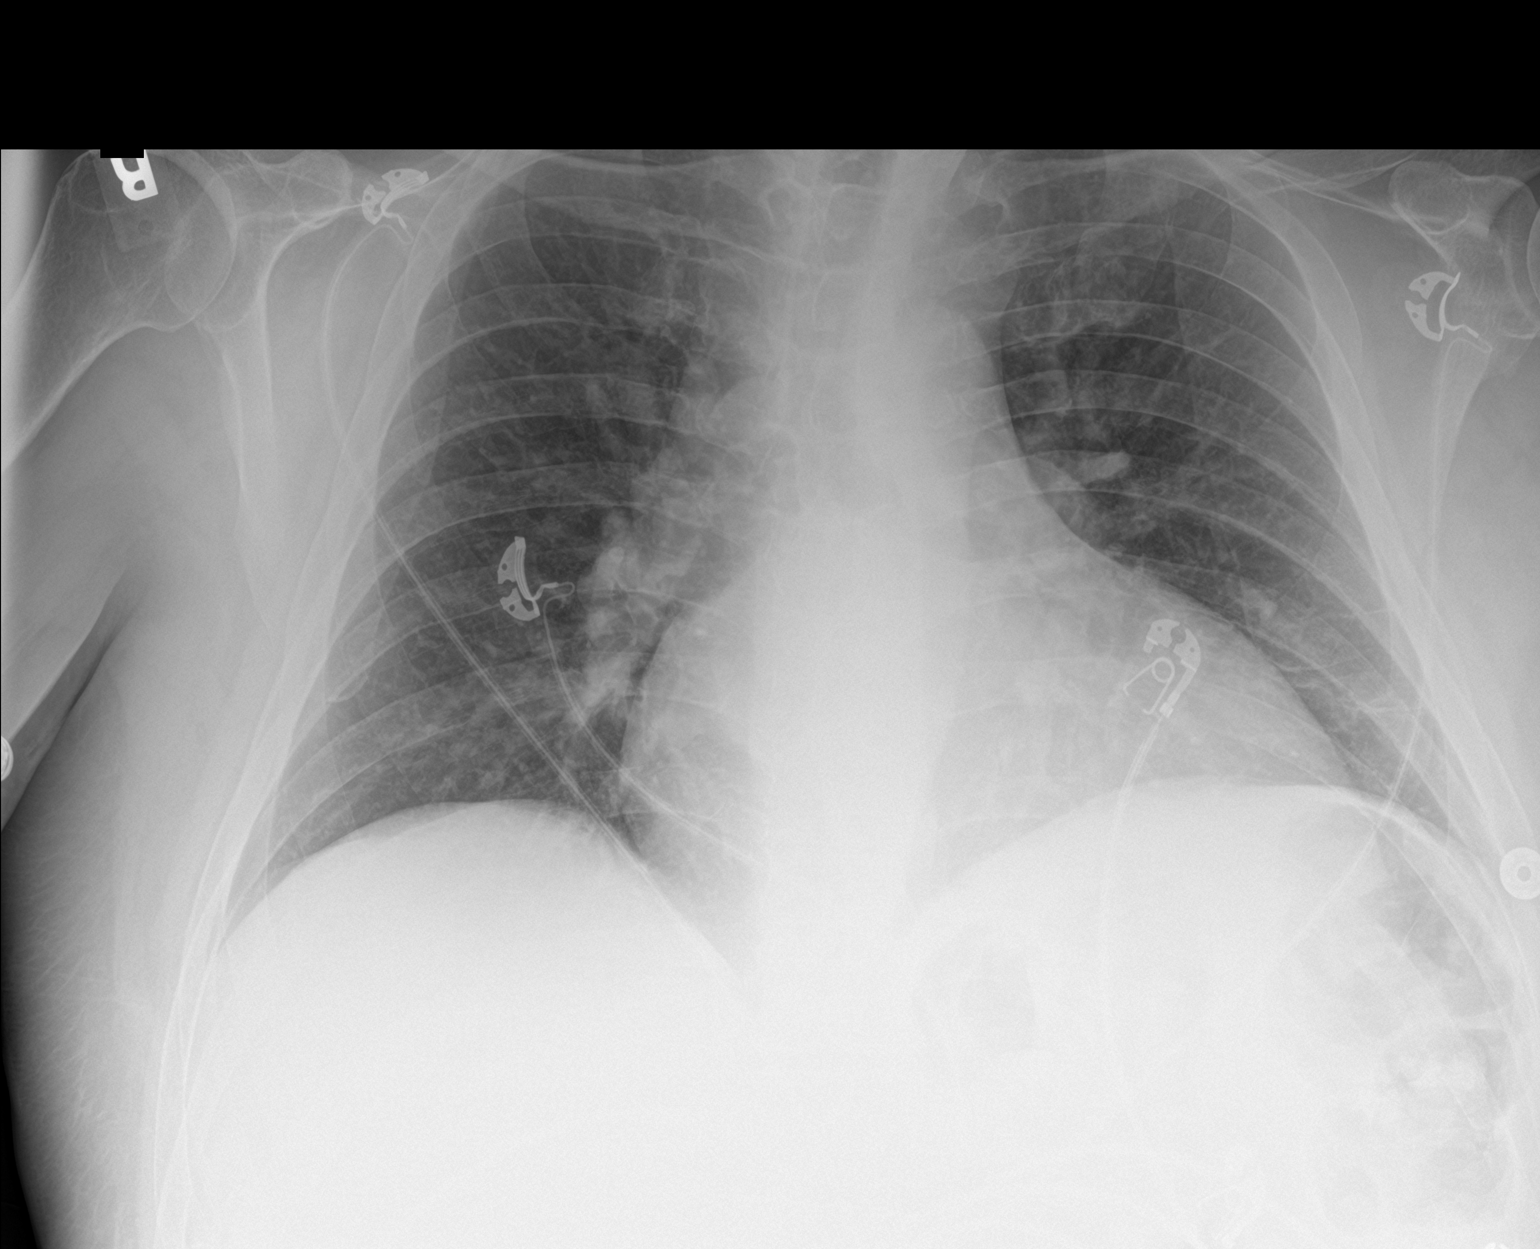

[2 of 2 positions shown; findings below may reference images not displayed]

FINDINGS: There is mild scarring in the left base. There is no edema or
consolidation. Heart is mildly enlarged with pulmonary vascularity
normal. No adenopathy. No pneumothorax. No bone lesions.
IMPRESSION: Stable scarring left base. No edema or consolidation. Stable cardiac
prominence.

## 2020-03-18 ENCOUNTER — Other Ambulatory Visit (HOSPITAL_COMMUNITY): Payer: Self-pay | Admitting: Nephrology

## 2020-03-18 ENCOUNTER — Other Ambulatory Visit: Payer: Self-pay | Admitting: Nephrology

## 2020-03-18 DIAGNOSIS — E8770 Fluid overload, unspecified: Secondary | ICD-10-CM

## 2020-03-23 ENCOUNTER — Other Ambulatory Visit: Payer: Self-pay | Admitting: Family Medicine

## 2020-03-24 ENCOUNTER — Ambulatory Visit (HOSPITAL_COMMUNITY)
Admission: RE | Admit: 2020-03-24 | Discharge: 2020-03-24 | Disposition: A | Discharge status: Home / Self Care | Payer: Medicare Other | Source: Ambulatory Visit | Attending: Nephrology | Admitting: Nephrology

## 2020-03-24 ENCOUNTER — Other Ambulatory Visit: Payer: Self-pay

## 2020-03-24 DIAGNOSIS — R188 Other ascites: Secondary | ICD-10-CM | POA: Diagnosis not present

## 2020-03-24 DIAGNOSIS — E8770 Fluid overload, unspecified: Secondary | ICD-10-CM

## 2020-03-24 HISTORY — PX: IR PARACENTESIS: IMG2679

## 2020-03-24 MED ORDER — LIDOCAINE HCL 1 % IJ SOLN
INTRAMUSCULAR | Status: AC
  Filled 2020-03-24: qty 20

## 2020-03-24 NOTE — Procedures (Signed)
Ultrasound-guided therapeutic paracentesis performed yielding 6 liters of straw colored fluid.  No immediate complications. EBL is none.  

## 2020-04-07 ENCOUNTER — Other Ambulatory Visit: Payer: Self-pay | Admitting: Nephrology

## 2020-04-07 ENCOUNTER — Other Ambulatory Visit: Payer: Self-pay | Admitting: Nurse Practitioner

## 2020-04-07 ENCOUNTER — Other Ambulatory Visit (HOSPITAL_COMMUNITY): Payer: Self-pay | Admitting: Nephrology

## 2020-04-07 DIAGNOSIS — E8770 Fluid overload, unspecified: Secondary | ICD-10-CM

## 2020-04-08 ENCOUNTER — Encounter: Payer: Self-pay | Admitting: Family Medicine

## 2020-04-09 ENCOUNTER — Ambulatory Visit (HOSPITAL_COMMUNITY)
Admission: RE | Admit: 2020-04-09 | Discharge: 2020-04-09 | Disposition: A | Discharge status: Home / Self Care | Payer: Medicare Other | Source: Ambulatory Visit | Attending: Nephrology | Admitting: Nephrology

## 2020-04-09 ENCOUNTER — Other Ambulatory Visit: Payer: Self-pay

## 2020-04-09 DIAGNOSIS — R188 Other ascites: Secondary | ICD-10-CM | POA: Insufficient documentation

## 2020-04-09 DIAGNOSIS — E8770 Fluid overload, unspecified: Secondary | ICD-10-CM

## 2020-04-09 HISTORY — PX: IR PARACENTESIS: IMG2679

## 2020-04-09 MED ORDER — LIDOCAINE HCL 1 % IJ SOLN
INTRAMUSCULAR | Status: AC | PRN
  Administered 2020-04-09: 10 mL

## 2020-04-09 MED ORDER — LIDOCAINE HCL 1 % IJ SOLN
INTRAMUSCULAR | Status: AC
  Filled 2020-04-09: qty 20

## 2020-04-09 MED ORDER — OXYCODONE-ACETAMINOPHEN 10-325 MG PO TABS: 1.0000 | ORAL_TABLET | Freq: Three times a day (TID) | ORAL | 0 refills | Status: DC | PRN

## 2020-04-09 NOTE — Procedures (Signed)
Ultrasound-guided  therapeutic paracentesis performed yielding 6 liters of yellow fluid. No immediate complications. EBL < 1 cc.

## 2020-04-09 NOTE — Telephone Encounter (Signed)
Rx pended.   Last OV - 01/30/20 Last written - 02/05/20

## 2020-04-15 ENCOUNTER — Other Ambulatory Visit: Payer: Self-pay | Admitting: Family Medicine

## 2020-04-22 ENCOUNTER — Other Ambulatory Visit: Payer: Self-pay | Admitting: Nephrology

## 2020-04-22 ENCOUNTER — Other Ambulatory Visit (HOSPITAL_COMMUNITY): Payer: Self-pay | Admitting: Nephrology

## 2020-04-22 DIAGNOSIS — E8770 Fluid overload, unspecified: Secondary | ICD-10-CM

## 2020-04-23 ENCOUNTER — Ambulatory Visit (HOSPITAL_COMMUNITY)
Admission: RE | Admit: 2020-04-23 | Discharge: 2020-04-23 | Disposition: A | Discharge status: Home / Self Care | Payer: Medicare Other | Source: Ambulatory Visit | Attending: Nephrology | Admitting: Nephrology

## 2020-04-23 ENCOUNTER — Other Ambulatory Visit: Payer: Self-pay

## 2020-04-23 DIAGNOSIS — R188 Other ascites: Secondary | ICD-10-CM | POA: Diagnosis not present

## 2020-04-23 DIAGNOSIS — E8770 Fluid overload, unspecified: Secondary | ICD-10-CM

## 2020-04-23 HISTORY — PX: IR PARACENTESIS: IMG2679

## 2020-04-23 MED ORDER — LIDOCAINE HCL 1 % IJ SOLN
INTRAMUSCULAR | Status: AC
  Filled 2020-04-23: qty 20

## 2020-04-23 MED ORDER — LIDOCAINE HCL 1 % IJ SOLN
INTRAMUSCULAR | Status: AC | PRN
  Administered 2020-04-23: 10 mL

## 2020-04-23 NOTE — Procedures (Signed)
PROCEDURE SUMMARY:  Successful US guided paracentesis from right lateral abdomen.   Yielded 6.2 L of clear yellow fluid.  No immediate complications.  Pt tolerated well.   EBL < 80mL  Theresa Duty, NP 04/23/2020 1:10 PM

## 2020-04-30 ENCOUNTER — Ambulatory Visit: Payer: Medicare Other | Admitting: Family Medicine

## 2020-05-05 ENCOUNTER — Other Ambulatory Visit (HOSPITAL_COMMUNITY): Payer: Self-pay | Admitting: Nephrology

## 2020-05-05 ENCOUNTER — Other Ambulatory Visit: Payer: Self-pay | Admitting: Nephrology

## 2020-05-05 DIAGNOSIS — R188 Other ascites: Secondary | ICD-10-CM

## 2020-05-07 ENCOUNTER — Encounter: Payer: Self-pay | Admitting: Family Medicine

## 2020-05-07 ENCOUNTER — Ambulatory Visit (HOSPITAL_COMMUNITY)
Admission: RE | Admit: 2020-05-07 | Discharge: 2020-05-07 | Disposition: A | Discharge status: Home / Self Care | Payer: Medicare Other | Source: Ambulatory Visit | Attending: Nephrology | Admitting: Nephrology

## 2020-05-07 ENCOUNTER — Other Ambulatory Visit: Payer: Self-pay

## 2020-05-07 DIAGNOSIS — R188 Other ascites: Secondary | ICD-10-CM | POA: Diagnosis not present

## 2020-05-07 HISTORY — PX: IR PARACENTESIS: IMG2679

## 2020-05-07 MED ORDER — LIDOCAINE HCL 1 % IJ SOLN
INTRAMUSCULAR | Status: AC | PRN
  Administered 2020-05-07: 10 mL

## 2020-05-07 MED ORDER — LIDOCAINE HCL 1 % IJ SOLN
INTRAMUSCULAR | Status: AC
  Filled 2020-05-07: qty 20

## 2020-05-07 NOTE — Procedures (Signed)
PROCEDURE SUMMARY:  Successful US guided paracentesis from right lateral abdomen.  Yielded 6.8 liters of yellow fluid.  No immediate complications.  Pt tolerated well.   Specimen was not sent for labs.  EBL < 45mL  Docia Barrier PA-C 05/07/2020 3:52 PM

## 2020-05-08 MED ORDER — OXYCODONE-ACETAMINOPHEN 10-325 MG PO TABS: 1.0000 | ORAL_TABLET | Freq: Three times a day (TID) | ORAL | 0 refills | Status: DC | PRN

## 2020-05-08 NOTE — Telephone Encounter (Signed)
Rx pended

## 2020-05-19 ENCOUNTER — Encounter: Payer: Self-pay | Admitting: Family Medicine

## 2020-05-19 ENCOUNTER — Ambulatory Visit (INDEPENDENT_AMBULATORY_CARE_PROVIDER_SITE_OTHER): Payer: Medicare Other | Admitting: Family Medicine

## 2020-05-19 VITALS — BP 99/54 | HR 72 | Ht 72.0 in | Wt 217.3 lb

## 2020-05-19 DIAGNOSIS — Z794 Long term (current) use of insulin: Secondary | ICD-10-CM | POA: Diagnosis not present

## 2020-05-19 DIAGNOSIS — E1122 Type 2 diabetes mellitus with diabetic chronic kidney disease: Secondary | ICD-10-CM | POA: Diagnosis not present

## 2020-05-19 DIAGNOSIS — G8929 Other chronic pain: Secondary | ICD-10-CM

## 2020-05-19 DIAGNOSIS — I5042 Chronic combined systolic (congestive) and diastolic (congestive) heart failure: Secondary | ICD-10-CM

## 2020-05-19 DIAGNOSIS — L603 Nail dystrophy: Secondary | ICD-10-CM

## 2020-05-19 DIAGNOSIS — I96 Gangrene, not elsewhere classified: Secondary | ICD-10-CM

## 2020-05-19 LAB — POCT GLYCOSYLATED HEMOGLOBIN (HGB A1C): Hemoglobin A1C: 8.2 % — AB (ref 4.0–5.6)

## 2020-05-19 MED ORDER — OXYCODONE-ACETAMINOPHEN 10-325 MG PO TABS: 1.0000 | ORAL_TABLET | Freq: Three times a day (TID) | ORAL | 0 refills | Status: DC | PRN

## 2020-05-19 MED ORDER — OXYCODONE-ACETAMINOPHEN 10-325 MG PO TABS
1.0000 | ORAL_TABLET | Freq: Three times a day (TID) | ORAL | 0 refills | Status: DC | PRN
Start: 2020-06-06 — End: 2020-05-19

## 2020-05-19 MED ORDER — FUROSEMIDE 80 MG PO TABS: 160.0000 mg | ORAL_TABLET | Freq: Two times a day (BID) | ORAL | 1 refills | Status: DC | PRN

## 2020-05-19 NOTE — Assessment & Plan Note (Addendum)
Will order UDS. He urinated this AM and only urinate infrequently bc of dialysis.   Refill oxycodone today.PDMP reviewed. Indication for chronic opioid:Chronic back pain and Charcot foot. Medication and dose:Percocet 10/325 # pills per month: 90 Last UDS date:10/22/2019 Opioid Treatment Agreement signed (Y/N):Y Opioid Treatment Agreement last reviewed with patient:  NCCSRS reviewed this encounter (include red flags):Yes

## 2020-05-19 NOTE — Progress Notes (Signed)
Established Patient Office Visit  Subjective:  Patient ID: James Burke, male    DOB: 1966/08/01  Age: 54 y.o. MRN: [id number]  CC:  Chief Complaint  Patient presents with  . Diabetes    HPI OLSON LUCARELLI presents for   Diabetes - no hypoglycemic events. No wounds or sores that are not healing well. No increased thirst or urination. Checking glucose at home. Taking medications as prescribed without any side effects.  He reports that he is not been consistent with his Trulicity and he also admits that his diet has not been great he is gained weight over this last year  Takes 2 tabs of Furosemide BID and occ takes extra if needed for swelling.  He needs his prescription updated as.  Follow-up chronic pain management-overall he is doing well on his current regimen.  He is actually hoping to get a new boot/shoe for his Charcot foot on the right.  He also has a new lesion on the tip of his second toe on his left foot that he would like me to look at and some nail concerns on his great toe on his right foot.  He says for years the nail has just been growing abnormally but he has not noticed any redness or drainage or infection etc.  He really did not want to remove his boot on the right foot today but did remove his sock and shoe for examination on the left.  Past Medical History:  Diagnosis Date  . Anemia   . Arthritis, septic, knee ([place])   . C. difficile colitis 04/18/2008  . Congestive heart failure ([place])   . Diabetes mellitus    Type II  . DVT (deep venous thrombosis) ([facility])    right leg   . Dyspnea    "when I have too much fluid.'  . ESRD (end stage renal disease) ([place])    Tues, Thurs  . GERD (gastroesophageal reflux disease)   . History of blood transfusion 2017  . Pneumonia     Past Surgical History:  Procedure Laterality Date  . APPENDECTOMY  1995  . BASCILIC VEIN TRANSPOSITION Left 01/03/2017   Procedure: FIRST STAGE BRACHIAL VEIN TRANSPOSITION;  Surgeon: Conrad Velva,  MD;  Location: [facility];  Service: Vascular;  Laterality: Left;  . BASCILIC VEIN TRANSPOSITION Left 04/24/2017   Procedure: LEFT SECOND STAGE BRACHIAL VEIN TRANSPOSITION;  Surgeon: Conrad Marietta-Alderwood, MD;  Location: [facility];  Service: Vascular;  Laterality: Left;  . EYE SURGERY    . INSERTION OF DIALYSIS CATHETER Right 01/03/2017   Procedure: INSERTION OF DIALYSIS CATHETER RIGHT INTERNAL JUGULAR;  Surgeon: Conrad Christine, MD;  Location: [facility];  Service: Vascular;  Laterality: Right;  . IR PARACENTESIS  12/14/2017  . IR PARACENTESIS  02/01/2018  . IR PARACENTESIS  03/28/2018  . IR PARACENTESIS  05/17/2018  . IR PARACENTESIS  05/31/2018  . IR PARACENTESIS  06/12/2018  . IR PARACENTESIS  06/26/2018  . IR PARACENTESIS  07/10/2018  . IR PARACENTESIS  07/30/2018  . IR PARACENTESIS  08/30/2018  . IR PARACENTESIS  09/13/2018  . IR PARACENTESIS  09/27/2018  . IR PARACENTESIS  10/11/2018  . IR PARACENTESIS  10/30/2018  . IR PARACENTESIS  11/29/2018  . IR PARACENTESIS  01/22/2019  . IR PARACENTESIS  02/21/2019  . IR PARACENTESIS  03/21/2019  . IR PARACENTESIS  04/02/2019  . IR PARACENTESIS  04/18/2019  . IR PARACENTESIS  05/02/2019  . IR PARACENTESIS  05/16/2019  .  IR PARACENTESIS  05/30/2019  . IR PARACENTESIS  06/14/2019  . IR PARACENTESIS  06/27/2019  . IR PARACENTESIS  07/11/2019  . IR PARACENTESIS  07/25/2019  . IR PARACENTESIS  08/06/2019  . IR PARACENTESIS  08/15/2019  . IR PARACENTESIS  08/29/2019  . IR PARACENTESIS  09/19/2019  . IR PARACENTESIS  10/04/2019  . IR PARACENTESIS  10/28/2019  . IR PARACENTESIS  11/14/2019  . IR PARACENTESIS  12/03/2019  . IR PARACENTESIS  12/23/2019  . IR PARACENTESIS  01/03/2020  . IR PARACENTESIS  01/28/2020  . IR PARACENTESIS  02/12/2020  . IR PARACENTESIS  02/27/2020  . IR PARACENTESIS  03/24/2020  . IR PARACENTESIS  04/09/2020  . IR PARACENTESIS  04/23/2020  . IR PARACENTESIS  05/07/2020  . KNEE SURGERY Left 04/2014  . [place]  Bilateral   . PERICARDIOCENTESIS N/A 01/03/2017   Procedure:  Pericardiocentesis;  Surgeon: Burnell Blanks, MD;  Location: [facility];  Service: Cardiovascular;  Laterality: N/A;    Family History  Problem Relation Age of Onset  . Heart disease Other        No family history  . Cancer Brother     Social History   Socioeconomic History  . Marital status: Married    Spouse name: Not on file  . Number of children: 3  . Years of education: Not on file  . Highest education level: Not on file  Occupational History  . Not on file  Tobacco Use  . Smoking status: Never Smoker  . Smokeless tobacco: Former Systems developer    Types: Chew  . Tobacco comment: no chewing 01/2017  Vaping Use  . Vaping Use: Never used  Substance and Sexual Activity  . Alcohol use: No    Alcohol/week: 0.0 standard drinks  . Drug use: No  . Sexual activity: Yes  Other Topics Concern  . Not on file  Social History Narrative  . Not on file   Social Determinants of Health   Financial Resource Strain:   . Difficulty of Paying Living Expenses: Not on file  Food Insecurity:   . Worried About Charity fundraiser in the Last Year: Not on file  . Ran Out of Food in the Last Year: Not on file  Transportation Needs:   . Lack of Transportation (Medical): Not on file  . Lack of Transportation (Non-Medical): Not on file  Physical Activity:   . Days of Exercise per Week: Not on file  . Minutes of Exercise per Session: Not on file  Stress:   . Feeling of Stress : Not on file  Social Connections:   . Frequency of Communication with Friends and Family: Not on file  . Frequency of Social Gatherings with Friends and Family: Not on file  . Attends Religious Services: Not on file  . Active Member of Clubs or Organizations: Not on file  . Attends Archivist Meetings: Not on file  . Marital Status: Not on file  Intimate Partner Violence:   . Fear of Current or Ex-Partner: Not on file  . Emotionally Abused: Not on file  . Physically Abused: Not on file  .  Sexually Abused: Not on file    Outpatient Medications Prior to Visit  Medication Sig Dispense Refill  . acetaminophen (TYLENOL) 500 MG tablet Take 1,000 mg by mouth See admin instructions. Take 2 tablets (1000 mg) by mouth during dialysis on Monday, Wednesday, Friday, take 2 tablets (1000 mg) daily at bedtime    .  amLODipine (NORVASC) 10 MG tablet TAKE 1 TABLET BY MOUTH EVERY DAY (Patient taking differently: Take 10 mg by mouth at bedtime. ) 90 tablet 3  . aspirin EC 81 MG tablet Take 81 mg by mouth See admin instructions. Take one tablet (81 mg) by mouth every other night    . b complex-vitamin c-folic acid (NEPHRO-VITE) 0.8 MG TABS tablet Take 1 tablet by mouth at bedtime.   6  . carvedilol (COREG) 25 MG tablet TAKE 1 TABLET TWICE A DAY WITH FOOD 180 tablet 1  . diphenhydrAMINE (BENADRYL) 25 MG tablet Take 25 mg by mouth See admin instructions. Take 2 tablets (50 mg) by mouth during dialysis on Monday, Wednesday, Friday, take 1-2 tablets (25-50 mg) daily at bedtime    . ferric citrate (AURYXIA) 1 GM 210 MG(Fe) tablet Take 210-420 mg by mouth See admin instructions. Take 2 tablets (420 mg) by mouth three times daily with meals, take 1 tablet (210 mg) with snacks    . gabapentin (NEURONTIN) 100 MG capsule Take 2 capsules (200 mg total) by mouth 2 (two) times daily as needed (Give one hour prior to hemodialysis session as needed for anxiety/restlessness). (Patient taking differently: Take 100-200 mg by mouth See admin instructions. On Monday, Wednesday, Friday (dialysis days) - take 2 capsules (200 mg) by mouth before dialysis and take 1 capsule (100 mg) at bedtime; on Sunday, Tuesday, Thursday, Saturday (non-dialysis days) - take 1 capsule (100 mg) twice daily) 220 capsule 0  . hydrALAZINE (APRESOLINE) 25 MG tablet TAKE 1 TABLET (25 MG TOTAL) 3 (THREE) TIMES DAILY BY MOUTH. (Patient taking differently: Take 25 mg by mouth in the morning and at bedtime. ) 90 tablet 1  . hydrOXYzine (ATARAX/VISTARIL) 25  MG tablet Take 25 mg by mouth 2 (two) times daily.     . isosorbide mononitrate (IMDUR) 30 MG 24 hr tablet Take 1 tablet (30 mg total) by mouth daily. (Patient taking differently: Take 30 mg by mouth at bedtime. ) 90 tablet 3  . lidocaine-prilocaine (EMLA) cream APPLY THREE TIMES A WEEK AS DIRECTED 1 HOUR PRIOR TO DIALYSIS AND WRAP WITH PLASTIC WRAP 30 g 3  . Methoxy PEG-Epoetin Beta (MIRCERA IJ) Mircera    . omeprazole (PRILOSEC) 40 MG capsule TAKE 1 CAPSULE BY MOUTH EVERY DAY 90 capsule 1  . polyvinyl alcohol (ARTIFICIAL TEARS) 1.4 % ophthalmic solution Place 1 drop into both eyes at bedtime as needed for dry eyes (itching).    Marland Kitchen rOPINIRole (REQUIP) 0.25 MG tablet TAKE 2 TABLETS BEFORE DIALYSIS ON MON, WED, FRI, & 2 TABS DAILY AT BEDTIME 180 tablet 1  . tamsulosin (FLOMAX) 0.4 MG CAPS capsule TAKE 2 CAPSULES BY MOUTH DAILY AFTER SUPPER (Patient taking differently: Take 0.8 mg by mouth at bedtime. ) 180 capsule 2  . traZODone (DESYREL) 50 MG tablet TAKE 1 TABLET AT BEDTIME 90 tablet 0  . TRULICITY 3.76 EG/3.1DV SOPN INJECT 0.75MG EVERY 7 DAYS 6 pen 6  . Vitamin D, Ergocalciferol, (DRISDOL) 1.25 MG (50000 UNIT) CAPS capsule Take 50,000 Units by mouth every 7 (seven) days.    . furosemide (LASIX) 80 MG tablet TAKE 1 TABLET (80 MG TOTAL) BY MOUTH 2 (TWO) TIMES DAILY. (Patient taking differently: Take 80-160 mg by mouth See admin instructions. Take 2 tablets (160 mg) by mouth twice daily, may take an additional 1 or 2 tablets (80-160 mg) as needed for bloating) 180 tablet 3  . oxyCODONE-acetaminophen (PERCOCET) 10-325 MG tablet Take 1 tablet by mouth every 8 (eight)  hours as needed for pain. 90 tablet 0   No facility-administered medications prior to visit.    Allergies  Allergen Reactions  . Prednisone Other (See Comments)    BLINDNESS > [ ? CSCR ? ]  . Tape Other (See Comments)    Paper tape causes blisters   . Simvastatin Other (See Comments)    Myalgia, joint pain  . Lorazepam Anxiety and  Other (See Comments)    Nervousness and "fidgets"; legs "need to kick, "electrical currents"  . Methocarbamol Diarrhea  . Seroquel [Quetiapine Fumarate] Other (See Comments)    QT prolongation    ROS Review of Systems    Objective:    Physical Exam Constitutional:      Appearance: He is well-developed.  HENT:     Head: Normocephalic and atraumatic.  Cardiovascular:     Rate and Rhythm: Normal rate and regular rhythm.     Heart sounds: Normal heart sounds.  Pulmonary:     Effort: Pulmonary effort is normal.     Breath sounds: Normal breath sounds.  Skin:    General: Skin is warm and dry.  Neurological:     Mental Status: He is alert and oriented to person, place, and time.  Psychiatric:        Behavior: Behavior normal.     BP (!) 99/54   Pulse 72   Ht 6' (1.829 m)   Wt 217 lb 4.8 oz (98.6 kg)   SpO2 96%   BMI 29.47 kg/m  Wt Readings from Last 3 Encounters:  05/19/20 217 lb 4.8 oz (98.6 kg)  01/30/20 209 lb (94.8 kg)  12/14/19 198 lb (89.8 kg)     Health Maintenance Due  Topic Date Due  . COLONOSCOPY  05/16/2011    There are no preventive care reminders to display for this patient.  Lab Results  Component Value Date   TSH 4.260 10/27/2019   Lab Results  Component Value Date   WBC 5.0 12/14/2019   HGB 10.6 (L) 12/14/2019   HCT 34.3 (L) 12/14/2019   MCV 94.8 12/14/2019   PLT 209 12/14/2019   Lab Results  Component Value Date   NA 138 12/14/2019   K 5.2 (H) 12/14/2019   CO2 27 12/14/2019   GLUCOSE 156 (H) 12/14/2019   BUN 41 (H) 12/14/2019   CREATININE 7.16 (H) 12/14/2019   BILITOT 0.8 10/27/2019   ALKPHOS 69 10/27/2019   AST 18 10/27/2019   ALT 17 10/27/2019   PROT 7.6 10/27/2019   ALBUMIN 3.6 10/27/2019   CALCIUM 8.5 (L) 12/14/2019   ANIONGAP 15 12/14/2019   EGFR 26.0 04/16/2014   Lab Results  Component Value Date   CHOL 115 04/06/2018   Lab Results  Component Value Date   HDL 31 (L) 04/06/2018   Lab Results  Component Value  Date   LDLCALC 62 04/06/2018   Lab Results  Component Value Date   TRIG 141 04/06/2018   Lab Results  Component Value Date   CHOLHDL 3.7 04/06/2018   Lab Results  Component Value Date   HGBA1C 8.2 (A) 05/19/2020      Assessment & Plan:   Problem List Items Addressed This Visit      Cardiovascular and Mediastinum   Chronic combined systolic and diastolic heart failure ([place])    Doing well on 160 mg of furosemide twice a day.  He does need his prescription updated so he is not running short.  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  A1c is significantly elevated today at 8.2 it was better back in May at 7.4.  Discussed getting back on track with the Trulicity.  We discussed Victoza is a daily option he does not want to do that and says he will try to be more consistent with the Trulicity dosing we also discussed that we can go up on the Trulicity if needed in addition to working on his diet and trying to lose some weight this will be helpful as well.  Follow-up in 90 days.      Relevant Orders   POCT glycosylated hemoglobin (Hb A1C) (Completed)     Other   Necrosis of toe ([place])    concerned that he has a scab with thickened skin on the tip of the left toe especially since he is on dialysis.  Not sure if this is a little bit of necrosis or trauma.  But it does need to be debrided I am not going to refer him to podiatry.      Relevant Orders   Ambulatory referral to Podiatry   Encounter for chronic pain management    Will order UDS. He urinated this AM and only urinate infrequently bc of dialysis.   Refill oxycodone today.PDMP reviewed. Indication for chronic opioid:Chronic back pain and Charcot foot. Medication and dose:Percocet 10/325 # pills per month: 90 Last UDS date:10/22/2019 Opioid  Treatment Agreement signed (Y/N):Y Opioid Treatment Agreement last reviewed with patient:  NCCSRS reviewed this encounter (include red flags):Yes      Relevant Medications   oxyCODONE-acetaminophen (PERCOCET) 10-325 MG tablet (Start on 06/06/2020)   Other Relevant Orders   Comprehensive Drug Analysis,Ur    Other Visit Diagnoses    Dystrophic nail       Relevant Orders   Ambulatory referral to Podiatry      Sounds like the nail affecting his right great toe is likely consistent with a dystrophic nail based on his description though he did not want to remove his boot at this time but we can also have podiatry assess that as well.  Meds ordered this encounter  Medications  . furosemide (LASIX) 80 MG tablet    Sig: Take 2 tablets (160 mg total) by mouth 2 (two) times daily as needed for fluid. May take an additional 1 or 2 tablets (80-160 mg) as needed for bloating    Dispense:  400 tablet    Refill:  1  . DISCONTD: oxyCODONE-acetaminophen (PERCOCET) 10-325 MG tablet    Sig: Take 1 tablet by mouth every 8 (eight) hours as needed for pain.    Dispense:  90 tablet    Refill:  0  . oxyCODONE-acetaminophen (PERCOCET) 10-325 MG tablet    Sig: Take 1 tablet by mouth every 8 (eight) hours as needed for pain.    Dispense:  90 tablet    Refill:  0    Follow-up: Return in about 3 months (around 08/18/2020) for Diabetes follow-up, Pain management.    [name], MD

## 2020-05-19 NOTE — Assessment & Plan Note (Addendum)
Uncontrolled.  A1c is significantly elevated today at 8.2 it was better back in May at 7.4.  Discussed getting back on track with the Trulicity.  We discussed Victoza is a daily option he does not want to do that and says he will try to be more consistent with the Trulicity dosing we also discussed that we can go up on the Trulicity if needed in addition to working on his diet and trying to lose some weight this will be helpful as well.  Follow-up in 90 days.

## 2020-05-19 NOTE — Assessment & Plan Note (Signed)
concerned that he has a scab with thickened skin on the tip of the left toe especially since he is on dialysis.  Not sure if this is a little bit of necrosis or trauma.  But it does need to be debrided I am not going to refer him to podiatry.

## 2020-05-19 NOTE — Assessment & Plan Note (Signed)
Doing well on 160 mg of furosemide twice a day.  He does need his prescription updated so he is not running short.  New prescription sent to pharmacy today

## 2020-05-20 ENCOUNTER — Other Ambulatory Visit (HOSPITAL_COMMUNITY): Payer: Self-pay | Admitting: Nurse Practitioner

## 2020-05-20 DIAGNOSIS — R188 Other ascites: Secondary | ICD-10-CM

## 2020-05-21 ENCOUNTER — Ambulatory Visit (HOSPITAL_COMMUNITY)
Admission: RE | Admit: 2020-05-21 | Discharge: 2020-05-21 | Disposition: A | Discharge status: Home / Self Care | Payer: Medicare Other | Source: Ambulatory Visit | Attending: Nurse Practitioner | Admitting: Nurse Practitioner

## 2020-05-21 DIAGNOSIS — R188 Other ascites: Secondary | ICD-10-CM | POA: Diagnosis not present

## 2020-05-21 HISTORY — PX: IR PARACENTESIS: IMG2679

## 2020-05-21 MED ORDER — LIDOCAINE HCL (PF) 1 % IJ SOLN
INTRAMUSCULAR | Status: AC | PRN
  Administered 2020-05-21: 10 mL

## 2020-05-21 MED ORDER — LIDOCAINE HCL 1 % IJ SOLN
INTRAMUSCULAR | Status: AC
  Filled 2020-05-21: qty 20

## 2020-05-21 NOTE — Procedures (Signed)
PROCEDURE SUMMARY:  Successful US guided paracentesis from right lateral abdomen.  Yielded 6.9 liters of yellow fluid.  No immediate complications.  Pt tolerated well.   Specimen was not sent for labs.  EBL < 7mL  Docia Barrier PA-C 05/21/2020 2:23 PM

## 2020-05-23 ENCOUNTER — Other Ambulatory Visit: Payer: Self-pay | Admitting: Family Medicine

## 2020-05-30 ENCOUNTER — Other Ambulatory Visit: Payer: Self-pay | Admitting: Family Medicine

## 2020-06-08 ENCOUNTER — Other Ambulatory Visit (HOSPITAL_COMMUNITY): Payer: Self-pay | Admitting: Nephrology

## 2020-06-08 ENCOUNTER — Encounter: Payer: Self-pay | Admitting: Family Medicine

## 2020-06-08 DIAGNOSIS — E8770 Fluid overload, unspecified: Secondary | ICD-10-CM

## 2020-06-09 ENCOUNTER — Ambulatory Visit (HOSPITAL_COMMUNITY)
Admission: RE | Admit: 2020-06-09 | Discharge: 2020-06-09 | Disposition: A | Discharge status: Home / Self Care | Payer: Medicare Other | Source: Ambulatory Visit | Attending: Nephrology | Admitting: Nephrology

## 2020-06-09 DIAGNOSIS — R188 Other ascites: Secondary | ICD-10-CM | POA: Insufficient documentation

## 2020-06-09 DIAGNOSIS — E8770 Fluid overload, unspecified: Secondary | ICD-10-CM

## 2020-06-09 HISTORY — PX: IR PARACENTESIS: IMG2679

## 2020-06-09 MED ORDER — LIDOCAINE HCL 1 % IJ SOLN
INTRAMUSCULAR | Status: AC
  Filled 2020-06-09: qty 20

## 2020-06-09 MED ORDER — LIDOCAINE HCL 1 % IJ SOLN
INTRAMUSCULAR | Status: DC | PRN
  Administered 2020-06-09: 10 mL

## 2020-06-09 NOTE — Procedures (Signed)
PROCEDURE SUMMARY:  Successful US guided paracentesis from right abdomen Yielded 6.3 L of clear yellow fluid.  No immediate complications.  Pt tolerated well.   EBL <  2 mL  Theresa Duty, NP 06/09/2020 11:03 AM

## 2020-06-17 ENCOUNTER — Other Ambulatory Visit (HOSPITAL_COMMUNITY): Payer: Self-pay | Admitting: Nephrology

## 2020-06-17 ENCOUNTER — Other Ambulatory Visit: Payer: Self-pay | Admitting: Nephrology

## 2020-06-17 DIAGNOSIS — E8770 Fluid overload, unspecified: Secondary | ICD-10-CM

## 2020-06-18 ENCOUNTER — Ambulatory Visit (HOSPITAL_COMMUNITY)
Admission: RE | Admit: 2020-06-18 | Discharge: 2020-06-18 | Disposition: A | Discharge status: Home / Self Care | Payer: Medicare Other | Source: Ambulatory Visit | Attending: Nephrology | Admitting: Nephrology

## 2020-06-18 ENCOUNTER — Other Ambulatory Visit: Payer: Self-pay

## 2020-06-18 DIAGNOSIS — E8770 Fluid overload, unspecified: Secondary | ICD-10-CM

## 2020-06-18 DIAGNOSIS — R188 Other ascites: Secondary | ICD-10-CM | POA: Diagnosis not present

## 2020-06-18 HISTORY — PX: IR PARACENTESIS: IMG2679

## 2020-06-18 MED ORDER — LIDOCAINE HCL 1 % IJ SOLN
INTRAMUSCULAR | Status: AC
  Filled 2020-06-18: qty 20

## 2020-06-18 MED ORDER — LIDOCAINE HCL 1 % IJ SOLN
INTRAMUSCULAR | Status: DC | PRN
  Administered 2020-06-18: 10 mL

## 2020-06-18 NOTE — Procedures (Signed)
PROCEDURE SUMMARY:  Successful US guided paracentesis from right abdomen.  Yielded 5.3 L of clear yellow fluid.  No immediate complications.  Pt tolerated well.   EBL < 2 mL  Theresa Duty, NP 06/18/2020 2:27 PM

## 2020-06-29 ENCOUNTER — Other Ambulatory Visit: Payer: Self-pay | Admitting: Nephrology

## 2020-06-29 DIAGNOSIS — E8770 Fluid overload, unspecified: Secondary | ICD-10-CM

## 2020-06-30 ENCOUNTER — Other Ambulatory Visit: Payer: Self-pay | Admitting: Family Medicine

## 2020-06-30 ENCOUNTER — Ambulatory Visit (HOSPITAL_COMMUNITY)
Admission: RE | Admit: 2020-06-30 | Discharge: 2020-06-30 | Disposition: A | Discharge status: Home / Self Care | Payer: Medicare Other | Source: Ambulatory Visit | Attending: Nephrology | Admitting: Nephrology

## 2020-06-30 DIAGNOSIS — R188 Other ascites: Secondary | ICD-10-CM | POA: Insufficient documentation

## 2020-06-30 DIAGNOSIS — E8770 Fluid overload, unspecified: Secondary | ICD-10-CM

## 2020-06-30 HISTORY — PX: IR PARACENTESIS: IMG2679

## 2020-06-30 MED ORDER — LIDOCAINE HCL 1 % IJ SOLN
INTRAMUSCULAR | Status: AC | PRN
  Administered 2020-06-30: 20 mL via INTRADERMAL

## 2020-06-30 MED ORDER — LIDOCAINE HCL 1 % IJ SOLN
INTRAMUSCULAR | Status: AC
  Filled 2020-06-30: qty 20

## 2020-06-30 NOTE — Procedures (Signed)
PROCEDURE SUMMARY:  Successful US guided paracentesis from right abdomen.  Yielded 5.6 L of clear yellow fluid.  No immediate complications.  Pt tolerated well.   EBL < 2 mL  Theresa Duty, NP 06/30/2020 10:42 AM

## 2020-07-06 ENCOUNTER — Encounter: Payer: Self-pay | Admitting: Family Medicine

## 2020-07-06 ENCOUNTER — Other Ambulatory Visit: Payer: Self-pay | Admitting: Family Medicine

## 2020-07-06 DIAGNOSIS — G8929 Other chronic pain: Secondary | ICD-10-CM

## 2020-07-06 MED ORDER — LIDOCAINE-PRILOCAINE 2.5-2.5 % EX CREA
TOPICAL_CREAM | CUTANEOUS | 3 refills | Status: DC
Start: 2020-07-06 — End: 2021-01-07

## 2020-07-06 MED ORDER — OXYCODONE-ACETAMINOPHEN 10-325 MG PO TABS: 1.0000 | ORAL_TABLET | Freq: Three times a day (TID) | ORAL | 0 refills | Status: DC | PRN

## 2020-07-08 ENCOUNTER — Other Ambulatory Visit (HOSPITAL_COMMUNITY): Payer: Self-pay | Admitting: Nephrology

## 2020-07-08 DIAGNOSIS — K7460 Unspecified cirrhosis of liver: Secondary | ICD-10-CM

## 2020-07-08 DIAGNOSIS — E8770 Fluid overload, unspecified: Secondary | ICD-10-CM

## 2020-07-09 ENCOUNTER — Other Ambulatory Visit: Payer: Self-pay | Admitting: Family Medicine

## 2020-07-10 ENCOUNTER — Ambulatory Visit (HOSPITAL_COMMUNITY)
Admission: RE | Admit: 2020-07-10 | Discharge: 2020-07-10 | Disposition: A | Discharge status: Home / Self Care | Payer: Medicare Other | Source: Ambulatory Visit | Attending: Nephrology | Admitting: Nephrology

## 2020-07-10 ENCOUNTER — Other Ambulatory Visit: Payer: Self-pay

## 2020-07-10 DIAGNOSIS — K7460 Unspecified cirrhosis of liver: Secondary | ICD-10-CM | POA: Diagnosis present

## 2020-07-10 DIAGNOSIS — E8770 Fluid overload, unspecified: Secondary | ICD-10-CM

## 2020-07-10 DIAGNOSIS — R188 Other ascites: Secondary | ICD-10-CM | POA: Diagnosis not present

## 2020-07-10 HISTORY — PX: IR PARACENTESIS: IMG2679

## 2020-07-10 MED ORDER — LIDOCAINE HCL 1 % IJ SOLN
INTRAMUSCULAR | Status: AC
  Filled 2020-07-10: qty 20

## 2020-07-10 MED ORDER — LIDOCAINE HCL (PF) 0.5 % IJ SOLN
INTRAMUSCULAR | Status: DC | PRN
  Administered 2020-07-10: 10 mL

## 2020-07-10 NOTE — Procedures (Signed)
PROCEDURE SUMMARY:  Successful image-guided paracentesis from the right lateral abdomen.  Yielded 5.3 liters of hazy amber fluid.  No immediate complications.  EBL = 0 mL. Patient tolerated well.   Specimen was not sent for labs.  Please see imaging section of Epic for full dictation.   Ashad Fawbush PA-C 07/10/2020 2:22 PM

## 2020-07-21 ENCOUNTER — Other Ambulatory Visit: Payer: Self-pay | Admitting: Chiropractic Medicine

## 2020-07-21 ENCOUNTER — Other Ambulatory Visit (HOSPITAL_COMMUNITY): Payer: Self-pay | Admitting: Chiropractic Medicine

## 2020-07-21 DIAGNOSIS — E8770 Fluid overload, unspecified: Secondary | ICD-10-CM

## 2020-07-23 ENCOUNTER — Ambulatory Visit (HOSPITAL_COMMUNITY)
Admission: RE | Admit: 2020-07-23 | Discharge: 2020-07-23 | Disposition: A | Discharge status: Home / Self Care | Payer: Medicare Other | Source: Ambulatory Visit | Attending: Chiropractic Medicine | Admitting: Chiropractic Medicine

## 2020-07-23 ENCOUNTER — Other Ambulatory Visit: Payer: Self-pay

## 2020-07-23 ENCOUNTER — Other Ambulatory Visit: Payer: Self-pay | Admitting: Family Medicine

## 2020-07-23 DIAGNOSIS — R188 Other ascites: Secondary | ICD-10-CM | POA: Diagnosis not present

## 2020-07-23 DIAGNOSIS — E8770 Fluid overload, unspecified: Secondary | ICD-10-CM

## 2020-07-23 HISTORY — PX: IR PARACENTESIS: IMG2679

## 2020-07-23 MED ORDER — LIDOCAINE HCL 1 % IJ SOLN
INTRAMUSCULAR | Status: AC
  Filled 2020-07-23: qty 20

## 2020-07-23 MED ORDER — LIDOCAINE HCL 1 % IJ SOLN
INTRAMUSCULAR | Status: DC | PRN
  Administered 2020-07-23: 10 mL

## 2020-07-23 NOTE — Procedures (Signed)
PROCEDURE SUMMARY:  Successful US guided paracentesis from right lower abdomen.  Yielded 5.9 L of clear yellow fluid.  No immediate complications.  Pt tolerated well.   EBL < 2 mL  Theresa Duty, NP 07/23/2020 11:48 AM

## 2020-08-02 ENCOUNTER — Other Ambulatory Visit: Payer: Self-pay | Admitting: Family Medicine

## 2020-08-03 ENCOUNTER — Other Ambulatory Visit (HOSPITAL_COMMUNITY): Payer: Self-pay | Admitting: Nephrology

## 2020-08-03 DIAGNOSIS — E8770 Fluid overload, unspecified: Secondary | ICD-10-CM

## 2020-08-04 ENCOUNTER — Other Ambulatory Visit: Payer: Self-pay

## 2020-08-04 ENCOUNTER — Ambulatory Visit (HOSPITAL_COMMUNITY)
Admission: RE | Admit: 2020-08-04 | Discharge: 2020-08-04 | Disposition: A | Discharge status: Home / Self Care | Payer: Medicare Other | Source: Ambulatory Visit | Attending: Nephrology | Admitting: Nephrology

## 2020-08-04 DIAGNOSIS — R188 Other ascites: Secondary | ICD-10-CM | POA: Insufficient documentation

## 2020-08-04 DIAGNOSIS — E8770 Fluid overload, unspecified: Secondary | ICD-10-CM

## 2020-08-04 HISTORY — PX: IR PARACENTESIS: IMG2679

## 2020-08-04 MED ORDER — LIDOCAINE HCL 1 % IJ SOLN
INTRAMUSCULAR | Status: AC
  Filled 2020-08-04: qty 20

## 2020-08-04 MED ORDER — LIDOCAINE HCL 1 % IJ SOLN
INTRAMUSCULAR | Status: DC | PRN
  Administered 2020-08-04: 10 mL

## 2020-08-04 NOTE — Procedures (Signed)
PROCEDURE SUMMARY:  Successful US guided paracentesis from right abdomen.  Yielded 5.4 L of clear yellow fluid.  No immediate complications.  Pt tolerated well.   EBL > 2 mL  Theresa Duty, NP 08/04/2020 12:02 PM

## 2020-08-07 ENCOUNTER — Encounter: Payer: Self-pay | Admitting: Family Medicine

## 2020-08-07 DIAGNOSIS — G8929 Other chronic pain: Secondary | ICD-10-CM

## 2020-08-10 MED ORDER — OXYCODONE-ACETAMINOPHEN 10-325 MG PO TABS: 1.0000 | ORAL_TABLET | Freq: Three times a day (TID) | ORAL | 0 refills | Status: DC | PRN

## 2020-08-10 NOTE — Telephone Encounter (Signed)
Med sent.

## 2020-08-18 ENCOUNTER — Ambulatory Visit: Payer: Medicare Other | Admitting: Family Medicine

## 2020-08-19 ENCOUNTER — Other Ambulatory Visit: Payer: Self-pay | Admitting: Nephrology

## 2020-08-19 DIAGNOSIS — E8770 Fluid overload, unspecified: Secondary | ICD-10-CM

## 2020-08-20 ENCOUNTER — Other Ambulatory Visit: Payer: Self-pay | Admitting: Family Medicine

## 2020-08-20 ENCOUNTER — Other Ambulatory Visit: Payer: Self-pay

## 2020-08-20 ENCOUNTER — Ambulatory Visit (HOSPITAL_COMMUNITY)
Admission: RE | Admit: 2020-08-20 | Discharge: 2020-08-20 | Disposition: A | Discharge status: Home / Self Care | Payer: Medicare Other | Source: Ambulatory Visit | Attending: Nephrology | Admitting: Nephrology

## 2020-08-20 DIAGNOSIS — R188 Other ascites: Secondary | ICD-10-CM | POA: Insufficient documentation

## 2020-08-20 DIAGNOSIS — E8770 Fluid overload, unspecified: Secondary | ICD-10-CM

## 2020-08-20 HISTORY — PX: IR PARACENTESIS: IMG2679

## 2020-08-20 MED ORDER — LIDOCAINE HCL 1 % IJ SOLN
INTRAMUSCULAR | Status: AC
  Filled 2020-08-20: qty 20

## 2020-08-20 MED ORDER — LIDOCAINE HCL (PF) 1 % IJ SOLN
INTRAMUSCULAR | Status: DC | PRN
  Administered 2020-08-20: 10 mL

## 2020-08-20 NOTE — Procedures (Addendum)
PROCEDURE SUMMARY:  Successful US guided paracentesis from right lateral abdomen.  Yielded 4.8 liters of hazy fluid.  No immediate complications.  Patient tolerated well.  EBL = trace  WENDY S BLAIR PA-C 08/20/2020 2:59 PM

## 2020-08-24 ENCOUNTER — Other Ambulatory Visit: Payer: Self-pay | Admitting: Family Medicine

## 2020-09-02 ENCOUNTER — Ambulatory Visit: Payer: Medicare Other | Admitting: Family Medicine

## 2020-09-02 ENCOUNTER — Other Ambulatory Visit (HOSPITAL_COMMUNITY): Payer: Self-pay | Admitting: Nephrology

## 2020-09-02 ENCOUNTER — Other Ambulatory Visit: Payer: Self-pay | Admitting: Nephrology

## 2020-09-02 DIAGNOSIS — E8770 Fluid overload, unspecified: Secondary | ICD-10-CM

## 2020-09-03 ENCOUNTER — Other Ambulatory Visit: Payer: Self-pay

## 2020-09-03 ENCOUNTER — Ambulatory Visit (HOSPITAL_COMMUNITY)
Admission: RE | Admit: 2020-09-03 | Discharge: 2020-09-03 | Disposition: A | Discharge status: Home / Self Care | Payer: Medicare Other | Source: Ambulatory Visit | Attending: Nephrology | Admitting: Nephrology

## 2020-09-03 DIAGNOSIS — E8770 Fluid overload, unspecified: Secondary | ICD-10-CM | POA: Diagnosis not present

## 2020-09-03 DIAGNOSIS — R188 Other ascites: Secondary | ICD-10-CM | POA: Diagnosis not present

## 2020-09-03 HISTORY — PX: IR PARACENTESIS: IMG2679

## 2020-09-03 MED ORDER — LIDOCAINE HCL 1 % IJ SOLN
INTRAMUSCULAR | Status: AC
  Filled 2020-09-03: qty 20

## 2020-09-03 MED ORDER — LIDOCAINE HCL (PF) 1 % IJ SOLN
INTRAMUSCULAR | Status: DC | PRN
  Administered 2020-09-03: 10 mL

## 2020-09-07 ENCOUNTER — Encounter: Payer: Self-pay | Admitting: Family Medicine

## 2020-09-07 DIAGNOSIS — G8929 Other chronic pain: Secondary | ICD-10-CM

## 2020-09-07 NOTE — Telephone Encounter (Signed)
He has appt tomorrow. We can fill at appt

## 2020-09-08 ENCOUNTER — Ambulatory Visit (INDEPENDENT_AMBULATORY_CARE_PROVIDER_SITE_OTHER): Payer: Medicare Other | Admitting: Family Medicine

## 2020-09-08 ENCOUNTER — Encounter: Payer: Self-pay | Admitting: Family Medicine

## 2020-09-08 ENCOUNTER — Other Ambulatory Visit: Payer: Self-pay

## 2020-09-08 VITALS — BP 117/59 | HR 73 | Ht 72.0 in | Wt 210.8 lb

## 2020-09-08 DIAGNOSIS — N186 End stage renal disease: Secondary | ICD-10-CM

## 2020-09-08 DIAGNOSIS — E11610 Type 2 diabetes mellitus with diabetic neuropathic arthropathy: Secondary | ICD-10-CM

## 2020-09-08 DIAGNOSIS — Z992 Dependence on renal dialysis: Secondary | ICD-10-CM

## 2020-09-08 DIAGNOSIS — G8929 Other chronic pain: Secondary | ICD-10-CM | POA: Diagnosis not present

## 2020-09-08 DIAGNOSIS — Z794 Long term (current) use of insulin: Secondary | ICD-10-CM

## 2020-09-08 DIAGNOSIS — I10 Essential (primary) hypertension: Secondary | ICD-10-CM | POA: Diagnosis not present

## 2020-09-08 DIAGNOSIS — E1122 Type 2 diabetes mellitus with diabetic chronic kidney disease: Secondary | ICD-10-CM | POA: Diagnosis not present

## 2020-09-08 LAB — POCT GLYCOSYLATED HEMOGLOBIN (HGB A1C): Hemoglobin A1C: 7.7 % — AB (ref 4.0–5.6)

## 2020-09-08 MED ORDER — OXYCODONE-ACETAMINOPHEN 10-325 MG PO TABS: 1.0000 | ORAL_TABLET | Freq: Three times a day (TID) | ORAL | 0 refills | Status: DC | PRN

## 2020-09-08 NOTE — Assessment & Plan Note (Signed)
Will order UDS. He urinated this AM and only urinate infrequently bc of dialysis.   Refill oxycodone today.PDMP reviewed. Indication for chronic opioid:Chronic back pain and Charcot foot. Medication and dose:Percocet 10/325 # pills per month: 90 Last UDS date:10/22/2019 Opioid Treatment Agreement signed (Y/N):Y Opioid Treatment Agreement last reviewed with patient:  NCCSRS reviewed this encounter (include red flags):Yes

## 2020-09-08 NOTE — Progress Notes (Signed)
Established Patient Office Visit  Subjective:  Patient ID: James Burke, male    DOB: 05-Jul-1966  Age: 55 y.o. MRN: [id number]  CC:  Chief Complaint  Patient presents with  . Diabetes    HPI James Burke presents for   Diabetes - no hypoglycemic events. No wounds or sores that are not healing well. No increased thirst or urination. Checking glucose at home. Taking medications as prescribed without any side effects.  Follow-up chronic pain management-overall he is doing well on his current regimen.  He is actually hoping to get a new boot/shoe for his Charcot foot on the right.    Written for 90 tabs per month.  He says it is doing well.  He sometimes doses the medication a little bit more closely for dialysis.  Flu shot was done at dialysis.  He is asking today about getting a Covid booster he had significant but short-term symptoms after having his Materna Covid vaccines.  Severe chills and body aches.  Past Medical History:  Diagnosis Date  . Anemia   . Arthritis, septic, knee ([place])   . C. difficile colitis 04/18/2008  . Congestive heart failure ([place])   . Diabetes mellitus    Type II  . DVT (deep venous thrombosis) ([facility])    right leg   . Dyspnea    "when I have too much fluid.'  . ESRD (end stage renal disease) ([place])    Tues, Thurs  . GERD (gastroesophageal reflux disease)   . History of blood transfusion 2017  . Pneumonia     Past Surgical History:  Procedure Laterality Date  . APPENDECTOMY  1995  . BASCILIC VEIN TRANSPOSITION Left 01/03/2017   Procedure: FIRST STAGE BRACHIAL VEIN TRANSPOSITION;  Surgeon: Conrad Fontana, MD;  Location: [facility];  Service: Vascular;  Laterality: Left;  . BASCILIC VEIN TRANSPOSITION Left 04/24/2017   Procedure: LEFT SECOND STAGE BRACHIAL VEIN TRANSPOSITION;  Surgeon: Conrad Oak Creek, MD;  Location: [facility];  Service: Vascular;  Laterality: Left;  . EYE SURGERY    . INSERTION OF DIALYSIS CATHETER Right 01/03/2017   Procedure: INSERTION OF  DIALYSIS CATHETER RIGHT INTERNAL JUGULAR;  Surgeon: Conrad Lucas, MD;  Location: [facility];  Service: Vascular;  Laterality: Right;  . IR PARACENTESIS  12/14/2017  . IR PARACENTESIS  02/01/2018  . IR PARACENTESIS  03/28/2018  . IR PARACENTESIS  05/17/2018  . IR PARACENTESIS  05/31/2018  . IR PARACENTESIS  06/12/2018  . IR PARACENTESIS  06/26/2018  . IR PARACENTESIS  07/10/2018  . IR PARACENTESIS  07/30/2018  . IR PARACENTESIS  08/30/2018  . IR PARACENTESIS  09/13/2018  . IR PARACENTESIS  09/27/2018  . IR PARACENTESIS  10/11/2018  . IR PARACENTESIS  10/30/2018  . IR PARACENTESIS  11/29/2018  . IR PARACENTESIS  01/22/2019  . IR PARACENTESIS  02/21/2019  . IR PARACENTESIS  03/21/2019  . IR PARACENTESIS  04/02/2019  . IR PARACENTESIS  04/18/2019  . IR PARACENTESIS  05/02/2019  . IR PARACENTESIS  05/16/2019  . IR PARACENTESIS  05/30/2019  . IR PARACENTESIS  06/14/2019  . IR PARACENTESIS  06/27/2019  . IR PARACENTESIS  07/11/2019  . IR PARACENTESIS  07/25/2019  . IR PARACENTESIS  08/06/2019  . IR PARACENTESIS  08/15/2019  . IR PARACENTESIS  08/29/2019  . IR PARACENTESIS  09/19/2019  . IR PARACENTESIS  10/04/2019  . IR PARACENTESIS  10/28/2019  . IR PARACENTESIS  11/14/2019  . IR PARACENTESIS  12/03/2019  .  IR PARACENTESIS  12/23/2019  . IR PARACENTESIS  01/03/2020  . IR PARACENTESIS  01/28/2020  . IR PARACENTESIS  02/12/2020  . IR PARACENTESIS  02/27/2020  . IR PARACENTESIS  03/24/2020  . IR PARACENTESIS  04/09/2020  . IR PARACENTESIS  04/23/2020  . IR PARACENTESIS  05/07/2020  . IR PARACENTESIS  05/21/2020  . IR PARACENTESIS  06/09/2020  . IR PARACENTESIS  06/18/2020  . IR PARACENTESIS  06/30/2020  . IR PARACENTESIS  07/10/2020  . IR PARACENTESIS  07/23/2020  . IR PARACENTESIS  08/04/2020  . IR PARACENTESIS  08/20/2020  . IR PARACENTESIS  09/03/2020  . KNEE SURGERY Left 04/2014  . [place]  Bilateral   . PERICARDIOCENTESIS N/A 01/03/2017   Procedure: Pericardiocentesis;  Surgeon: Burnell Blanks, MD;  Location:  [facility];  Service: Cardiovascular;  Laterality: N/A;    Family History  Problem Relation Age of Onset  . Heart disease Other        No family history  . Cancer Brother     Social History   Socioeconomic History  . Marital status: Married    Spouse name: Not on file  . Number of children: 3  . Years of education: Not on file  . Highest education level: Not on file  Occupational History  . Not on file  Tobacco Use  . Smoking status: Never Smoker  . Smokeless tobacco: Former Systems developer    Types: Chew  . Tobacco comment: no chewing 01/2017  Vaping Use  . Vaping Use: Never used  Substance and Sexual Activity  . Alcohol use: No    Alcohol/week: 0.0 standard drinks  . Drug use: No  . Sexual activity: Yes  Other Topics Concern  . Not on file  Social History Narrative  . Not on file   Social Determinants of Health   Financial Resource Strain: Not on file  Food Insecurity: Not on file  Transportation Needs: Not on file  Physical Activity: Not on file  Stress: Not on file  Social Connections: Not on file  Intimate Partner Violence: Not on file    Outpatient Medications Prior to Visit  Medication Sig Dispense Refill  . acetaminophen (TYLENOL) 500 MG tablet Take 1,000 mg by mouth See admin instructions. Take 2 tablets (1000 mg) by mouth during dialysis on Monday, Wednesday, Friday, take 2 tablets (1000 mg) daily at bedtime    . amLODipine (NORVASC) 10 MG tablet TAKE 1 TABLET BY MOUTH EVERY DAY 90 tablet 3  . aspirin EC 81 MG tablet Take 81 mg by mouth See admin instructions. Take one tablet (81 mg) by mouth every other night    . b complex-vitamin c-folic acid (NEPHRO-VITE) 0.8 MG TABS tablet Take 1 tablet by mouth at bedtime.   6  . carvedilol (COREG) 25 MG tablet TAKE 1 TABLET TWICE A DAY WITH FOOD 180 tablet 1  . diphenhydrAMINE (BENADRYL) 25 MG tablet Take 25 mg by mouth See admin instructions. Take 2 tablets (50 mg) by mouth during dialysis on Monday, Wednesday,  Friday, take 1-2 tablets (25-50 mg) daily at bedtime    . ferric citrate (AURYXIA) 1 GM 210 MG(Fe) tablet Take 210-420 mg by mouth See admin instructions. Take 2 tablets (420 mg) by mouth three times daily with meals, take 1 tablet (210 mg) with snacks    . furosemide (LASIX) 80 MG tablet Take 2 tablets (160 mg total) by mouth 2 (two) times daily as needed for fluid. May take an additional 1  or 2 tablets (80-160 mg) as needed for bloating 400 tablet 1  . gabapentin (NEURONTIN) 100 MG capsule Take 2 capsules (200 mg total) by mouth 2 (two) times daily as needed (Give one hour prior to hemodialysis session as needed for anxiety/restlessness). (Patient taking differently: Take 100-200 mg by mouth See admin instructions. On Monday, Wednesday, Friday (dialysis days) - take 2 capsules (200 mg) by mouth before dialysis and take 1 capsule (100 mg) at bedtime; on Sunday, Tuesday, Thursday, Saturday (non-dialysis days) - take 1 capsule (100 mg) twice daily) 220 capsule 0  . hydrALAZINE (APRESOLINE) 25 MG tablet TAKE 1 TABLET BY MOUTH THREE TIMES A DAY AS DIRECTED 270 tablet 3  . hydrOXYzine (ATARAX/VISTARIL) 25 MG tablet Take 25 mg by mouth 2 (two) times daily.     . isosorbide mononitrate (IMDUR) 30 MG 24 hr tablet Take 1 tablet (30 mg total) by mouth at bedtime. 90 tablet 3  . lidocaine-prilocaine (EMLA) cream APPLY THREE TIMES A WEEK AS DIRECTED 1 HOUR PRIOR TO DIALYSIS AND WRAP WITH PLASTIC WRAP 60 g 3  . Methoxy PEG-Epoetin Beta (MIRCERA IJ) Mircera    . omeprazole (PRILOSEC) 40 MG capsule TAKE 1 CAPSULE BY MOUTH EVERY DAY 90 capsule 1  . rOPINIRole (REQUIP) 0.25 MG tablet TAKE 2 TABLETS BEFORE DIALYSIS ON MON, WED, FRI, & 2 TABS DAILY AT BEDTIME 180 tablet 1  . tamsulosin (FLOMAX) 0.4 MG CAPS capsule TAKE 2 CAPSULES BY MOUTH DAILY AFTER SUPPER 180 capsule 2  . traZODone (DESYREL) 50 MG tablet TAKE 1 TABLET AT BEDTIME 90 tablet 0  . TRULICITY 6.96 EX/5.2WU SOPN INJECT 0.75MG EVERY 7 DAYS 6 pen 6  . Vitamin  D, Ergocalciferol, (DRISDOL) 1.25 MG (50000 UNIT) CAPS capsule Take 50,000 Units by mouth every 7 (seven) days.    Marland Kitchen oxyCODONE-acetaminophen (PERCOCET) 10-325 MG tablet Take 1 tablet by mouth every 8 (eight) hours as needed for pain. 90 tablet 0   No facility-administered medications prior to visit.    Allergies  Allergen Reactions  . Prednisone Other (See Comments)    BLINDNESS > [ ? CSCR ? ]  . Tape Other (See Comments)    Paper tape causes blisters   . Simvastatin Other (See Comments)    Myalgia, joint pain  . Lorazepam Anxiety and Other (See Comments)    Nervousness and "fidgets"; legs "need to kick, "electrical currents"  . Methocarbamol Diarrhea  . Seroquel [Quetiapine Fumarate] Other (See Comments)    QT prolongation    ROS Review of Systems    Objective:    Physical Exam Constitutional:      Appearance: He is well-developed and well-nourished.  HENT:     Head: Normocephalic and atraumatic.  Cardiovascular:     Rate and Rhythm: Normal rate and regular rhythm.     Heart sounds: Normal heart sounds.  Pulmonary:     Effort: Pulmonary effort is normal.     Breath sounds: Normal breath sounds.  Skin:    General: Skin is warm and dry.  Neurological:     Mental Status: He is alert and oriented to person, place, and time.  Psychiatric:        Mood and Affect: Mood and affect normal.        Behavior: Behavior normal.     BP (!) 117/59   Pulse 73   Ht 6' (1.829 m)   Wt 210 lb 12.8 oz (95.6 kg)   SpO2 99%   BMI 28.59 kg/m  Wt Readings  from Last 3 Encounters:  09/08/20 210 lb 12.8 oz (95.6 kg)  05/19/20 217 lb 4.8 oz (98.6 kg)  01/30/20 209 lb (94.8 kg)     Health Maintenance Due  Topic Date Due  . COVID-19 Vaccine (3 - Booster for Moderna series) 06/05/2020    There are no preventive care reminders to display for this patient.  Lab Results  Component Value Date   TSH 4.260 10/27/2019   Lab Results  Component Value Date   WBC 5.0 12/14/2019   HGB  10.6 (L) 12/14/2019   HCT 34.3 (L) 12/14/2019   MCV 94.8 12/14/2019   PLT 209 12/14/2019   Lab Results  Component Value Date   NA 138 12/14/2019   K 5.2 (H) 12/14/2019   CO2 27 12/14/2019   GLUCOSE 156 (H) 12/14/2019   BUN 41 (H) 12/14/2019   CREATININE 7.16 (H) 12/14/2019   BILITOT 0.8 10/27/2019   ALKPHOS 69 10/27/2019   AST 18 10/27/2019   ALT 17 10/27/2019   PROT 7.6 10/27/2019   ALBUMIN 3.6 10/27/2019   CALCIUM 8.5 (L) 12/14/2019   ANIONGAP 15 12/14/2019   EGFR 26.0 04/16/2014   Lab Results  Component Value Date   CHOL 115 04/06/2018   Lab Results  Component Value Date   HDL 31 (L) 04/06/2018   Lab Results  Component Value Date   LDLCALC 62 04/06/2018   Lab Results  Component Value Date   TRIG 141 04/06/2018   Lab Results  Component Value Date   CHOLHDL 3.7 04/06/2018   Lab Results  Component Value Date   HGBA1C 7.7 (A) 09/08/2020      Assessment & Plan:   Problem List Items Addressed This Visit      Cardiovascular and Mediastinum   Essential hypertension, benign - Primary (Chronic)    Well controlled. Continue current regimen. Follow up in  3-4 months.         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  Encouraged him to really get back on track and start taking his medication regularly.  I do not want to increase the Trulicity until I know he is taking it consistently.      Relevant Orders   POCT glycosylated hemoglobin (Hb A1C) (Completed)   Controlled type 2 diabetes mellitus with renal manifestation (HCC)    Currently ESKD      Charcot foot due to diabetes mellitus (HCC) (Chronic)    Hoping to get new shoes since his boot is currently worn out      Relevant Medications    oxyCODONE-acetaminophen (PERCOCET) 10-325 MG tablet   Other Relevant Orders   DRUG MONITORING, PANEL 6 WITH CONFIRMATION, URINE     Other   Encounter for chronic pain management    Will order UDS. He urinated this AM and only urinate infrequently bc of dialysis.   Refill oxycodone today.PDMP reviewed. Indication for chronic opioid:Chronic back pain and Charcot foot. Medication and dose:Percocet 10/325 # pills per month: 90 Last UDS date:10/22/2019 Opioid Treatment Agreement signed (Y/N):Y Opioid Treatment Agreement last reviewed with patient:  NCCSRS reviewed this encounter (include red flags):Yes      Relevant Medications   oxyCODONE-acetaminophen (PERCOCET) 10-325 MG tablet   Other Relevant  Orders   DRUG MONITORING, PANEL 6 WITH CONFIRMATION, URINE      Encouraged him to schedule his Covid booster.  Meds ordered this encounter  Medications  . oxyCODONE-acetaminophen (PERCOCET) 10-325 MG tablet    Sig: Take 1 tablet by mouth every 8 (eight) hours as needed for pain.    Dispense:  90 tablet    Refill:  0    Follow-up: Return in about 3 months (around 12/07/2020) for Diabetes follow-up.      [name], MD

## 2020-09-08 NOTE — Assessment & Plan Note (Signed)
Well controlled. Continue current regimen. Follow up in  3-4 months.  

## 2020-09-08 NOTE — Assessment & Plan Note (Signed)
A1c does look a little bit better today at 7.7 which is down from 8.2 but admits he is not been very consistent with his medication and is also had some dietary indiscretion especially over the holidays.  Encouraged him to really get back on track and start taking his medication regularly.  I do not want to increase the Trulicity until I know he is taking it consistently.

## 2020-09-08 NOTE — Assessment & Plan Note (Signed)
Hoping to get new shoes since his boot is currently worn out

## 2020-09-08 NOTE — Assessment & Plan Note (Signed)
Currently ESKD

## 2020-09-11 LAB — DRUG MONITORING, PANEL 6 WITH CONFIRMATION, URINE
6 Acetylmorphine: NEGATIVE ng/mL (ref <10)
Alcohol Metabolites: NEGATIVE ng/mL
Amphetamines: NEGATIVE ng/mL (ref <500)
Barbiturates: NEGATIVE ng/mL (ref <300)
Benzodiazepines: NEGATIVE ng/mL (ref <100)
Cocaine Metabolite: NEGATIVE ng/mL (ref <150)
Codeine: NEGATIVE ng/mL (ref <50)
Creatinine: 81.6 mg/dL
Hydrocodone: NEGATIVE ng/mL (ref <50)
Hydromorphone: NEGATIVE ng/mL (ref <50)
Marijuana Metabolite: NEGATIVE ng/mL (ref <20)
Methadone Metabolite: NEGATIVE ng/mL (ref <100)
Morphine: NEGATIVE ng/mL (ref <50)
Norhydrocodone: NEGATIVE ng/mL (ref <50)
Noroxycodone: 527 ng/mL — ABNORMAL HIGH (ref <50)
Opiates: NEGATIVE ng/mL (ref <100)
Oxidant: NEGATIVE ug/mL
Oxycodone: 1315 ng/mL — ABNORMAL HIGH (ref <50)
Oxycodone: POSITIVE ng/mL — AB (ref <100)
Oxymorphone: 1414 ng/mL — ABNORMAL HIGH (ref <50)
Phencyclidine: NEGATIVE ng/mL (ref <25)
pH: 6.4 (ref 4.5–9.0)

## 2020-09-11 LAB — DM TEMPLATE

## 2020-09-14 ENCOUNTER — Other Ambulatory Visit (HOSPITAL_COMMUNITY): Payer: Self-pay | Admitting: Nephrology

## 2020-09-14 DIAGNOSIS — E8770 Fluid overload, unspecified: Secondary | ICD-10-CM

## 2020-09-15 ENCOUNTER — Other Ambulatory Visit: Payer: Self-pay

## 2020-09-15 ENCOUNTER — Ambulatory Visit (HOSPITAL_COMMUNITY)
Admission: RE | Admit: 2020-09-15 | Discharge: 2020-09-15 | Disposition: A | Discharge status: Home / Self Care | Payer: Medicare Other | Source: Ambulatory Visit | Attending: Nephrology | Admitting: Nephrology

## 2020-09-15 DIAGNOSIS — R188 Other ascites: Secondary | ICD-10-CM | POA: Insufficient documentation

## 2020-09-15 DIAGNOSIS — E8770 Fluid overload, unspecified: Secondary | ICD-10-CM

## 2020-09-15 HISTORY — PX: IR PARACENTESIS: IMG2679

## 2020-09-15 MED ORDER — LIDOCAINE HCL 1 % IJ SOLN
INTRAMUSCULAR | Status: AC
  Filled 2020-09-15: qty 20

## 2020-09-15 MED ORDER — LIDOCAINE HCL 1 % IJ SOLN
INTRAMUSCULAR | Status: DC | PRN
  Administered 2020-09-15: 10 mL

## 2020-09-15 NOTE — Procedures (Signed)
PROCEDURE SUMMARY:  Successful image-guided paracentesis from the right lower abdomen.  Yielded 6.3 liters of hazy yellow fluid.  No immediate complications.  EBL: <1 mL Patient tolerated well.   Specimen was not sent for labs.  Please see imaging section of Epic for full dictation.  Joaquim Nam PA-C 09/15/2020 10:45 AM

## 2020-09-28 ENCOUNTER — Other Ambulatory Visit: Payer: Self-pay | Admitting: Family Medicine

## 2020-09-29 ENCOUNTER — Other Ambulatory Visit (HOSPITAL_COMMUNITY): Payer: Self-pay | Admitting: Nephrology

## 2020-09-29 DIAGNOSIS — R188 Other ascites: Secondary | ICD-10-CM

## 2020-10-01 ENCOUNTER — Ambulatory Visit (HOSPITAL_COMMUNITY)
Admission: RE | Admit: 2020-10-01 | Discharge: 2020-10-01 | Disposition: A | Discharge status: Home / Self Care | Payer: Medicare Other | Source: Ambulatory Visit | Attending: Nephrology | Admitting: Nephrology

## 2020-10-01 ENCOUNTER — Other Ambulatory Visit: Payer: Self-pay

## 2020-10-01 DIAGNOSIS — R188 Other ascites: Secondary | ICD-10-CM | POA: Insufficient documentation

## 2020-10-01 HISTORY — PX: IR PARACENTESIS: IMG2679

## 2020-10-01 MED ORDER — LIDOCAINE HCL (PF) 1 % IJ SOLN
INTRAMUSCULAR | Status: DC | PRN
  Administered 2020-10-01: 10 mL

## 2020-10-01 MED ORDER — LIDOCAINE HCL 1 % IJ SOLN
INTRAMUSCULAR | Status: AC
  Filled 2020-10-01: qty 20

## 2020-10-01 NOTE — Procedures (Signed)
Ultrasound-guided  therapeutic paracentesis performed yielding 5.3 liters of slightly hazy, yellow fluid. No immediate complications. EBL < 1 cc.

## 2020-10-06 ENCOUNTER — Encounter: Payer: Self-pay | Admitting: Family Medicine

## 2020-10-06 DIAGNOSIS — G8929 Other chronic pain: Secondary | ICD-10-CM

## 2020-10-07 MED ORDER — OXYCODONE-ACETAMINOPHEN 10-325 MG PO TABS: 1.0000 | ORAL_TABLET | Freq: Three times a day (TID) | ORAL | 0 refills | Status: DC | PRN

## 2020-10-13 ENCOUNTER — Other Ambulatory Visit: Payer: Self-pay | Admitting: Nephrology

## 2020-10-13 ENCOUNTER — Other Ambulatory Visit (HOSPITAL_COMMUNITY): Payer: Self-pay | Admitting: Nephrology

## 2020-10-13 DIAGNOSIS — R188 Other ascites: Secondary | ICD-10-CM

## 2020-10-15 ENCOUNTER — Other Ambulatory Visit: Payer: Self-pay

## 2020-10-15 ENCOUNTER — Ambulatory Visit (HOSPITAL_COMMUNITY)
Admission: RE | Admit: 2020-10-15 | Discharge: 2020-10-15 | Disposition: A | Discharge status: Home / Self Care | Payer: Medicare Other | Source: Ambulatory Visit | Attending: Nephrology | Admitting: Nephrology

## 2020-10-15 DIAGNOSIS — R188 Other ascites: Secondary | ICD-10-CM | POA: Diagnosis not present

## 2020-10-15 HISTORY — PX: IR PARACENTESIS: IMG2679

## 2020-10-15 MED ORDER — LIDOCAINE HCL (PF) 1 % IJ SOLN
INTRAMUSCULAR | Status: DC | PRN
  Administered 2020-10-15: 10 mL

## 2020-10-15 MED ORDER — LIDOCAINE HCL 1 % IJ SOLN
INTRAMUSCULAR | Status: AC
  Filled 2020-10-15: qty 20

## 2020-10-15 NOTE — Procedures (Signed)
PROCEDURE SUMMARY:  Successful US guided paracentesis from right lateral abdomen.  Yielded 5.5 liters of cloudy, yellow fluid.  No immediate complications.  Pt tolerated well.   Specimen was not sent for labs.  EBL < 15mL  Docia Barrier PA-C 10/15/2020 10:33 AM

## 2020-10-22 ENCOUNTER — Other Ambulatory Visit: Payer: Self-pay | Admitting: Family Medicine

## 2020-10-27 ENCOUNTER — Other Ambulatory Visit (HOSPITAL_COMMUNITY): Payer: Self-pay | Admitting: Nephrology

## 2020-10-27 DIAGNOSIS — K7469 Other cirrhosis of liver: Secondary | ICD-10-CM

## 2020-10-29 ENCOUNTER — Ambulatory Visit (HOSPITAL_COMMUNITY)
Admission: RE | Admit: 2020-10-29 | Discharge: 2020-10-29 | Disposition: A | Discharge status: Home / Self Care | Payer: Medicare Other | Source: Ambulatory Visit | Attending: Nephrology | Admitting: Nephrology

## 2020-10-29 ENCOUNTER — Other Ambulatory Visit: Payer: Self-pay

## 2020-10-29 DIAGNOSIS — R188 Other ascites: Secondary | ICD-10-CM | POA: Diagnosis not present

## 2020-10-29 DIAGNOSIS — K7469 Other cirrhosis of liver: Secondary | ICD-10-CM

## 2020-10-29 HISTORY — PX: IR PARACENTESIS: IMG2679

## 2020-10-29 MED ORDER — LIDOCAINE HCL 1 % IJ SOLN
INTRAMUSCULAR | Status: DC | PRN
  Administered 2020-10-29: 10 mL

## 2020-10-29 MED ORDER — LIDOCAINE HCL 1 % IJ SOLN
INTRAMUSCULAR | Status: AC
  Filled 2020-10-29: qty 20

## 2020-10-29 NOTE — Procedures (Signed)
PROCEDURE SUMMARY:  Successful US guided paracentesis from RLQ.  Yielded 5L of clear yellow fluid.  No immediate complications.  Pt tolerated well.   Specimen was not sent for labs.  EBL < 33mL  Ascencion Dike PA-C 10/29/2020 3:34 PM

## 2020-11-08 ENCOUNTER — Encounter: Payer: Self-pay | Admitting: Family Medicine

## 2020-11-10 ENCOUNTER — Encounter: Payer: Self-pay | Admitting: Family Medicine

## 2020-11-10 DIAGNOSIS — G8929 Other chronic pain: Secondary | ICD-10-CM

## 2020-11-10 MED ORDER — OXYCODONE-ACETAMINOPHEN 10-325 MG PO TABS: 1.0000 | ORAL_TABLET | Freq: Three times a day (TID) | ORAL | 0 refills | Status: DC | PRN

## 2020-11-11 ENCOUNTER — Other Ambulatory Visit: Payer: Self-pay | Admitting: Family Medicine

## 2020-11-11 ENCOUNTER — Other Ambulatory Visit: Payer: Self-pay | Admitting: Chiropractic Medicine

## 2020-11-11 ENCOUNTER — Other Ambulatory Visit (HOSPITAL_COMMUNITY): Payer: Self-pay | Admitting: Chiropractic Medicine

## 2020-11-11 DIAGNOSIS — E8770 Fluid overload, unspecified: Secondary | ICD-10-CM

## 2020-11-12 ENCOUNTER — Ambulatory Visit (HOSPITAL_COMMUNITY)
Admission: RE | Admit: 2020-11-12 | Discharge: 2020-11-12 | Disposition: A | Discharge status: Home / Self Care | Payer: Medicare Other | Source: Ambulatory Visit | Attending: Chiropractic Medicine | Admitting: Chiropractic Medicine

## 2020-11-12 ENCOUNTER — Other Ambulatory Visit: Payer: Self-pay

## 2020-11-12 DIAGNOSIS — R188 Other ascites: Secondary | ICD-10-CM | POA: Diagnosis not present

## 2020-11-12 DIAGNOSIS — E8770 Fluid overload, unspecified: Secondary | ICD-10-CM

## 2020-11-12 HISTORY — PX: IR PARACENTESIS: IMG2679

## 2020-11-12 MED ORDER — LIDOCAINE HCL 1 % IJ SOLN
INTRAMUSCULAR | Status: AC | PRN
  Administered 2020-11-12: 20 mL via INTRADERMAL

## 2020-11-12 MED ORDER — LIDOCAINE HCL 1 % IJ SOLN
INTRAMUSCULAR | Status: AC
  Filled 2020-11-12: qty 20

## 2020-11-12 NOTE — Procedures (Signed)
PROCEDURE SUMMARY:  Successful US guided paracentesis from right abdomen.  Yielded 5.05 L of clear yellow fluid.  No immediate complications.  Pt tolerated well.  EBL < 1 mL  Theresa Duty, NP 11/12/2020 11:27 AM

## 2020-11-19 ENCOUNTER — Other Ambulatory Visit: Payer: Self-pay | Admitting: *Deleted

## 2020-11-19 MED ORDER — GABAPENTIN 100 MG PO CAPS: 100.0000 mg | ORAL_CAPSULE | ORAL | 1 refills | Status: DC

## 2020-11-26 ENCOUNTER — Other Ambulatory Visit: Payer: Self-pay | Admitting: Chiropractic Medicine

## 2020-11-26 ENCOUNTER — Other Ambulatory Visit (HOSPITAL_COMMUNITY): Payer: Self-pay | Admitting: Chiropractic Medicine

## 2020-11-26 DIAGNOSIS — E8770 Fluid overload, unspecified: Secondary | ICD-10-CM

## 2020-11-30 ENCOUNTER — Ambulatory Visit (HOSPITAL_COMMUNITY)
Admission: RE | Admit: 2020-11-30 | Discharge: 2020-11-30 | Disposition: A | Discharge status: Home / Self Care | Payer: Medicare Other | Source: Ambulatory Visit | Attending: Chiropractic Medicine | Admitting: Chiropractic Medicine

## 2020-11-30 ENCOUNTER — Other Ambulatory Visit: Payer: Self-pay

## 2020-11-30 DIAGNOSIS — E8770 Fluid overload, unspecified: Secondary | ICD-10-CM | POA: Insufficient documentation

## 2020-11-30 DIAGNOSIS — R188 Other ascites: Secondary | ICD-10-CM | POA: Diagnosis not present

## 2020-11-30 HISTORY — PX: IR PARACENTESIS: IMG2679

## 2020-11-30 MED ORDER — LIDOCAINE HCL 1 % IJ SOLN
INTRAMUSCULAR | Status: AC
  Filled 2020-11-30: qty 20

## 2020-11-30 MED ORDER — LIDOCAINE HCL (PF) 1 % IJ SOLN
INTRAMUSCULAR | Status: DC | PRN
  Administered 2020-11-30: 10 mL

## 2020-11-30 NOTE — Procedures (Signed)
PROCEDURE SUMMARY:  Successful image-guided paracentesis from the right lateral abdomen.  Yielded 6.0 liters of clear yellow fluid.  No immediate complications.  EBL = 0 mL. Patient tolerated well.   Specimen was not sent for labs.  Please see imaging section of Epic for full dictation.   Claris Pong Cedrik Heindl PA-C 11/30/2020 2:27 PM

## 2020-12-08 ENCOUNTER — Ambulatory Visit (INDEPENDENT_AMBULATORY_CARE_PROVIDER_SITE_OTHER): Payer: Medicare Other | Admitting: Family Medicine

## 2020-12-08 ENCOUNTER — Encounter: Payer: Self-pay | Admitting: Family Medicine

## 2020-12-08 ENCOUNTER — Other Ambulatory Visit: Payer: Self-pay

## 2020-12-08 VITALS — BP 117/54 | HR 75 | Ht 72.0 in | Wt 211.0 lb

## 2020-12-08 DIAGNOSIS — Z794 Long term (current) use of insulin: Secondary | ICD-10-CM | POA: Diagnosis not present

## 2020-12-08 DIAGNOSIS — N2581 Secondary hyperparathyroidism of renal origin: Secondary | ICD-10-CM

## 2020-12-08 DIAGNOSIS — I5043 Acute on chronic combined systolic (congestive) and diastolic (congestive) heart failure: Secondary | ICD-10-CM

## 2020-12-08 DIAGNOSIS — D689 Coagulation defect, unspecified: Secondary | ICD-10-CM

## 2020-12-08 DIAGNOSIS — E11610 Type 2 diabetes mellitus with diabetic neuropathic arthropathy: Secondary | ICD-10-CM

## 2020-12-08 DIAGNOSIS — I130 Hypertensive heart and chronic kidney disease with heart failure and stage 1 through stage 4 chronic kidney disease, or unspecified chronic kidney disease: Secondary | ICD-10-CM

## 2020-12-08 DIAGNOSIS — G8929 Other chronic pain: Secondary | ICD-10-CM

## 2020-12-08 DIAGNOSIS — I10 Essential (primary) hypertension: Secondary | ICD-10-CM

## 2020-12-08 DIAGNOSIS — E1122 Type 2 diabetes mellitus with diabetic chronic kidney disease: Secondary | ICD-10-CM

## 2020-12-08 DIAGNOSIS — M25562 Pain in left knee: Secondary | ICD-10-CM

## 2020-12-08 LAB — POCT GLYCOSYLATED HEMOGLOBIN (HGB A1C): Hemoglobin A1C: 7.2 % — AB (ref 4.0–5.6)

## 2020-12-08 MED ORDER — OXYCODONE-ACETAMINOPHEN 10-325 MG PO TABS: 1.0000 | ORAL_TABLET | Freq: Three times a day (TID) | ORAL | 0 refills | Status: DC | PRN

## 2020-12-08 MED ORDER — MUPIROCIN 2 % EX OINT: TOPICAL_OINTMENT | Freq: Two times a day (BID) | CUTANEOUS | 1 refills | Status: DC

## 2020-12-08 MED ORDER — DOXYCYCLINE HYCLATE 100 MG PO TABS: 100.0000 mg | ORAL_TABLET | Freq: Two times a day (BID) | ORAL | 0 refills | Status: DC

## 2020-12-08 NOTE — Assessment & Plan Note (Signed)
Refill oxycodone today.PDMP reviewed. Indication for chronic opioid:Chronic back pain and Charcot foot. Medication and dose:Percocet 10/325 # pills per month: 90 Last UDS date:09/08/2020 Opioid Treatment Agreement signed (Y/N):Y Opioid Treatment Agreement last reviewed with patient:  NCCSRS reviewed this encounter (include red flags):Yes

## 2020-12-08 NOTE — Progress Notes (Signed)
Established Patient Office Visit  Subjective:  Patient ID: James Burke, male    DOB: 1966/04/10  Age: 55 y.o. MRN: [id number]  CC:  Chief Complaint  Patient presents with  . Diabetes         HPI James Burke presents for   Chronic Pain management -stable on his current regimen of oxycodone which he takes for his leg and Charcot foot.  He has still not gotten a new custom molded boot in fact he is actually looking at more of a specialized shoe with a custom insert.  He did fall over the weekend on Saturday.  He missed stepped off the edge of the driveway and actually fell to his side.  He felt and heard his left knee pop and even hit his head fortunately his head hit the grass.  He says the first couple days he was barely able to walk on that left knee.  That said like he had surgery on and was never able to achieve full extension.  He says he has been icing it and try to keep it propped up in the last couple days it has been gradually getting better initially the pain was radiating all the way down to his foot but that has improved.   Diabetes - no hypoglycemic events. No wounds or sores that are not healing well. No increased thirst or urination. Checking glucose at home. Taking medications as prescribed without any side effects.  Hypertension- Pt denies chest pain, SOB, dizziness, or heart palpitations.  Taking meds as directed w/o problems.  Denies medication side effects.   He also wanted to look at a red sore on his right lower leg just below the knee.  He said he had a couple of these red bumps pop up they eventually heal but then leave pigmented skin he has an active one right now that seems to be worse than the other 2 that he had previously it is red and hot and tender.  Past Medical History:  Diagnosis Date  . Anemia   . Arthritis, septic, knee ([place])   . C. difficile colitis 04/18/2008  . Congestive heart failure ([place])   . Diabetes mellitus    Type II  . DVT (deep  venous thrombosis) ([facility])    right leg   . Dyspnea    "when I have too much fluid.'  . ESRD (end stage renal disease) ([place])    Tues, Thurs  . GERD (gastroesophageal reflux disease)   . History of blood transfusion 2017  . Pneumonia     Past Surgical History:  Procedure Laterality Date  . APPENDECTOMY  1995  . BASCILIC VEIN TRANSPOSITION Left 01/03/2017   Procedure: FIRST STAGE BRACHIAL VEIN TRANSPOSITION;  Surgeon: Conrad Malaga, MD;  Location: [place] [facility];  Service: Vascular;  Laterality: Left;  . BASCILIC VEIN TRANSPOSITION Left 04/24/2017   Procedure: LEFT SECOND STAGE BRACHIAL VEIN TRANSPOSITION;  Surgeon: Conrad Southworth, MD;  Location: [facility];  Service: Vascular;  Laterality: Left;  . EYE SURGERY    . INSERTION OF DIALYSIS CATHETER Right 01/03/2017   Procedure: INSERTION OF DIALYSIS CATHETER RIGHT INTERNAL JUGULAR;  Surgeon: Conrad , MD;  Location: [facility];  Service: Vascular;  Laterality: Right;  . IR PARACENTESIS  12/14/2017  . IR PARACENTESIS  02/01/2018  . IR PARACENTESIS  03/28/2018  . IR PARACENTESIS  05/17/2018  . IR PARACENTESIS  05/31/2018  . IR PARACENTESIS  06/12/2018  . IR PARACENTESIS  06/26/2018  .  IR PARACENTESIS  07/10/2018  . IR PARACENTESIS  07/30/2018  . IR PARACENTESIS  08/30/2018  . IR PARACENTESIS  09/13/2018  . IR PARACENTESIS  09/27/2018  . IR PARACENTESIS  10/11/2018  . IR PARACENTESIS  10/30/2018  . IR PARACENTESIS  11/29/2018  . IR PARACENTESIS  01/22/2019  . IR PARACENTESIS  02/21/2019  . IR PARACENTESIS  03/21/2019  . IR PARACENTESIS  04/02/2019  . IR PARACENTESIS  04/18/2019  . IR PARACENTESIS  05/02/2019  . IR PARACENTESIS  05/16/2019  . IR PARACENTESIS  05/30/2019  . IR PARACENTESIS  06/14/2019  . IR PARACENTESIS  06/27/2019  . IR PARACENTESIS  07/11/2019  . IR PARACENTESIS  07/25/2019  . IR PARACENTESIS  08/06/2019  . IR PARACENTESIS  08/15/2019  . IR PARACENTESIS  08/29/2019  . IR PARACENTESIS  09/19/2019  . IR PARACENTESIS  10/04/2019  . IR PARACENTESIS   10/28/2019  . IR PARACENTESIS  11/14/2019  . IR PARACENTESIS  12/03/2019  . IR PARACENTESIS  12/23/2019  . IR PARACENTESIS  01/03/2020  . IR PARACENTESIS  01/28/2020  . IR PARACENTESIS  02/12/2020  . IR PARACENTESIS  02/27/2020  . IR PARACENTESIS  03/24/2020  . IR PARACENTESIS  04/09/2020  . IR PARACENTESIS  04/23/2020  . IR PARACENTESIS  05/07/2020  . IR PARACENTESIS  05/21/2020  . IR PARACENTESIS  06/09/2020  . IR PARACENTESIS  06/18/2020  . IR PARACENTESIS  06/30/2020  . IR PARACENTESIS  07/10/2020  . IR PARACENTESIS  07/23/2020  . IR PARACENTESIS  08/04/2020  . IR PARACENTESIS  08/20/2020  . IR PARACENTESIS  09/03/2020  . IR PARACENTESIS  09/15/2020  . IR PARACENTESIS  10/01/2020  . IR PARACENTESIS  10/15/2020  . IR PARACENTESIS  10/29/2020  . IR PARACENTESIS  11/12/2020  . IR PARACENTESIS  11/30/2020  . KNEE SURGERY Left 04/2014  . [place]  Bilateral   . PERICARDIOCENTESIS N/A 01/03/2017   Procedure: Pericardiocentesis;  Surgeon: Burnell Blanks, MD;  Location: [facility];  Service: Cardiovascular;  Laterality: N/A;    Family History  Problem Relation Age of Onset  . Heart disease Other        No family history  . Cancer Brother     Social History   Socioeconomic History  . Marital status: Married    Spouse name: Not on file  . Number of children: 3  . Years of education: Not on file  . Highest education level: Not on file  Occupational History  . Not on file  Tobacco Use  . Smoking status: Never Smoker  . Smokeless tobacco: Former Systems developer    Types: Chew  . Tobacco comment: no chewing 01/2017  Vaping Use  . Vaping Use: Never used  Substance and Sexual Activity  . Alcohol use: No    Alcohol/week: 0.0 standard drinks  . Drug use: No  . Sexual activity: Yes  Other Topics Concern  . Not on file  Social History Narrative  . Not on file   Social Determinants of Health   Financial Resource Strain: Not on file  Food Insecurity: Not on file  Transportation Needs: Not  on file  Physical Activity: Not on file  Stress: Not on file  Social Connections: Not on file  Intimate Partner Violence: Not on file    Outpatient Medications Prior to Visit  Medication Sig Dispense Refill  . acetaminophen (TYLENOL) 500 MG tablet Take 1,000 mg by mouth See admin instructions. Take 2 tablets (1000 mg) by mouth  during dialysis on Monday, Wednesday, Friday, take 2 tablets (1000 mg) daily at bedtime    . amLODipine (NORVASC) 10 MG tablet TAKE 1 TABLET BY MOUTH EVERY DAY 90 tablet 3  . aspirin EC 81 MG tablet Take 81 mg by mouth See admin instructions. Take one tablet (81 mg) by mouth every other night    . b complex-vitamin c-folic acid (NEPHRO-VITE) 0.8 MG TABS tablet Take 1 tablet by mouth at bedtime.   6  . carvedilol (COREG) 25 MG tablet TAKE 1 TABLET TWICE A DAY WITH FOOD 180 tablet 1  . diphenhydrAMINE (BENADRYL) 25 MG tablet Take 25 mg by mouth See admin instructions. Take 2 tablets (50 mg) by mouth during dialysis on Monday, Wednesday, Friday, take 1-2 tablets (25-50 mg) daily at bedtime    . ferric citrate (AURYXIA) 1 GM 210 MG(Fe) tablet Take 210-420 mg by mouth See admin instructions. Take 2 tablets (420 mg) by mouth three times daily with meals, take 1 tablet (210 mg) with snacks    . furosemide (LASIX) 80 MG tablet TAKE 1 TABLET (80 MG TOTAL) BY MOUTH 2 (TWO) TIMES DAILY. 180 tablet 1  . gabapentin (NEURONTIN) 100 MG capsule Take 1-2 capsules (100-200 mg total) by mouth See admin instructions. On Monday, Wednesday, Friday (dialysis days) - take 2 capsules (200 mg) by mouth before dialysis and take 1 capsule (100 mg) at bedtime; on Sunday, Tuesday, Thursday, Saturday (non-dialysis days) - take 1 capsule (100 mg) twice daily 220 capsule 1  . hydrALAZINE (APRESOLINE) 25 MG tablet TAKE 1 TABLET BY MOUTH THREE TIMES A DAY AS DIRECTED 270 tablet 3  . hydrOXYzine (ATARAX/VISTARIL) 25 MG tablet Take 25 mg by mouth 2 (two) times daily.     . isosorbide mononitrate (IMDUR) 30 MG  24 hr tablet Take 1 tablet (30 mg total) by mouth at bedtime. 90 tablet 3  . lidocaine-prilocaine (EMLA) cream APPLY THREE TIMES A WEEK AS DIRECTED 1 HOUR PRIOR TO DIALYSIS AND WRAP WITH PLASTIC WRAP 60 g 3  . LOKELMA 10 g PACK packet SMARTSIG:1 Packet(s) By Mouth 3 Times a Week    . omeprazole (PRILOSEC) 40 MG capsule TAKE 1 CAPSULE BY MOUTH EVERY DAY 90 capsule 1  . rOPINIRole (REQUIP) 0.25 MG tablet TAKE 2 TABLETS BEFORE DIALYSIS ON MON, WED, FRI, & 2 TABS DAILY AT BEDTIME 180 tablet 1  . tamsulosin (FLOMAX) 0.4 MG CAPS capsule TAKE 2 CAPSULES BY MOUTH DAILY AFTER SUPPER 180 capsule 2  . traZODone (DESYREL) 50 MG tablet TAKE 1 TABLET BY MOUTH EVERYDAY AT BEDTIME 90 tablet 0  . TRULICITY 1.22 QM/2.5OI SOPN INJECT 0.75MG EVERY 7 DAYS 6 pen 6  . Vitamin D, Ergocalciferol, (DRISDOL) 1.25 MG (50000 UNIT) CAPS capsule Take 50,000 Units by mouth every 7 (seven) days.    Marland Kitchen oxyCODONE-acetaminophen (PERCOCET) 10-325 MG tablet Take 1 tablet by mouth every 8 (eight) hours as needed for pain. 90 tablet 0   No facility-administered medications prior to visit.    Allergies  Allergen Reactions  . Prednisone Other (See Comments)    BLINDNESS > [ ? CSCR ? ]  . Tape Other (See Comments)    Paper tape causes blisters   . Simvastatin Other (See Comments)    Myalgia, joint pain  . Lorazepam Anxiety and Other (See Comments)    Nervousness and "fidgets"; legs "need to kick, "electrical currents"  . Methocarbamol Diarrhea  . Seroquel [Quetiapine Fumarate] Other (See Comments)    QT prolongation  ROS Review of Systems    Objective:    Physical Exam Constitutional:      Appearance: He is well-developed.  HENT:     Head: Normocephalic and atraumatic.  Cardiovascular:     Rate and Rhythm: Normal rate and regular rhythm.     Heart sounds: Normal heart sounds.  Pulmonary:     Effort: Pulmonary effort is normal.     Breath sounds: Normal breath sounds.  Skin:    General: Skin is warm and dry.      Comments: Erythematous fluctuant lesion on the upper left medial calf.  There is a scab in the center.  No active drainage.  Measures approximately 4 to 5 cm.  Neurological:     Mental Status: He is alert and oriented to person, place, and time.  Psychiatric:        Behavior: Behavior normal.     BP (!) 117/54   Pulse 75   Ht 6' (1.829 m)   Wt 211 lb (95.7 kg)   SpO2 96%   BMI 28.62 kg/m  Wt Readings from Last 3 Encounters:  12/08/20 211 lb (95.7 kg)  09/08/20 210 lb 12.8 oz (95.6 kg)  05/19/20 217 lb 4.8 oz (98.6 kg)     Health Maintenance Due  Topic Date Due  . OPHTHALMOLOGY EXAM  10/06/2018    There are no preventive care reminders to display for this patient.  Lab Results  Component Value Date   TSH 4.260 10/27/2019   Lab Results  Component Value Date   WBC 5.0 12/14/2019   HGB 10.6 (L) 12/14/2019   HCT 34.3 (L) 12/14/2019   MCV 94.8 12/14/2019   PLT 209 12/14/2019   Lab Results  Component Value Date   NA 138 12/14/2019   K 5.2 (H) 12/14/2019   CO2 27 12/14/2019   GLUCOSE 156 (H) 12/14/2019   BUN 41 (H) 12/14/2019   CREATININE 7.16 (H) 12/14/2019   BILITOT 0.8 10/27/2019   ALKPHOS 69 10/27/2019   AST 18 10/27/2019   ALT 17 10/27/2019   PROT 7.6 10/27/2019   ALBUMIN 3.6 10/27/2019   CALCIUM 8.5 (L) 12/14/2019   ANIONGAP 15 12/14/2019   EGFR 26.0 04/16/2014   Lab Results  Component Value Date   CHOL 115 04/06/2018   Lab Results  Component Value Date   HDL 31 (L) 04/06/2018   Lab Results  Component Value Date   LDLCALC 62 04/06/2018   Lab Results  Component Value Date   TRIG 141 04/06/2018   Lab Results  Component Value Date   CHOLHDL 3.7 04/06/2018   Lab Results  Component Value Date   HGBA1C 7.2 (A) 12/08/2020      Assessment & Plan:   Problem List Items Addressed This Visit      Cardiovascular and Mediastinum   Hypertensive heart and chronic kidney disease with heart failure and stage 1 through stage 4 chronic kidney  disease, or chronic kidney disease ([place])   Essential hypertension, benign - Primary (Chronic)    Well controlled. Continue current regimen. Follow up in  6 mo       Acute on chronic combined systolic and diastolic congestive heart failure ([facility])    No sign of volume overload today he does report that he does get some swelling in his legs when he is up on his feet a lot.        Endocrine   Secondary hyperparathyroidism of renal origin [facility])    Followed by nephrology.  Controlled type 2 diabetes mellitus with renal manifestation ([place])    He was able to bring his A1c down today which is fantastic.  It does look better.  Lab Results  Component Value Date   HGBA1C 7.2 (A) 12/08/2020         Relevant Orders   POCT glycosylated hemoglobin (Hb A1C) (Completed)   Charcot foot due to diabetes mellitus ([facility]CC) (Chronic)    Leaning on getting a specialized shoe instead of the hard shell boot he is currently in which is worn out      Relevant Medications   oxyCODONE-acetaminophen (PERCOCET) 10-325 MG tablet     Hematopoietic and Hemostatic   Coagulation defect, unspecified ([place])     Other   Encounter for chronic pain management    Refill oxycodone today.PDMP reviewed. Indication for chronic opioid:Chronic back pain and Charcot foot. Medication and dose:Percocet 10/325 # pills per month: 90 Last UDS date:09/08/2020 Opioid Treatment Agreement signed (Y/N):Y Opioid Treatment Agreement last reviewed with patient:  NCCSRS reviewed this encounter (include red flags):Yes      Relevant Medications   oxyCODONE-acetaminophen (PERCOCET) 10-325 MG tablet    Other Visit Diagnoses    Acute pain of left knee          Acute left knee pain-still some increased warmth and swelling over the left knee he feels like it is gradually getting better and declines any additional work-up at this time but did encourage him to call me back if at any point he would like further work-up  including x-rays.  Abscess on right lower leg-we will go ahead and treat with oral doxycycline also gave him mupirocin ointment to use if he starts to get a new lesion I suspect this probably originally started as folliculitis and has now become infected.  He does want to work on trying to lose some weight.  Meds ordered this encounter  Medications  . oxyCODONE-acetaminophen (PERCOCET) 10-325 MG tablet    Sig: Take 1 tablet by mouth every 8 (eight) hours as needed for pain.    Dispense:  90 tablet    Refill:  0  . doxycycline (VIBRA-TABS) 100 MG tablet    Sig: Take 1 tablet (100 mg total) by mouth 2 (two) times daily.    Dispense:  14 tablet    Refill:  0  . mupirocin ointment (BACTROBAN) 2 %    Sig: Apply topically 2 (two) times daily.    Dispense:  15 g    Refill:  1    Follow-up: Return in about 3 months (around 03/09/2021) for Diabetes follow-up.    [name], MD

## 2020-12-08 NOTE — Assessment & Plan Note (Signed)
He was able to bring his A1c down today which is fantastic.  It does look better.  Lab Results  Component Value Date   HGBA1C 7.2 (A) 12/08/2020

## 2020-12-08 NOTE — Assessment & Plan Note (Signed)
Leaning on getting a specialized shoe instead of the hard shell boot he is currently in which is worn out

## 2020-12-08 NOTE — Assessment & Plan Note (Signed)
Well controlled. Continue current regimen. Follow up in  6 mo  

## 2020-12-08 NOTE — Assessment & Plan Note (Signed)
Followed by nephrology. 

## 2020-12-08 NOTE — Assessment & Plan Note (Signed)
No sign of volume overload today he does report that he does get some swelling in his legs when he is up on his feet a lot.

## 2020-12-12 ENCOUNTER — Other Ambulatory Visit: Payer: Self-pay | Admitting: Family Medicine

## 2020-12-15 ENCOUNTER — Other Ambulatory Visit: Payer: Self-pay | Admitting: Nephrology

## 2020-12-15 ENCOUNTER — Other Ambulatory Visit (HOSPITAL_COMMUNITY): Payer: Self-pay | Admitting: Nephrology

## 2020-12-15 DIAGNOSIS — R188 Other ascites: Secondary | ICD-10-CM

## 2020-12-17 ENCOUNTER — Other Ambulatory Visit: Payer: Self-pay

## 2020-12-17 ENCOUNTER — Ambulatory Visit (HOSPITAL_COMMUNITY)
Admission: RE | Admit: 2020-12-17 | Discharge: 2020-12-17 | Disposition: A | Discharge status: Home / Self Care | Payer: Medicare Other | Source: Ambulatory Visit | Attending: Nephrology | Admitting: Nephrology

## 2020-12-17 DIAGNOSIS — R188 Other ascites: Secondary | ICD-10-CM | POA: Diagnosis not present

## 2020-12-17 HISTORY — PX: IR PARACENTESIS: IMG2679

## 2020-12-17 MED ORDER — LIDOCAINE HCL (PF) 1 % IJ SOLN
INTRAMUSCULAR | Status: AC
  Filled 2020-12-17: qty 30

## 2020-12-17 NOTE — Procedures (Signed)
PROCEDURE SUMMARY:  Successful image-guided paracentesis from the right lower abdomen.  Yielded 5.2 liters of clear yellow fluid.  No immediate complications.  EBL: < 1 mL Patient tolerated well.   Specimen was not sent for labs.  Please see imaging section of Epic for full dictation.  Joaquim Nam PA-C 12/17/2020 10:12 AM

## 2020-12-29 ENCOUNTER — Other Ambulatory Visit (HOSPITAL_COMMUNITY): Payer: Self-pay | Admitting: Chiropractic Medicine

## 2020-12-29 ENCOUNTER — Other Ambulatory Visit: Payer: Self-pay | Admitting: Chiropractic Medicine

## 2020-12-29 DIAGNOSIS — E8770 Fluid overload, unspecified: Secondary | ICD-10-CM

## 2020-12-31 ENCOUNTER — Ambulatory Visit (HOSPITAL_COMMUNITY)
Admission: RE | Admit: 2020-12-31 | Discharge: 2020-12-31 | Disposition: A | Discharge status: Home / Self Care | Payer: Medicare Other | Source: Ambulatory Visit | Attending: Chiropractic Medicine | Admitting: Chiropractic Medicine

## 2020-12-31 ENCOUNTER — Other Ambulatory Visit: Payer: Self-pay

## 2020-12-31 DIAGNOSIS — E8770 Fluid overload, unspecified: Secondary | ICD-10-CM

## 2020-12-31 DIAGNOSIS — R188 Other ascites: Secondary | ICD-10-CM | POA: Diagnosis not present

## 2020-12-31 HISTORY — PX: IR PARACENTESIS: IMG2679

## 2020-12-31 MED ORDER — LIDOCAINE HCL 1 % IJ SOLN
INTRAMUSCULAR | Status: AC
  Filled 2020-12-31: qty 20

## 2020-12-31 MED ORDER — LIDOCAINE HCL (PF) 1 % IJ SOLN
INTRAMUSCULAR | Status: DC | PRN
  Administered 2020-12-31: 10 mL

## 2020-12-31 NOTE — Procedures (Signed)
PROCEDURE SUMMARY:  Successful US guided paracentesis from right lateral abdomen.  Yielded 6.2 of yellow fluid.  No immediate complications.  Pt tolerated well.   Specimen was not sent for labs.  EBL < 52mL  Docia Barrier PA-C 12/31/2020 11:41 AM

## 2021-01-05 ENCOUNTER — Encounter: Payer: Self-pay | Admitting: Family Medicine

## 2021-01-05 ENCOUNTER — Other Ambulatory Visit: Payer: Self-pay | Admitting: Family Medicine

## 2021-01-05 DIAGNOSIS — G8929 Other chronic pain: Secondary | ICD-10-CM

## 2021-01-07 MED ORDER — OXYCODONE-ACETAMINOPHEN 10-325 MG PO TABS: 1.0000 | ORAL_TABLET | Freq: Three times a day (TID) | ORAL | 0 refills | Status: DC | PRN

## 2021-01-07 NOTE — Telephone Encounter (Signed)
sent 

## 2021-01-10 ENCOUNTER — Other Ambulatory Visit: Payer: Self-pay | Admitting: Family Medicine

## 2021-01-12 ENCOUNTER — Other Ambulatory Visit: Payer: Self-pay

## 2021-01-12 ENCOUNTER — Ambulatory Visit (HOSPITAL_COMMUNITY)
Admission: RE | Admit: 2021-01-12 | Discharge: 2021-01-12 | Disposition: A | Discharge status: Home / Self Care | Payer: Medicare Other | Source: Ambulatory Visit | Attending: Chiropractic Medicine | Admitting: Chiropractic Medicine

## 2021-01-12 DIAGNOSIS — E8770 Fluid overload, unspecified: Secondary | ICD-10-CM

## 2021-01-12 DIAGNOSIS — R188 Other ascites: Secondary | ICD-10-CM | POA: Diagnosis not present

## 2021-01-12 HISTORY — PX: IR PARACENTESIS: IMG2679

## 2021-01-12 MED ORDER — LIDOCAINE HCL 1 % IJ SOLN
INTRAMUSCULAR | Status: AC
  Filled 2021-01-12: qty 20

## 2021-01-12 MED ORDER — LIDOCAINE HCL 1 % IJ SOLN
INTRAMUSCULAR | Status: DC | PRN
  Administered 2021-01-12: 10 mL

## 2021-01-12 NOTE — Procedures (Signed)
PROCEDURE SUMMARY:  Successful image-guided paracentesis from the right lateral abdomen.  Yielded 6.9 liters of clear pale fluid.  No immediate complications.  EBL = 0 mL. Patient tolerated well.   Specimen was not sent for labs.  Please see imaging section of Epic for full dictation.   Claris Pong Vika Buske PA-C 01/12/2021 9:21 AM

## 2021-01-25 ENCOUNTER — Other Ambulatory Visit (HOSPITAL_COMMUNITY): Payer: Self-pay | Admitting: Nephrology

## 2021-01-25 ENCOUNTER — Other Ambulatory Visit: Payer: Self-pay | Admitting: Nephrology

## 2021-01-25 DIAGNOSIS — R188 Other ascites: Secondary | ICD-10-CM

## 2021-01-26 ENCOUNTER — Other Ambulatory Visit: Payer: Self-pay

## 2021-01-26 ENCOUNTER — Ambulatory Visit (HOSPITAL_COMMUNITY)
Admission: RE | Admit: 2021-01-26 | Discharge: 2021-01-26 | Disposition: A | Discharge status: Home / Self Care | Payer: Medicare Other | Source: Ambulatory Visit | Attending: Nephrology | Admitting: Nephrology

## 2021-01-26 DIAGNOSIS — R188 Other ascites: Secondary | ICD-10-CM

## 2021-01-26 DIAGNOSIS — E8770 Fluid overload, unspecified: Secondary | ICD-10-CM | POA: Insufficient documentation

## 2021-01-26 HISTORY — PX: IR PARACENTESIS: IMG2679

## 2021-01-26 MED ORDER — LIDOCAINE HCL (PF) 1 % IJ SOLN
INTRAMUSCULAR | Status: AC
  Filled 2021-01-26: qty 30

## 2021-01-26 MED ORDER — LIDOCAINE HCL (PF) 1 % IJ SOLN
INTRAMUSCULAR | Status: DC | PRN
  Administered 2021-01-26: 10 mL

## 2021-01-26 NOTE — Procedures (Signed)
Ultrasound-guided therapeutic paracentesis performed yielding 7.5 liters of hazy yellow/cream colored fluid. No immediate complications. EBL none.

## 2021-02-02 ENCOUNTER — Other Ambulatory Visit (HOSPITAL_COMMUNITY): Payer: Self-pay | Admitting: Nephrology

## 2021-02-02 DIAGNOSIS — E8770 Fluid overload, unspecified: Secondary | ICD-10-CM

## 2021-02-03 ENCOUNTER — Other Ambulatory Visit: Payer: Self-pay | Admitting: Family Medicine

## 2021-02-03 ENCOUNTER — Encounter: Payer: Self-pay | Admitting: Family Medicine

## 2021-02-03 DIAGNOSIS — I5042 Chronic combined systolic (congestive) and diastolic (congestive) heart failure: Secondary | ICD-10-CM

## 2021-02-03 DIAGNOSIS — G8929 Other chronic pain: Secondary | ICD-10-CM

## 2021-02-03 MED ORDER — FUROSEMIDE 80 MG PO TABS
ORAL_TABLET | ORAL | 3 refills | Status: AC
Start: 2021-02-03 — End: ?

## 2021-02-03 NOTE — Telephone Encounter (Signed)
It looks like he was previously on the correct instructions and at some point in February they sent a med refill and we approved it.  Changed it back.  So should be correct now.

## 2021-02-04 ENCOUNTER — Other Ambulatory Visit: Payer: Self-pay

## 2021-02-04 ENCOUNTER — Ambulatory Visit (HOSPITAL_COMMUNITY)
Admission: RE | Admit: 2021-02-04 | Discharge: 2021-02-04 | Disposition: A | Discharge status: Home / Self Care | Payer: Medicare Other | Source: Ambulatory Visit | Attending: Nephrology | Admitting: Nephrology

## 2021-02-04 DIAGNOSIS — R188 Other ascites: Secondary | ICD-10-CM | POA: Diagnosis not present

## 2021-02-04 DIAGNOSIS — E8770 Fluid overload, unspecified: Secondary | ICD-10-CM

## 2021-02-04 HISTORY — PX: IR PARACENTESIS: IMG2679

## 2021-02-04 MED ORDER — LIDOCAINE HCL 1 % IJ SOLN
INTRAMUSCULAR | Status: AC
  Filled 2021-02-04: qty 20

## 2021-02-04 MED ORDER — LIDOCAINE HCL 1 % IJ SOLN
INTRAMUSCULAR | Status: DC | PRN
  Administered 2021-02-04: 10 mL

## 2021-02-05 MED ORDER — OXYCODONE-ACETAMINOPHEN 10-325 MG PO TABS: 1.0000 | ORAL_TABLET | Freq: Three times a day (TID) | ORAL | 0 refills | Status: DC | PRN

## 2021-02-22 ENCOUNTER — Other Ambulatory Visit: Payer: Self-pay | Admitting: Nephrology

## 2021-02-22 ENCOUNTER — Other Ambulatory Visit (HOSPITAL_COMMUNITY): Payer: Self-pay | Admitting: Nephrology

## 2021-02-22 DIAGNOSIS — R188 Other ascites: Secondary | ICD-10-CM

## 2021-02-23 ENCOUNTER — Ambulatory Visit (HOSPITAL_COMMUNITY)
Admission: RE | Admit: 2021-02-23 | Discharge: 2021-02-23 | Disposition: A | Discharge status: Home / Self Care | Payer: Medicare Other | Source: Ambulatory Visit | Attending: Nephrology | Admitting: Nephrology

## 2021-02-23 ENCOUNTER — Other Ambulatory Visit: Payer: Self-pay

## 2021-02-23 ENCOUNTER — Other Ambulatory Visit (HOSPITAL_COMMUNITY): Payer: Self-pay | Admitting: Nephrology

## 2021-02-23 DIAGNOSIS — R188 Other ascites: Secondary | ICD-10-CM

## 2021-02-23 DIAGNOSIS — E8770 Fluid overload, unspecified: Secondary | ICD-10-CM | POA: Insufficient documentation

## 2021-02-23 HISTORY — PX: IR PARACENTESIS: IMG2679

## 2021-02-23 MED ORDER — LIDOCAINE HCL 1 % IJ SOLN
INTRAMUSCULAR | Status: AC | PRN
  Administered 2021-02-23: 10 mL

## 2021-02-23 MED ORDER — LIDOCAINE HCL 1 % IJ SOLN
INTRAMUSCULAR | Status: AC
  Filled 2021-02-23: qty 20

## 2021-02-23 NOTE — Procedures (Signed)
PROCEDURE SUMMARY:  Successful image-guided paracentesis from the right lower abdomen.  Yielded 7.2 L liters of clear yellow fluid.  No immediate complications.  EBL = trace. Patient tolerated well.   Specimen was not sent for labs.  Please see imaging section of Epic for full dictation.   Armando Gang Kendryck Lacroix PA-C 02/23/2021 9:18 AM

## 2021-02-28 ENCOUNTER — Other Ambulatory Visit: Payer: Self-pay | Admitting: Family Medicine

## 2021-03-09 ENCOUNTER — Ambulatory Visit: Payer: Medicare Other | Admitting: Family Medicine

## 2021-03-09 ENCOUNTER — Telehealth: Payer: Self-pay | Admitting: Family Medicine

## 2021-03-09 NOTE — Telephone Encounter (Signed)
Patient didn't say why he wanted to cancel and reschedule appointment.

## 2021-03-09 NOTE — Progress Notes (Deleted)
Established Patient Office Visit  Subjective:  Patient ID: James Burke, male    DOB: [birth year]  Age: 55 y.o. MRN: [id number]  CC: No chief complaint on file.   HPI James Burke presents for 3 mo f/u  Chronic Pain management -stable on his current regimen of oxycodone which he takes for his leg and Charcot foot.  He has still not gotten a new custom molded boot in fact he is actually looking at more of a specialized shoe with a custom insert.    Diabetes - no hypoglycemic events. No wounds or sores that are not healing well. No increased thirst or urination. Checking glucose at home. Taking medications as prescribed without any side effects.  Hypertension- Pt denies chest pain, SOB, dizziness, or heart palpitations.  Taking meds as directed w/o problems.  Denies medication side effects.      Past Medical History:  Diagnosis Date  . Anemia   . Arthritis, septic, knee ([place])   . C. difficile colitis 04/18/2008  . Congestive heart failure ([place])   . Diabetes mellitus    Type II  . DVT (deep venous thrombosis) ([facility])    right leg   . Dyspnea    "when I have too much fluid.'  . ESRD (end stage renal disease) ([place])    Tues, Thurs  . GERD (gastroesophageal reflux disease)   . History of blood transfusion 2017  . Pneumonia     Past Surgical History:  Procedure Laterality Date  . APPENDECTOMY  1995  . BASCILIC VEIN TRANSPOSITION Left 01/03/2017   Procedure: FIRST STAGE BRACHIAL VEIN TRANSPOSITION;  Surgeon: Conrad Bowlegs, MD;  Location: [place];  Service: Vascular;  Laterality: Left;  . BASCILIC VEIN TRANSPOSITION Left 04/24/2017   Procedure: LEFT SECOND STAGE BRACHIAL VEIN TRANSPOSITION;  Surgeon: Conrad Merlin, MD;  Location: [place];  Service: Vascular;  Laterality: Left;  . EYE SURGERY    . INSERTION OF DIALYSIS CATHETER Right 01/03/2017   Procedure: INSERTION OF DIALYSIS CATHETER RIGHT INTERNAL JUGULAR;  Surgeon: Conrad Caribou, MD;  Location: [place];  Service: Vascular;   Laterality: Right;  . IR PARACENTESIS  12/14/2017  . IR PARACENTESIS  02/01/2018  . IR PARACENTESIS  03/28/2018  . IR PARACENTESIS  05/17/2018  . IR PARACENTESIS  05/31/2018  . IR PARACENTESIS  06/12/2018  . IR PARACENTESIS  06/26/2018  . IR PARACENTESIS  07/10/2018  . IR PARACENTESIS  07/30/2018  . IR PARACENTESIS  08/30/2018  . IR PARACENTESIS  09/13/2018  . IR PARACENTESIS  09/27/2018  . IR PARACENTESIS  10/11/2018  . IR PARACENTESIS  10/30/2018  . IR PARACENTESIS  11/29/2018  . IR PARACENTESIS  01/22/2019  . IR PARACENTESIS  02/21/2019  . IR PARACENTESIS  03/21/2019  . IR PARACENTESIS  04/02/2019  . IR PARACENTESIS  04/18/2019  . IR PARACENTESIS  05/02/2019  . IR PARACENTESIS  05/16/2019  . IR PARACENTESIS  05/30/2019  . IR PARACENTESIS  06/14/2019  . IR PARACENTESIS  06/27/2019  . IR PARACENTESIS  07/11/2019  . IR PARACENTESIS  07/25/2019  . IR PARACENTESIS  08/06/2019  . IR PARACENTESIS  08/15/2019  . IR PARACENTESIS  08/29/2019  . IR PARACENTESIS  09/19/2019  . IR PARACENTESIS  10/04/2019  . IR PARACENTESIS  10/28/2019  . IR PARACENTESIS  11/14/2019  . IR PARACENTESIS  12/03/2019  . IR PARACENTESIS  12/23/2019  . IR PARACENTESIS  01/03/2020  . IR PARACENTESIS  01/28/2020  . IR PARACENTESIS  02/12/2020  .  IR PARACENTESIS  02/27/2020  . IR PARACENTESIS  03/24/2020  . IR PARACENTESIS  04/09/2020  . IR PARACENTESIS  04/23/2020  . IR PARACENTESIS  05/07/2020  . IR PARACENTESIS  05/21/2020  . IR PARACENTESIS  06/09/2020  . IR PARACENTESIS  06/18/2020  . IR PARACENTESIS  06/30/2020  . IR PARACENTESIS  07/10/2020  . IR PARACENTESIS  07/23/2020  . IR PARACENTESIS  08/04/2020  . IR PARACENTESIS  08/20/2020  . IR PARACENTESIS  09/03/2020  . IR PARACENTESIS  09/15/2020  . IR PARACENTESIS  10/01/2020  . IR PARACENTESIS  10/15/2020  . IR PARACENTESIS  10/29/2020  . IR PARACENTESIS  11/12/2020  . IR PARACENTESIS  11/30/2020  . IR PARACENTESIS  12/17/2020  . IR PARACENTESIS  12/31/2020  . IR PARACENTESIS  01/12/2021   . IR PARACENTESIS  01/26/2021  . IR PARACENTESIS  02/04/2021  . IR PARACENTESIS  02/23/2021  . KNEE SURGERY Left 04/2014  . [place]  Bilateral   . PERICARDIOCENTESIS N/A 01/03/2017   Procedure: Pericardiocentesis;  Surgeon: Burnell Blanks, MD;  Location: [facility];  Service: Cardiovascular;  Laterality: N/A;    Family History  Problem Relation Age of Onset  . Heart disease Other        No family history  . Cancer Brother     Social History   Socioeconomic History  . Marital status: Married    Spouse name: Not on file  . Number of children: 3  . Years of education: Not on file  . Highest education level: Not on file  Occupational History  . Not on file  Tobacco Use  . Smoking status: Never  . Smokeless tobacco: Former    Types: Chew  . Tobacco comments:    no chewing 01/2017  Vaping Use  . Vaping Use: Never used  Substance and Sexual Activity  . Alcohol use: No    Alcohol/week: 0.0 standard drinks  . Drug use: No  . Sexual activity: Yes  Other Topics Concern  . Not on file  Social History Narrative  . Not on file   Social Determinants of Health   Financial Resource Strain: Not on file  Food Insecurity: Not on file  Transportation Needs: Not on file  Physical Activity: Not on file  Stress: Not on file  Social Connections: Not on file  Intimate Partner Violence: Not on file    Outpatient Medications Prior to Visit  Medication Sig Dispense Refill  . acetaminophen (TYLENOL) 500 MG tablet Take 1,000 mg by mouth See admin instructions. Take 2 tablets (1000 mg) by mouth during dialysis on Monday, Wednesday, Friday, take 2 tablets (1000 mg) daily at bedtime    . amLODipine (NORVASC) 10 MG tablet TAKE 1 TABLET BY MOUTH EVERY DAY 90 tablet 3  . aspirin EC 81 MG tablet Take 81 mg by mouth See admin instructions. Take one tablet (81 mg) by mouth every other night    . b complex-vitamin c-folic acid (NEPHRO-VITE) 0.8 MG TABS tablet Take 1 tablet by mouth at  bedtime.   6  . carvedilol (COREG) 25 MG tablet TAKE 1 TABLET BY MOUTH TWICE A DAY WITH FOOD 180 tablet 1  . diphenhydrAMINE (BENADRYL) 25 MG tablet Take 25 mg by mouth See admin instructions. Take 2 tablets (50 mg) by mouth during dialysis on Monday, Wednesday, Friday, take 1-2 tablets (25-50 mg) daily at bedtime    . ferric citrate (AURYXIA) 1 GM 210 MG(Fe) tablet Take 210-420 mg by mouth See  admin instructions. Take 2 tablets (420 mg) by mouth three times daily with meals, take 1 tablet (210 mg) with snacks    . furosemide (LASIX) 80 MG tablet Take 2 tablets (160 mg) by mouth twice daily, may take an additional 1 or 2 tablets (80-160 mg) as needed for bloating 400 tablet 3  . gabapentin (NEURONTIN) 100 MG capsule Take 1-2 capsules (100-200 mg total) by mouth See admin instructions. On Monday, Wednesday, Friday (dialysis days) - take 2 capsules (200 mg) by mouth before dialysis and take 1 capsule (100 mg) at bedtime; on Sunday, Tuesday, Thursday, Saturday (non-dialysis days) - take 1 capsule (100 mg) twice daily 220 capsule 1  . hydrALAZINE (APRESOLINE) 25 MG tablet TAKE 1 TABLET BY MOUTH THREE TIMES A DAY AS DIRECTED 270 tablet 3  . hydrOXYzine (ATARAX/VISTARIL) 25 MG tablet Take 25 mg by mouth 2 (two) times daily.     . isosorbide mononitrate (IMDUR) 30 MG 24 hr tablet Take 1 tablet (30 mg total) by mouth at bedtime. 90 tablet 3  . lidocaine-prilocaine (EMLA) cream APPLY THREE TIMES A WEEK AS DIRECTED 1 HOUR PRIOR TO DIALYSIS AND WRAP WITH PLASTIC WRAP 30 g 3  . LOKELMA 10 g PACK packet SMARTSIG:1 Packet(s) By Mouth 3 Times a Week    . mupirocin ointment (BACTROBAN) 2 % Apply topically 2 (two) times daily. 15 g 1  . omeprazole (PRILOSEC) 40 MG capsule TAKE 1 CAPSULE BY MOUTH EVERY DAY 90 capsule 1  . oxyCODONE-acetaminophen (PERCOCET) 10-325 MG tablet Take 1 tablet by mouth every 8 (eight) hours as needed for pain. 90 tablet 0  . rOPINIRole (REQUIP) 0.25 MG tablet TAKE 2 TABLETS BY MOUTH BEFORE  DIALYSIS ON MON, WED, FRI, & 2 TABS DAILY AT BEDTIME 180 tablet 1  . tamsulosin (FLOMAX) 0.4 MG CAPS capsule TAKE 2 CAPSULES BY MOUTH DAILY AFTER SUPPER 180 capsule 2  . traZODone (DESYREL) 50 MG tablet TAKE 1 TABLET BY MOUTH EVERYDAY AT BEDTIME 90 tablet 0  . TRULICITY 6.50 PT/4.6FK SOPN INJECT 0.75MG SUBCUTANIOUSLY EVERY 7 DAYS 6 mL 6  . Vitamin D, Ergocalciferol, (DRISDOL) 1.25 MG (50000 UNIT) CAPS capsule Take 50,000 Units by mouth every 7 (seven) days.     No facility-administered medications prior to visit.    Allergies  Allergen Reactions  . Prednisone Other (See Comments)    BLINDNESS > [ ? CSCR ? ]  . Tape Other (See Comments)    Paper tape causes blisters   . Simvastatin Other (See Comments)    Myalgia, joint pain  . Lorazepam Anxiety and Other (See Comments)    Nervousness and "fidgets"; legs "need to kick, "electrical currents"  . Methocarbamol Diarrhea  . Seroquel [Quetiapine Fumarate] Other (See Comments)    QT prolongation    ROS Review of Systems    Objective:    Physical Exam Vitals reviewed.  Constitutional:      Appearance: He is well-developed.  HENT:     Head: Normocephalic and atraumatic.  Eyes:     Conjunctiva/sclera: Conjunctivae normal.  Cardiovascular:     Rate and Rhythm: Normal rate and regular rhythm.     Heart sounds: Normal heart sounds.  Pulmonary:     Effort: Pulmonary effort is normal.     Breath sounds: Normal breath sounds.  Skin:    General: Skin is warm and dry.     Coloration: Skin is not pale.  Neurological:     Mental Status: He is alert and oriented to person,  place, and time.  Psychiatric:        Behavior: Behavior normal.   There were no vitals taken for this visit. Wt Readings from Last 3 Encounters:  12/08/20 211 lb (95.7 kg)  09/08/20 210 lb 12.8 oz (95.6 kg)  05/19/20 217 lb 4.8 oz (98.6 kg)     Health Maintenance Due  Topic Date Due  . Pneumococcal Vaccine 71-64 Years old (1 - PCV) Never done  . Zoster  Vaccines- Shingrix (1 of 2) Never done  . OPHTHALMOLOGY EXAM  10/06/2018    There are no preventive care reminders to display for this patient.  Lab Results  Component Value Date   TSH 4.260 10/27/2019   Lab Results  Component Value Date   WBC 5.0 12/14/2019   HGB 10.6 (L) 12/14/2019   HCT 34.3 (L) 12/14/2019   MCV 94.8 12/14/2019   PLT 209 12/14/2019   Lab Results  Component Value Date   NA 138 12/14/2019   K 5.2 (H) 12/14/2019   CO2 27 12/14/2019   GLUCOSE 156 (H) 12/14/2019   BUN 41 (H) 12/14/2019   CREATININE 7.16 (H) 12/14/2019   BILITOT 0.8 10/27/2019   ALKPHOS 69 10/27/2019   AST 18 10/27/2019   ALT 17 10/27/2019   PROT 7.6 10/27/2019   ALBUMIN 3.6 10/27/2019   CALCIUM 8.5 (L) 12/14/2019   ANIONGAP 15 12/14/2019   EGFR 26.0 04/16/2014   Lab Results  Component Value Date   CHOL 115 04/06/2018   Lab Results  Component Value Date   HDL 31 (L) 04/06/2018   Lab Results  Component Value Date   LDLCALC 62 04/06/2018   Lab Results  Component Value Date   TRIG 141 04/06/2018   Lab Results  Component Value Date   CHOLHDL 3.7 04/06/2018   Lab Results  Component Value Date   HGBA1C 7.2 (A) 12/08/2020      Assessment & Plan:   Problem List Items Addressed This Visit       Endocrine   Controlled type 2 diabetes mellitus with renal manifestation ([place]) - Primary     Other   Encounter for chronic pain management    No orders of the defined types were placed in this encounter.   Follow-up: No follow-ups on file.    [name], MD

## 2021-03-10 ENCOUNTER — Other Ambulatory Visit (HOSPITAL_COMMUNITY): Payer: Self-pay | Admitting: Chiropractic Medicine

## 2021-03-10 ENCOUNTER — Other Ambulatory Visit: Payer: Self-pay | Admitting: Chiropractic Medicine

## 2021-03-10 DIAGNOSIS — K7460 Unspecified cirrhosis of liver: Secondary | ICD-10-CM

## 2021-03-10 DIAGNOSIS — E8779 Other fluid overload: Secondary | ICD-10-CM

## 2021-03-11 ENCOUNTER — Other Ambulatory Visit: Payer: Self-pay

## 2021-03-11 ENCOUNTER — Encounter: Payer: Self-pay | Admitting: Student

## 2021-03-11 ENCOUNTER — Ambulatory Visit (HOSPITAL_COMMUNITY)
Admission: RE | Admit: 2021-03-11 | Discharge: 2021-03-11 | Disposition: A | Discharge status: Home / Self Care | Payer: Medicare Other | Source: Ambulatory Visit | Attending: Chiropractic Medicine | Admitting: Chiropractic Medicine

## 2021-03-11 DIAGNOSIS — E8779 Other fluid overload: Secondary | ICD-10-CM | POA: Diagnosis not present

## 2021-03-11 HISTORY — PX: IR PARACENTESIS: IMG2679

## 2021-03-11 MED ORDER — LIDOCAINE HCL 1 % IJ SOLN
INTRAMUSCULAR | Status: AC
  Filled 2021-03-11: qty 20

## 2021-03-11 MED ORDER — LIDOCAINE HCL (PF) 1 % IJ SOLN
INTRAMUSCULAR | Status: DC | PRN
  Administered 2021-03-11: 10 mL

## 2021-03-11 NOTE — Procedures (Signed)
PROCEDURE SUMMARY:  Successful US guided paracentesis from right abdomen.  Yielded 7.5 L of cloudy yellow  fluid.  No immediate complications.  Pt tolerated well.   EBL < 2 mL  Theresa Duty, NP 03/11/2021 12:57 PM

## 2021-03-12 ENCOUNTER — Encounter: Payer: Self-pay | Admitting: Family Medicine

## 2021-03-12 DIAGNOSIS — G8929 Other chronic pain: Secondary | ICD-10-CM

## 2021-03-12 MED ORDER — OXYCODONE-ACETAMINOPHEN 10-325 MG PO TABS: 1.0000 | ORAL_TABLET | Freq: Three times a day (TID) | ORAL | 0 refills | Status: DC | PRN

## 2021-03-12 NOTE — Telephone Encounter (Signed)
Meds ordered this encounter  Medications   oxyCODONE-acetaminophen (PERCOCET) 10-325 MG tablet    Sig: Take 1 tablet by mouth every 8 (eight) hours as needed for pain.    Dispense:  90 tablet    Refill:  0

## 2021-03-23 ENCOUNTER — Ambulatory Visit: Payer: Medicare Other | Admitting: Family Medicine

## 2021-03-24 ENCOUNTER — Other Ambulatory Visit (HOSPITAL_COMMUNITY): Payer: Self-pay | Admitting: Nephrology

## 2021-03-24 ENCOUNTER — Other Ambulatory Visit: Payer: Self-pay | Admitting: Nephrology

## 2021-03-24 DIAGNOSIS — R188 Other ascites: Secondary | ICD-10-CM

## 2021-03-25 ENCOUNTER — Ambulatory Visit (HOSPITAL_COMMUNITY)
Admission: RE | Admit: 2021-03-25 | Discharge: 2021-03-25 | Disposition: A | Discharge status: Home / Self Care | Payer: Medicare Other | Source: Ambulatory Visit | Attending: Nephrology | Admitting: Nephrology

## 2021-03-25 ENCOUNTER — Other Ambulatory Visit: Payer: Self-pay

## 2021-03-25 DIAGNOSIS — R188 Other ascites: Secondary | ICD-10-CM | POA: Diagnosis not present

## 2021-03-25 HISTORY — PX: IR PARACENTESIS: IMG2679

## 2021-03-25 MED ORDER — LIDOCAINE HCL 1 % IJ SOLN
INTRAMUSCULAR | Status: AC
  Filled 2021-03-25: qty 20

## 2021-03-25 MED ORDER — LIDOCAINE HCL (PF) 1 % IJ SOLN
INTRAMUSCULAR | Status: DC | PRN
  Administered 2021-03-25: 10 mL

## 2021-03-26 ENCOUNTER — Other Ambulatory Visit: Payer: Self-pay | Admitting: Family Medicine

## 2021-03-26 ENCOUNTER — Encounter: Payer: Self-pay | Admitting: Family Medicine

## 2021-03-26 MED ORDER — HYDROXYZINE HCL 25 MG PO TABS: 25.0000 mg | ORAL_TABLET | Freq: Every day | ORAL | 0 refills | Status: DC | PRN

## 2021-03-30 ENCOUNTER — Other Ambulatory Visit: Payer: Self-pay

## 2021-03-30 ENCOUNTER — Ambulatory Visit (INDEPENDENT_AMBULATORY_CARE_PROVIDER_SITE_OTHER): Payer: Medicare Other | Admitting: Family Medicine

## 2021-03-30 ENCOUNTER — Encounter: Payer: Self-pay | Admitting: Family Medicine

## 2021-03-30 VITALS — BP 118/54 | HR 62 | Ht 72.0 in | Wt 208.0 lb

## 2021-03-30 DIAGNOSIS — N186 End stage renal disease: Secondary | ICD-10-CM

## 2021-03-30 DIAGNOSIS — Z125 Encounter for screening for malignant neoplasm of prostate: Secondary | ICD-10-CM

## 2021-03-30 DIAGNOSIS — Z992 Dependence on renal dialysis: Secondary | ICD-10-CM

## 2021-03-30 DIAGNOSIS — E1122 Type 2 diabetes mellitus with diabetic chronic kidney disease: Secondary | ICD-10-CM

## 2021-03-30 DIAGNOSIS — G8929 Other chronic pain: Secondary | ICD-10-CM | POA: Diagnosis not present

## 2021-03-30 DIAGNOSIS — E11610 Type 2 diabetes mellitus with diabetic neuropathic arthropathy: Secondary | ICD-10-CM

## 2021-03-30 DIAGNOSIS — R188 Other ascites: Secondary | ICD-10-CM

## 2021-03-30 DIAGNOSIS — Z794 Long term (current) use of insulin: Secondary | ICD-10-CM

## 2021-03-30 LAB — POCT GLYCOSYLATED HEMOGLOBIN (HGB A1C): Hemoglobin A1C: 6.6 % — AB (ref 4.0–5.6)

## 2021-03-30 MED ORDER — OXYCODONE-ACETAMINOPHEN 10-325 MG PO TABS: 1.0000 | ORAL_TABLET | Freq: Three times a day (TID) | ORAL | 0 refills | Status: DC | PRN

## 2021-03-30 NOTE — Assessment & Plan Note (Signed)
Refill oxycodone today.PDMP reviewed. Indication for chronic opioid:Chronic back pain and Charcot foot. Medication and dose:Percocet 10/325 # pills per month: 90 Last UDS date:09/08/2020 Opioid Treatment Agreement signed (Y/N):Y Opioid Treatment Agreement last reviewed with patient:  NCCSRS reviewed this encounter (include red flags):Yes

## 2021-03-30 NOTE — Assessment & Plan Note (Signed)
So has not gotten new fitted boot.  Still wearing his old one he has a lot of cuts and scratches on his left lower leg.  He says has been trying to do a lot around the house.

## 2021-03-30 NOTE — Progress Notes (Signed)
Established Patient Office Visit  Subjective:  Patient ID: James Burke, male    DOB: 06/17/1966  Age: 55 y.o. MRN: [id number]  CC:  Chief Complaint  Patient presents with   Follow-up    3 month follow up DM    HPI James Burke presents for   Diabetes - no hypoglycemic events. No wounds or sores that are not healing well. No increased thirst or urination. Checking glucose at home. Taking medications as prescribed without any side effects. Occ forgets to take his Trulicity.    F/U Chronic Pain management - stable on his current regimen of oxycodone which he takes for his leg and Charcot foot. He is doing well with medication.    He had a do an extra session of dialysis at the beginning of the month due to fluid overload.  He still having to get paracentesis about every 2 weeks on average.  He says when he starts to feel really full with fluid he has a decreased appetite.  Past Medical History:  Diagnosis Date   Anemia    Arthritis, septic, knee (HCC)    C. difficile colitis 04/18/2008   Congestive heart failure ([place])    Diabetes mellitus    Type II   DVT (deep venous thrombosis) (HCC)    right leg    Dyspnea    "when I have too much fluid.'   ESRD (end stage renal disease) ([place])    Tues, Thurs   GERD (gastroesophageal reflux disease)    History of blood transfusion 2017   Pneumonia     Past Surgical History:  Procedure Laterality Date   APPENDECTOMY  5427   BASCILIC VEIN TRANSPOSITION Left 01/03/2017   Procedure: FIRST STAGE [place];  Surgeon: Conrad Johnsonburg, MD;  Location: [facility];  Service: Vascular;  Laterality: Left;   [place] Left 04/24/2017   Procedure: LEFT SECOND [place];  Surgeon: Conrad Shungnak, MD;  Location: [facility];  Service: Vascular;  Laterality: Left;   [place] Right 01/03/2017   Procedure: INSERTION OF DIALYSIS CATHETER RIGHT INTERNAL JUGULAR;  Surgeon:  Conrad [employer], MD;  Location: [facility];  Service: Vascular;  Laterality: Right;   IR PARACENTESIS  12/14/2017   IR PARACENTESIS  02/01/2018   IR PARACENTESIS  03/28/2018   IR PARACENTESIS  05/17/2018   IR PARACENTESIS  05/31/2018   IR PARACENTESIS  06/12/2018   IR PARACENTESIS  06/26/2018   IR PARACENTESIS  07/10/2018   IR PARACENTESIS  07/30/2018   IR PARACENTESIS  08/30/2018   IR PARACENTESIS  09/13/2018   IR PARACENTESIS  09/27/2018   IR PARACENTESIS  10/11/2018   IR PARACENTESIS  10/30/2018   IR PARACENTESIS  11/29/2018   IR PARACENTESIS  01/22/2019   IR PARACENTESIS  02/21/2019   IR PARACENTESIS  03/21/2019   IR PARACENTESIS  04/02/2019   IR PARACENTESIS  04/18/2019   IR PARACENTESIS  05/02/2019   IR PARACENTESIS  05/16/2019   IR PARACENTESIS  05/30/2019   IR PARACENTESIS  06/14/2019   IR PARACENTESIS  06/27/2019   IR PARACENTESIS  07/11/2019   IR PARACENTESIS  07/25/2019   IR PARACENTESIS  08/06/2019   IR PARACENTESIS  08/15/2019   IR PARACENTESIS  08/29/2019   IR PARACENTESIS  09/19/2019   IR PARACENTESIS  10/04/2019   IR PARACENTESIS  10/28/2019   IR PARACENTESIS  11/14/2019   IR PARACENTESIS  12/03/2019  IR PARACENTESIS  12/23/2019   IR PARACENTESIS  01/03/2020   IR PARACENTESIS  01/28/2020   IR PARACENTESIS  02/12/2020   IR PARACENTESIS  02/27/2020   IR PARACENTESIS  03/24/2020   IR PARACENTESIS  04/09/2020   IR PARACENTESIS  04/23/2020   IR PARACENTESIS  05/07/2020   IR PARACENTESIS  05/21/2020   IR PARACENTESIS  06/09/2020   IR PARACENTESIS  06/18/2020   IR PARACENTESIS  06/30/2020   IR PARACENTESIS  07/10/2020   IR PARACENTESIS  07/23/2020   IR PARACENTESIS  08/04/2020   IR PARACENTESIS  08/20/2020   IR PARACENTESIS  09/03/2020   IR PARACENTESIS  09/15/2020   IR PARACENTESIS  10/01/2020   IR PARACENTESIS  10/15/2020   IR PARACENTESIS  10/29/2020   IR PARACENTESIS  11/12/2020   IR PARACENTESIS  11/30/2020   IR PARACENTESIS  12/17/2020   IR PARACENTESIS  12/31/2020   IR PARACENTESIS  01/12/2021   IR  PARACENTESIS  01/26/2021   IR PARACENTESIS  02/04/2021   IR PARACENTESIS  02/23/2021   IR PARACENTESIS  03/11/2021   IR PARACENTESIS  03/25/2021   KNEE SURGERY Left 04/2014   Lazer  Bilateral    PERICARDIOCENTESIS N/A 01/03/2017   Procedure: Pericardiocentesis;  Surgeon: Burnell Blanks, MD;  Location: [facility];  Service: Cardiovascular;  Laterality: N/A;    Family History  Problem Relation Age of Onset   Heart disease Other        No family history   Cancer Brother     Social History   Socioeconomic History   Marital status: Married    Spouse name: Not on file   Number of children: 3   Years of education: Not on file   Highest education level: Not on file  Occupational History   Not on file  Tobacco Use   Smoking status: Never   Smokeless tobacco: Former    Types: Chew   Tobacco comments:    no chewing 01/2017  Vaping Use   Vaping Use: Never used  Substance and Sexual Activity   Alcohol use: No    Alcohol/week: 0.0 standard drinks   Drug use: No   Sexual activity: Yes  Other Topics Concern   Not on file  Social History Narrative   Not on file   Social Determinants of Health   Financial Resource Strain: Not on file  Food Insecurity: Not on file  Transportation Needs: Not on file  Physical Activity: Not on file  Stress: Not on file  Social Connections: Not on file  Intimate Partner Violence: Not on file    Outpatient Medications Prior to Visit  Medication Sig Dispense Refill   acetaminophen (TYLENOL) 500 MG tablet Take 1,000 mg by mouth See admin instructions. Take 2 tablets (1000 mg) by mouth during dialysis on Monday, Wednesday, Friday, take 2 tablets (1000 mg) daily at bedtime     amLODipine (NORVASC) 10 MG tablet TAKE 1 TABLET BY MOUTH EVERY DAY 90 tablet 3   aspirin EC 81 MG tablet Take 81 mg by mouth See admin instructions. Take one tablet (81 mg) by mouth every other night     b complex-vitamin c-folic acid (NEPHRO-VITE) 0.8 MG TABS tablet  Take 1 tablet by mouth at bedtime.   6   CALCITRIOL PO Take by mouth.     carvedilol (COREG) 25 MG tablet TAKE 1 TABLET BY MOUTH TWICE A DAY WITH FOOD 180 tablet 1   diphenhydrAMINE (BENADRYL) 25 MG tablet Take 25  mg by mouth See admin instructions. Take 2 tablets (50 mg) by mouth during dialysis on Monday, Wednesday, Friday, take 1-2 tablets (25-50 mg) daily at bedtime     ferric citrate (AURYXIA) 1 GM 210 MG(Fe) tablet Take 210-420 mg by mouth See admin instructions. Take 2 tablets (420 mg) by mouth three times daily with meals, take 1 tablet (210 mg) with snacks     furosemide (LASIX) 80 MG tablet Take 2 tablets (160 mg) by mouth twice daily, may take an additional 1 or 2 tablets (80-160 mg) as needed for bloating 400 tablet 3   hydrALAZINE (APRESOLINE) 25 MG tablet TAKE 1 TABLET BY MOUTH THREE TIMES A DAY AS DIRECTED 270 tablet 3   hydrOXYzine (ATARAX/VISTARIL) 25 MG tablet Take 1-2 tablets (25-50 mg total) by mouth daily as needed. 180 tablet 0   iron sucrose in sodium chloride 0.9 % 100 mL Iron Sucrose (Venofer)     isosorbide mononitrate (IMDUR) 30 MG 24 hr tablet Take 1 tablet (30 mg total) by mouth at bedtime. 90 tablet 3   lidocaine-prilocaine (EMLA) cream APPLY THREE TIMES A WEEK AS DIRECTED 1 HOUR PRIOR TO DIALYSIS AND WRAP WITH PLASTIC WRAP 30 g 3   LOKELMA 10 g PACK packet SMARTSIG:1 Packet(s) By Mouth 3 Times a Week     Methoxy PEG-Epoetin Beta (MIRCERA IJ) Mircera     mupirocin ointment (BACTROBAN) 2 % APPLY TOPICALLY TWICE A DAY 22 g 1   omeprazole (PRILOSEC) 40 MG capsule TAKE 1 CAPSULE BY MOUTH EVERY DAY 90 capsule 1   rOPINIRole (REQUIP) 0.25 MG tablet TAKE 2 TABLETS BY MOUTH BEFORE DIALYSIS ON MON, WED, FRI, & 2 TABS DAILY AT BEDTIME 180 tablet 1   tamsulosin (FLOMAX) 0.4 MG CAPS capsule TAKE 2 CAPSULES BY MOUTH DAILY AFTER SUPPER 180 capsule 2   traZODone (DESYREL) 50 MG tablet TAKE 1 TABLET BY MOUTH EVERYDAY AT BEDTIME 90 tablet 0   TRULICITY 5.64 PP/2.9JJ SOPN INJECT 0.75MG  SUBCUTANIOUSLY EVERY 7 DAYS 6 mL 6   Vitamin D, Ergocalciferol, (DRISDOL) 1.25 MG (50000 UNIT) CAPS capsule Take 50,000 Units by mouth every 7 (seven) days.     oxyCODONE-acetaminophen (PERCOCET) 10-325 MG tablet Take 1 tablet by mouth every 8 (eight) hours as needed for pain. 90 tablet 0   gabapentin (NEURONTIN) 100 MG capsule Take 1-2 capsules (100-200 mg total) by mouth See admin instructions. On Monday, Wednesday, Friday (dialysis days) - take 2 capsules (200 mg) by mouth before dialysis and take 1 capsule (100 mg) at bedtime; on Sunday, Tuesday, Thursday, Saturday (non-dialysis days) - take 1 capsule (100 mg) twice daily 220 capsule 1   No facility-administered medications prior to visit.    Allergies  Allergen Reactions   Prednisone Other (See Comments)    BLINDNESS > [ ? CSCR ? ]   Tape Other (See Comments)    Paper tape causes blisters    Simvastatin Other (See Comments)    Myalgia, joint pain   Lorazepam Anxiety and Other (See Comments)    Nervousness and "fidgets"; legs "need to kick, "electrical currents"   Methocarbamol Diarrhea   Seroquel [Quetiapine Fumarate] Other (See Comments)    QT prolongation    ROS Review of Systems    Objective:    Physical Exam Constitutional:      Appearance: Normal appearance. He is well-developed.  HENT:     Head: Normocephalic and atraumatic.  Cardiovascular:     Rate and Rhythm: Normal rate and regular rhythm.  Heart sounds: Normal heart sounds.  Pulmonary:     Effort: Pulmonary effort is normal.     Breath sounds: Normal breath sounds.  Skin:    General: Skin is warm and dry.  Neurological:     Mental Status: He is alert and oriented to person, place, and time. Mental status is at baseline.  Psychiatric:        Behavior: Behavior normal.    BP (!) 118/54   Pulse 62   Ht 6' (1.829 m)   Wt 208 lb (94.3 kg)   SpO2 98%   BMI 28.21 kg/m  Wt Readings from Last 3 Encounters:  03/30/21 208 lb (94.3 kg)  12/08/20 211 lb  (95.7 kg)  09/08/20 210 lb 12.8 oz (95.6 kg)     Health Maintenance Due  Topic Date Due   Zoster Vaccines- Shingrix (1 of 2) Never done   Pneumococcal Vaccine 73-94 Years old (3 - PPSV23 or PCV20) 07/20/2017   OPHTHALMOLOGY EXAM  10/06/2018    There are no preventive care reminders to display for this patient.  Lab Results  Component Value Date   TSH 4.260 10/27/2019   Lab Results  Component Value Date   WBC 5.0 12/14/2019   HGB 10.6 (L) 12/14/2019   HCT 34.3 (L) 12/14/2019   MCV 94.8 12/14/2019   PLT 209 12/14/2019   Lab Results  Component Value Date   NA 138 12/14/2019   K 5.2 (H) 12/14/2019   CO2 27 12/14/2019   GLUCOSE 156 (H) 12/14/2019   BUN 41 (H) 12/14/2019   CREATININE 7.16 (H) 12/14/2019   BILITOT 0.8 10/27/2019   ALKPHOS 69 10/27/2019   AST 18 10/27/2019   ALT 17 10/27/2019   PROT 7.6 10/27/2019   ALBUMIN 3.6 10/27/2019   CALCIUM 8.5 (L) 12/14/2019   ANIONGAP 15 12/14/2019   EGFR 26.0 04/16/2014   Lab Results  Component Value Date   CHOL 115 04/06/2018   Lab Results  Component Value Date   HDL 31 (L) 04/06/2018   Lab Results  Component Value Date   LDLCALC 62 04/06/2018   Lab Results  Component Value Date   TRIG 141 04/06/2018   Lab Results  Component Value Date   CHOLHDL 3.7 04/06/2018   Lab Results  Component Value Date   HGBA1C 6.6 (A) 03/30/2021      Assessment & Plan:   Problem List Items Addressed This Visit       Endocrine   Type 2 diabetes mellitus with diabetic chronic kidney disease ([place]) - Primary (Chronic)    A1c actually looks better today at 6.6 which is great.  Though he reports he has been occasionally missing his Trulicity but knows he needs to get really back on track with that.  Continue to work on [employer] choices he has gained some weight this year.       Relevant Orders   POCT HgB A1C (Completed)   Lipid panel   PSA   Charcot foot due to diabetes mellitus (HCC) (Chronic)    So has not gotten new  fitted boot.  Still wearing his old one he has a lot of cuts and scratches on his left lower leg.  He says has been trying to do a lot around the house.       Relevant Medications   oxyCODONE-acetaminophen (PERCOCET) 10-325 MG tablet (Start on 04/11/2021)   Controlled type 2 diabetes mellitus with renal manifestation ([place])    .Well controlled. Continue current regimen.  Follow up in  4 mo . A1C 6.6         Genitourinary   ESRD (end stage renal disease) on dialysis (HCC) (Chronic)    No recent changes with his dialysis that he did have to have an extra session a couple of weeks ago.  We did discuss maybe getting some additional labs or really need to get an up-to-date lipid and PSA on him.         Other   Ascites (Chronic)    Paracentesis every 2 weeks.       Encounter for chronic pain management    Refill oxycodone today. PDMP reviewed.   Indication for chronic opioid: Chronic back pain and Charcot foot. Medication and dose: Percocet 10/325 # pills per month: 90 Last UDS date: 09/08/2020       Opioid Treatment Agreement signed (Y/N): Y Opioid Treatment Agreement last reviewed with patient:   NCCSRS reviewed this encounter (include red flags):  Yes       Relevant Medications   oxyCODONE-acetaminophen (PERCOCET) 10-325 MG tablet (Start on 04/11/2021)   Other Visit Diagnoses     Screening for prostate cancer       Relevant Orders   PSA       Meds ordered this encounter  Medications   oxyCODONE-acetaminophen (PERCOCET) 10-325 MG tablet    Sig: Take 1 tablet by mouth every 8 (eight) hours as needed for pain.    Dispense:  90 tablet    Refill:  0     Follow-up: Return in about 4 months (around 07/31/2021) for Diabetes follow-up.    [name], MD

## 2021-03-30 NOTE — Assessment & Plan Note (Signed)
A1c actually looks better today at 6.6 which is great.  Though he reports he has been occasionally missing his Trulicity but knows he needs to get really back on track with that.  Continue to work on [employer] choices he has gained some weight this year.

## 2021-03-30 NOTE — Assessment & Plan Note (Signed)
Paracentesis every 2 weeks.

## 2021-03-30 NOTE — Assessment & Plan Note (Signed)
No recent changes with his dialysis that he did have to have an extra session a couple of weeks ago.  We did discuss maybe getting some additional labs or really need to get an up-to-date lipid and PSA on him.

## 2021-03-30 NOTE — Assessment & Plan Note (Signed)
.  Well controlled. Continue current regimen. Follow up in  4 mo . A1C 6.6

## 2021-04-06 ENCOUNTER — Other Ambulatory Visit (HOSPITAL_COMMUNITY): Payer: Self-pay | Admitting: Nephrology

## 2021-04-06 DIAGNOSIS — R188 Other ascites: Secondary | ICD-10-CM

## 2021-04-08 ENCOUNTER — Other Ambulatory Visit: Payer: Self-pay

## 2021-04-08 ENCOUNTER — Ambulatory Visit (HOSPITAL_COMMUNITY)
Admission: RE | Admit: 2021-04-08 | Discharge: 2021-04-08 | Disposition: A | Discharge status: Home / Self Care | Payer: Medicare Other | Source: Ambulatory Visit | Attending: Nephrology | Admitting: Nephrology

## 2021-04-08 DIAGNOSIS — R188 Other ascites: Secondary | ICD-10-CM | POA: Diagnosis not present

## 2021-04-08 HISTORY — PX: IR PARACENTESIS: IMG2679

## 2021-04-08 MED ORDER — LIDOCAINE HCL 1 % IJ SOLN
INTRAMUSCULAR | Status: AC
  Filled 2021-04-08: qty 20

## 2021-04-08 NOTE — Procedures (Signed)
PROCEDURE SUMMARY:  Successful US guided paracentesis from right abdomen.  Yielded 6.7 L of clear yellow fluid.  No immediate complications.  Pt tolerated well.   EBL < 2 mL  Theresa Duty, NP 04/08/2021 3:45 PM

## 2021-04-15 ENCOUNTER — Other Ambulatory Visit (HOSPITAL_COMMUNITY): Payer: Self-pay | Admitting: Nephrology

## 2021-04-15 ENCOUNTER — Other Ambulatory Visit: Payer: Self-pay | Admitting: Nephrology

## 2021-04-15 DIAGNOSIS — R188 Other ascites: Secondary | ICD-10-CM

## 2021-04-20 ENCOUNTER — Other Ambulatory Visit: Payer: Self-pay

## 2021-04-20 ENCOUNTER — Ambulatory Visit (HOSPITAL_COMMUNITY)
Admission: RE | Admit: 2021-04-20 | Discharge: 2021-04-20 | Disposition: A | Discharge status: Home / Self Care | Payer: Medicare Other | Source: Ambulatory Visit | Attending: Nephrology | Admitting: Nephrology

## 2021-04-20 DIAGNOSIS — R188 Other ascites: Secondary | ICD-10-CM | POA: Insufficient documentation

## 2021-04-20 HISTORY — PX: IR PARACENTESIS: IMG2679

## 2021-04-20 MED ORDER — LIDOCAINE HCL (PF) 1 % IJ SOLN
INTRAMUSCULAR | Status: AC | PRN
Start: 2021-04-20 — End: 2021-04-20
  Administered 2021-04-20: 10 mL

## 2021-04-20 MED ORDER — LIDOCAINE HCL 1 % IJ SOLN
INTRAMUSCULAR | Status: AC
  Filled 2021-04-20: qty 20

## 2021-04-20 NOTE — Procedures (Signed)
PROCEDURE SUMMARY:  Successful image-guided paracentesis from the right lower abdomen.  Yielded 6.7 liters of hazy yellow fluid.  No immediate complications.  EBL = trace. Patient tolerated well.   Specimen was not sent for labs.  Please see imaging section of Epic for full dictation.   Armando Gang Juris Gosnell PA-C 04/20/2021 2:19 PM

## 2021-05-04 ENCOUNTER — Encounter: Payer: Self-pay | Admitting: Family Medicine

## 2021-05-04 ENCOUNTER — Other Ambulatory Visit: Payer: Self-pay | Admitting: Nephrology

## 2021-05-04 DIAGNOSIS — R188 Other ascites: Secondary | ICD-10-CM

## 2021-05-05 ENCOUNTER — Encounter: Payer: Self-pay | Admitting: Family Medicine

## 2021-05-06 ENCOUNTER — Other Ambulatory Visit: Payer: Self-pay

## 2021-05-06 ENCOUNTER — Ambulatory Visit (HOSPITAL_COMMUNITY)
Admission: RE | Admit: 2021-05-06 | Discharge: 2021-05-06 | Disposition: A | Discharge status: Home / Self Care | Payer: Medicare Other | Source: Ambulatory Visit | Attending: Nephrology | Admitting: Nephrology

## 2021-05-06 DIAGNOSIS — R188 Other ascites: Secondary | ICD-10-CM | POA: Diagnosis not present

## 2021-05-06 HISTORY — PX: IR PARACENTESIS: IMG2679

## 2021-05-06 MED ORDER — LIDOCAINE HCL (PF) 1 % IJ SOLN
INTRAMUSCULAR | Status: DC | PRN
  Administered 2021-05-06: 10 mL

## 2021-05-06 MED ORDER — LIDOCAINE HCL 1 % IJ SOLN
INTRAMUSCULAR | Status: AC
  Filled 2021-05-06: qty 20

## 2021-05-06 NOTE — Procedures (Signed)
PROCEDURE SUMMARY:  Successful US guided paracentesis from right lateral abdomen.  Yielded 6.7 liters of hazy, yellow fluid.  No immediate complications.  Pt tolerated well.   Specimen was not sent for labs.  EBL < 29mL  Docia Barrier PA-C 05/06/2021 1:45 PM

## 2021-05-11 ENCOUNTER — Encounter: Payer: Self-pay | Admitting: Family Medicine

## 2021-05-11 DIAGNOSIS — G8929 Other chronic pain: Secondary | ICD-10-CM

## 2021-05-11 MED ORDER — OXYCODONE-ACETAMINOPHEN 10-325 MG PO TABS: 1.0000 | ORAL_TABLET | Freq: Three times a day (TID) | ORAL | 0 refills | Status: DC | PRN

## 2021-05-11 NOTE — Telephone Encounter (Signed)
Last RX for #90 on 04/11/2021.  Charyl Bigger, CMA

## 2021-05-17 ENCOUNTER — Other Ambulatory Visit: Payer: Self-pay | Admitting: Nephrology

## 2021-05-17 ENCOUNTER — Other Ambulatory Visit (HOSPITAL_COMMUNITY): Payer: Self-pay | Admitting: Nephrology

## 2021-05-17 DIAGNOSIS — E8770 Fluid overload, unspecified: Secondary | ICD-10-CM

## 2021-05-18 ENCOUNTER — Other Ambulatory Visit: Payer: Self-pay

## 2021-05-18 ENCOUNTER — Ambulatory Visit (HOSPITAL_COMMUNITY)
Admission: RE | Admit: 2021-05-18 | Discharge: 2021-05-18 | Disposition: A | Discharge status: Home / Self Care | Payer: Medicare Other | Source: Ambulatory Visit | Attending: Nephrology | Admitting: Nephrology

## 2021-05-18 DIAGNOSIS — E8770 Fluid overload, unspecified: Secondary | ICD-10-CM

## 2021-05-18 DIAGNOSIS — R188 Other ascites: Secondary | ICD-10-CM | POA: Diagnosis not present

## 2021-05-18 HISTORY — PX: IR PARACENTESIS: IMG2679

## 2021-05-18 MED ORDER — LIDOCAINE HCL 1 % IJ SOLN
INTRAMUSCULAR | Status: AC
  Filled 2021-05-18: qty 20

## 2021-05-18 MED ORDER — LIDOCAINE HCL (PF) 1 % IJ SOLN
INTRAMUSCULAR | Status: DC | PRN
  Administered 2021-05-18: 10 mL

## 2021-05-18 NOTE — Procedures (Signed)
PROCEDURE SUMMARY:  Successful image-guided paracentesis from the right lower abdomen.  Yielded 6.5 liters of opaque, light yellow fluid.  No immediate complications.  EBL < 1 mL Patient tolerated well.   Specimen was not sent for labs.  Please see imaging section of Epic for full dictation.  Joaquim Nam PA-C 05/18/2021 9:44 AM

## 2021-05-25 ENCOUNTER — Other Ambulatory Visit: Payer: Self-pay | Admitting: Family Medicine

## 2021-06-01 ENCOUNTER — Other Ambulatory Visit (HOSPITAL_COMMUNITY): Payer: Self-pay | Admitting: Nephrology

## 2021-06-01 DIAGNOSIS — R188 Other ascites: Secondary | ICD-10-CM

## 2021-06-03 ENCOUNTER — Other Ambulatory Visit: Payer: Self-pay

## 2021-06-03 ENCOUNTER — Ambulatory Visit (HOSPITAL_COMMUNITY)
Admission: RE | Admit: 2021-06-03 | Discharge: 2021-06-03 | Disposition: A | Discharge status: Home / Self Care | Payer: Medicare Other | Source: Ambulatory Visit | Attending: Nephrology | Admitting: Nephrology

## 2021-06-03 DIAGNOSIS — R188 Other ascites: Secondary | ICD-10-CM | POA: Insufficient documentation

## 2021-06-03 HISTORY — PX: IR PARACENTESIS: IMG2679

## 2021-06-03 MED ORDER — LIDOCAINE HCL (PF) 1 % IJ SOLN
INTRAMUSCULAR | Status: DC | PRN
  Administered 2021-06-03: 10 mL

## 2021-06-03 NOTE — Procedures (Signed)
PROCEDURE SUMMARY:  Successful US guided paracentesis from right abdomen.  Yielded 5.4 L of hazy yellow fluid.  No immediate complications.  Pt tolerated well.   EBL < 2 mL  Theresa Duty, NP 06/03/2021 10:29 AM

## 2021-06-06 ENCOUNTER — Other Ambulatory Visit: Payer: Self-pay | Admitting: Family Medicine

## 2021-06-11 ENCOUNTER — Other Ambulatory Visit: Payer: Self-pay | Admitting: Family Medicine

## 2021-06-11 ENCOUNTER — Encounter: Payer: Self-pay | Admitting: Family Medicine

## 2021-06-11 DIAGNOSIS — G8929 Other chronic pain: Secondary | ICD-10-CM

## 2021-06-11 MED ORDER — OXYCODONE-ACETAMINOPHEN 10-325 MG PO TABS: 1.0000 | ORAL_TABLET | Freq: Three times a day (TID) | ORAL | 0 refills | Status: AC | PRN

## 2021-06-13 ENCOUNTER — Encounter: Payer: Self-pay | Admitting: Family Medicine

## 2021-06-14 MED ORDER — AMLODIPINE BESYLATE 10 MG PO TABS: 10.0000 mg | ORAL_TABLET | Freq: Every day | ORAL | 0 refills | Status: AC

## 2021-06-15 ENCOUNTER — Encounter (HOSPITAL_COMMUNITY): Payer: Self-pay | Admitting: Physician Assistant

## 2021-06-15 ENCOUNTER — Ambulatory Visit (HOSPITAL_COMMUNITY)
Admission: RE | Admit: 2021-06-15 | Discharge: 2021-06-15 | Disposition: A | Discharge status: Home / Self Care | Payer: Medicare Other | Source: Ambulatory Visit | Attending: Nephrology | Admitting: Nephrology

## 2021-06-15 ENCOUNTER — Other Ambulatory Visit: Payer: Self-pay

## 2021-06-15 DIAGNOSIS — R188 Other ascites: Secondary | ICD-10-CM | POA: Insufficient documentation

## 2021-06-15 HISTORY — PX: IR PARACENTESIS: IMG2679

## 2021-06-15 MED ORDER — LIDOCAINE HCL 1 % IJ SOLN
INTRAMUSCULAR | Status: AC
  Filled 2021-06-15: qty 20

## 2021-06-15 MED ORDER — LIDOCAINE HCL (PF) 1 % IJ SOLN
INTRAMUSCULAR | Status: DC | PRN
  Administered 2021-06-15: 10 mL

## 2021-06-15 NOTE — Procedures (Signed)
PROCEDURE SUMMARY:  Successful US guided paracentesis from RLQ.  Yielded 5.5L of yellow fluid.  No immediate complications.  Pt tolerated well.   Specimen not sent for labs.  EBL < 76mL  Chicquita Mendel PA-C 06/15/2021 2:02 PM

## 2021-06-20 ENCOUNTER — Other Ambulatory Visit: Payer: Self-pay | Admitting: Family Medicine

## 2021-06-29 ENCOUNTER — Other Ambulatory Visit (HOSPITAL_COMMUNITY): Payer: Self-pay | Admitting: Nephrology

## 2021-06-29 DIAGNOSIS — R188 Other ascites: Secondary | ICD-10-CM

## 2021-07-01 ENCOUNTER — Other Ambulatory Visit: Payer: Self-pay

## 2021-07-01 ENCOUNTER — Ambulatory Visit (HOSPITAL_COMMUNITY)
Admission: RE | Admit: 2021-07-01 | Discharge: 2021-07-01 | Disposition: A | Discharge status: Home / Self Care | Payer: Medicare Other | Source: Ambulatory Visit | Attending: Nephrology | Admitting: Nephrology

## 2021-07-01 DIAGNOSIS — R188 Other ascites: Secondary | ICD-10-CM | POA: Diagnosis present

## 2021-07-01 HISTORY — PX: IR PARACENTESIS: IMG2679

## 2021-07-01 MED ORDER — LIDOCAINE HCL (PF) 1 % IJ SOLN
INTRAMUSCULAR | Status: DC | PRN
  Administered 2021-07-01: 10 mL

## 2021-07-01 MED ORDER — LIDOCAINE HCL 1 % IJ SOLN
INTRAMUSCULAR | Status: AC
  Filled 2021-07-01: qty 20

## 2021-07-04 ENCOUNTER — Telehealth: Payer: Self-pay | Admitting: Sports Medicine

## 2021-07-06 ENCOUNTER — Other Ambulatory Visit: Payer: Self-pay | Admitting: Family Medicine

## 2021-07-06 NOTE — Telephone Encounter (Signed)
Received a call from [place], EMS and police on the scene at [employer], this is a patient with multiple medical problems including end-stage renal disease, heart failure, diabetes, found dead at home, time of death 07/24/21 8:30 in the morning.  I have advised that I will route this to PCP for [place] [facility] death certificate signing.  Per paramedics nothing else concerning noted at the scene to suggest anything other than natural causes.

## 2021-07-06 DEATH — deceased

## 2021-07-07 ENCOUNTER — Encounter: Payer: Self-pay | Admitting: Sports Medicine

## 2021-07-07 NOTE — Telephone Encounter (Signed)
Death Certificate signed in [place]

## 2021-08-03 ENCOUNTER — Ambulatory Visit: Payer: Medicare Other | Admitting: Family Medicine
